# Patient Record
Sex: Female | Born: 1985 | Race: White | Hispanic: No | Marital: Single | Smoking: Current every day smoker
Health system: Southern US, Community
[De-identification: ages and names within clinical notes are randomized; demographics above are authoritative.]

## PROBLEM LIST (undated history)

## (undated) ENCOUNTER — Emergency Department: Payer: Medicaid Other | Source: Home / Self Care

## (undated) DIAGNOSIS — F329 Major depressive disorder, single episode, unspecified: Secondary | ICD-10-CM

## (undated) DIAGNOSIS — F32A Depression, unspecified: Secondary | ICD-10-CM

## (undated) DIAGNOSIS — F419 Anxiety disorder, unspecified: Secondary | ICD-10-CM

## (undated) DIAGNOSIS — T148XXA Other injury of unspecified body region, initial encounter: Secondary | ICD-10-CM

## (undated) DIAGNOSIS — G43909 Migraine, unspecified, not intractable, without status migrainosus: Secondary | ICD-10-CM

## (undated) DIAGNOSIS — F319 Bipolar disorder, unspecified: Secondary | ICD-10-CM

## (undated) HISTORY — DX: Depression, unspecified: F32.A

## (undated) HISTORY — DX: Bipolar disorder, unspecified: F31.9

## (undated) HISTORY — DX: Anxiety disorder, unspecified: F41.9

## (undated) HISTORY — DX: Major depressive disorder, single episode, unspecified: F32.9

## (undated) HISTORY — DX: Other injury of unspecified body region, initial encounter: T14.8XXA

## (undated) HISTORY — DX: Migraine, unspecified, not intractable, without status migrainosus: G43.909

---

## 2005-02-05 ENCOUNTER — Emergency Department: Payer: Self-pay | Admitting: General Practice

## 2009-09-01 ENCOUNTER — Emergency Department: Payer: Self-pay | Admitting: Internal Medicine

## 2009-10-17 ENCOUNTER — Emergency Department: Payer: Self-pay | Admitting: Emergency Medicine

## 2010-01-03 ENCOUNTER — Emergency Department: Payer: Self-pay | Admitting: Emergency Medicine
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
## 2010-01-05 ENCOUNTER — Emergency Department: Payer: Self-pay | Admitting: Internal Medicine

## 2011-02-06 ENCOUNTER — Emergency Department: Payer: Self-pay | Admitting: Emergency Medicine

## 2013-06-10 ENCOUNTER — Emergency Department: Payer: Self-pay | Admitting: Emergency Medicine
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
## 2014-04-03 ENCOUNTER — Inpatient Hospital Stay: Payer: Self-pay | Admitting: Psychiatry

## 2014-04-03 LAB — URINALYSIS, COMPLETE
BILIRUBIN, UR: NEGATIVE
BLOOD: NEGATIVE
Bacteria: NONE SEEN
Glucose,UR: NEGATIVE mg/dL (ref 0–75)
KETONE: NEGATIVE
LEUKOCYTE ESTERASE: NEGATIVE
Nitrite: NEGATIVE
PROTEIN: NEGATIVE
Ph: 5 (ref 4.5–8.0)
Specific Gravity: 1.016 (ref 1.003–1.030)
Squamous Epithelial: 1

## 2014-04-03 LAB — COMPREHENSIVE METABOLIC PANEL
ALBUMIN: 4.1 g/dL (ref 3.4–5.0)
ALT: 41 U/L
AST: 24 U/L (ref 15–37)
Alkaline Phosphatase: 113 U/L
Anion Gap: 8 (ref 7–16)
BUN: 7 mg/dL (ref 7–18)
Bilirubin,Total: 0.3 mg/dL (ref 0.2–1.0)
CO2: 26 mmol/L (ref 21–32)
CREATININE: 0.75 mg/dL (ref 0.60–1.30)
Calcium, Total: 8.5 mg/dL (ref 8.5–10.1)
Chloride: 106 mmol/L (ref 98–107)
EGFR (African American): >60
EGFR (Non-African Amer.): >60
GLUCOSE: 105 mg/dL — AB (ref 65–99)
OSMOLALITY: 278 (ref 275–301)
Potassium: 3.6 mmol/L (ref 3.5–5.1)
SODIUM: 140 mmol/L (ref 136–145)
TOTAL PROTEIN: 7.8 g/dL (ref 6.4–8.2)

## 2014-04-03 LAB — CBC
HCT: 43.4 % (ref 35.0–47.0)
HGB: 14.7 g/dL (ref 12.0–16.0)
MCH: 31.1 pg (ref 26.0–34.0)
MCHC: 33.8 g/dL (ref 32.0–36.0)
MCV: 92 fL (ref 80–100)
Platelet: 211 10*3/uL (ref 150–440)
RBC: 4.72 10*6/uL (ref 3.80–5.20)
RDW: 13.1 % (ref 11.5–14.5)
WBC: 8.4 10*3/uL (ref 3.6–11.0)

## 2014-04-03 LAB — DRUG SCREEN, URINE
Amphetamines, Ur Screen: NEGATIVE (ref ?–1000)
Barbiturates, Ur Screen: POSITIVE (ref ?–200)
Benzodiazepine, Ur Scrn: POSITIVE (ref ?–200)
CANNABINOID 50 NG, UR ~~LOC~~: NEGATIVE (ref ?–50)
Cocaine Metabolite,Ur ~~LOC~~: NEGATIVE (ref ?–300)
MDMA (Ecstasy)Ur Screen: NEGATIVE (ref ?–500)
METHADONE, UR SCREEN: NEGATIVE (ref ?–300)
Opiate, Ur Screen: POSITIVE (ref ?–300)
Phencyclidine (PCP) Ur S: NEGATIVE (ref ?–25)
Tricyclic, Ur Screen: NEGATIVE (ref ?–1000)

## 2014-04-03 LAB — SALICYLATE LEVEL: Salicylates, Serum: 4.4 mg/dL — ABNORMAL HIGH

## 2014-04-03 LAB — ETHANOL

## 2014-04-03 LAB — ACETAMINOPHEN LEVEL: Acetaminophen: <2 ug/mL
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
## 2015-01-22 ENCOUNTER — Encounter: Payer: Self-pay | Admitting: Obstetrics and Gynecology

## 2015-01-22 ENCOUNTER — Ambulatory Visit (INDEPENDENT_AMBULATORY_CARE_PROVIDER_SITE_OTHER): Payer: Self-pay | Admitting: Obstetrics and Gynecology

## 2015-01-22 VITALS — BP 105/70 | HR 89 | Resp 16 | Ht 64.0 in | Wt 157.9 lb

## 2015-01-22 DIAGNOSIS — T8332XA Displacement of intrauterine contraceptive device, initial encounter: Secondary | ICD-10-CM

## 2015-01-22 DIAGNOSIS — T8389XA Other specified complication of genitourinary prosthetic devices, implants and grafts, initial encounter: Secondary | ICD-10-CM

## 2015-01-22 NOTE — Progress Notes (Signed)
GYNECOLOGY PROGRESS NOTE  Subjective:    Patient ID: Leslie Molina, female    DOB: 1985/12/26, 29 y.o.   MRN: [id number]  HPI  Patient is a 29 y.o. P74 female who presents for establishment of care and IUD removal.  IUD was placed ~ 7 years ago (in [place]).  Reports h/o difficulty with insertion.  LMP unknown as patient with h/o sparsely occuring menses with IUD.  Denies alternative method of contraception once IUD removed and will maintain abstinence.     Past Medical History  Diagnosis Date  . Bipolar 1 disorder   . Anxiety   . Depression   . Migraine   . Nerve damage     Past Surgical History  Procedure Laterality Date  . Cesarean section (x 3)     Social History  Substance Use Topics  . Smoking status: Current Every Day Smoker -- 1.00 packs/day    Types: Cigarettes  . Smokeless tobacco: Not on file  . Alcohol Use: No   Outpatient Encounter Prescriptions as of 01/22/2015  Medication Sig Note  . ALPRAZolam (XANAX) 0.5 MG tablet Take 0.5 mg by mouth 3 (three) times daily as needed. 01/22/2015: Received from: External Pharmacy  . ALPRAZolam (XANAX) 1 MG tablet Take 1 mg by mouth 3 (three) times daily. 01/22/2015: Received from: External Pharmacy  . butalbital-acetaminophen-caffeine (FIORICET, ESGIC) 50-325-40 MG per tablet Take 1 tablet by mouth every 4 (four) hours as needed. 01/22/2015: Received from: External Pharmacy  . gabapentin (NEURONTIN) 300 MG capsule Take 300 mg by mouth 3 (three) times daily. 01/22/2015: Received from: External Pharmacy  . hydrOXYzine (VISTARIL) 50 MG capsule TAKE 2 CAPSULES EACH NIGHT AT BEDTIME 01/22/2015: Received from: External Pharmacy  . LATUDA 20 MG TABS Take 1 tablet by mouth daily. 01/22/2015: Received from: External Pharmacy  . QVAR 80 MCG/ACT inhaler INHALE 2 PUFFS EVERY 4 TO 6 HOURS AS NEEDED 01/22/2015: Received from: External Pharmacy  . SEROQUEL XR 150 MG 24 hr tablet Take 150 mg by mouth every evening. 01/22/2015: Received from: External  Pharmacy  . simvastatin (ZOCOR) 40 MG tablet Take 40 mg by mouth daily. 01/22/2015: Received from: External Pharmacy  . SPIRIVA HANDIHALER 18 MCG inhalation capsule INHALE 1 CAPSULE VIA HANDIHALER ONCE DAILY AT THE SAME TIME EVERY DAY 01/22/2015: Received from: External Pharmacy  . SUMAtriptan (IMITREX) 50 MG tablet Take 50 mg by mouth See admin instructions. 01/22/2015: Received from: External Pharmacy  . topiramate (TOPAMAX) 100 MG tablet TAKE 1 TABLET BY MOUTH AT BEDTIME FOR HEADACHE PREVENTION 01/22/2015: Received from: External Pharmacy  . traMADol (ULTRAM) 50 MG tablet Take 50 mg by mouth 2 (two) times daily as needed. 01/22/2015: Received from: External Pharmacy  . traZODone (DESYREL) 150 MG tablet Take 150 mg by mouth at bedtime. 01/22/2015: Received from: External Pharmacy   No facility-administered encounter medications on file as of 01/22/2015.    Allergies  Allergen Reactions  . Ibuprofen Hives    Review of Systems A comprehensive review of systems was negative except for: Integument/breast: positive for pruritus, rash and skin lesion(s)   Objective:   Blood pressure 105/70, pulse 89, resp. rate 16, height 5\' 4"  (1.626 m), weight 157 lb 14.4 oz (71.623 kg). General appearance: alert and no distress  Skin: excoriation - arm(s) bilateral, elbow(s) bilateral, knee(s) bilateral, lower leg(s) bilateral and hyperpigmentation - elbow(s) bilateral, knee(s) bilateral with areas of patchy, scaly skin and lichenification.  Abdomen: soft, non-tender; bowel sounds normal; no masses,  no organomegaly Pelvic: external genitalia normal  and vagina normal without discharge.  Cervix visible, no lesions.  No IUD strings identified.  Uterus normal shape, size, consistency.  Extremities: extremities normal, atraumatic, no cyanosis or edema Neurologic: Grossly normal   Assessment:   IUD threads lost Skin lesions (suspicious for psoriasis)  Plan:   Unable to retrieve IUD threads with Boseman  forceps, endocervical cytobrush, IUD retrieval hook with no success.  Will order pelvic ultrasound to locate IUD with strings.  Depending on location, may be able to remove under ultrasound guidance, otherwise may have to locate under hysteroscopic guidance. RTC in 1-2 weeks for ultrasound and MD visit.  Patient desires annual exam.  Will schedule for later date.     IUD Removal Procedure Note Patient identified, informed consent performed, verbal consent obtained.   Patient was in the dorsal lithotomy position, normal external genitalia was noted.  A speculum was placed in the patient's vagina, normal discharge was noted, no lesions. The cervix was visualized, no lesions, no abnormal discharge.  The strings of the IUD were The strings of the IUD were not visualized, so Boseman forceps were introduced into the endometrial cavity.  IUD strings still not retrieved, so successive attempts with the endocervical cytobrush and the IUD retreival hook were made.  IUD threads still not retrieved.  Procedure was discontinued at this time.  Patient tolerated the procedure without difficulty up to this point.      Rubie Maid, MD [facility]

## 2015-01-23 ENCOUNTER — Other Ambulatory Visit: Payer: Medicaid Other

## 2015-01-28 ENCOUNTER — Encounter: Payer: Self-pay | Admitting: *Deleted

## 2015-02-05 ENCOUNTER — Ambulatory Visit: Payer: Medicaid Other | Admitting: Obstetrics and Gynecology

## 2016-04-14 ENCOUNTER — Ambulatory Visit: Payer: Medicaid Other | Admitting: Obstetrics and Gynecology

## 2019-01-16 ENCOUNTER — Emergency Department
Admission: EM | Admit: 2019-01-16 | Discharge: 2019-01-16 | Disposition: A | Discharge status: Home / Self Care | Payer: Medicaid Other | Attending: Student in an Organized Health Care Education/Training Program | Admitting: Student in an Organized Health Care Education/Training Program

## 2019-01-16 ENCOUNTER — Encounter: Payer: Self-pay | Admitting: Emergency Medicine

## 2019-01-16 DIAGNOSIS — Y929 Unspecified place or not applicable: Secondary | ICD-10-CM | POA: Diagnosis not present

## 2019-01-16 DIAGNOSIS — S41112A Laceration without foreign body of left upper arm, initial encounter: Secondary | ICD-10-CM | POA: Insufficient documentation

## 2019-01-16 DIAGNOSIS — W548XXA Other contact with dog, initial encounter: Secondary | ICD-10-CM | POA: Diagnosis not present

## 2019-01-16 DIAGNOSIS — Y9389 Activity, other specified: Secondary | ICD-10-CM | POA: Insufficient documentation

## 2019-01-16 DIAGNOSIS — Y999 Unspecified external cause status: Secondary | ICD-10-CM | POA: Insufficient documentation

## 2019-01-16 DIAGNOSIS — Z5321 Procedure and treatment not carried out due to patient leaving prior to being seen by health care provider: Secondary | ICD-10-CM | POA: Insufficient documentation

## 2019-01-16 NOTE — ED Triage Notes (Signed)
Pt fell while playing with dogs this evening and a nail lacerated her left upper arm, approx. 3 inch. New bandage applied, no fatty tissue seen on assessment.

## 2019-01-30 ENCOUNTER — Other Ambulatory Visit: Payer: Self-pay

## 2019-01-30 ENCOUNTER — Emergency Department
Admission: EM | Admit: 2019-01-30 | Discharge: 2019-01-30 | Disposition: A | Discharge status: Home / Self Care | Payer: Medicaid Other | Attending: Emergency Medicine | Admitting: Emergency Medicine

## 2019-01-30 ENCOUNTER — Emergency Department: Payer: Medicaid Other

## 2019-01-30 DIAGNOSIS — Z79899 Other long term (current) drug therapy: Secondary | ICD-10-CM | POA: Diagnosis not present

## 2019-01-30 DIAGNOSIS — W208XXA Other cause of strike by thrown, projected or falling object, initial encounter: Secondary | ICD-10-CM | POA: Insufficient documentation

## 2019-01-30 DIAGNOSIS — S6991XA Unspecified injury of right wrist, hand and finger(s), initial encounter: Secondary | ICD-10-CM | POA: Diagnosis present

## 2019-01-30 DIAGNOSIS — Y990 Civilian activity done for income or pay: Secondary | ICD-10-CM | POA: Insufficient documentation

## 2019-01-30 DIAGNOSIS — Y939 Activity, unspecified: Secondary | ICD-10-CM | POA: Diagnosis not present

## 2019-01-30 DIAGNOSIS — S60221A Contusion of right hand, initial encounter: Secondary | ICD-10-CM | POA: Insufficient documentation

## 2019-01-30 DIAGNOSIS — Y929 Unspecified place or not applicable: Secondary | ICD-10-CM | POA: Insufficient documentation

## 2019-01-30 DIAGNOSIS — F17210 Nicotine dependence, cigarettes, uncomplicated: Secondary | ICD-10-CM | POA: Insufficient documentation

## 2019-01-30 IMAGING — DX DG HAND COMPLETE 3+V*R*
3 series · 3 of 3 positions shown · non-contrast
Comparison: [DATE]

CLINICAL DATA: Pain

EXAM:
RIGHT HAND - COMPLETE 3+ VIEW

[hand ap]
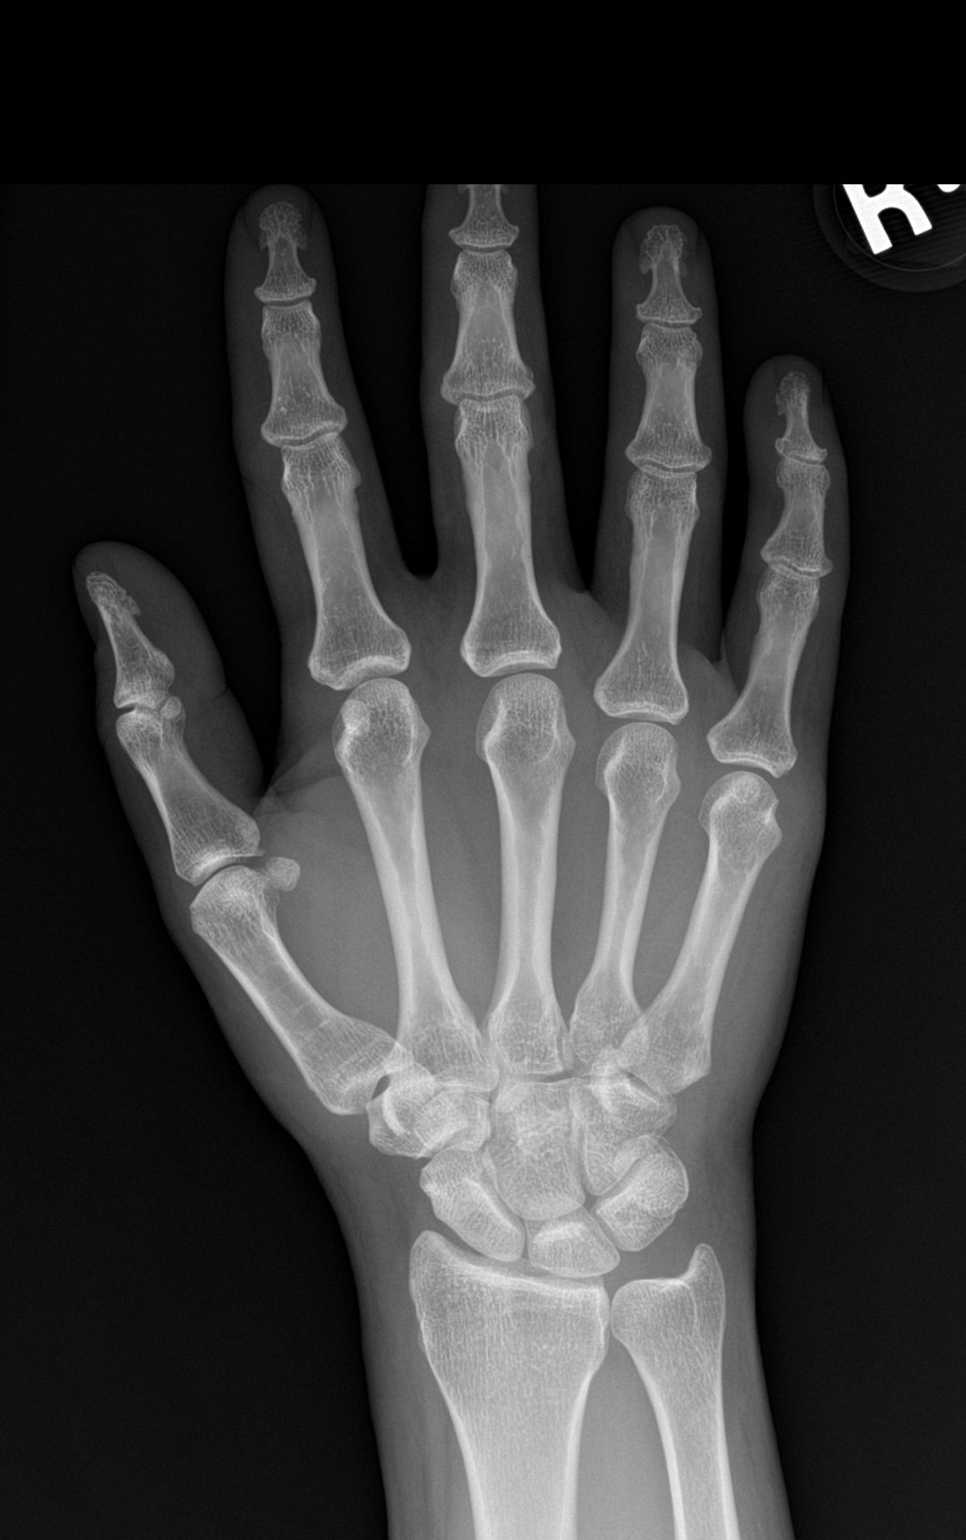

[hand obl]
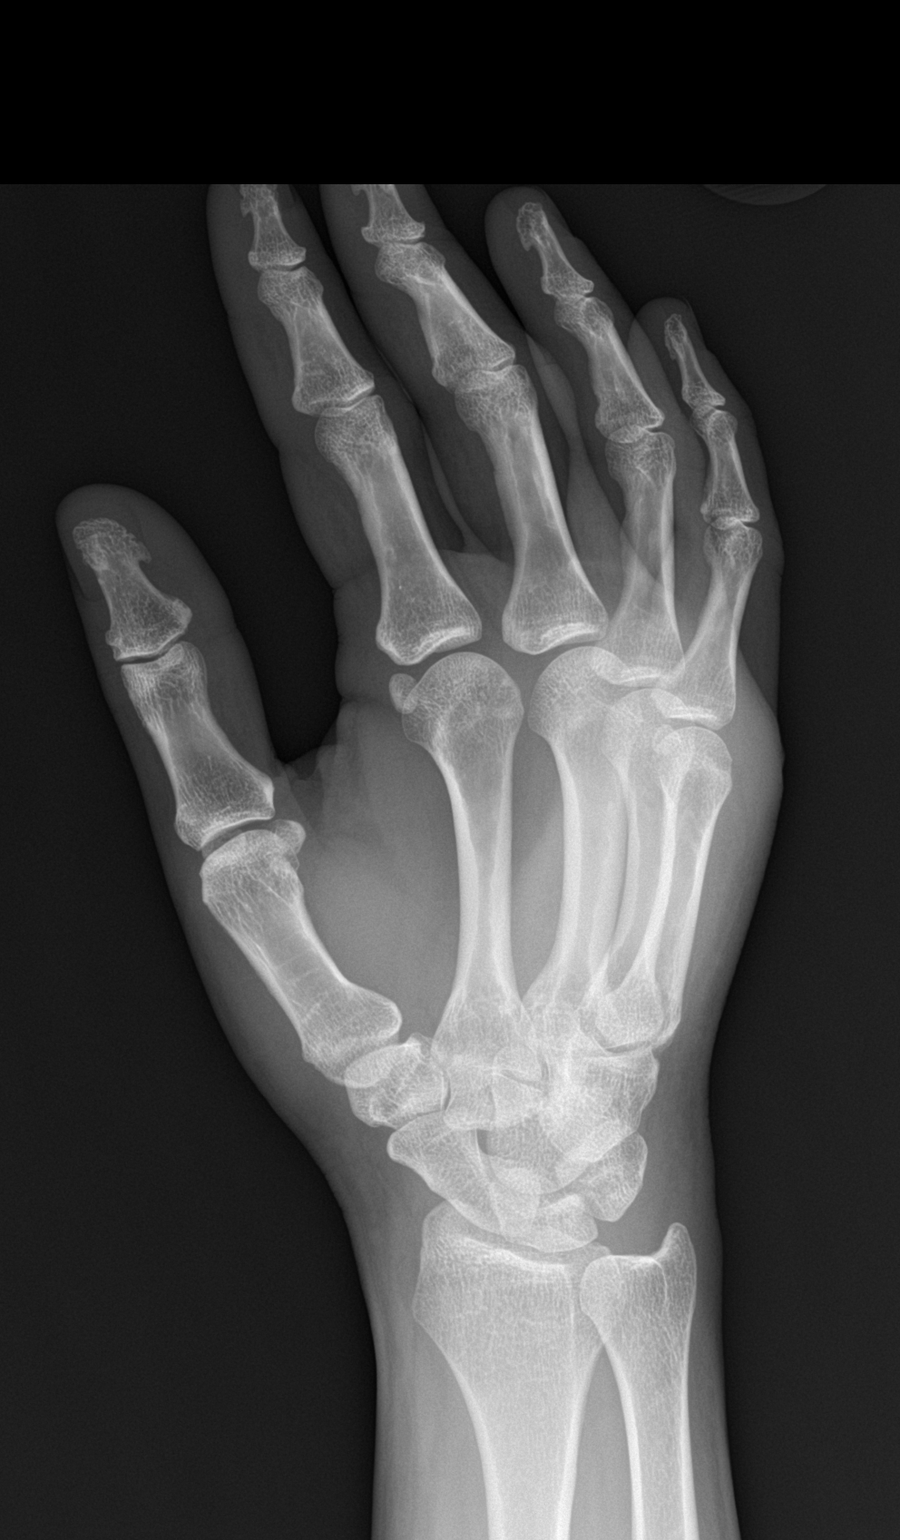

[hand lat]
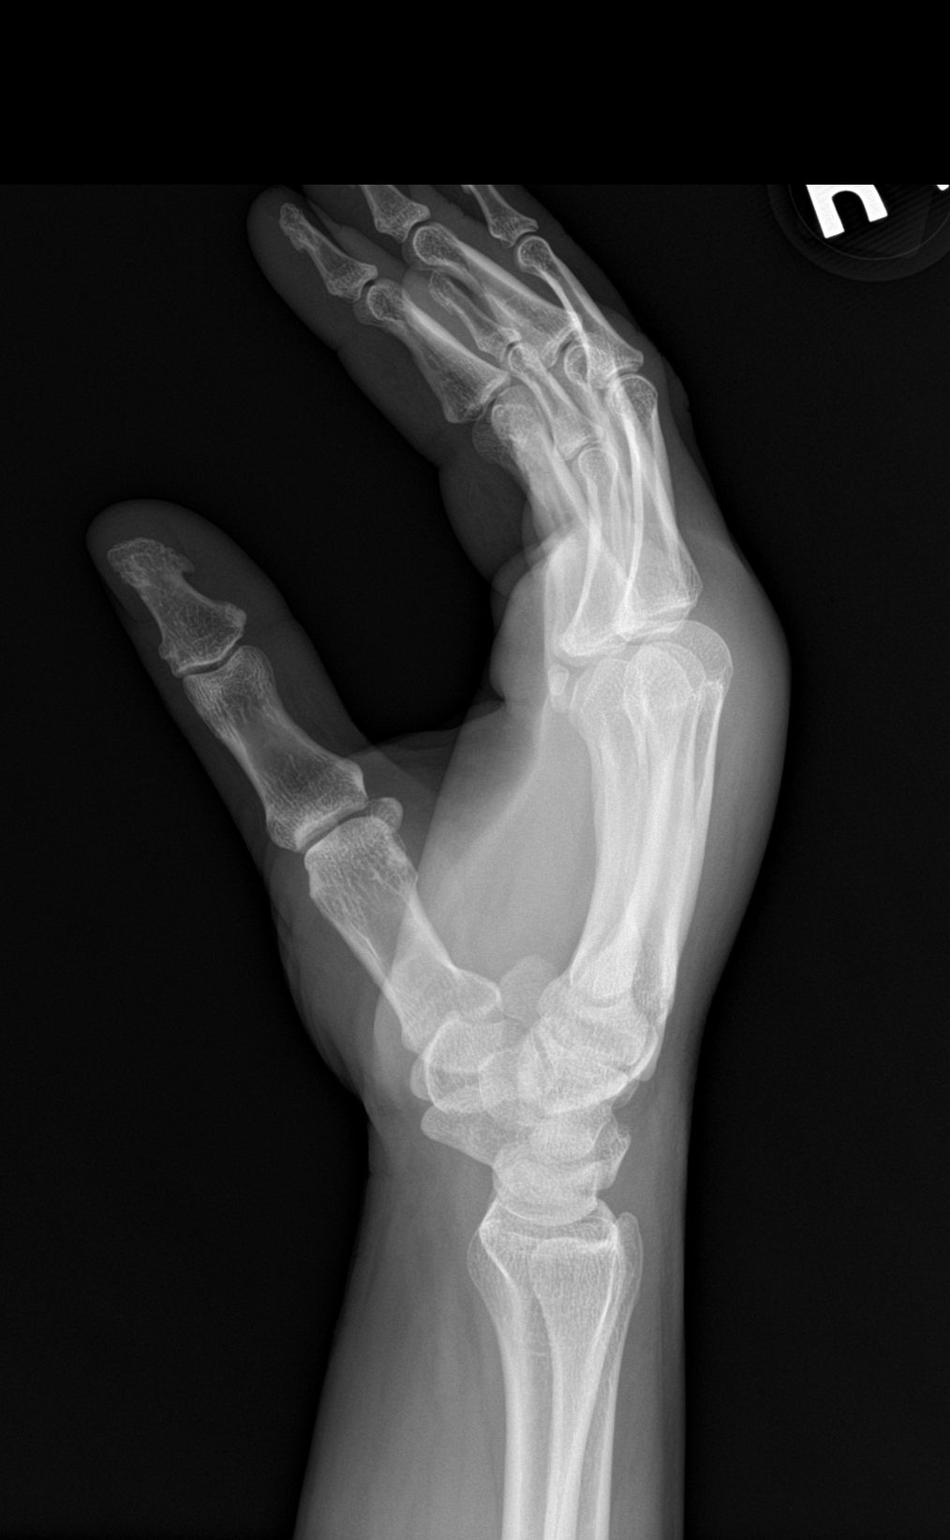

[3 of 3 positions shown; findings below may reference images not displayed]

FINDINGS: There is no acute displaced fracture or dislocation. There is soft
tissue swelling about the dorsal aspect of the hand. There is no
radiopaque foreign body.
IMPRESSION: Soft tissue swelling without evidence for an acute displaced
fracture or dislocation.

## 2019-01-30 MED ORDER — HYDROCODONE-ACETAMINOPHEN 5-325 MG PO TABS
1.0000 | ORAL_TABLET | Freq: Once | ORAL | Status: AC
  Administered 2019-01-30: 1 via ORAL
  Filled 2019-01-30: qty 1

## 2019-01-30 MED ORDER — PREDNISONE 20 MG PO TABS: 20.0000 mg | ORAL_TABLET | Freq: Two times a day (BID) | ORAL | 0 refills | Status: AC

## 2019-01-30 MED ORDER — HYDROCODONE-ACETAMINOPHEN 5-325 MG PO TABS
1.0000 | ORAL_TABLET | Freq: Three times a day (TID) | ORAL | 0 refills | Status: AC | PRN
Start: 2019-01-30 — End: 2019-02-01

## 2019-01-30 NOTE — ED Notes (Signed)
See triage note  Presents with pain to right hand  States she dropped something on hand  No deformity noted

## 2019-01-30 NOTE — Discharge Instructions (Signed)
You have a hand contusion without fracture or dislocation. Wear the ace bandage for protection and comfort. Apply ice to reduce swelling. Take the prescription anti-inflammatory for pain and inflammation.

## 2019-01-30 NOTE — ED Triage Notes (Signed)
Reports that she dropped approx 20 lbs on right hand today. Swelling and pain to right hand present. Pt alert and oriented X4, cooperative, RR even and unlabored, color WNL. Pt in NAD.

## 2019-01-30 NOTE — ED Provider Notes (Signed)
[facility] Emergency Department Provider Note ____________________________________________  Time seen: 1655  I have reviewed the triage vital signs and the nursing notes.  HISTORY  Chief Complaint  Hand Injury  HPI Leslie Molina is a 33 y.o. female presents with self to the ED for evaluation and pain.  She describes dropping a 20-pound box of frozen french fries onto the right hand today.  She continued to work after the injury. She presents with pain and swelling to the hand.  She denies any other injury at this time.   Past Medical History:  Diagnosis Date  . Anxiety   . Bipolar 1 disorder ([place])   . Depression   . Migraine   . Nerve damage     Patient Active Problem List   Diagnosis Date Noted  . IUD migration ([place]) 01/22/2015    Past Surgical History:  Procedure Laterality Date  . CESAREAN SECTION      Prior to Admission medications   Medication Sig Start Date End Date Taking? Authorizing Provider  ALPRAZolam Duanne Moron) 0.5 MG tablet Take 0.5 mg by mouth 3 (three) times daily as needed. 12/26/14   [provider]  ALPRAZolam Duanne Moron) 1 MG tablet Take 1 mg by mouth 3 (three) times daily. 10/24/14   [provider]  butalbital-acetaminophen-caffeine (FIORICET, ESGIC) 50-325-40 MG per tablet Take 1 tablet by mouth every 4 (four) hours as needed. 12/26/14   [provider]  gabapentin (NEURONTIN) 300 MG capsule Take 300 mg by mouth 3 (three) times daily. 01/14/15   [provider]  HYDROcodone-acetaminophen (NORCO) 5-325 MG tablet Take 1 tablet by mouth 3 (three) times daily as needed for up to 2 days. 01/30/19 02/01/19  Alexsis Branscom, Dannielle Karvonen, PA-C  hydrOXYzine (VISTARIL) 50 MG capsule TAKE 2 CAPSULES EACH NIGHT AT BEDTIME 12/08/14   [provider]  LATUDA 20 MG TABS Take 1 tablet by mouth daily. 12/26/14   [provider]  predniSONE (DELTASONE) 20 MG tablet Take 1 tablet (20 mg total) by mouth 2 (two) times  daily with a meal for 5 days. 01/30/19 02/04/19  Everley Evora, Dannielle Karvonen, PA-C  QVAR 80 MCG/ACT inhaler INHALE 2 PUFFS EVERY 4 TO 6 HOURS AS NEEDED 12/08/14   [provider]  SEROQUEL XR 150 MG 24 hr tablet Take 150 mg by mouth every evening. 12/27/14   [provider]  simvastatin (ZOCOR) 40 MG tablet Take 40 mg by mouth daily. 12/08/14   [provider]  SPIRIVA HANDIHALER 18 MCG inhalation capsule INHALE 1 CAPSULE VIA HANDIHALER ONCE DAILY AT THE SAME TIME EVERY DAY 12/08/14   [provider]  SUMAtriptan (IMITREX) 50 MG tablet Take 50 mg by mouth See admin instructions. 11/29/14   [provider]  topiramate (TOPAMAX) 100 MG tablet TAKE 1 TABLET BY MOUTH AT BEDTIME FOR HEADACHE PREVENTION 12/08/14   [provider]  traMADol (ULTRAM) 50 MG tablet Take 50 mg by mouth 2 (two) times daily as needed. 12/26/14   [provider]  traZODone (DESYREL) 150 MG tablet Take 150 mg by mouth at bedtime. 12/27/14   [provider]    Allergies Ibuprofen  Family History  Problem Relation Age of Onset  . Depression Mother   . Bipolar disorder Mother   . Depression Father   . Bipolar disorder Father   . Depression Sister   . Bipolar disorder Sister     Social History Social History   Tobacco Use  . Smoking status: Current  Every Day Smoker    Packs/day: 1.00    Types: Cigarettes  . Smokeless tobacco: Never Used  Substance Use Topics  . Alcohol use: No    Alcohol/week: 0.0 standard drinks  . Drug use: No    Review of Systems  Constitutional: Negative for fever. Cardiovascular: Negative for chest pain. Respiratory: Negative for shortness of breath. Musculoskeletal: Negative for back pain.  Right hand pain as above. Skin: Negative for rash. Neurological: Negative for headaches, focal weakness or numbness. ____________________________________________  PHYSICAL EXAM:  VITAL SIGNS: ED Triage Vitals  Enc Vitals Group     BP  01/30/19 1632 116/78     Pulse Rate 01/30/19 1632 91     Resp --      Temp 01/30/19 1632 98.4 F (36.9 C)     Temp Source 01/30/19 1632 Oral     SpO2 01/30/19 1632 96 %     Weight 01/30/19 1633 167 lb (75.8 kg)     Height 01/30/19 1633 5\' 1"  (1.549 m)     Head Circumference --      Peak Flow --      Pain Score 01/30/19 1637 10     Pain Loc --      Pain Edu? --      Excl. in [place]? --     Constitutional: Alert and oriented. Well appearing and in no distress. Head: Normocephalic and atraumatic. Eyes: Conjunctivae are normal. Normal extraocular movements Cardiovascular: Normal rate, regular rhythm. Normal distal pulses. Respiratory: Normal respiratory effort. No wheezes/rales/rhonchi. Gastrointestinal: Soft and nontender. No distention. Musculoskeletal: Right hand with obvious dorsal soft tissue swelling noted.  No overlying abrasion or laceration is appreciated.  Patient with normal composite fist on exam.  Flexion of the fingers does elicit some pain.  Nontender with normal range of motion in all extremities.  Neurologic:  Normal gross sensation.  Normal intrinsic and opposition testing noted.  Normal speech and language. No gross focal neurologic deficits are appreciated. Skin:  Skin is warm, dry and intact. No rash noted. ____________________________________________   RADIOLOGY  DG Right Hand  negative ____________________________________________  PROCEDURES  Norco 5-325 mg PO Ace bandage Procedures ____________________________________________  INITIAL IMPRESSION / ASSESSMENT AND PLAN / ED COURSE  Patient with ED evaluation of right hand pain after a 20 pound box of frozen [place] fries fell on the hand.  Patient clinical picture is consistent with a hand contusion with soft tissue swelling and probable hematoma noted dorsally.  X-rays are negative for any acute fractures or dislocation.  The right hand is Ace bandage for support and protection.  Patient is discharged with  prescriptions for ibuprofen and #6 hydrocodone.  A work note is provided for 1 day as requested.  Leslie Molina was evaluated in Emergency Department on 01/30/2019 for the symptoms described in the history of present illness. She was evaluated in the context of the global COVID-19 pandemic, which necessitated consideration that the patient might be at risk for infection with the SARS-CoV-2 virus that causes COVID-19. Institutional protocols and algorithms that pertain to the evaluation of patients at risk for COVID-19 are in a state of rapid change based on information released by regulatory bodies including the CDC and federal and state organizations. These policies and algorithms were followed during the patient's care in the ED. ____________________________________________  FINAL CLINICAL IMPRESSION(S) / ED DIAGNOSES  Final diagnoses:  Contusion of right hand, initial encounter      Melvenia Needles, PA-C 01/30/19 1914  Carrie Mew, MD 01/31/19 [phone]

## 2019-07-29 ENCOUNTER — Other Ambulatory Visit: Payer: Self-pay

## 2019-07-29 ENCOUNTER — Emergency Department
Admission: EM | Admit: 2019-07-29 | Discharge: 2019-07-29 | Disposition: A | Discharge status: Home / Self Care | Payer: Medicaid Other | Attending: Emergency Medicine | Admitting: Emergency Medicine

## 2019-07-29 DIAGNOSIS — M5441 Lumbago with sciatica, right side: Secondary | ICD-10-CM | POA: Diagnosis not present

## 2019-07-29 DIAGNOSIS — Z79899 Other long term (current) drug therapy: Secondary | ICD-10-CM | POA: Insufficient documentation

## 2019-07-29 DIAGNOSIS — Z1152 Encounter for screening for COVID-19: Secondary | ICD-10-CM

## 2019-07-29 DIAGNOSIS — G8929 Other chronic pain: Secondary | ICD-10-CM

## 2019-07-29 DIAGNOSIS — F17210 Nicotine dependence, cigarettes, uncomplicated: Secondary | ICD-10-CM | POA: Diagnosis not present

## 2019-07-29 DIAGNOSIS — B349 Viral infection, unspecified: Secondary | ICD-10-CM | POA: Diagnosis not present

## 2019-07-29 DIAGNOSIS — Z20822 Contact with and (suspected) exposure to covid-19: Secondary | ICD-10-CM | POA: Insufficient documentation

## 2019-07-29 DIAGNOSIS — M545 Low back pain: Secondary | ICD-10-CM | POA: Diagnosis present

## 2019-07-29 LAB — URINALYSIS, COMPLETE (UACMP) WITH MICROSCOPIC
Bacteria, UA: NONE SEEN
Bilirubin Urine: NEGATIVE
Glucose, UA: NEGATIVE mg/dL
Hgb urine dipstick: NEGATIVE
Ketones, ur: NEGATIVE mg/dL
Leukocytes,Ua: NEGATIVE
Nitrite: NEGATIVE
Protein, ur: NEGATIVE mg/dL
Specific Gravity, Urine: 1.008 (ref 1.005–1.030)
pH: 6 (ref 5.0–8.0)

## 2019-07-29 LAB — SARS CORONAVIRUS 2 (TAT 6-24 HRS): SARS Coronavirus 2: NEGATIVE

## 2019-07-29 MED ORDER — TRAMADOL HCL 50 MG PO TABS: 50.0000 mg | ORAL_TABLET | Freq: Four times a day (QID) | ORAL | 2 refills | Status: DC | PRN

## 2019-07-29 MED ORDER — ACETAMINOPHEN 500 MG PO TABS
1000.0000 mg | ORAL_TABLET | Freq: Once | ORAL | Status: AC
  Administered 2019-07-29: 10:00:00 1000 mg via ORAL
  Filled 2019-07-29: qty 2

## 2019-07-29 MED ORDER — GABAPENTIN 300 MG PO CAPS: 300.0000 mg | ORAL_CAPSULE | Freq: Three times a day (TID) | ORAL | 1 refills | Status: DC

## 2019-07-29 MED ORDER — OXYCODONE HCL 5 MG PO TABS
5.0000 mg | ORAL_TABLET | Freq: Once | ORAL | Status: AC
  Administered 2019-07-29: 11:00:00 5 mg via ORAL
  Filled 2019-07-29: qty 1

## 2019-07-29 NOTE — Discharge Instructions (Signed)
Follow-up with your primary care or establish a primary care provider as you indicated that you were trying to do.  A list of other clinics was added to your discharge instructions.  A prescription for gabapentin and tramadol was sent to your pharmacy.  Also take Tylenol as needed for fever.  Quarantine until you have heard the results of your Covid testing.  If this test is positive you will need to quarantine additional 10 days.  Increase fluids.

## 2019-07-29 NOTE — ED Triage Notes (Signed)
Pt arrived via EMS from home d/t lower back pain that radiates down her right leg. Pt reports hx of lower back pain but this is a lot different and more painful. Pt is A&Om x4 at this time.

## 2019-07-29 NOTE — ED Provider Notes (Signed)
[facility] Emergency Department Provider Note  ____________________________________________   First MD Initiated Contact with Patient 07/29/19 [phone]     (approximate)  I have reviewed the triage vital signs and the nursing notes.   HISTORY  Chief Complaint Back Pain   HPI Leslie Molina is a 34 y.o. female presents to the ED via EMS with complaint of low back pain without history of injury.  Patient has a history of chronic low back pain.  She states that she is having some radiation into her right lower extremity and that her pain is worse today.  She denies any incontinence of bowel or bladder or saddle anesthesias.  Patient was not aware that she had a fever.  Initially her temperature was 100.9.  She denies any exposure to Covid.  She denies any upper respiratory symptoms, cough, chills, nausea, vomiting or diarrhea.  Patient denies any change in taste or smell.  She does report some urinary frequency.  Currently she rates her pain as a 10/10.     Past Medical History:  Diagnosis Date  . Anxiety   . Bipolar 1 disorder ([place])   . Depression   . Migraine   . Nerve damage     Patient Active Problem List   Diagnosis Date Noted  . IUD migration 01/22/2015    Past Surgical History:  Procedure Laterality Date  . CESAREAN SECTION      Prior to Admission medications   Medication Sig Start Date End Date Taking? Authorizing Provider  ALPRAZolam Duanne Moron) 0.5 MG tablet Take 0.5 mg by mouth 3 (three) times daily as needed. 12/26/14   [provider]  ALPRAZolam Duanne Moron) 1 MG tablet Take 1 mg by mouth 3 (three) times daily. 10/24/14   [provider]  gabapentin (NEURONTIN) 300 MG capsule Take 1 capsule (300 mg total) by mouth 3 (three) times daily. 07/29/19   Johnn Hai, PA-C  hydrOXYzine (VISTARIL) 50 MG capsule TAKE 2 CAPSULES EACH NIGHT AT BEDTIME 12/08/14   [provider]  LATUDA 20 MG TABS Take 1 tablet by mouth daily. 12/26/14    [provider]  QVAR 80 MCG/ACT inhaler INHALE 2 PUFFS EVERY 4 TO 6 HOURS AS NEEDED 12/08/14   [provider]  SEROQUEL XR 150 MG 24 hr tablet Take 150 mg by mouth every evening. 12/27/14   [provider]  simvastatin (ZOCOR) 40 MG tablet Take 40 mg by mouth daily. 12/08/14   [provider]  SPIRIVA HANDIHALER 18 MCG inhalation capsule INHALE 1 CAPSULE VIA HANDIHALER ONCE DAILY AT THE SAME TIME EVERY DAY 12/08/14   [provider]  SUMAtriptan (IMITREX) 50 MG tablet Take 50 mg by mouth See admin instructions. 11/29/14   [provider]  topiramate (TOPAMAX) 100 MG tablet TAKE 1 TABLET BY MOUTH AT BEDTIME FOR HEADACHE PREVENTION 12/08/14   [provider]  traMADol (ULTRAM) 50 MG tablet Take 1 tablet (50 mg total) by mouth every 6 (six) hours as needed for moderate pain. 07/29/19   Johnn Hai, PA-C  traZODone (DESYREL) 150 MG tablet Take 150 mg by mouth at bedtime. 12/27/14   [provider]    Allergies Ibuprofen  Family History  Problem Relation Age of Onset  . Depression Mother   . Bipolar disorder Mother   . Depression Father   . Bipolar disorder Father   . Depression Sister   . Bipolar disorder Sister     Social History Social History  Tobacco Use  . Smoking status: Current Every Day Smoker    Packs/day: 1.00    Types: Cigarettes  . Smokeless tobacco: Never Used  Substance Use Topics  . Alcohol use: No    Alcohol/week: 0.0 standard drinks  . Drug use: No    Review of Systems Constitutional: Unaware of fever/chills Eyes: No visual changes. ENT: No sore throat. Cardiovascular: Denies chest pain. Respiratory: Denies shortness of breath.  Denies cough. Gastrointestinal: No abdominal pain.  No nausea, no vomiting.  No diarrhea.  No constipation. Genitourinary: Positive for urinary frequency. Musculoskeletal: Positive for low back pain with right leg radiculopathy. Skin: Negative for  rash. Neurological: Negative for headaches, focal weakness or numbness. ____________________________________________   PHYSICAL EXAM:  VITAL SIGNS: ED Triage Vitals  Enc Vitals Group     BP 07/29/19 0917 102/68     Pulse Rate 07/29/19 0917 (!) 108     Resp 07/29/19 0917 20     Temp 07/29/19 0917 (!) 100.9 F (38.3 C)     Temp Source 07/29/19 0917 Oral     SpO2 07/29/19 0917 100 %     Weight 07/29/19 0919 145 lb (65.8 kg)     Height 07/29/19 0919 5\' 1"  (1.549 m)     Head Circumference --      Peak Flow --      Pain Score 07/29/19 0919 10     Pain Loc --      Pain Edu? --      Excl. in [place]? --    Constitutional: Alert and oriented. Well appearing and in no acute distress. Eyes: Conjunctivae are normal.  Head: Atraumatic. Neck: No stridor.   Cardiovascular: Normal rate, regular rhythm. Grossly normal heart sounds.  Good peripheral circulation. Respiratory: Normal respiratory effort.  No retractions. Lungs CTAB. Gastrointestinal: Soft and nontender. No distention. No CVA tenderness. Musculoskeletal: There is no point tenderness on palpation of the thoracic or lumbar spine.  There is however moderate tenderness on palpation of the right SI joint area which reproduces her pain.  Patient has guarded movement but is able to stand and ambulate with minimal assistance.  Muscle strength bilaterally. Neurologic:  Normal speech and language. No gross focal neurologic deficits are appreciated.  Skin:  Skin is warm, dry and intact. No rash noted. Psychiatric: Mood and affect are normal. Speech and behavior are normal.  ____________________________________________   LABS (all labs ordered are listed, but only abnormal results are displayed)  Labs Reviewed  URINALYSIS, COMPLETE (UACMP) WITH MICROSCOPIC - Abnormal; Notable for the following components:      Result Value   Color, Urine YELLOW (*)    APPearance HAZY (*)    All other components within normal limits  SARS CORONAVIRUS 2 (TAT  6-24 HRS)    PROCEDURES  Procedure(s) performed (including Critical Care):  Procedures   ____________________________________________   INITIAL IMPRESSION / ASSESSMENT AND PLAN / ED COURSE  As part of my medical decision making, I reviewed the following data within the electronic MEDICAL RECORD NUMBER Notes from prior ED visits and [place] Controlled Substance Database  34 year old female presents to the ED via EMS with reports of low back pain with radiation into her right lower extremity.  Patient denied any incontinence of bowel or bladder and no saddle anesthesias.  Patient has been treated with gabapentin in the past and states that 1 day after finishing her last gabapentin she began having symptoms.  She states that she needs to get a new PCP.  Urinalysis was negative and patient was made aware that this most likely is her chronic back pain.  She does however have a temperature that is unexplained.  A Covid swab was done prior to patient's discharge.  He is aware that she needs to quarantine until she receives the results of this testing quarantine an additional 10 days if this test is positive.  A list of clinics was listed on her discharge papers to help her look for a new PCP.  She is to return to the emergency department if any severe worsening of her symptoms or difficulty breathing.  ____________________________________________   FINAL CLINICAL IMPRESSION(S) / ED DIAGNOSES  Final diagnoses:  Viral illness  Chronic right-sided low back pain with right-sided sciatica  Encounter for screening for COVID-19     ED Discharge Orders         Ordered    gabapentin (NEURONTIN) 300 MG capsule  3 times daily     07/29/19 1056    traMADol (ULTRAM) 50 MG tablet  Every 6 hours PRN     07/29/19 1056           Note:  This document was prepared using Dragon voice recognition software and may include unintentional dictation errors.    Johnn Hai, PA-C 07/29/19 1145    Lavonia Drafts, MD 07/29/19 1346

## 2019-08-02 ENCOUNTER — Emergency Department: Payer: Medicaid Other

## 2019-08-02 ENCOUNTER — Other Ambulatory Visit: Payer: Self-pay

## 2019-08-02 ENCOUNTER — Inpatient Hospital Stay
Admission: EM | Admit: 2019-08-02 | Discharge: 2019-08-03 | DRG: 853 | Disposition: A | Discharge status: Other Acute Inpatient Hospital | Payer: Medicaid Other | Attending: Internal Medicine | Admitting: Internal Medicine

## 2019-08-02 ENCOUNTER — Encounter: Payer: Self-pay | Admitting: Emergency Medicine

## 2019-08-02 DIAGNOSIS — F319 Bipolar disorder, unspecified: Secondary | ICD-10-CM | POA: Diagnosis present

## 2019-08-02 DIAGNOSIS — F17210 Nicotine dependence, cigarettes, uncomplicated: Secondary | ICD-10-CM | POA: Diagnosis present

## 2019-08-02 DIAGNOSIS — Z20822 Contact with and (suspected) exposure to covid-19: Secondary | ICD-10-CM | POA: Diagnosis present

## 2019-08-02 DIAGNOSIS — E871 Hypo-osmolality and hyponatremia: Secondary | ICD-10-CM | POA: Diagnosis present

## 2019-08-02 DIAGNOSIS — Z79899 Other long term (current) drug therapy: Secondary | ICD-10-CM | POA: Diagnosis not present

## 2019-08-02 DIAGNOSIS — E861 Hypovolemia: Secondary | ICD-10-CM | POA: Diagnosis present

## 2019-08-02 DIAGNOSIS — Z818 Family history of other mental and behavioral disorders: Secondary | ICD-10-CM

## 2019-08-02 DIAGNOSIS — F419 Anxiety disorder, unspecified: Secondary | ICD-10-CM | POA: Diagnosis present

## 2019-08-02 DIAGNOSIS — A4102 Sepsis due to Methicillin resistant Staphylococcus aureus: Principal | ICD-10-CM | POA: Diagnosis present

## 2019-08-02 DIAGNOSIS — A483 Toxic shock syndrome: Secondary | ICD-10-CM | POA: Diagnosis present

## 2019-08-02 DIAGNOSIS — A419 Sepsis, unspecified organism: Secondary | ICD-10-CM

## 2019-08-02 DIAGNOSIS — K6812 Psoas muscle abscess: Secondary | ICD-10-CM | POA: Diagnosis present

## 2019-08-02 DIAGNOSIS — M609 Myositis, unspecified: Secondary | ICD-10-CM | POA: Diagnosis present

## 2019-08-02 DIAGNOSIS — Z7951 Long term (current) use of inhaled steroids: Secondary | ICD-10-CM | POA: Diagnosis not present

## 2019-08-02 DIAGNOSIS — M5441 Lumbago with sciatica, right side: Secondary | ICD-10-CM | POA: Diagnosis present

## 2019-08-02 DIAGNOSIS — M461 Sacroiliitis, not elsewhere classified: Secondary | ICD-10-CM | POA: Diagnosis present

## 2019-08-02 DIAGNOSIS — K6819 Other retroperitoneal abscess: Secondary | ICD-10-CM | POA: Diagnosis present

## 2019-08-02 DIAGNOSIS — M4628 Osteomyelitis of vertebra, sacral and sacrococcygeal region: Secondary | ICD-10-CM | POA: Diagnosis not present

## 2019-08-02 DIAGNOSIS — M533 Sacrococcygeal disorders, not elsewhere classified: Secondary | ICD-10-CM

## 2019-08-02 DIAGNOSIS — E876 Hypokalemia: Secondary | ICD-10-CM | POA: Diagnosis present

## 2019-08-02 DIAGNOSIS — Z975 Presence of (intrauterine) contraceptive device: Secondary | ICD-10-CM

## 2019-08-02 DIAGNOSIS — M869 Osteomyelitis, unspecified: Secondary | ICD-10-CM | POA: Diagnosis present

## 2019-08-02 DIAGNOSIS — M6008 Infective myositis, other site: Secondary | ICD-10-CM | POA: Diagnosis present

## 2019-08-02 DIAGNOSIS — M25551 Pain in right hip: Secondary | ICD-10-CM

## 2019-08-02 DIAGNOSIS — Z452 Encounter for adjustment and management of vascular access device: Secondary | ICD-10-CM

## 2019-08-02 LAB — CBC WITH DIFFERENTIAL/PLATELET
Abs Immature Granulocytes: 0.1 10*3/uL — ABNORMAL HIGH (ref 0.00–0.07)
Basophils Absolute: 0.1 10*3/uL (ref 0.0–0.1)
Basophils Relative: 1 %
Eosinophils Absolute: 0 10*3/uL (ref 0.0–0.5)
Eosinophils Relative: 0 %
HCT: 33.7 % — ABNORMAL LOW (ref 36.0–46.0)
Hemoglobin: 11.8 g/dL — ABNORMAL LOW (ref 12.0–15.0)
Immature Granulocytes: 1 %
Lymphocytes Relative: 5 %
Lymphs Abs: 0.7 10*3/uL (ref 0.7–4.0)
MCH: 28.7 pg (ref 26.0–34.0)
MCHC: 35 g/dL (ref 30.0–36.0)
MCV: 82 fL (ref 80.0–100.0)
Monocytes Absolute: 0.8 10*3/uL (ref 0.1–1.0)
Monocytes Relative: 5 %
Neutro Abs: 12.3 10*3/uL — ABNORMAL HIGH (ref 1.7–7.7)
Neutrophils Relative %: 88 %
Platelets: 304 10*3/uL (ref 150–400)
RBC: 4.11 MIL/uL (ref 3.87–5.11)
RDW: 13.6 % (ref 11.5–15.5)
WBC: 14 10*3/uL — ABNORMAL HIGH (ref 4.0–10.5)
nRBC: 0 % (ref 0.0–0.2)

## 2019-08-02 LAB — URINALYSIS, COMPLETE (UACMP) WITH MICROSCOPIC
Bilirubin Urine: NEGATIVE
Glucose, UA: NEGATIVE mg/dL
Hgb urine dipstick: NEGATIVE
Ketones, ur: NEGATIVE mg/dL
Leukocytes,Ua: NEGATIVE
Nitrite: NEGATIVE
Protein, ur: NEGATIVE mg/dL
Specific Gravity, Urine: 1.008 (ref 1.005–1.030)
pH: 6 (ref 5.0–8.0)

## 2019-08-02 LAB — RESPIRATORY PANEL BY RT PCR (FLU A&B, COVID)
Influenza A by PCR: NEGATIVE
Influenza B by PCR: NEGATIVE
SARS Coronavirus 2 by RT PCR: NEGATIVE

## 2019-08-02 LAB — COMPREHENSIVE METABOLIC PANEL
ALT: 24 U/L (ref 0–44)
AST: 30 U/L (ref 15–41)
Albumin: 2.9 g/dL — ABNORMAL LOW (ref 3.5–5.0)
Alkaline Phosphatase: 103 U/L (ref 38–126)
Anion gap: 15 (ref 5–15)
BUN: 11 mg/dL (ref 6–20)
CO2: 24 mmol/L (ref 22–32)
Calcium: 8.4 mg/dL — ABNORMAL LOW (ref 8.9–10.3)
Chloride: 89 mmol/L — ABNORMAL LOW (ref 98–111)
Creatinine, Ser: 0.6 mg/dL (ref 0.44–1.00)
GFR calc Af Amer: >60 mL/min (ref >60)
GFR calc non Af Amer: >60 mL/min (ref >60)
Glucose, Bld: 133 mg/dL — ABNORMAL HIGH (ref 70–99)
Potassium: 3 mmol/L — ABNORMAL LOW (ref 3.5–5.1)
Sodium: 128 mmol/L — ABNORMAL LOW (ref 135–145)
Total Bilirubin: 0.5 mg/dL (ref 0.3–1.2)
Total Protein: 7.2 g/dL (ref 6.5–8.1)

## 2019-08-02 LAB — URINE DRUG SCREEN, QUALITATIVE (ARMC ONLY)
Amphetamines, Ur Screen: NOT DETECTED
Barbiturates, Ur Screen: NOT DETECTED
Benzodiazepine, Ur Scrn: NOT DETECTED
Cannabinoid 50 Ng, Ur ~~LOC~~: NOT DETECTED
Cocaine Metabolite,Ur ~~LOC~~: NOT DETECTED
MDMA (Ecstasy)Ur Screen: NOT DETECTED
Methadone Scn, Ur: NOT DETECTED
Opiate, Ur Screen: NOT DETECTED
Phencyclidine (PCP) Ur S: NOT DETECTED
Tricyclic, Ur Screen: POSITIVE — AB

## 2019-08-02 LAB — GLUCOSE, CAPILLARY: Glucose-Capillary: 127 mg/dL — ABNORMAL HIGH (ref 70–99)

## 2019-08-02 LAB — CK: Total CK: 410 U/L — ABNORMAL HIGH (ref 38–234)

## 2019-08-02 LAB — C-REACTIVE PROTEIN: CRP: 39 mg/dL — ABNORMAL HIGH (ref <1.0)

## 2019-08-02 LAB — POCT PREGNANCY, URINE: Preg Test, Ur: NEGATIVE

## 2019-08-02 LAB — LACTIC ACID, PLASMA: Lactic Acid, Venous: 1.4 mmol/L (ref 0.5–1.9)

## 2019-08-02 LAB — MRSA PCR SCREENING: MRSA by PCR: POSITIVE — AB

## 2019-08-02 LAB — SEDIMENTATION RATE: Sed Rate: 101 mm/hr — ABNORMAL HIGH (ref 0–20)

## 2019-08-02 IMAGING — CT CT ANGIO CHEST
2 of 7 series · 15 of 46 positions shown · IV contrast (APPLIED)
Comparison: None.
COMPARISON: None.

Addendum:
CLINICAL DATA: Shortness of breath, severe back pain radiating down
the right leg

EXAM:
CT ANGIOGRAPHY CHEST
CT ABDOMEN AND PELVIS WITH CONTRAST
TECHNIQUE: Multidetector CT imaging of the chest was performed using the
standard protocol during bolus administration of intravenous
contrast. Multiplanar CT image reconstructions and MIPs were
obtained to evaluate the vascular anatomy. Multidetector CT imaging
of the abdomen and pelvis was performed using the standard protocol
during bolus administration of intravenous contrast.
CONTRAST:  75mL OMNIPAQUE IOHEXOL 350 MG/ML SOLN

[Series 3: thins · axial · 0.69mm/px · z∈[-217,+62]mm · 12 of 313 slices shown]
[im 17/313  lung]
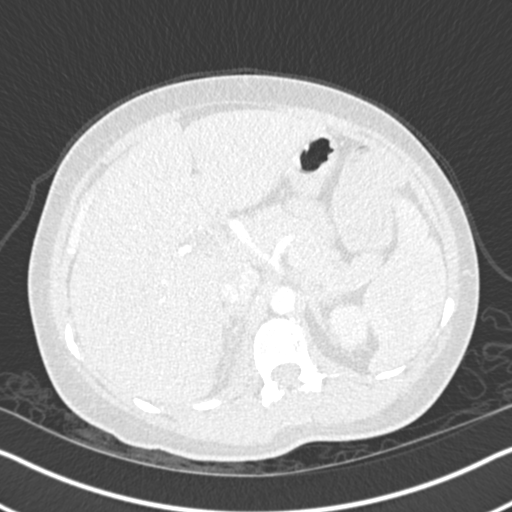
[im 50/313  soft-tissue]
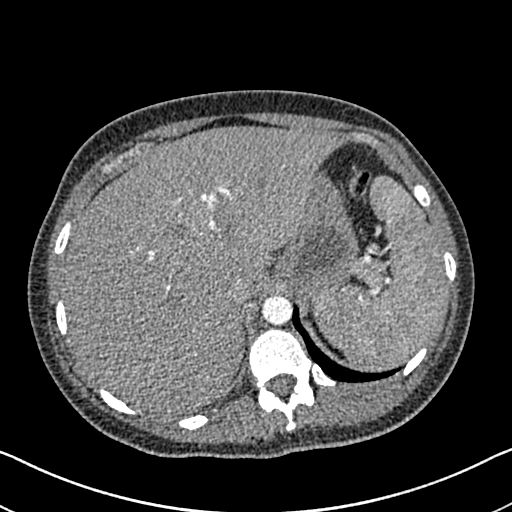
[im 66/313  lung]
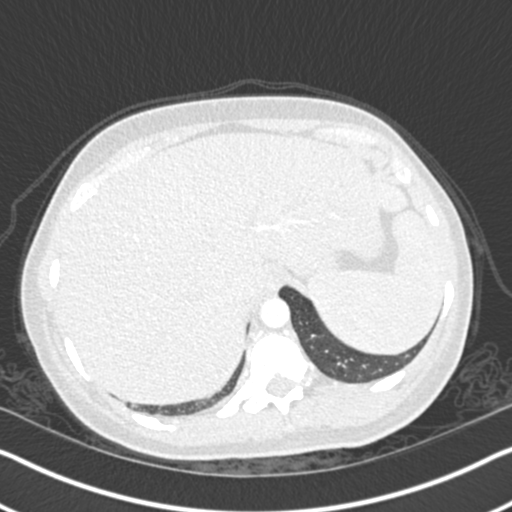
[im 99/313  soft-tissue]
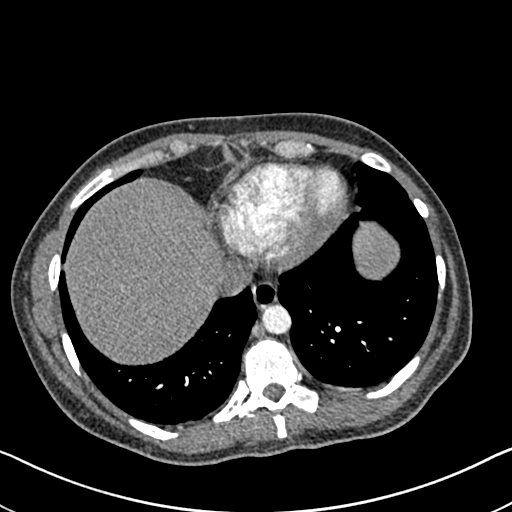
[im 115/313  lung]
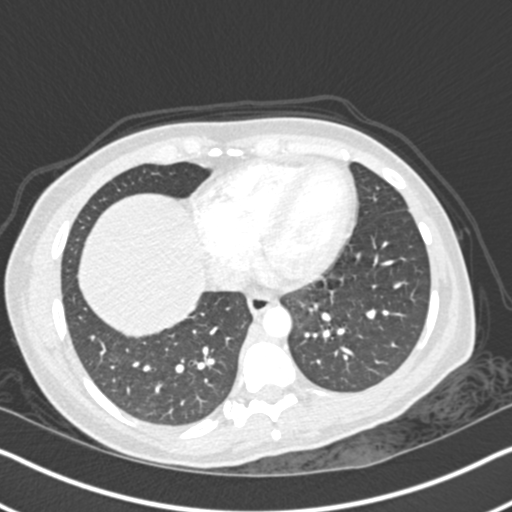
[im 148/313  soft-tissue]
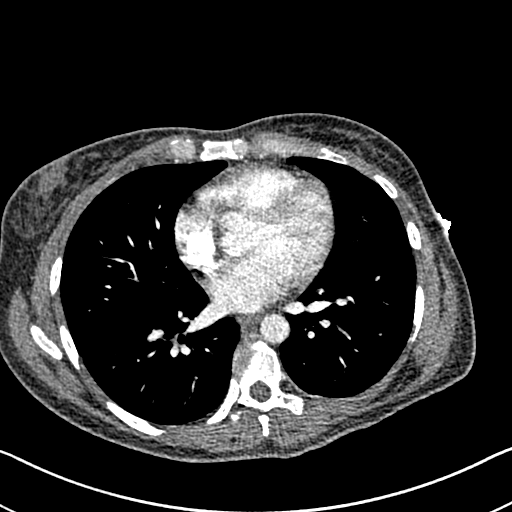
[im 165/313  lung]
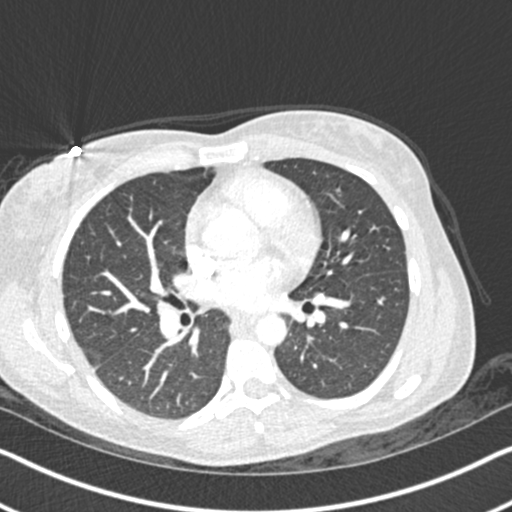
[im 198/313  soft-tissue]
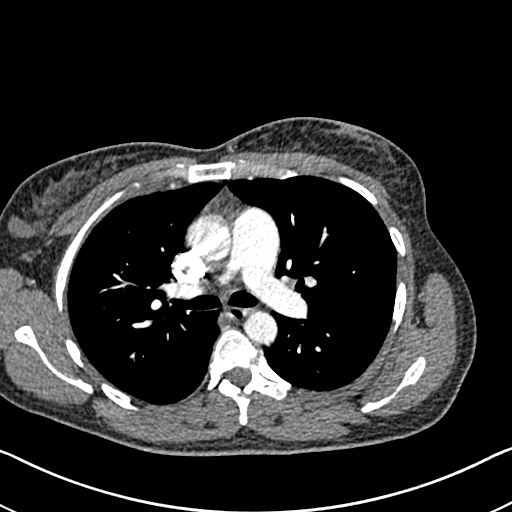
[im 214/313  lung]
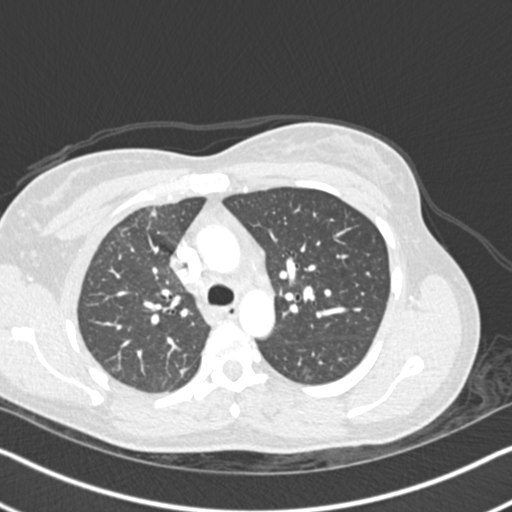
[im 247/313  soft-tissue]
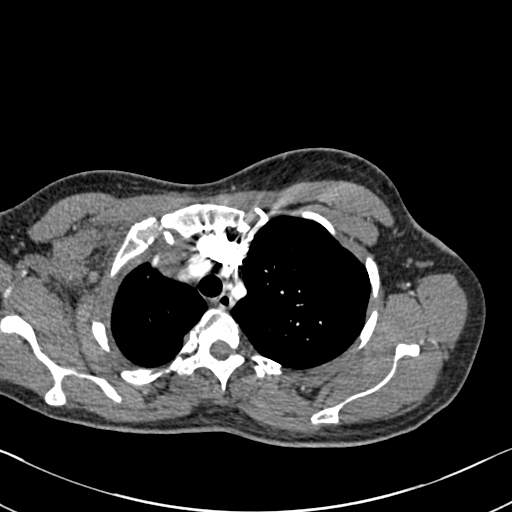
[im 263/313  lung]
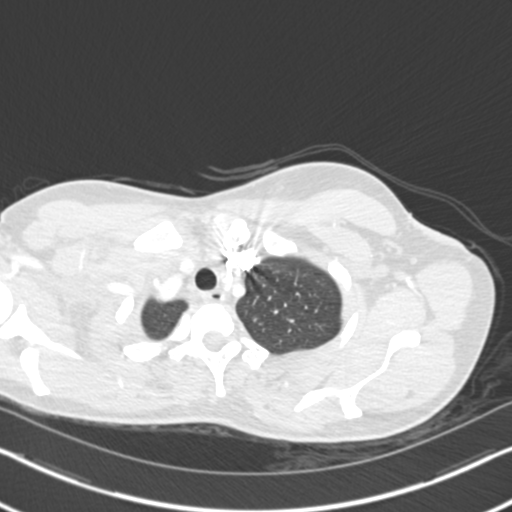
[im 296/313  soft-tissue]
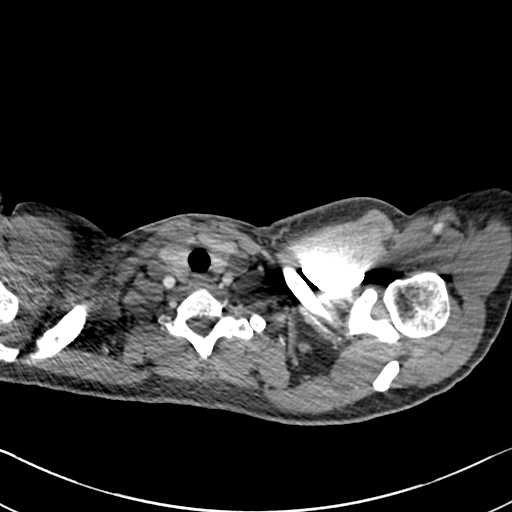

[Series 5: coronal mpr · coronal · 0.63mm/px · 3 of 132 slices shown]
[im 33/132  soft-tissue]
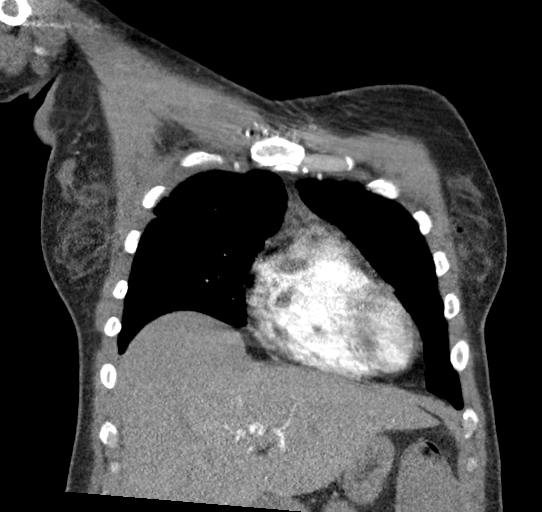
[im 66/132  soft-tissue]
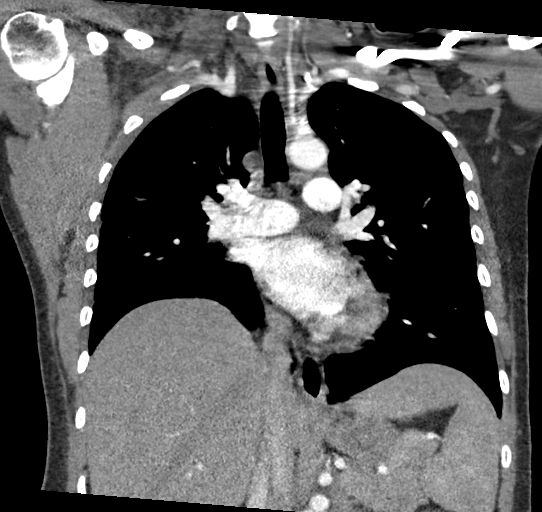
[im 99/132  soft-tissue]
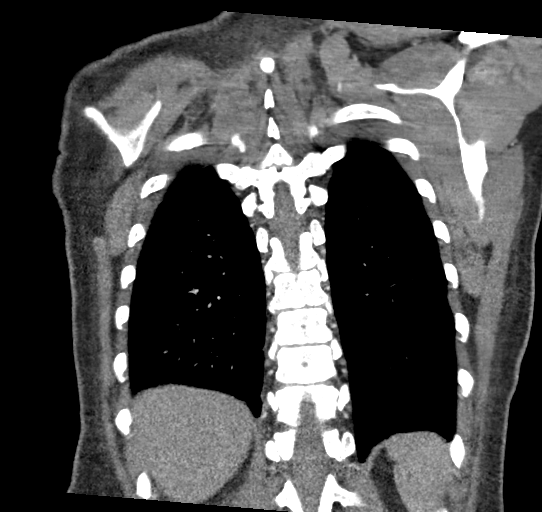

[15 of 46 positions shown; findings below may reference images not displayed]

FINDINGS: CTA CHEST FINDINGS

Cardiovascular: Satisfactory opacification of the pulmonary arteries
to the segmental level. No evidence of pulmonary embolism. Normal
heart size. No pericardial effusion.

Mediastinum/Nodes: No enlarged mediastinal, hilar, or axillary lymph
nodes. Thyroid gland, trachea, and esophagus demonstrate no
significant findings.

Lungs/Pleura: Lungs are clear. No pleural effusion or pneumothorax.

Musculoskeletal: No chest wall abnormality. No acute or significant
osseous findings.

Review of the MIP images confirms the above findings.

CT ABDOMEN and PELVIS FINDINGS

Hepatobiliary: No focal liver abnormality is seen. No gallstones,
gallbladder wall thickening, or biliary dilatation.

Pancreas: Unremarkable. No pancreatic ductal dilatation or
surrounding inflammatory changes.

Spleen: Normal in size without focal abnormality.

Adrenals/Urinary Tract: Adrenal glands are unremarkable. Kidneys are
normal, without renal calculi, focal lesion, or hydronephrosis.
Bladder is unremarkable.

Stomach/Bowel: Stomach is within normal limits. Appendix appears
normal. No evidence of bowel wall thickening, distention, or
inflammatory changes.

Vascular/Lymphatic: No significant vascular findings are present. No
enlarged abdominal or pelvic lymph nodes.

Reproductive: No adnexal mass. T-type IUD within the uterus. A tip
of 'T' has perforated through the uterine wall and projects into the
peroneal cavity.

Other: No abdominal wall hernia or abnormality. No abdominopelvic
ascites.

Musculoskeletal: No acute osseous abnormality. No aggressive osseous
lesion. Vertebral body heights are maintained and are in normal
anatomic alignment. No bone destruction or periosteal reaction. Mild
osteoarthritis of bilateral SI joints. Heterogeneous enhancement and
severe expansion of the right psoas and iliacus muscles with mild
adjacent soft tissue edema.

Review of the MIP images confirms the above findings.
IMPRESSION: 1. Heterogeneous enhancement and severe expansion of the right psoas
and iliacus muscles with mild adjacent soft tissue edema. Overall
appearance is concerning for infectious myositis versus less likely
muscle strain and intramuscular hemorrhage. Subtle small
hypodensities within the iliopsoas muscle measuring up to 13 mm
concerning for an intramuscular abscess. The lumbar spine
demonstrates no focal abnormality to suggest discitis or
osteomyelitis, but MRI of the lumbar spine without and with
intravenous contrast is recommended for increased sensitivity if
there is concern for discitis/osteomyelitis.
2. T-type IUD within the uterus. A tip of 'T' has perforated through
the uterine wall and projects into the peroneal cavity.
3. No acute cardiopulmonary disease.

ADDENDUM:
Not mentioned above:

Thickening of the intercostal muscle between the right anterior
first and second ribs with adjacent pleural reaction and soft tissue
edema in the adjacent subcutaneous fat likely reflecting an
inflammatory process given the patient's abnormalities along the
right iliopsoas muscles and right sacroiliitis. No drainable fluid
collection.

These findings were discussed with Dr. DELOUG.

*** End of Addendum ***
FINDINGS: CTA CHEST FINDINGS

Cardiovascular: Satisfactory opacification of the pulmonary arteries
to the segmental level. No evidence of pulmonary embolism. Normal
heart size. No pericardial effusion.

Mediastinum/Nodes: No enlarged mediastinal, hilar, or axillary lymph
nodes. Thyroid gland, trachea, and esophagus demonstrate no
significant findings.

Lungs/Pleura: Lungs are clear. No pleural effusion or pneumothorax.

Musculoskeletal: No chest wall abnormality. No acute or significant
osseous findings.

Review of the MIP images confirms the above findings.

CT ABDOMEN and PELVIS FINDINGS

Hepatobiliary: No focal liver abnormality is seen. No gallstones,
gallbladder wall thickening, or biliary dilatation.

Pancreas: Unremarkable. No pancreatic ductal dilatation or
surrounding inflammatory changes.

Spleen: Normal in size without focal abnormality.

Adrenals/Urinary Tract: Adrenal glands are unremarkable. Kidneys are
normal, without renal calculi, focal lesion, or hydronephrosis.
Bladder is unremarkable.

Stomach/Bowel: Stomach is within normal limits. Appendix appears
normal. No evidence of bowel wall thickening, distention, or
inflammatory changes.

Vascular/Lymphatic: No significant vascular findings are present. No
enlarged abdominal or pelvic lymph nodes.

Reproductive: No adnexal mass. T-type IUD within the uterus. A tip
of 'T' has perforated through the uterine wall and projects into the
peroneal cavity.

Other: No abdominal wall hernia or abnormality. No abdominopelvic
ascites.

Musculoskeletal: No acute osseous abnormality. No aggressive osseous
lesion. Vertebral body heights are maintained and are in normal
anatomic alignment. No bone destruction or periosteal reaction. Mild
osteoarthritis of bilateral SI joints. Heterogeneous enhancement and
severe expansion of the right psoas and iliacus muscles with mild
adjacent soft tissue edema.

Review of the MIP images confirms the above findings.
IMPRESSION: 1. Heterogeneous enhancement and severe expansion of the right psoas
and iliacus muscles with mild adjacent soft tissue edema. Overall
appearance is concerning for infectious myositis versus less likely
muscle strain and intramuscular hemorrhage. Subtle small
hypodensities within the iliopsoas muscle measuring up to 13 mm
concerning for an intramuscular abscess. The lumbar spine
demonstrates no focal abnormality to suggest discitis or
osteomyelitis, but MRI of the lumbar spine without and with
intravenous contrast is recommended for increased sensitivity if
there is concern for discitis/osteomyelitis.
2. T-type IUD within the uterus. A tip of 'T' has perforated through
the uterine wall and projects into the peroneal cavity.
3. No acute cardiopulmonary disease.

## 2019-08-02 IMAGING — MR MR LUMBAR SPINE W/O CM
5 series · 31 of 48 positions shown · non-contrast
Comparison: CT [DATE]

CLINICAL DATA: Low back pain with radiculopathy. No recent injury

EXAM:
MRI LUMBAR SPINE WITHOUT CONTRAST
TECHNIQUE: Multiplanar, multisequence MR imaging of the lumbar spine was
performed. No intravenous contrast was administered.

[Series 5: T2 · sagittal · 4.0mm · 0.81mm/px · 7 of 17 slices shown (1 of 2)]
[im 1/17]
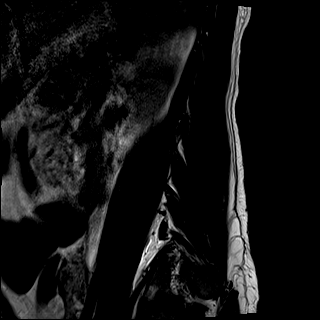
[im 3/17]
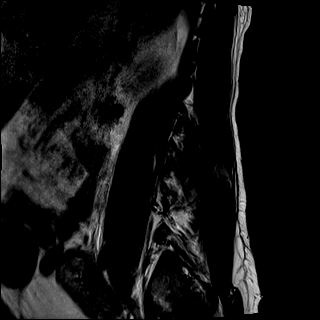
[im 6/17]
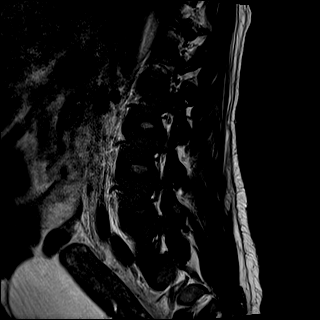
[im 9/17]
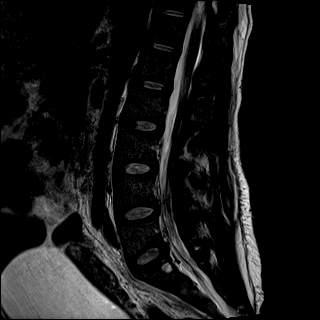
[im 11/17]
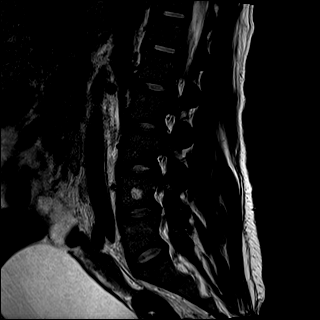
[im 14/17]
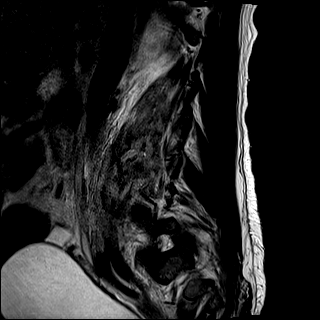
[im 17/17]
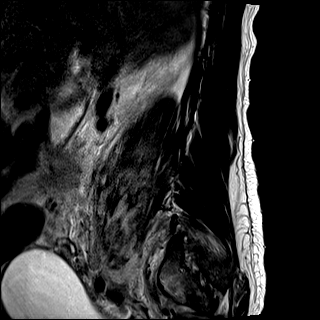

[Series 6: T1 · sagittal · 4.0mm · 0.81mm/px · 7 of 17 slices shown (1 of 2)]
[im 1/17]
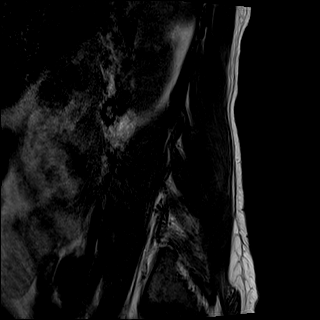
[im 3/17]
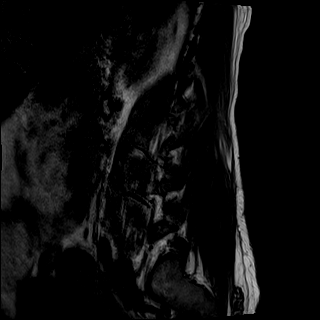
[im 6/17]
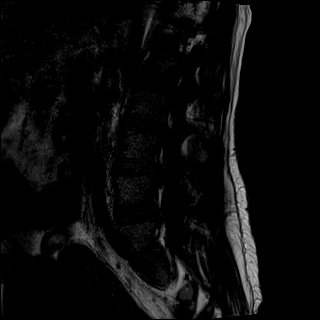
[im 9/17]
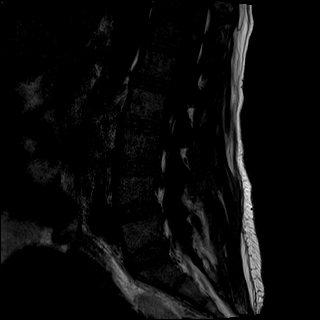
[im 11/17]
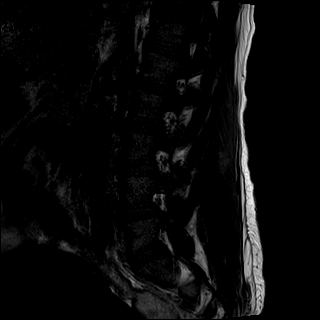
[im 14/17]
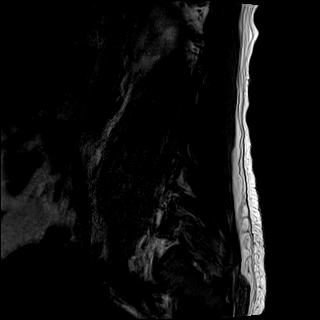
[im 17/17]
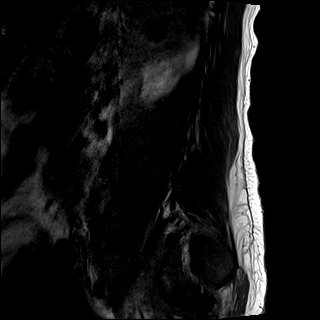

[Series 7: T2 · axial · 4.0mm · 0.78mm/px · z∈[-72,+131]mm · 8 of 36 slices shown (2 of 2)]
[im 1/36]
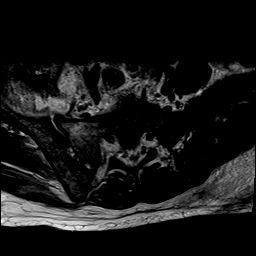
[im 6/36]
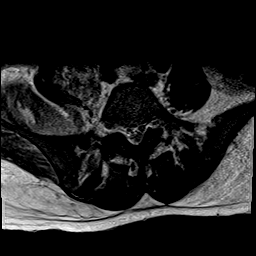
[im 11/36]
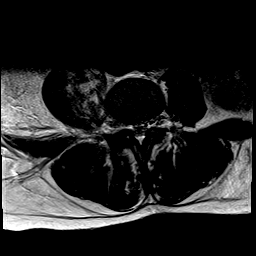
[im 17/36]
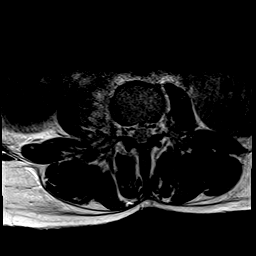
[im 19/36]
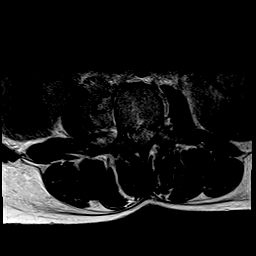
[im 25/36]
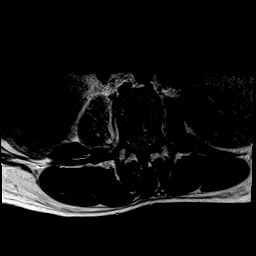
[im 30/36]
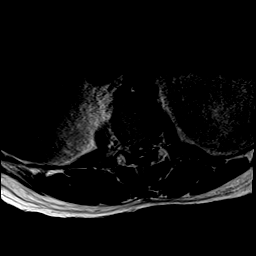
[im 36/36]
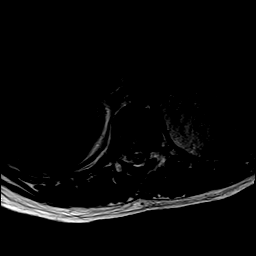

[Series 8: STIR · sagittal · 4.0mm · 0.51mm/px · 1 of 15 slices shown]
[im 1/15]
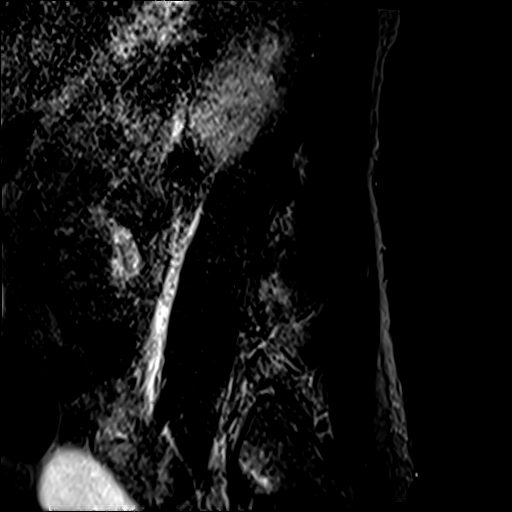

[Series 9: T1 · axial · 4.0mm · 0.39mm/px · z∈[-72,+131]mm · 8 of 36 slices shown (2 of 2)]
[im 1/36]
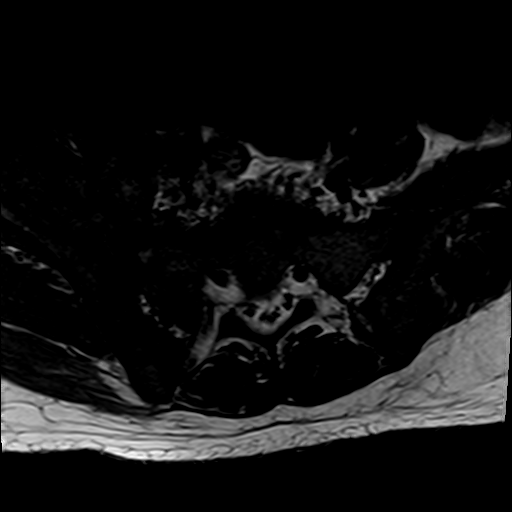
[im 6/36]
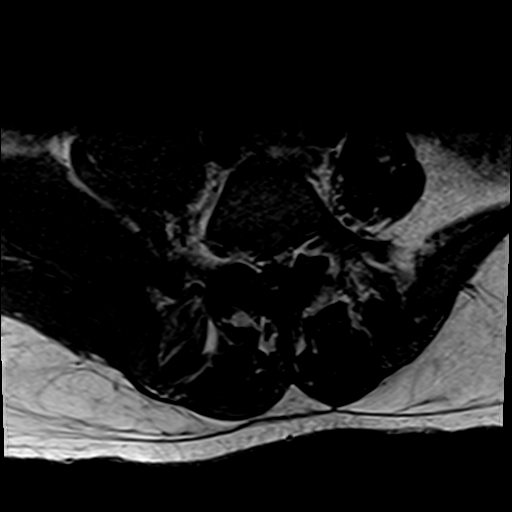
[im 11/36]
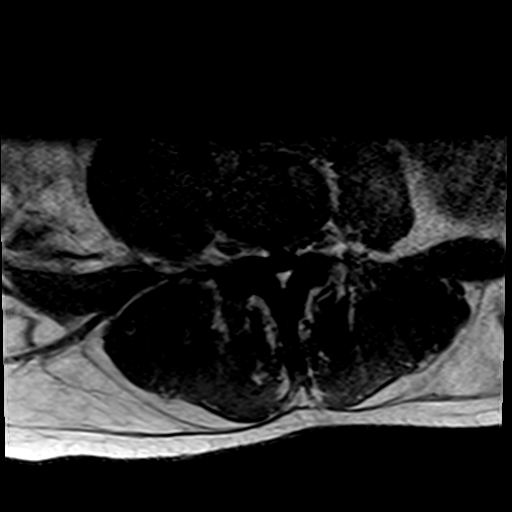
[im 17/36]
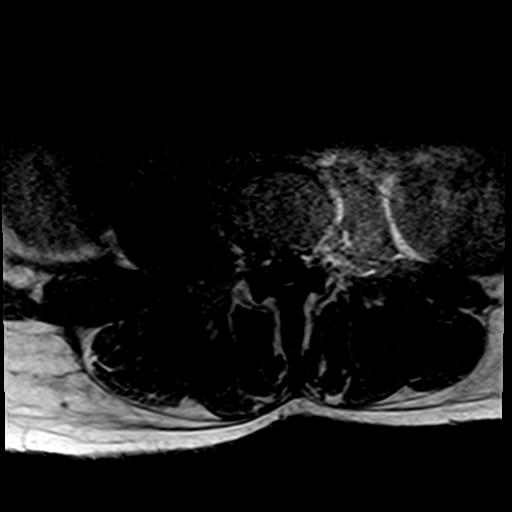
[im 19/36]
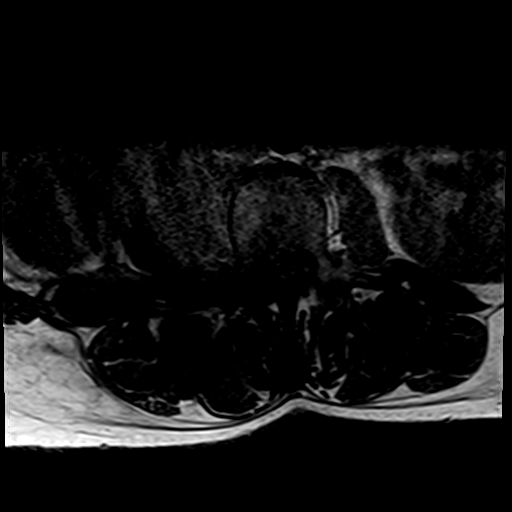
[im 25/36]
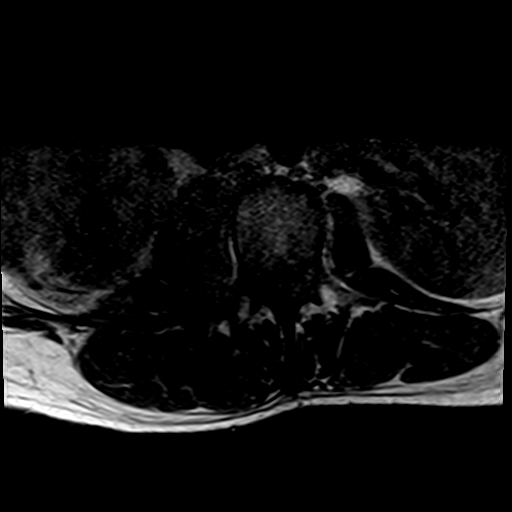
[im 30/36]
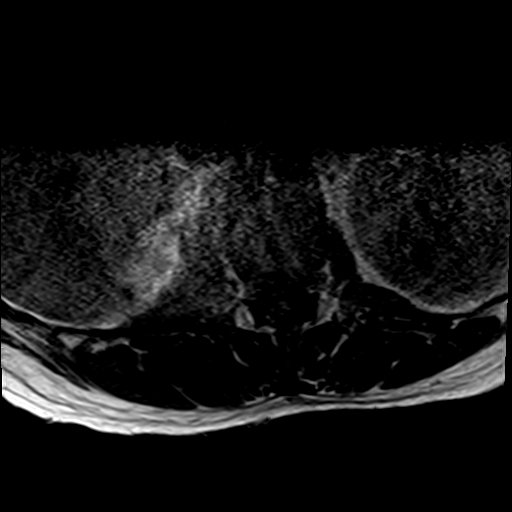
[im 36/36]
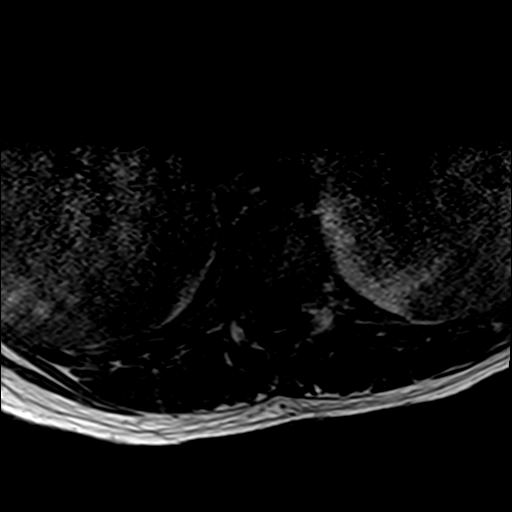

[31 of 48 positions shown; findings below may reference images not displayed]

FINDINGS: Technical note: Examination is slightly degraded by patient motion
artifact. Patient was unable to tolerate any further imaging and no
postcontrast sequences were able to be performed.

Segmentation:  Standard.

Alignment:  Physiologic.

Vertebrae:  No fracture, evidence of discitis, or bone lesion.

Conus medullaris and cauda equina: Conus extends to the L2 level.
Conus and cauda equina appear normal.

Paraspinal and other soft tissues: Expansion and extensive fluid and
edema signal within the right psoas muscle as well as the visualized
portion of the right iliacus muscle. Fluid collection within the
medial aspect of the right iliacus muscle measures up to 2.2 x
cm in transaxial dimension. The craniocaudal extent of the
collection is not entirely included within the field of view. Fluid
signal is seen within both the medial iliacus and or distal psoas
muscle at the edge of the field of view which appears to extend from
the right sacroiliac joint (series 7, image 36). There is patchy
bone marrow edema within the right sacrum adjacent to the right SI
joint. Visualized portion of the left psoas muscle is normal. The
posterior paraspinal musculature is normal.

The canal from the L4-5 level and caudally is slightly
heterogeneous, which is felt to most likely be secondary to motion
artifact and prominence of the epidural fat. No definite epidural
fluid collection.

Disc levels:

T12-L1: No significant disc protrusion, foraminal stenosis, or canal
stenosis.

L1-L2: No significant disc protrusion, foraminal stenosis, or canal
stenosis.

L2-L3: No significant disc protrusion, foraminal stenosis, or canal
stenosis.

L3-L4: No significant disc protrusion, foraminal stenosis, or canal
stenosis.

L4-L5: No significant disc protrusion, foraminal stenosis, or canal
stenosis.

L5-S1: No significant disc protrusion, foraminal stenosis, or canal
stenosis.
IMPRESSION: 1. Limited study due to patient motion artifact. Patient was unable
to tolerate any postcontrast imaging.
2. Findings suggestive of infectious right-sided sacroiliitis with
associated myositis and intramuscular abscesses involving the right
psoas and iliacus musculature. Please see dedicated MRI of the
pelvis and right hip which more fully images the musculature for
further detail.
3. Negative for osteomyelitis-discitis.
4. Slightly heterogeneous appearance of the distal lumbar canal
below the L4-5 level on STIR sequences, which is favored to be
secondary to a combination of prominent epidural fat and motion
artifact. Postcontrast imaging would be helpful to exclude the
presence of epidural extension of infection.
5. No significant disc disease. No evidence of nerve impingement.

## 2019-08-02 IMAGING — CT CT GUIDANCE NEEDLE PLACEMENT
1 of 3 series · 14 of 32 positions shown, 18 images · non-contrast
Comparison: CT abdomen pelvis-earlier same day;

INDICATION: History of intravenous drug abuse, now with right
pelvic/retroperitoneal abscess. Please perform CT-guided aspiration
and/or drainage catheter placement.

EXAM:
CT-GUIDED RIGHT ILIACUS MUSCULAR ABSCESS ASPIRATION

[Series 2: i-spiral 5.0 b30f · axial · 0.78mm/px · z∈[-409,-90]mm · 14 of 105 slices shown, 18 images]
[im 9/105  soft-tissue]
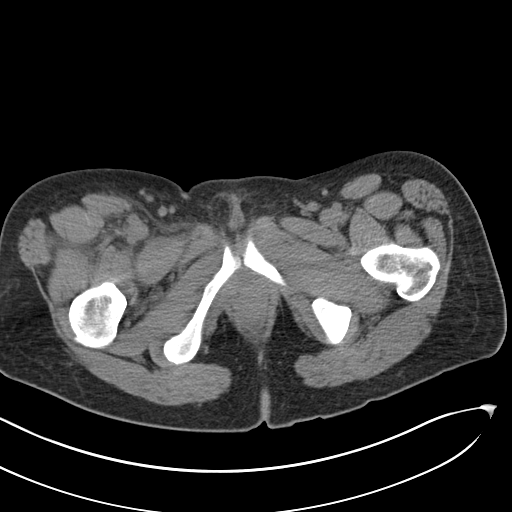
[im 9/105  bone]
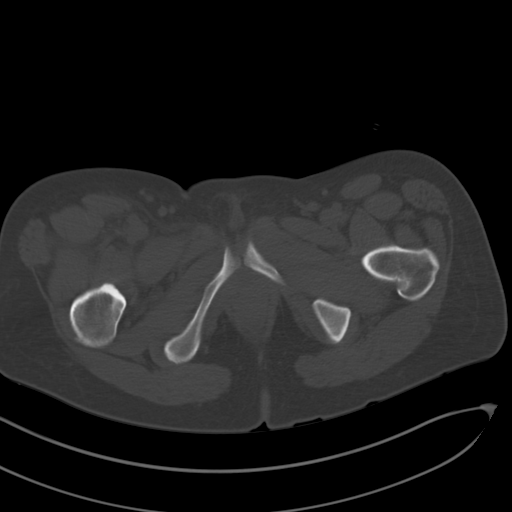
[im 17/105  soft-tissue]
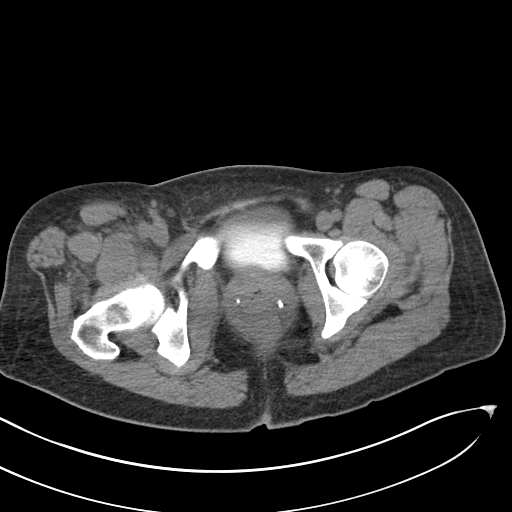
[im 25/105  soft-tissue]
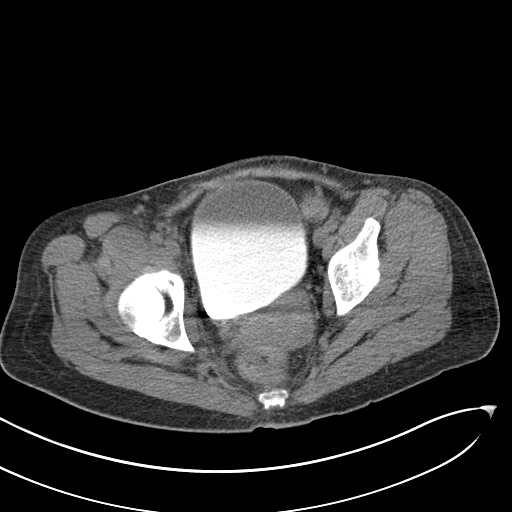
[im 34/105  soft-tissue]
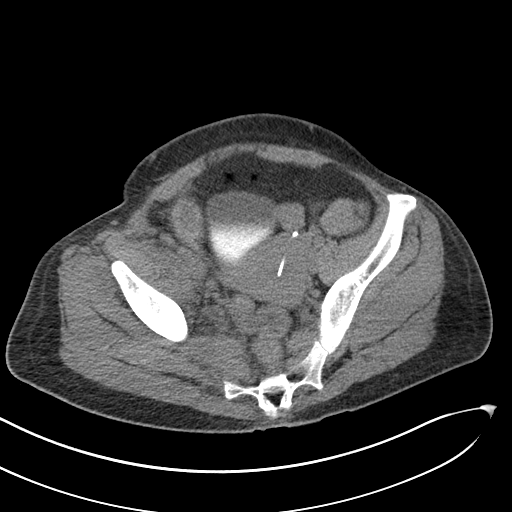
[im 42/105  soft-tissue]
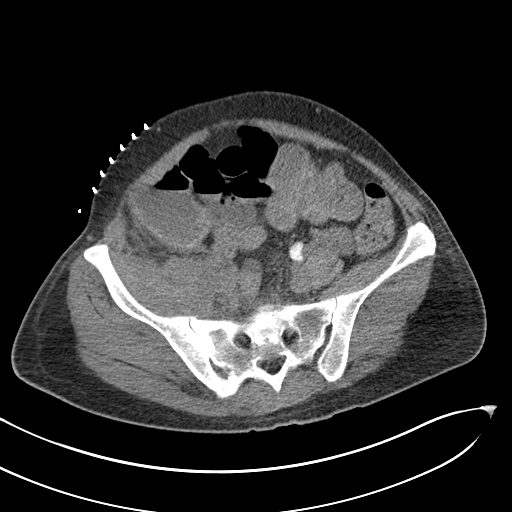
[im 50/105  soft-tissue]
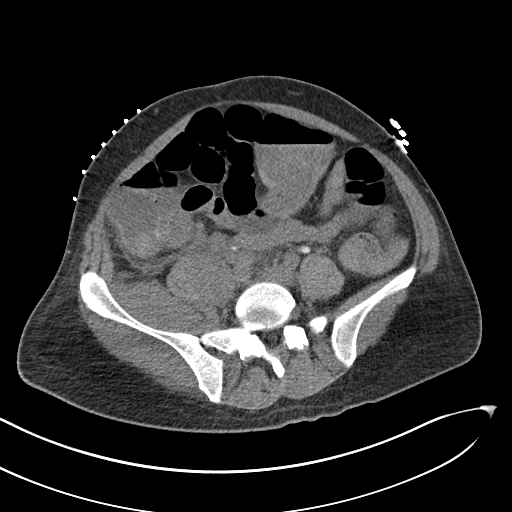
[im 59/105  soft-tissue]
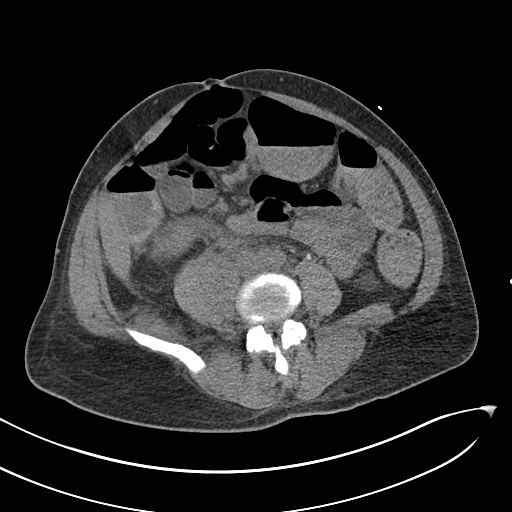
[im 67/105  soft-tissue]
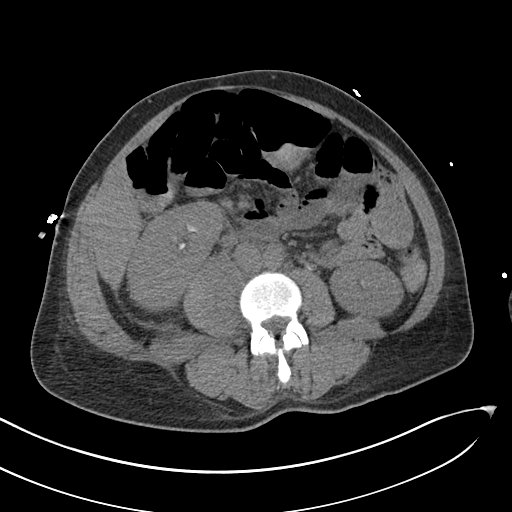
[im 75/105  soft-tissue]
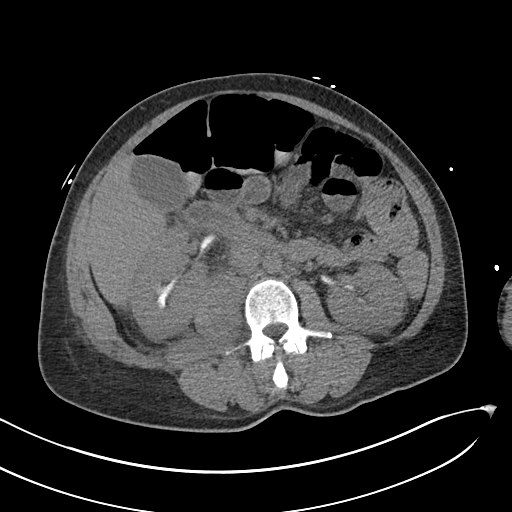
[im 75/105  bone]
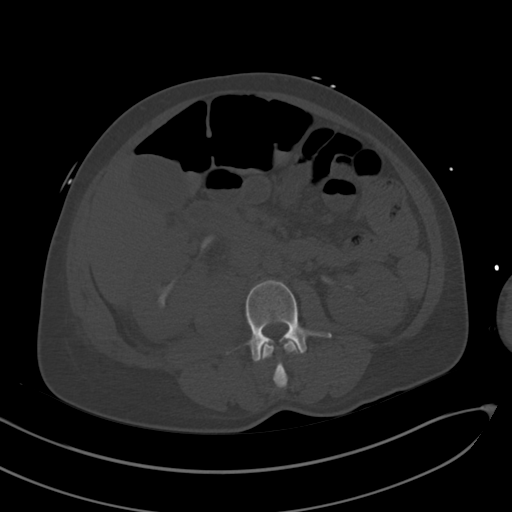
[im 84/105  soft-tissue]
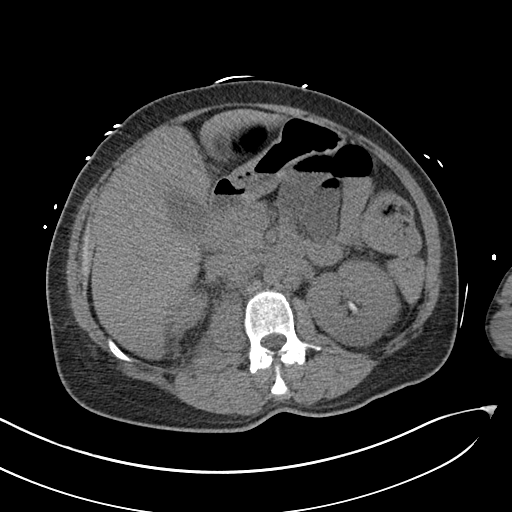
[im 88/105  lung]
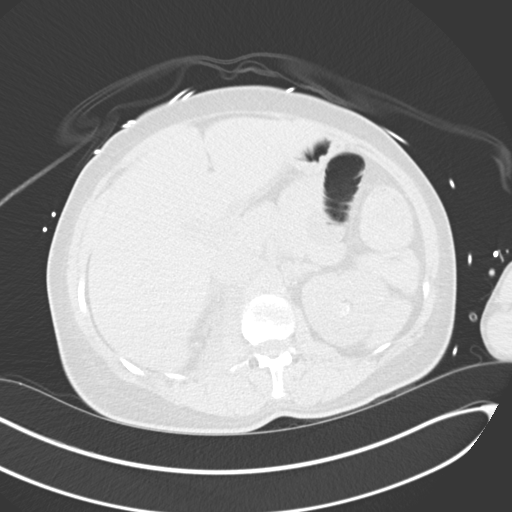
[im 92/105  soft-tissue]
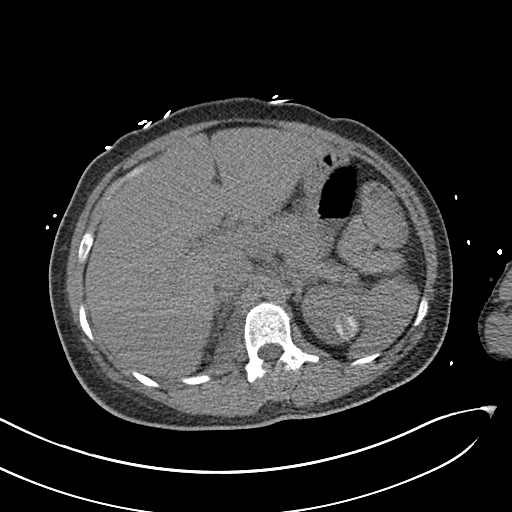
[im 92/105  lung]
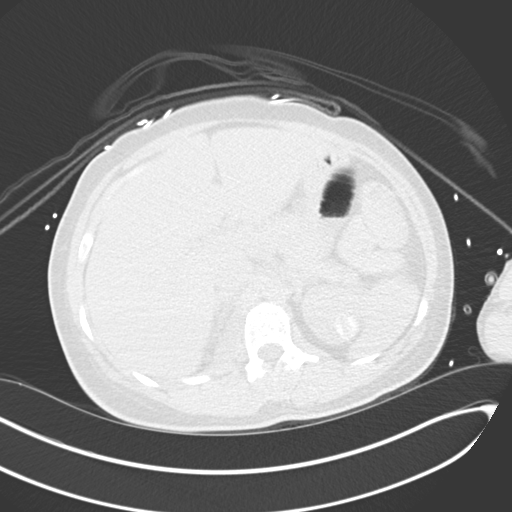
[im 96/105  lung]
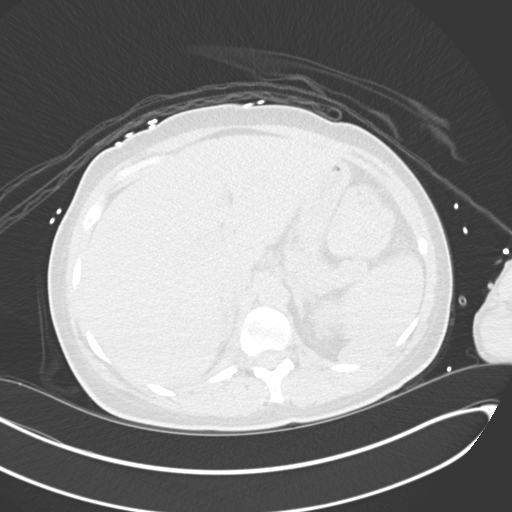
[im 100/105  soft-tissue]
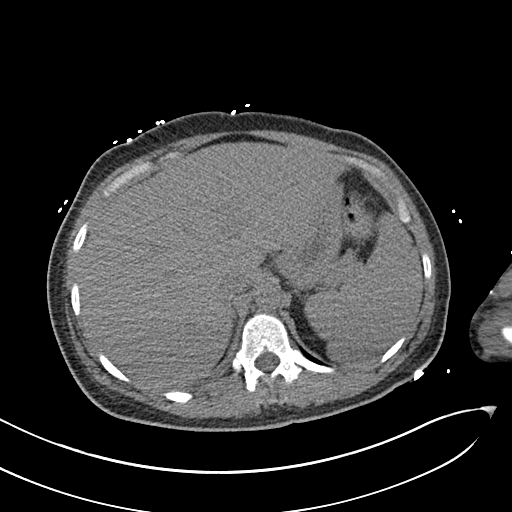
[im 100/105  lung]
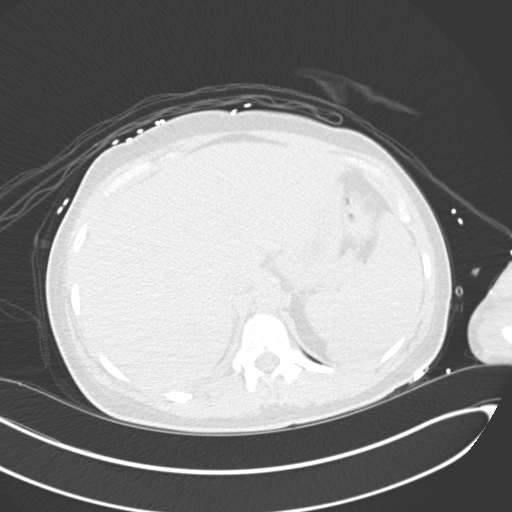

[14 of 32 positions shown; findings below may reference images not displayed]

lumbar spine and
right hip MRI-earlier same

MEDICATIONS:
None

ANESTHESIA/SEDATION:
Moderate (conscious) sedation was employed during this procedure. A
total of Versed 3 mg and Fentanyl 150 mcg was administered
intravenously.

Moderate Sedation Time: 34 minutes. The patient's level of
consciousness and vital signs were monitored continuously by
radiology nursing throughout the procedure under my direct
supervision.

CONTRAST:  None

COMPLICATIONS:
None immediate.

PROCEDURE:
Informed written consent was obtained from the patient after a
discussion of the risks, benefits and alternatives to treatment. The
patient was placed supine on the CT gantry and a pre procedural CT
was performed re-demonstrating the known abscess/fluid collection
within the right iliacus musculature with dominant ill-defined
component measuring approximately 2.4 x 2.0 cm (image 60, series 2).
The procedure was planned. A timeout was performed prior to the
initiation of the procedure.

The skin overlying the inferior anterior aspect of the right lower
abdomen/pelvis was prepped and draped in the usual sterile fashion.
The overlying soft tissues were anesthetized with 1% lidocaine with
epinephrine. Appropriate trajectory was planned with the use of a 22
gauge spinal needle. An 18 gauge trocar needle was advanced into the
abscess/fluid collection. Appropriate positioning was confirmed with
a limited CT scan.

Given the small size of the collection as well as lack of peripheral
wall enhancement on preceding contrast-enhanced abdominal CT, the
decision was made to pursue aspiration at this time.

Next, approximately 3 cc of purulent appearing fluid was aspirated
as the trocar needle was slowly retracted. All aspirated fluid was
capped and sent to the laboratory for analysis.

A dressing was applied. The patient tolerated the procedure well
without immediate post procedural complication.
IMPRESSION: Successful CT guided aspiration of approximately 3 cc of purulent
appearing fluid from right iliacus intramuscular abscess, currently
not amenable to image guided drainage catheter placement. All
aspirated fluid was capped and sent to the laboratory as requested
by the ordering clinical team.

## 2019-08-02 IMAGING — MR MR HIP*R* W/O CM
3 series · 40 of 40 positions shown · non-contrast
Comparison: CT scan of the abdomen and pelvis dated [DATE]

CLINICAL DATA: Severe back pain radiating down the right hip and
leg. Fever.

EXAM:
MR OF THE RIGHT HIP WITHOUT CONTRAST
TECHNIQUE: Multiplanar, multisequence MR imaging was performed. No intravenous
contrast was administered.

[Series 21: PD fat-sat · coronal · 4.0mm · 0.70mm/px · 13 of 29 slices shown (1 of 2)]
[im 1/29]
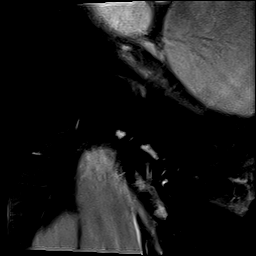
[im 3/29]
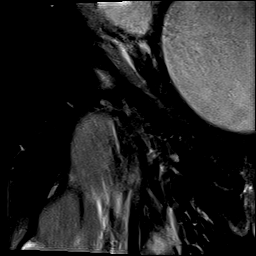
[im 5/29]
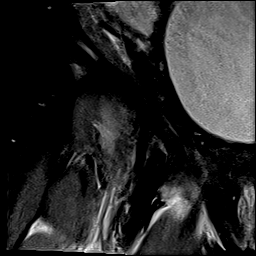
[im 8/29]
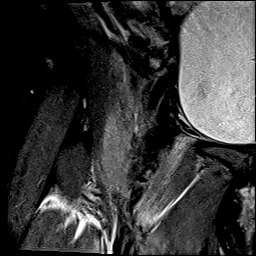
[im 10/29]
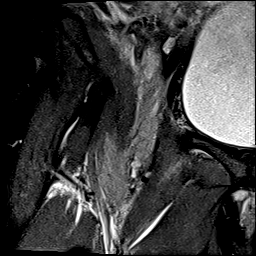
[im 12/29]
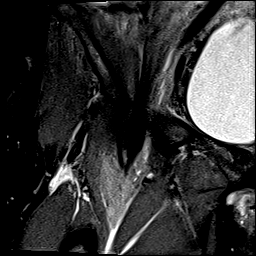
[im 15/29]
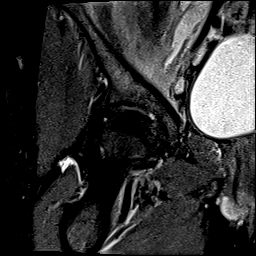
[im 17/29]
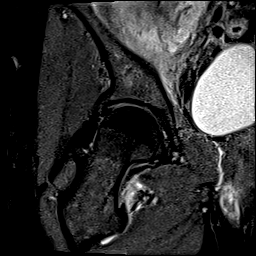
[im 19/29]
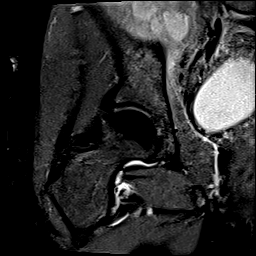
[im 22/29]
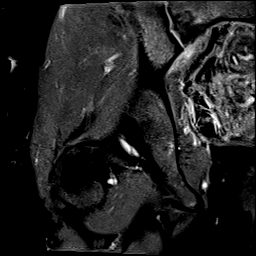
[im 24/29]
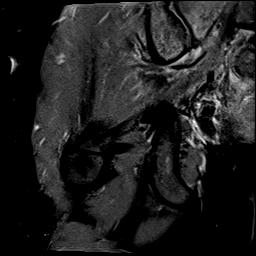
[im 26/29]
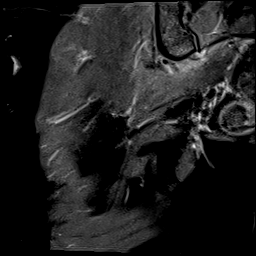
[im 29/29]
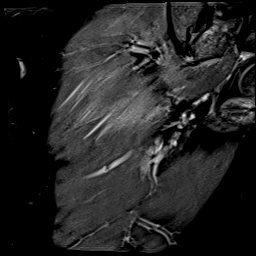

[Series 22: PD fat-sat · axial · 4.0mm · 0.62mm/px · z∈[-212,-57]mm · 14 of 32 slices shown (2 of 2)]
[im 1/32]
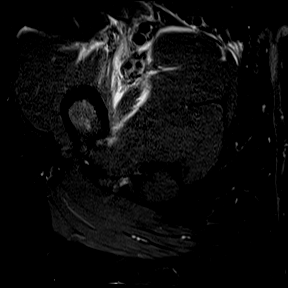
[im 3/32]
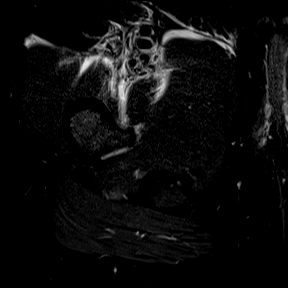
[im 5/32]
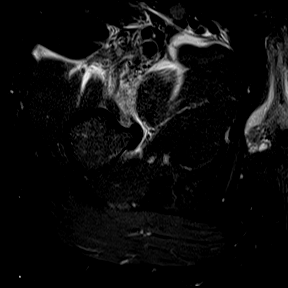
[im 8/32]
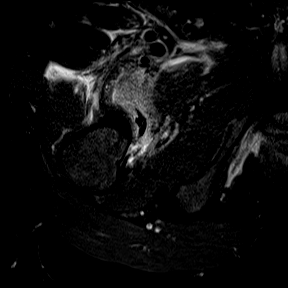
[im 10/32]
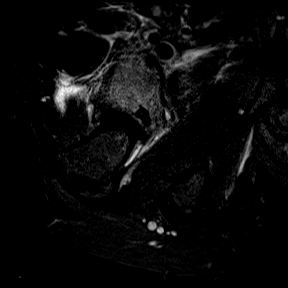
[im 12/32]
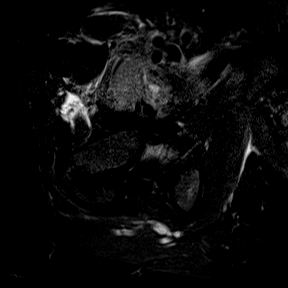
[im 15/32]
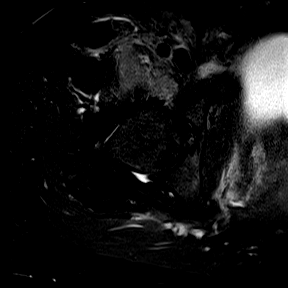
[im 17/32]
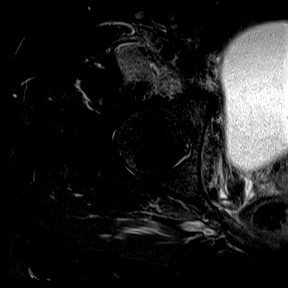
[im 20/32]
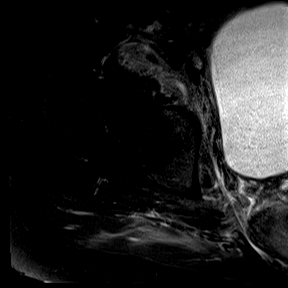
[im 22/32]
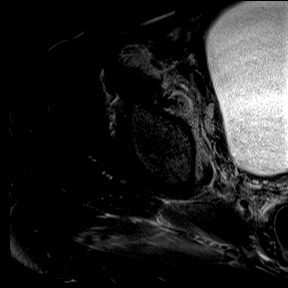
[im 24/32]
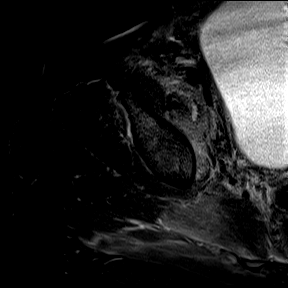
[im 27/32]
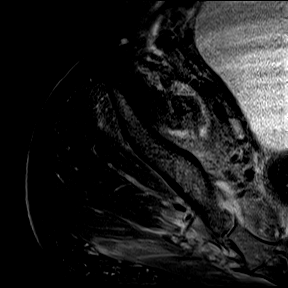
[im 29/32]
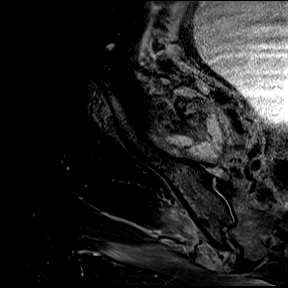
[im 32/32]
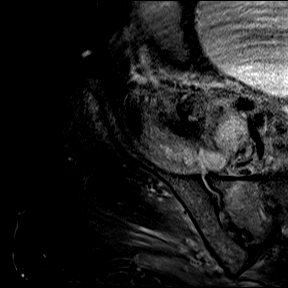

[Series 23: T1 fat-sat · axial · non-contrast · 4.0mm · 0.87mm/px · z∈[-207,-62]mm · 13 of 30 slices shown]
[im 1/30]
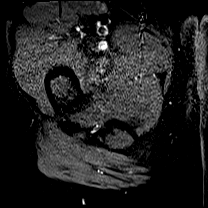
[im 3/30]
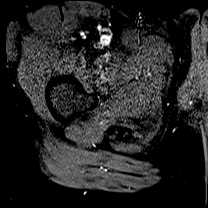
[im 5/30]
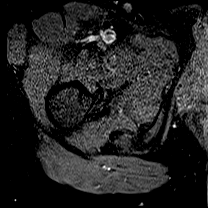
[im 8/30]
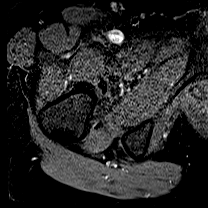
[im 10/30]
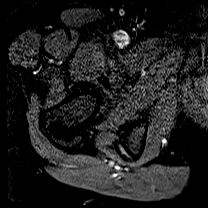
[im 13/30]
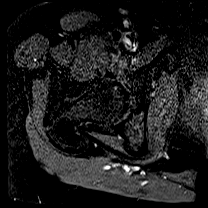
[im 15/30]
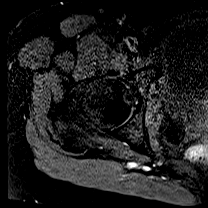
[im 17/30]
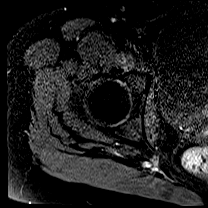
[im 20/30]
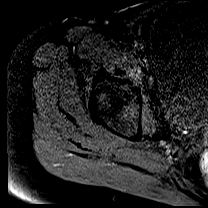
[im 22/30]
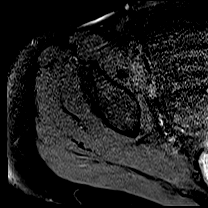
[im 25/30]
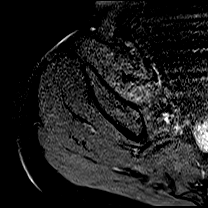
[im 27/30]
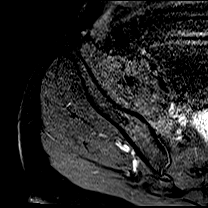
[im 30/30]
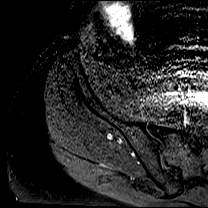

[40 of 40 positions shown; findings below may reference images not displayed]

FINDINGS: Bones: There is abnormal edema in the right side of the sacrum and
in the right iliac bone with fluid in the right SI joint, likely
representing osteomyelitis and sepsis of the right sacroiliac joint.

Articular cartilage and labrum

Articular cartilage:  Normal.

Labrum:  Normal.

Joint or bursal effusion

Joint effusion:  Trace joint effusion.

Bursae: No bursitis.

Muscles and tendons

Muscles and tendons: Extensive right iliopsoas abscess extending
from at least the L3 level to the insertion of the iliopsoas tendon
on the lesser trochanter. Is extensive edema in the iliopsoas muscle
consistent with myositis. There is also edema in 1 of the right hip
abductor muscles with fluid along the fascia of the muscles in the
anterior aspect of the proximal right thigh likely representing
infectious fasciitis.

There is also extension of abscess into the right gluteal muscles
through the a posterior aspect of the right sacroiliac joint seen on
images 1 and 2 of series 19. There is edema in the gluteus minimus,
medius, and maximus muscles consistent with myositis. There is edema
in the right piriformis muscle consistent with myositis seen on
image 8 of series 19.

Other findings

Miscellaneous:   Distended urinary bladder.
IMPRESSION: 1. Extensive right iliopsoas abscess extending from at least the
L3-4 level to the insertion of the iliopsoas tendon on the lesser
trochanter.
2. Extensive myositis of the right iliopsoas muscle.
3. Myositis of the right gluteus minimus, medius, and maximus and
right piriformis muscles.
4. Osteomyelitis of the right side of the sacrum and iliac bone.
5. Abscess extends through the posterior aspect of the right SI
joint into the gluteal musculature.
6. Fluid along the fascial planes in the anterior aspect of the
proximal right thigh likely representing infectious fasciitis.
7. MRI with contrast may better define the extent of the infection
and provide a baseline for a serial evaluation.

## 2019-08-02 IMAGING — CT CT ABD-PELV W/ CM
2 of 4 series · 12 of 46 positions shown, 14 images · IV contrast (APPLIED)
Comparison: None.
COMPARISON: None.

Addendum:
CLINICAL DATA: Shortness of breath, severe back pain radiating down
the right leg

EXAM:
CT ANGIOGRAPHY CHEST
CT ABDOMEN AND PELVIS WITH CONTRAST
TECHNIQUE: Multidetector CT imaging of the chest was performed using the
standard protocol during bolus administration of intravenous
contrast. Multiplanar CT image reconstructions and MIPs were
obtained to evaluate the vascular anatomy. Multidetector CT imaging
of the abdomen and pelvis was performed using the standard protocol
during bolus administration of intravenous contrast.
CONTRAST:  75mL OMNIPAQUE IOHEXOL 350 MG/ML SOLN

[Series 4: axial st · axial · 0.66mm/px · z∈[-515,-125]mm · 9 of 96 slices shown, 11 images]
[im 9/96  soft-tissue]
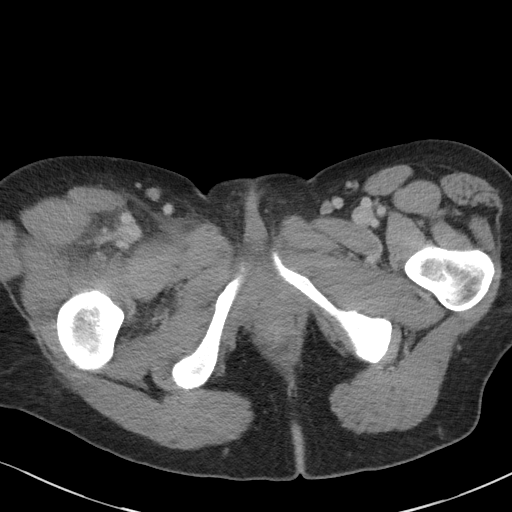
[im 9/96  bone]
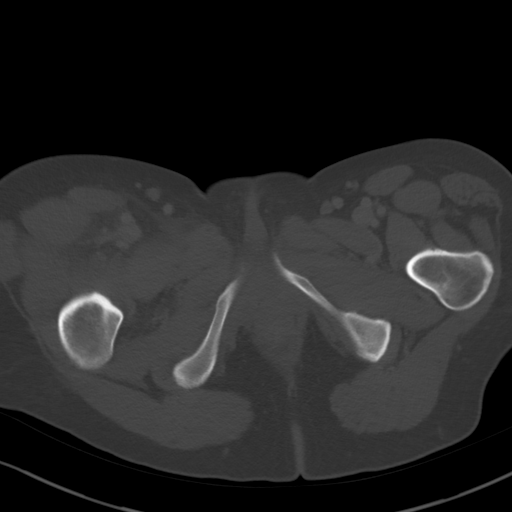
[im 17/96  soft-tissue]
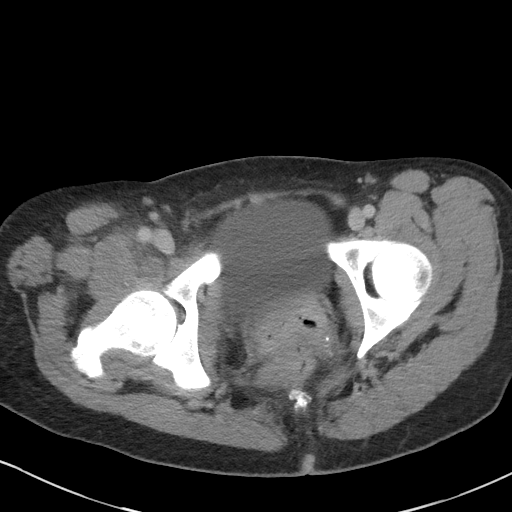
[im 29/96  soft-tissue]
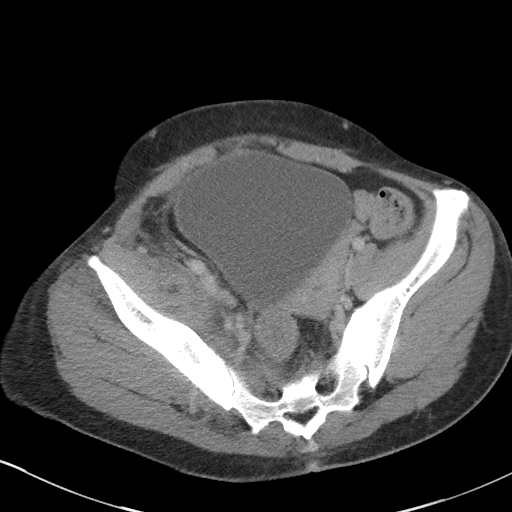
[im 38/96  soft-tissue]
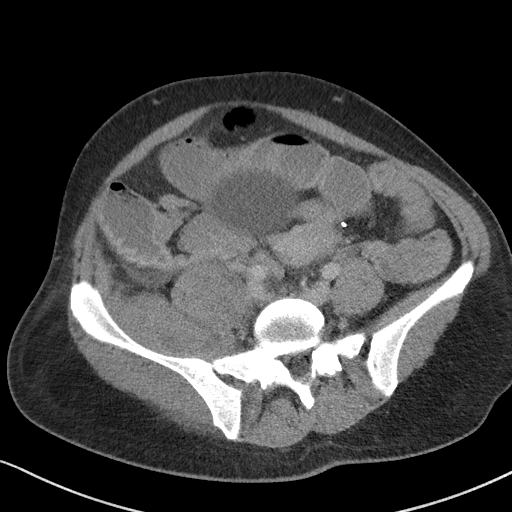
[im 50/96  soft-tissue]
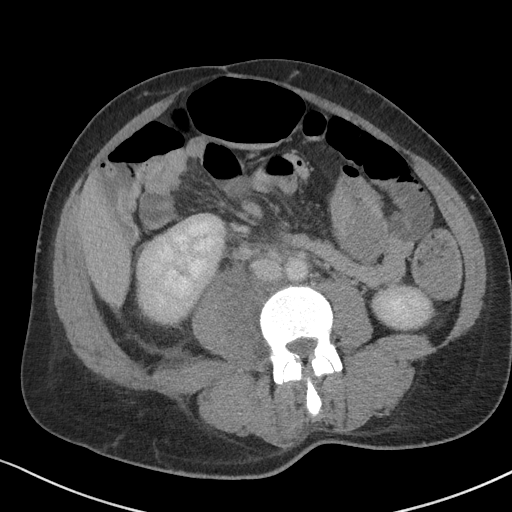
[im 58/96  soft-tissue]
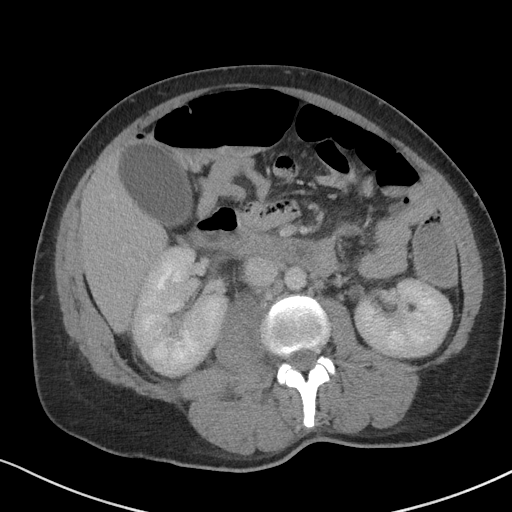
[im 67/96  soft-tissue]
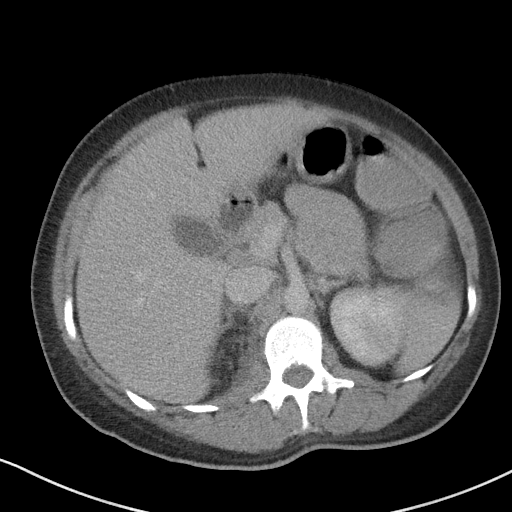
[im 79/96  soft-tissue]
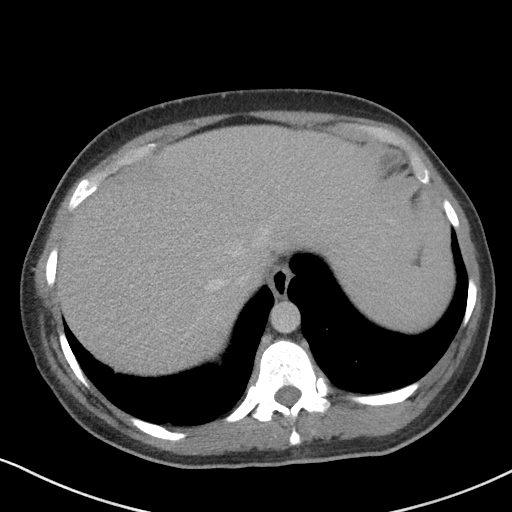
[im 87/96  soft-tissue]
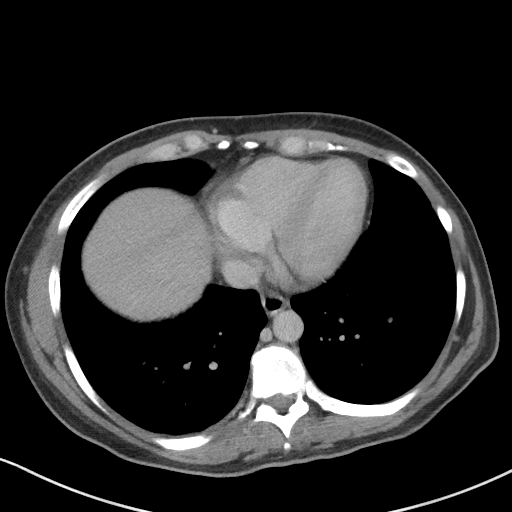
[im 87/96  bone]
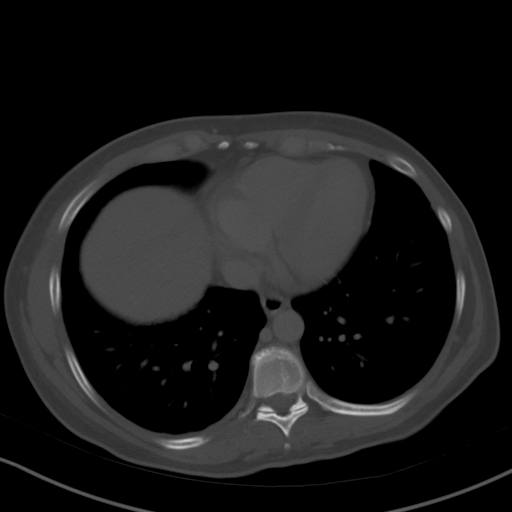

[Series 7: coronal st · coronal · 0.71mm/px · 3 of 99 slices shown]
[im 33/99  soft-tissue]
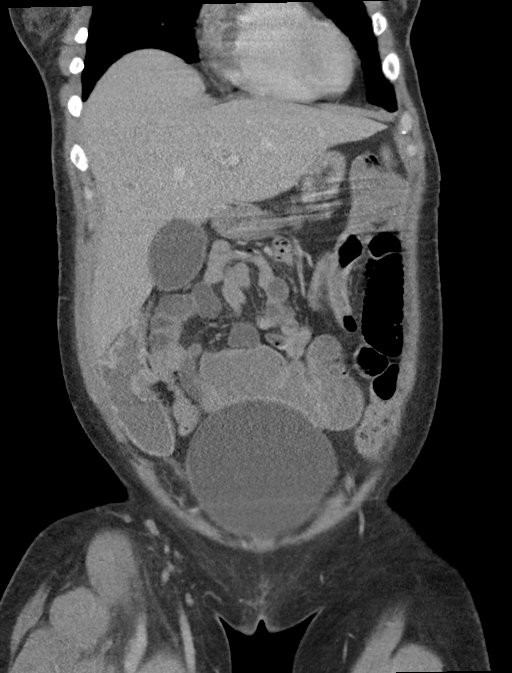
[im 44/99  soft-tissue]
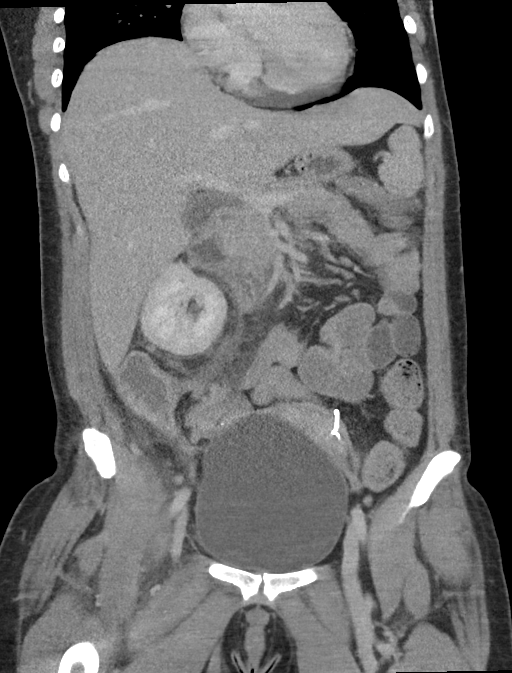
[im 55/99  soft-tissue]
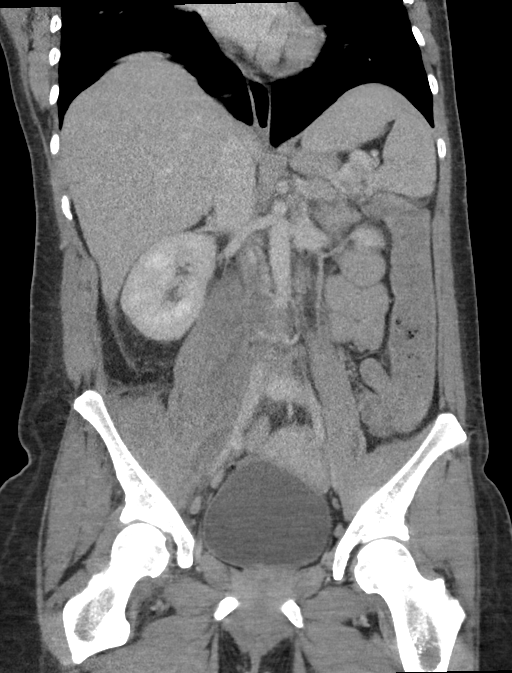

[12 of 46 positions shown; findings below may reference images not displayed]

FINDINGS: CTA CHEST FINDINGS

Cardiovascular: Satisfactory opacification of the pulmonary arteries
to the segmental level. No evidence of pulmonary embolism. Normal
heart size. No pericardial effusion.

Mediastinum/Nodes: No enlarged mediastinal, hilar, or axillary lymph
nodes. Thyroid gland, trachea, and esophagus demonstrate no
significant findings.

Lungs/Pleura: Lungs are clear. No pleural effusion or pneumothorax.

Musculoskeletal: No chest wall abnormality. No acute or significant
osseous findings.

Review of the MIP images confirms the above findings.

CT ABDOMEN and PELVIS FINDINGS

Hepatobiliary: No focal liver abnormality is seen. No gallstones,
gallbladder wall thickening, or biliary dilatation.

Pancreas: Unremarkable. No pancreatic ductal dilatation or
surrounding inflammatory changes.

Spleen: Normal in size without focal abnormality.

Adrenals/Urinary Tract: Adrenal glands are unremarkable. Kidneys are
normal, without renal calculi, focal lesion, or hydronephrosis.
Bladder is unremarkable.

Stomach/Bowel: Stomach is within normal limits. Appendix appears
normal. No evidence of bowel wall thickening, distention, or
inflammatory changes.

Vascular/Lymphatic: No significant vascular findings are present. No
enlarged abdominal or pelvic lymph nodes.

Reproductive: No adnexal mass. T-type IUD within the uterus. A tip
of 'T' has perforated through the uterine wall and projects into the
peroneal cavity.

Other: No abdominal wall hernia or abnormality. No abdominopelvic
ascites.

Musculoskeletal: No acute osseous abnormality. No aggressive osseous
lesion. Vertebral body heights are maintained and are in normal
anatomic alignment. No bone destruction or periosteal reaction. Mild
osteoarthritis of bilateral SI joints. Heterogeneous enhancement and
severe expansion of the right psoas and iliacus muscles with mild
adjacent soft tissue edema.

Review of the MIP images confirms the above findings.
IMPRESSION: 1. Heterogeneous enhancement and severe expansion of the right psoas
and iliacus muscles with mild adjacent soft tissue edema. Overall
appearance is concerning for infectious myositis versus less likely
muscle strain and intramuscular hemorrhage. Subtle small
hypodensities within the iliopsoas muscle measuring up to 13 mm
concerning for an intramuscular abscess. The lumbar spine
demonstrates no focal abnormality to suggest discitis or
osteomyelitis, but MRI of the lumbar spine without and with
intravenous contrast is recommended for increased sensitivity if
there is concern for discitis/osteomyelitis.
2. T-type IUD within the uterus. A tip of 'T' has perforated through
the uterine wall and projects into the peroneal cavity.
3. No acute cardiopulmonary disease.

ADDENDUM:
Not mentioned above:

Thickening of the intercostal muscle between the right anterior
first and second ribs with adjacent pleural reaction and soft tissue
edema in the adjacent subcutaneous fat likely reflecting an
inflammatory process given the patient's abnormalities along the
right iliopsoas muscles and right sacroiliitis. No drainable fluid
collection.

These findings were discussed with Dr. DELOUG.

*** End of Addendum ***
FINDINGS: CTA CHEST FINDINGS

Cardiovascular: Satisfactory opacification of the pulmonary arteries
to the segmental level. No evidence of pulmonary embolism. Normal
heart size. No pericardial effusion.

Mediastinum/Nodes: No enlarged mediastinal, hilar, or axillary lymph
nodes. Thyroid gland, trachea, and esophagus demonstrate no
significant findings.

Lungs/Pleura: Lungs are clear. No pleural effusion or pneumothorax.

Musculoskeletal: No chest wall abnormality. No acute or significant
osseous findings.

Review of the MIP images confirms the above findings.

CT ABDOMEN and PELVIS FINDINGS

Hepatobiliary: No focal liver abnormality is seen. No gallstones,
gallbladder wall thickening, or biliary dilatation.

Pancreas: Unremarkable. No pancreatic ductal dilatation or
surrounding inflammatory changes.

Spleen: Normal in size without focal abnormality.

Adrenals/Urinary Tract: Adrenal glands are unremarkable. Kidneys are
normal, without renal calculi, focal lesion, or hydronephrosis.
Bladder is unremarkable.

Stomach/Bowel: Stomach is within normal limits. Appendix appears
normal. No evidence of bowel wall thickening, distention, or
inflammatory changes.

Vascular/Lymphatic: No significant vascular findings are present. No
enlarged abdominal or pelvic lymph nodes.

Reproductive: No adnexal mass. T-type IUD within the uterus. A tip
of 'T' has perforated through the uterine wall and projects into the
peroneal cavity.

Other: No abdominal wall hernia or abnormality. No abdominopelvic
ascites.

Musculoskeletal: No acute osseous abnormality. No aggressive osseous
lesion. Vertebral body heights are maintained and are in normal
anatomic alignment. No bone destruction or periosteal reaction. Mild
osteoarthritis of bilateral SI joints. Heterogeneous enhancement and
severe expansion of the right psoas and iliacus muscles with mild
adjacent soft tissue edema.

Review of the MIP images confirms the above findings.
IMPRESSION: 1. Heterogeneous enhancement and severe expansion of the right psoas
and iliacus muscles with mild adjacent soft tissue edema. Overall
appearance is concerning for infectious myositis versus less likely
muscle strain and intramuscular hemorrhage. Subtle small
hypodensities within the iliopsoas muscle measuring up to 13 mm
concerning for an intramuscular abscess. The lumbar spine
demonstrates no focal abnormality to suggest discitis or
osteomyelitis, but MRI of the lumbar spine without and with
intravenous contrast is recommended for increased sensitivity if
there is concern for discitis/osteomyelitis.
2. T-type IUD within the uterus. A tip of 'T' has perforated through
the uterine wall and projects into the peroneal cavity.
3. No acute cardiopulmonary disease.

## 2019-08-02 IMAGING — CR DG LUMBAR SPINE 2-3V
1 series · 3 of 3 positions shown · non-contrast
Comparison: [DATE].

CLINICAL DATA: Radicular back pain to the right lower extremity, no
injury.

EXAM:
LUMBAR SPINE - 2-3 VIEW

[Series 1: dg lumbar spine 2-3 views · 0.14mm/px · 3 of 3 slices shown]
[im 1/3]
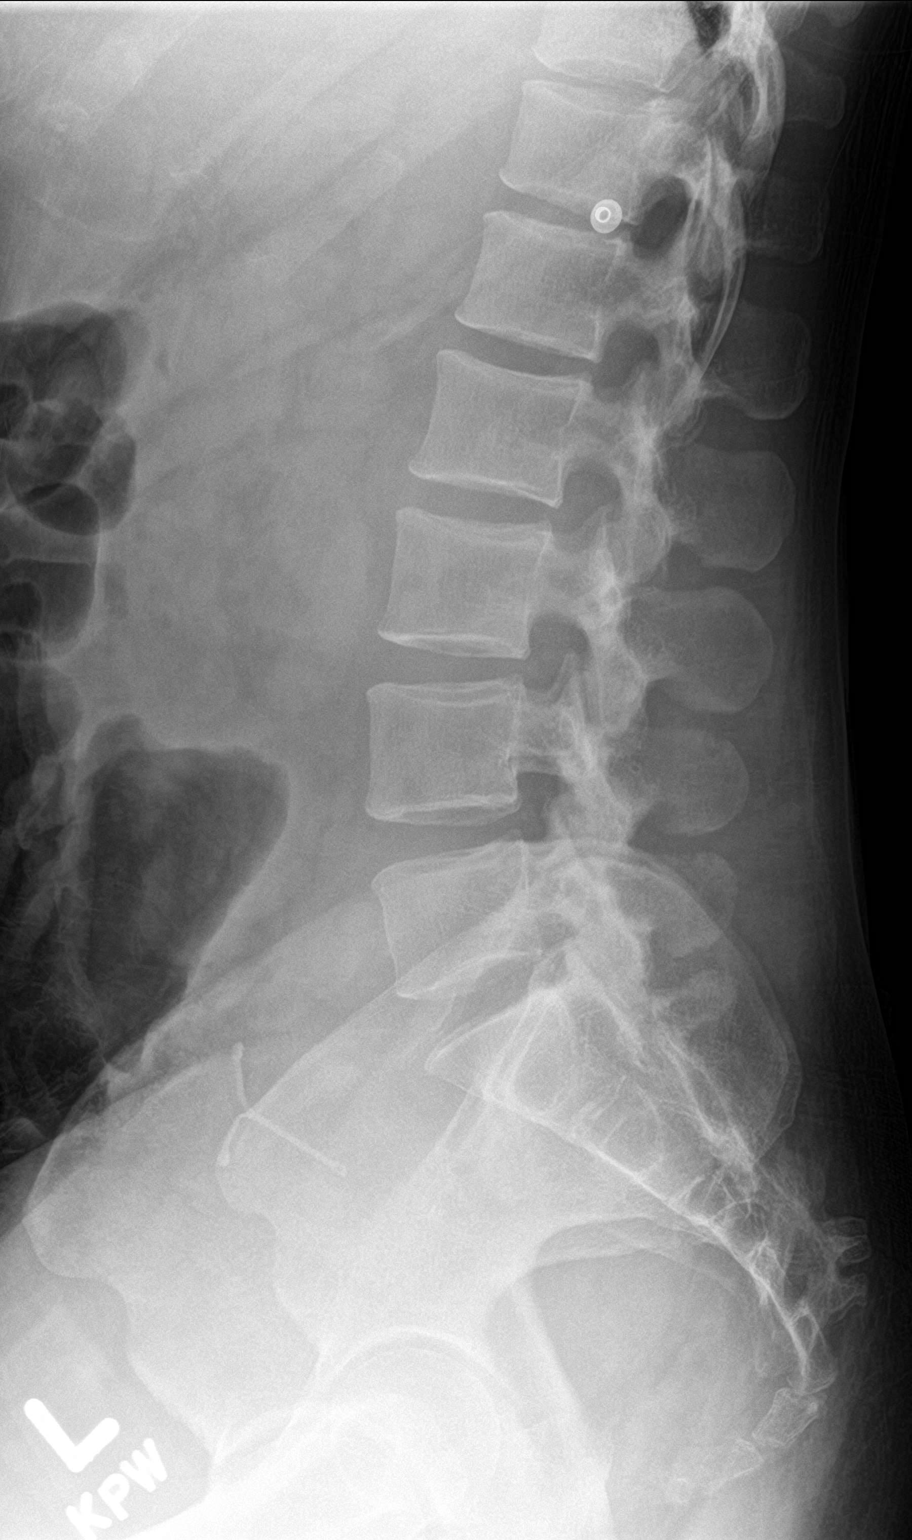
[im 2/3]
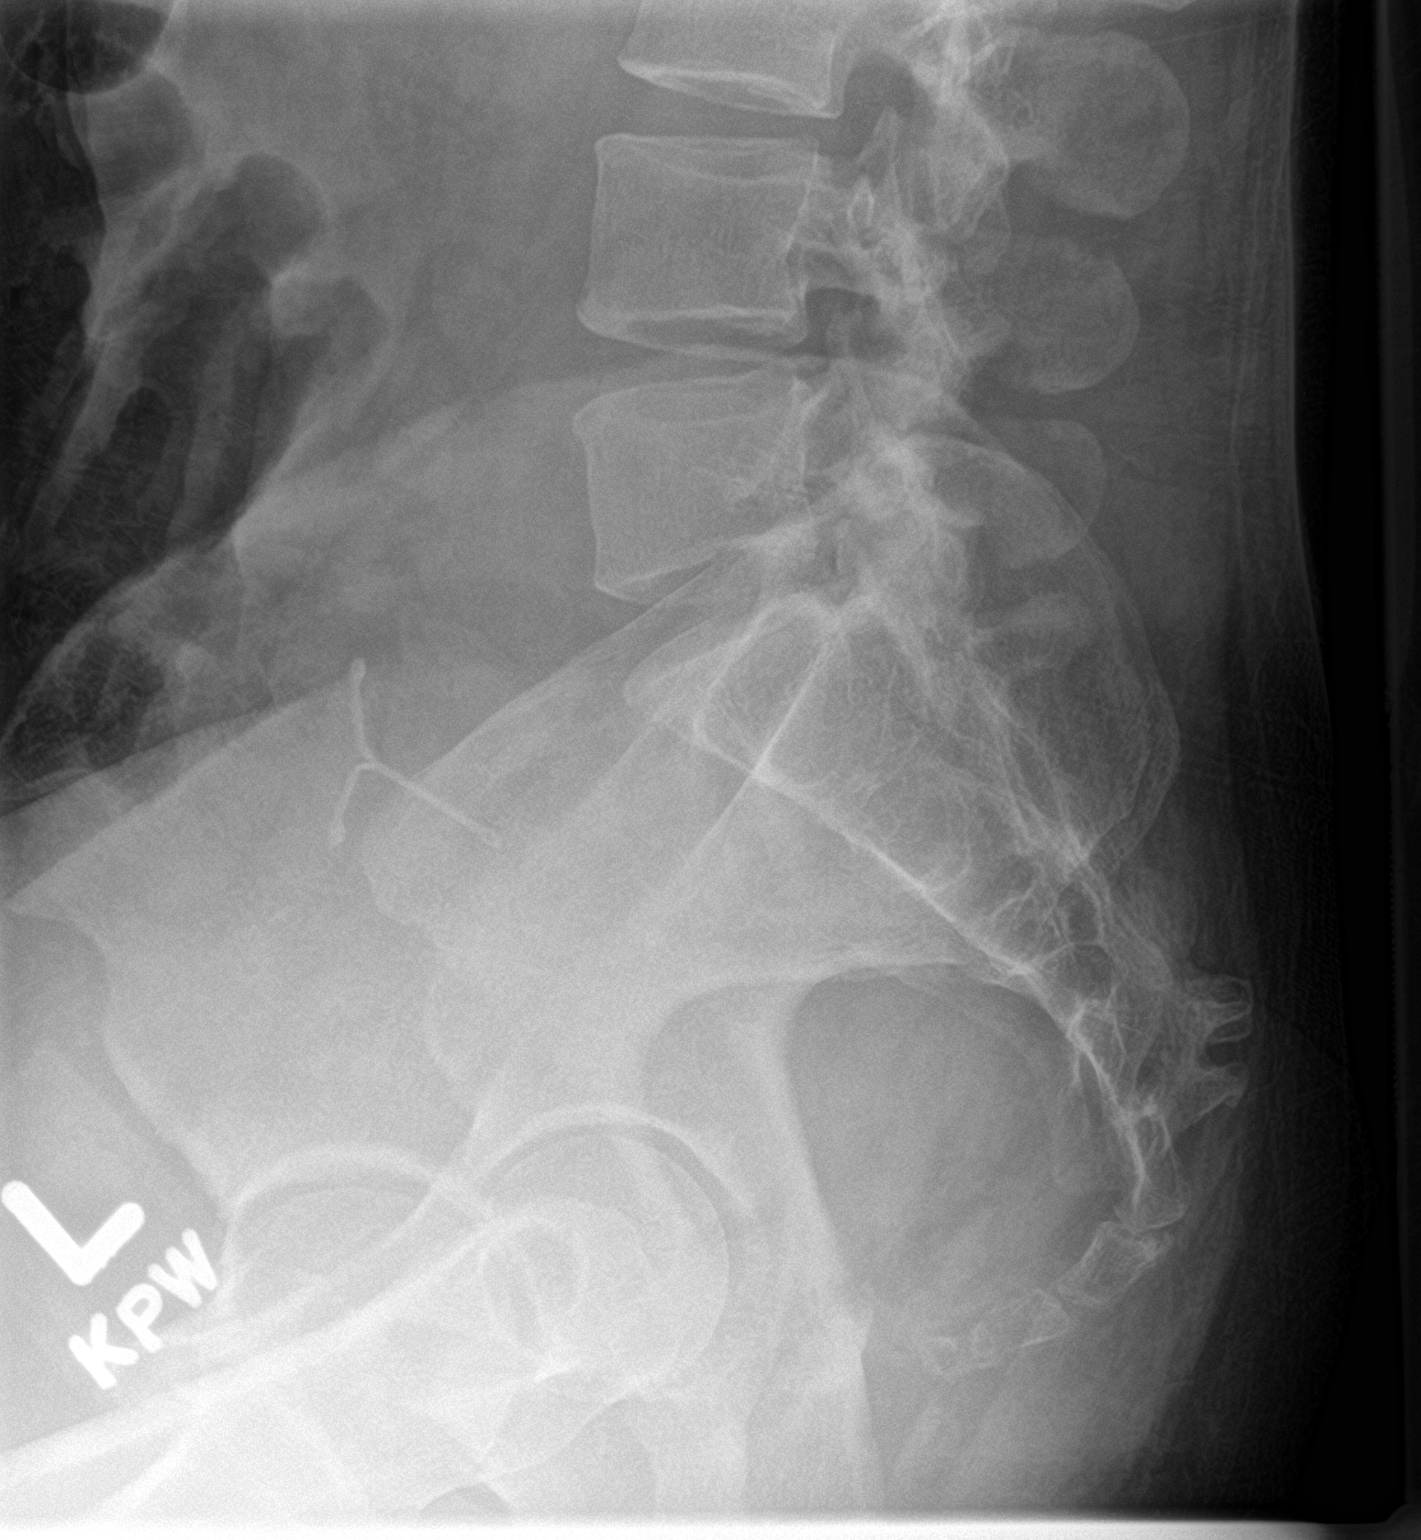
[im 3/3]
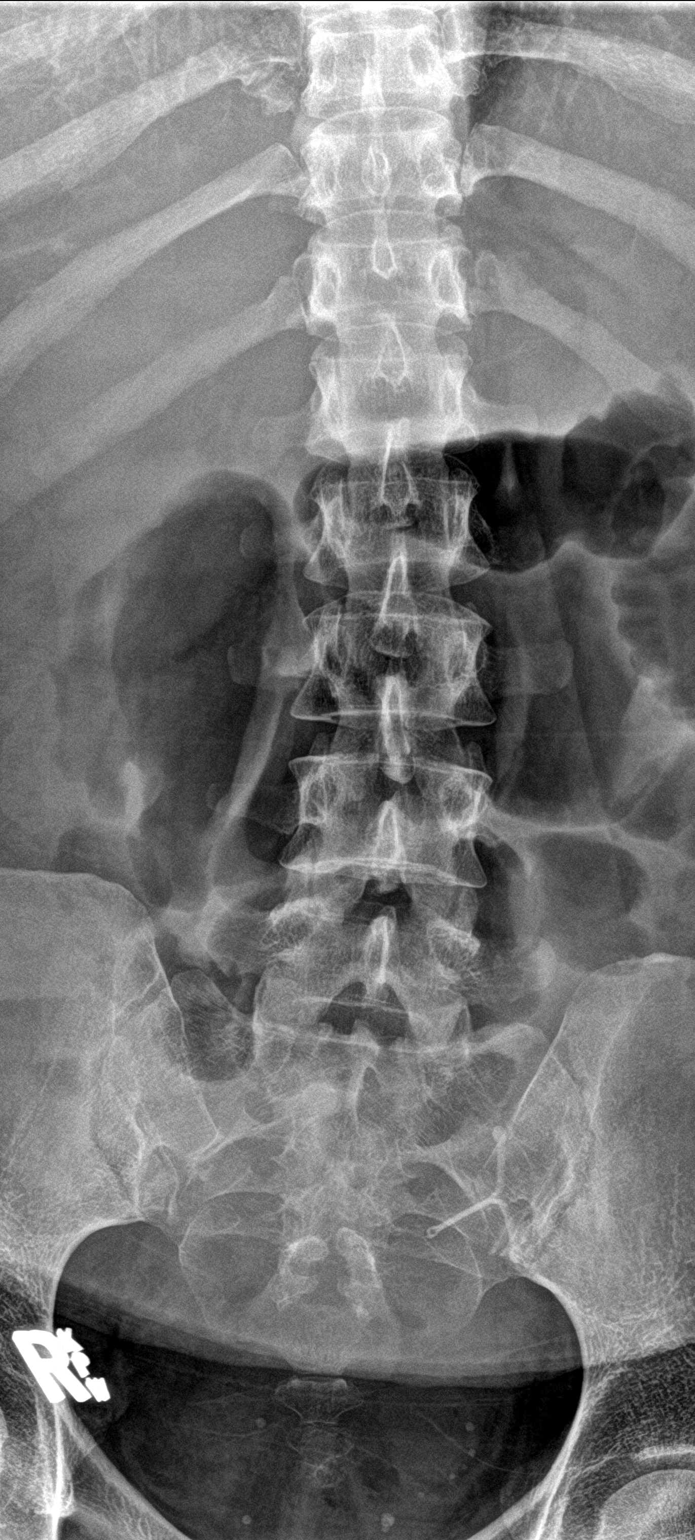

[3 of 3 positions shown; findings below may reference images not displayed]

FINDINGS: Very mild levoconvex curvature of the lumbar spine may be
positional. Alignment is otherwise anatomic. Vertebral body and disc
space height are normal. Mild facet sclerosis in the lower lumbar
spine. Intrauterine contraceptive device is incidentally noted.
IMPRESSION: Mild facet sclerosis at L5-S1.

## 2019-08-02 MED ORDER — VANCOMYCIN HCL 500 MG/100ML IV SOLN
500.0000 mg | Freq: Once | INTRAVENOUS | Status: DC
  Filled 2019-08-02: qty 100

## 2019-08-02 MED ORDER — MIDAZOLAM HCL 2 MG/2ML IJ SOLN
INTRAMUSCULAR | Status: AC | PRN
  Administered 2019-08-02 (×2): 1 mg via INTRAVENOUS

## 2019-08-02 MED ORDER — SODIUM CHLORIDE 0.9 % IV SOLN
2.0000 g | Freq: Once | INTRAVENOUS | Status: AC
  Administered 2019-08-02: 14:00:00 2 g via INTRAVENOUS
  Filled 2019-08-02: qty 2

## 2019-08-02 MED ORDER — FENTANYL CITRATE (PF) 100 MCG/2ML IJ SOLN
INTRAMUSCULAR | Status: AC
  Filled 2019-08-02: qty 2

## 2019-08-02 MED ORDER — MIDAZOLAM HCL 2 MG/2ML IJ SOLN
INTRAMUSCULAR | Status: AC
  Filled 2019-08-02: qty 4

## 2019-08-02 MED ORDER — METRONIDAZOLE IN NACL 5-0.79 MG/ML-% IV SOLN
500.0000 mg | Freq: Once | INTRAVENOUS | Status: AC
  Administered 2019-08-02: 500 mg via INTRAVENOUS
  Filled 2019-08-02: qty 100

## 2019-08-02 MED ORDER — HYDROMORPHONE HCL 1 MG/ML IJ SOLN
1.0000 mg | INTRAMUSCULAR | Status: AC | PRN
  Administered 2019-08-02 – 2019-08-03 (×3): 1 mg via INTRAVENOUS
  Filled 2019-08-02 (×3): qty 1

## 2019-08-02 MED ORDER — IBUPROFEN 800 MG PO TABS
800.0000 mg | ORAL_TABLET | Freq: Once | ORAL | Status: AC
  Administered 2019-08-02: 800 mg via ORAL
  Filled 2019-08-02: qty 1

## 2019-08-02 MED ORDER — LACTATED RINGERS IV BOLUS
1000.0000 mL | Freq: Once | INTRAVENOUS | Status: AC
  Administered 2019-08-02: 1000 mL via INTRAVENOUS

## 2019-08-02 MED ORDER — CHLORHEXIDINE GLUCONATE CLOTH 2 % EX PADS
6.0000 | MEDICATED_PAD | Freq: Every day | CUTANEOUS | Status: DC
  Administered 2019-08-02 – 2019-08-03 (×2): 6 via TOPICAL

## 2019-08-02 MED ORDER — SODIUM CHLORIDE 0.9 % IV SOLN
2.0000 g | Freq: Three times a day (TID) | INTRAVENOUS | Status: DC
  Administered 2019-08-02 – 2019-08-03 (×3): 2 g via INTRAVENOUS
  Filled 2019-08-02 (×6): qty 2

## 2019-08-02 MED ORDER — SODIUM CHLORIDE 0.9 % IV BOLUS
1000.0000 mL | Freq: Once | INTRAVENOUS | Status: AC
  Administered 2019-08-02: 1000 mL via INTRAVENOUS

## 2019-08-02 MED ORDER — ACETAMINOPHEN 500 MG PO TABS
1000.0000 mg | ORAL_TABLET | Freq: Once | ORAL | Status: AC
  Administered 2019-08-02: 18:00:00 1000 mg via ORAL
  Filled 2019-08-02: qty 2

## 2019-08-02 MED ORDER — HYDROMORPHONE HCL 1 MG/ML IJ SOLN
1.0000 mg | Freq: Once | INTRAMUSCULAR | Status: AC
  Administered 2019-08-02: 1 mg via INTRAVENOUS
  Filled 2019-08-02: qty 1

## 2019-08-02 MED ORDER — ENOXAPARIN SODIUM 40 MG/0.4ML ~~LOC~~ SOLN
40.0000 mg | SUBCUTANEOUS | Status: DC
  Administered 2019-08-02: 40 mg via SUBCUTANEOUS
  Filled 2019-08-02: qty 0.4

## 2019-08-02 MED ORDER — DEXTROSE-NACL 5-0.45 % IV SOLN: INTRAVENOUS | Status: DC

## 2019-08-02 MED ORDER — FENTANYL CITRATE (PF) 100 MCG/2ML IJ SOLN
INTRAMUSCULAR | Status: AC
  Administered 2019-08-03: 50 ug via INTRAVENOUS
  Filled 2019-08-02: qty 2

## 2019-08-02 MED ORDER — SODIUM CHLORIDE 0.9 % IV SOLN
INTRAVENOUS | Status: AC | PRN
  Administered 2019-08-02: 20 mL/h via INTRAVENOUS

## 2019-08-02 MED ORDER — SODIUM CHLORIDE 0.9 % IV SOLN: 2.0000 g | Freq: Once | INTRAVENOUS | Status: DC

## 2019-08-02 MED ORDER — METRONIDAZOLE IN NACL 5-0.79 MG/ML-% IV SOLN: 500.0000 mg | Freq: Once | INTRAVENOUS | Status: DC

## 2019-08-02 MED ORDER — VANCOMYCIN HCL IN DEXTROSE 1-5 GM/200ML-% IV SOLN
1000.0000 mg | Freq: Once | INTRAVENOUS | Status: AC
  Administered 2019-08-02: 1000 mg via INTRAVENOUS
  Filled 2019-08-02: qty 200

## 2019-08-02 MED ORDER — VANCOMYCIN HCL 750 MG/150ML IV SOLN
750.0000 mg | Freq: Two times a day (BID) | INTRAVENOUS | Status: DC
  Administered 2019-08-03: 750 mg via INTRAVENOUS
  Filled 2019-08-02 (×2): qty 150

## 2019-08-02 MED ORDER — SODIUM CHLORIDE 0.9 % IV SOLN: INTRAVENOUS | Status: DC

## 2019-08-02 MED ORDER — IOHEXOL 350 MG/ML SOLN
75.0000 mL | Freq: Once | INTRAVENOUS | Status: AC | PRN
  Administered 2019-08-02: 75 mL via INTRAVENOUS
  Filled 2019-08-02: qty 75

## 2019-08-02 MED ORDER — POTASSIUM CHLORIDE CRYS ER 20 MEQ PO TBCR
40.0000 meq | EXTENDED_RELEASE_TABLET | Freq: Once | ORAL | Status: AC
  Administered 2019-08-02: 22:00:00 40 meq via ORAL
  Filled 2019-08-02: qty 2

## 2019-08-02 MED ORDER — ACETAMINOPHEN 325 MG PO TABS
650.0000 mg | ORAL_TABLET | Freq: Once | ORAL | Status: AC
  Administered 2019-08-02: 650 mg via ORAL
  Filled 2019-08-02: qty 2

## 2019-08-02 MED ORDER — MORPHINE SULFATE (PF) 2 MG/ML IV SOLN: 2.0000 mg | INTRAVENOUS | Status: DC | PRN

## 2019-08-02 MED ORDER — FENTANYL CITRATE (PF) 100 MCG/2ML IJ SOLN
INTRAMUSCULAR | Status: AC | PRN
  Administered 2019-08-02 (×2): 50 ug via INTRAVENOUS

## 2019-08-02 NOTE — H&P (Signed)
History and Physical    Leslie Molina [id number] DOB: 04/18/1986 DOA: 08/02/2019  PCP: [facility] [place]   Patient coming from: Home  I have personally briefly reviewed patient's old medical records in [facility]  Chief Complaint: Back pain  HPI: Leslie Molina is a 34 y.o. female with medical history significant for bipolar disorder, anxiety, history of IV drug use presents to the emergency room twice within 1 week and lower back pain as well as worsening pain in her right lower extremity.  Was seen in the emergency room about a week ago and was discharged on gabapentin.  She was febrile during that visit had a Covid test done and was asked to quarantine until the results became available.  She returns to the emergency room today febrile with a T-max of 102F, she is also tachycardic and acutely ill-appearing.  She complains of worsening pain in her right extremity which she rated 10 out of 10 in intensity at its worse.  She denied having any trauma.  Denied having any sick contacts.  She denied having any cough, shortness of breath, nausea, vomiting, diaphoresis or palpitations.  She denied having any abdominal pain, no frequency of urination, dysuria or nocturia. She has not been able to bear weight on her right leg. She had an MRI of the right hip which showed extensive right iliopsoas abscess.  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  Myositis of the gluteus medius, maximus and minimus.  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  The abscess extends through the posterior aspect of the proximal right thigh  ED Course: The emergency room she was noted to be tachycardic and febrile with a T-max of 102 F.  Blood pressure was stable.  She received fluids per sepsis protocol Emergency room provider discussed the case with general surgery and orthopedics, and they both feel the patient will need a higher level of care.  She consulted interventional  radiology who was able to aspirate some purulent material and send to the lab  Review of Systems: As per HPI otherwise 10 point review of systems negative.    Past Medical History:  Diagnosis Date  . Anxiety   . Bipolar 1 disorder ([place])   . Depression   . Migraine   . Nerve damage     Past Surgical History:  Procedure Laterality Date  . CESAREAN SECTION       reports that she has been smoking cigarettes. She has been smoking about 1.00 pack per day. She has never used smokeless tobacco. She reports that she does not drink alcohol or use drugs.  No Known Allergies  Family History  Problem Relation Age of Onset  . Depression Mother   . Bipolar disorder Mother   . Depression Father   . Bipolar disorder Father   . Depression Sister   . Bipolar disorder Sister      Prior to Admission medications   Medication Sig Start Date End Date Taking? Authorizing Provider  ALPRAZolam Duanne Moron) 0.5 MG tablet Take 0.5 mg by mouth 3 (three) times daily as needed. 12/26/14   [provider]  ALPRAZolam Duanne Moron) 1 MG tablet Take 1 mg by mouth 3 (three) times daily. 10/24/14   [provider]  gabapentin (NEURONTIN) 300 MG capsule Take 1 capsule (300 mg total) by mouth 3 (three) times daily. 07/29/19   Johnn Hai, PA-C  hydrOXYzine (VISTARIL) 50 MG capsule Take 100 mg by mouth at bedtime  as needed for anxiety (sleep).  12/08/14   [provider]  LATUDA 20 MG TABS Take 20 mg by mouth daily.  12/26/14   [provider]  QVAR 80 MCG/ACT inhaler INHALE 2 PUFFS EVERY 4 TO 6 HOURS AS NEEDED 12/08/14   [provider]  SEROQUEL XR 150 MG 24 hr tablet Take 150 mg by mouth every evening. 12/27/14   [provider]  simvastatin (ZOCOR) 40 MG tablet Take 40 mg by mouth daily. 12/08/14   [provider]  SPIRIVA HANDIHALER 18 MCG inhalation capsule INHALE 1 CAPSULE VIA HANDIHALER ONCE DAILY AT THE SAME TIME EVERY DAY 12/08/14   [provider]    SUMAtriptan (IMITREX) 50 MG tablet Take 50 mg by mouth See admin instructions. 11/29/14   [provider]  topiramate (TOPAMAX) 100 MG tablet Take 100 mg by mouth at bedtime.  12/08/14   [provider]  traMADol (ULTRAM) 50 MG tablet Take 1 tablet (50 mg total) by mouth every 6 (six) hours as needed for moderate pain. 07/29/19   Johnn Hai, PA-C  traZODone (DESYREL) 150 MG tablet Take 150 mg by mouth at bedtime. 12/27/14   [provider]    Physical Exam: Vitals:   08/02/19 1715 08/02/19 1735 08/02/19 1749 08/02/19 1800  BP: (!) 125/94 114/66  101/73  Pulse: (!) 134 (!) 141  (!) 143  Resp: (!) 49 (!) 28  (!) 28  Temp:   (!) 103.1 F (39.5 C)   TempSrc:   Oral   SpO2: 97% 93%  100%  Weight:      Height:         Vitals:   08/02/19 1715 08/02/19 1735 08/02/19 1749 08/02/19 1800  BP: (!) 125/94 114/66  101/73  Pulse: (!) 134 (!) 141  (!) 143  Resp: (!) 49 (!) 28  (!) 28  Temp:   (!) 103.1 F (39.5 C)   TempSrc:   Oral   SpO2: 97% 93%  100%  Weight:      Height:        Constitutional: NAD, alert and oriented to person, place and time Eyes: PERRL, lids and conjunctivae normal ENMT: Mucous membranes are moist.  Neck: normal, supple, no masses, no thyromegaly Respiratory: clear to auscultation bilaterally, no wheezing, no crackles. Tachypnic, No accessory muscle use. Cardiovascular: Tachycardic, no murmurs / rubs / gallops. No extremity edema. 2+ pedal pulses. No carotid bruits.  Abdomen: Distended, no tenderness, no masses palpated. No hepatosplenomegaly. Bowel sounds positive.  Musculoskeletal: no clubbing / cyanosis. Decrease range of motion involving the rt hip, increased differential warmth involving the right hip and buttock Skin: Erythematous plaques over lower extremities, lesions, ulcers.  Neurologic: No gross focal neurologic deficit Psychiatric: Normal mood and affect.   Labs on Admission: I have personally reviewed following labs and  imaging studies  CBC: Recent Labs  Lab 08/02/19 0843  WBC 14.0*  NEUTROABS 12.3*  HGB 11.8*  HCT 33.7*  MCV 82.0  PLT 123456   Basic Metabolic Panel: Recent Labs  Lab 08/02/19 0843  NA 128*  K 3.0*  CL 89*  CO2 24  GLUCOSE 133*  BUN 11  CREATININE 0.60  CALCIUM 8.4*   GFR: Estimated Creatinine Clearance: 86 mL/min (by C-G formula based on SCr of 0.6 mg/dL). Liver Function Tests: Recent Labs  Lab 08/02/19 0843  AST 30  ALT 24  ALKPHOS 103  BILITOT 0.5  PROT 7.2  ALBUMIN 2.9*   No results for  input(s): LIPASE, AMYLASE in the last 168 hours. No results for input(s): AMMONIA in the last 168 hours. Coagulation Profile: No results for input(s): INR, PROTIME in the last 168 hours. Cardiac Enzymes: Recent Labs  Lab 08/02/19 0843  CKTOTAL 410*   BNP (last 3 results) No results for input(s): PROBNP in the last 8760 hours. HbA1C: No results for input(s): HGBA1C in the last 72 hours. CBG: No results for input(s): GLUCAP in the last 168 hours. Lipid Profile: No results for input(s): CHOL, HDL, LDLCALC, TRIG, CHOLHDL, LDLDIRECT in the last 72 hours. Thyroid Function Tests: No results for input(s): TSH, T4TOTAL, FREET4, T3FREE, THYROIDAB in the last 72 hours. Anemia Panel: No results for input(s): VITAMINB12, FOLATE, FERRITIN, TIBC, IRON, RETICCTPCT in the last 72 hours. Urine analysis:    Component Value Date/Time   COLORURINE YELLOW (A) 08/02/2019 0917   APPEARANCEUR CLEAR (A) 08/02/2019 0917   APPEARANCEUR Clear 04/03/2014 0059   LABSPEC 1.008 08/02/2019 0917   LABSPEC 1.016 04/03/2014 0059   PHURINE 6.0 08/02/2019 0917   GLUCOSEU NEGATIVE 08/02/2019 0917   GLUCOSEU Negative 04/03/2014 0059   HGBUR NEGATIVE 08/02/2019 0917   BILIRUBINUR NEGATIVE 08/02/2019 0917   BILIRUBINUR Negative 04/03/2014 0059   KETONESUR NEGATIVE 08/02/2019 0917   PROTEINUR NEGATIVE 08/02/2019 0917   NITRITE NEGATIVE 08/02/2019 0917   LEUKOCYTESUR NEGATIVE 08/02/2019 0917    LEUKOCYTESUR Negative 04/03/2014 0059    Radiological Exams on Admission: DG Lumbar Spine 2-3 Views  Result Date: 08/02/2019 CLINICAL DATA:  Radicular back pain to the right lower extremity, no injury. EXAM: LUMBAR SPINE - 2-3 VIEW COMPARISON:  06/10/2013. FINDINGS: Very mild levoconvex curvature of the lumbar spine may be positional. Alignment is otherwise anatomic. Vertebral body and disc space height are normal. Mild facet sclerosis in the lower lumbar spine. Intrauterine contraceptive device is incidentally noted. IMPRESSION: Mild facet sclerosis at L5-S1. Electronically Signed   By: Lorin Picket M.D.   On: 08/02/2019 09:59   CT Angio Chest PE W and/or Wo Contrast  Result Date: 08/02/2019 CLINICAL DATA:  Shortness of breath, severe back pain radiating down the right leg EXAM: CT ANGIOGRAPHY CHEST CT ABDOMEN AND PELVIS WITH CONTRAST TECHNIQUE: Multidetector CT imaging of the chest was performed using the standard protocol during bolus administration of intravenous contrast. Multiplanar CT image reconstructions and MIPs were obtained to evaluate the vascular anatomy. Multidetector CT imaging of the abdomen and pelvis was performed using the standard protocol during bolus administration of intravenous contrast. CONTRAST:  40mL OMNIPAQUE IOHEXOL 350 MG/ML SOLN COMPARISON:  None. FINDINGS: CTA CHEST FINDINGS Cardiovascular: Satisfactory opacification of the pulmonary arteries to the segmental level. No evidence of pulmonary embolism. Normal heart size. No pericardial effusion. Mediastinum/Nodes: No enlarged mediastinal, hilar, or axillary lymph nodes. Thyroid gland, trachea, and esophagus demonstrate no significant findings. Lungs/Pleura: Lungs are clear. No pleural effusion or pneumothorax. Musculoskeletal: No chest wall abnormality. No acute or significant osseous findings. Review of the MIP images confirms the above findings. CT ABDOMEN and PELVIS FINDINGS Hepatobiliary: No focal liver abnormality is  seen. No gallstones, gallbladder wall thickening, or biliary dilatation. Pancreas: Unremarkable. No pancreatic ductal dilatation or surrounding inflammatory changes. Spleen: Normal in size without focal abnormality. Adrenals/Urinary Tract: Adrenal glands are unremarkable. Kidneys are normal, without renal calculi, focal lesion, or hydronephrosis. Bladder is unremarkable. Stomach/Bowel: Stomach is within normal limits. Appendix appears normal. No evidence of bowel wall thickening, distention, or inflammatory changes. Vascular/Lymphatic: No significant vascular findings are present. No enlarged abdominal or pelvic lymph nodes. Reproductive: No  adnexal mass. T-type IUD within the uterus. A tip of 'T' has perforated through the uterine wall and projects into the peroneal cavity. Other: No abdominal wall hernia or abnormality. No abdominopelvic ascites. Musculoskeletal: No acute osseous abnormality. No aggressive osseous lesion. Vertebral body heights are maintained and are in normal anatomic alignment. No bone destruction or periosteal reaction. Mild osteoarthritis of bilateral SI joints. Heterogeneous enhancement and severe expansion of the right psoas and iliacus muscles with mild adjacent soft tissue edema. Review of the MIP images confirms the above findings. IMPRESSION: 1. Heterogeneous enhancement and severe expansion of the right psoas and iliacus muscles with mild adjacent soft tissue edema. Overall appearance is concerning for infectious myositis versus less likely muscle strain and intramuscular hemorrhage. Subtle small hypodensities within the iliopsoas muscle measuring up to 13 mm concerning for an intramuscular abscess. The lumbar spine demonstrates no focal abnormality to suggest discitis or osteomyelitis, but MRI of the lumbar spine without and with intravenous contrast is recommended for increased sensitivity if there is concern for discitis/osteomyelitis. 2. T-type IUD within the uterus. A tip of 'T'  has perforated through the uterine wall and projects into the peroneal cavity. 3. No acute cardiopulmonary disease. Electronically Signed   By: Kathreen Devoid   On: 08/02/2019 12:09   CT GUIDED NEEDLE PLACEMENT  Result Date: 08/02/2019 INDICATION: History of intravenous drug abuse, now with right pelvic/retroperitoneal abscess. Please perform CT-guided aspiration and/or drainage catheter placement. EXAM: CT-GUIDED RIGHT ILIACUS MUSCULAR ABSCESS ASPIRATION COMPARISON:  CT abdomen pelvis-earlier same day; lumbar spine and right hip MRI-earlier same MEDICATIONS: None ANESTHESIA/SEDATION: Moderate (conscious) sedation was employed during this procedure. A total of Versed 3 mg and Fentanyl 150 mcg was administered intravenously. Moderate Sedation Time: 34 minutes. The patient's level of consciousness and vital signs were monitored continuously by radiology nursing throughout the procedure under my direct supervision. CONTRAST:  None COMPLICATIONS: None immediate. PROCEDURE: Informed written consent was obtained from the patient after a discussion of the risks, benefits and alternatives to treatment. The patient was placed supine on the CT gantry and a pre procedural CT was performed re-demonstrating the known abscess/fluid collection within the right iliacus musculature with dominant ill-defined component measuring approximately 2.4 x 2.0 cm (image 60, series 2). The procedure was planned. A timeout was performed prior to the initiation of the procedure. The skin overlying the inferior anterior aspect of the right lower abdomen/pelvis was prepped and draped in the usual sterile fashion. The overlying soft tissues were anesthetized with 1% lidocaine with epinephrine. Appropriate trajectory was planned with the use of a 22 gauge spinal needle. An 18 gauge trocar needle was advanced into the abscess/fluid collection. Appropriate positioning was confirmed with a limited CT scan. Given the small size of the collection as  well as lack of peripheral wall enhancement on preceding contrast-enhanced abdominal CT, the decision was made to pursue aspiration at this time. Next, approximately 3 cc of purulent appearing fluid was aspirated as the trocar needle was slowly retracted. All aspirated fluid was capped and sent to the laboratory for analysis. A dressing was applied. The patient tolerated the procedure well without immediate post procedural complication. IMPRESSION: Successful CT guided aspiration of approximately 3 cc of purulent appearing fluid from right iliacus intramuscular abscess, currently not amenable to image guided drainage catheter placement. All aspirated fluid was capped and sent to the laboratory as requested by the ordering clinical team. Electronically Signed   By: Sandi Mariscal M.D.   On: 08/02/2019 17:53  MR LUMBAR SPINE WO CONTRAST  Result Date: 08/02/2019 CLINICAL DATA:  Low back pain with radiculopathy. No recent injury EXAM: MRI LUMBAR SPINE WITHOUT CONTRAST TECHNIQUE: Multiplanar, multisequence MR imaging of the lumbar spine was performed. No intravenous contrast was administered. COMPARISON:  CT 08/02/2019 FINDINGS: Technical note: Examination is slightly degraded by patient motion artifact. Patient was unable to tolerate any further imaging and no postcontrast sequences were able to be performed. Segmentation:  Standard. Alignment:  Physiologic. Vertebrae:  No fracture, evidence of discitis, or bone lesion. Conus medullaris and cauda equina: Conus extends to the L2 level. Conus and cauda equina appear normal. Paraspinal and other soft tissues: Expansion and extensive fluid and edema signal within the right psoas muscle as well as the visualized portion of the right iliacus muscle. Fluid collection within the medial aspect of the right iliacus muscle measures up to 2.2 x 1.3 cm in transaxial dimension. The craniocaudal extent of the collection is not entirely included within the field of view. Fluid signal  is seen within both the medial iliacus and or distal psoas muscle at the edge of the field of view which appears to extend from the right sacroiliac joint (series 7, image 36). There is patchy bone marrow edema within the right sacrum adjacent to the right SI joint. Visualized portion of the left psoas muscle is normal. The posterior paraspinal musculature is normal. The canal from the L4-5 level and caudally is slightly heterogeneous, which is felt to most likely be secondary to motion artifact and prominence of the epidural fat. No definite epidural fluid collection. Disc levels: T12-L1: No significant disc protrusion, foraminal stenosis, or canal stenosis. L1-L2: No significant disc protrusion, foraminal stenosis, or canal stenosis. L2-L3: No significant disc protrusion, foraminal stenosis, or canal stenosis. L3-L4: No significant disc protrusion, foraminal stenosis, or canal stenosis. L4-L5: No significant disc protrusion, foraminal stenosis, or canal stenosis. L5-S1: No significant disc protrusion, foraminal stenosis, or canal stenosis. IMPRESSION: 1. Limited study due to patient motion artifact. Patient was unable to tolerate any postcontrast imaging. 2. 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 Please see dedicated MRI of the pelvis and right hip which more fully images the musculature for further detail. 3. Negative for osteomyelitis-discitis. 4. Slightly heterogeneous appearance of the distal lumbar canal below the L4-5 level on STIR sequences, which is favored to be secondary to a combination of prominent epidural fat and motion artifact. Postcontrast imaging would be helpful to exclude the presence of epidural extension of infection. 5. No significant disc disease. No evidence of nerve impingement. Electronically Signed   By: Davina Poke D.O.   On: 08/02/2019 13:40   CT ABDOMEN  PELVIS W CONTRAST  Result Date: 08/02/2019 CLINICAL DATA:  Shortness of breath, severe back pain radiating down the right leg EXAM: CT ANGIOGRAPHY CHEST CT ABDOMEN AND PELVIS WITH CONTRAST TECHNIQUE: Multidetector CT imaging of the chest was performed using the standard protocol during bolus administration of intravenous contrast. Multiplanar CT image reconstructions and MIPs were obtained to evaluate the vascular anatomy. Multidetector CT imaging of the abdomen and pelvis was performed using the standard protocol during bolus administration of intravenous contrast. CONTRAST:  22mL OMNIPAQUE IOHEXOL 350 MG/ML SOLN COMPARISON:  None. FINDINGS: CTA CHEST FINDINGS Cardiovascular: Satisfactory opacification of the pulmonary arteries to the segmental level. No evidence of pulmonary embolism. Normal heart size. No pericardial effusion. Mediastinum/Nodes: No enlarged mediastinal, hilar, or axillary lymph nodes. Thyroid gland, trachea,  and esophagus demonstrate no significant findings. Lungs/Pleura: Lungs are clear. No pleural effusion or pneumothorax. Musculoskeletal: No chest wall abnormality. No acute or significant osseous findings. Review of the MIP images confirms the above findings. CT ABDOMEN and PELVIS FINDINGS Hepatobiliary: No focal liver abnormality is seen. No gallstones, gallbladder wall thickening, or biliary dilatation. Pancreas: Unremarkable. No pancreatic ductal dilatation or surrounding inflammatory changes. Spleen: Normal in size without focal abnormality. Adrenals/Urinary Tract: Adrenal glands are unremarkable. Kidneys are normal, without renal calculi, focal lesion, or hydronephrosis. Bladder is unremarkable. Stomach/Bowel: Stomach is within normal limits. Appendix appears normal. No evidence of bowel wall thickening, distention, or inflammatory changes. Vascular/Lymphatic: No significant vascular findings are present. No enlarged abdominal or pelvic lymph nodes. Reproductive: No adnexal mass. T-type  IUD within the uterus. A tip of 'T' has perforated through the uterine wall and projects into the peroneal cavity. Other: No abdominal wall hernia or abnormality. No abdominopelvic ascites. Musculoskeletal: No acute osseous abnormality. No aggressive osseous lesion. Vertebral body heights are maintained and are in normal anatomic alignment. No bone destruction or periosteal reaction. Mild osteoarthritis of bilateral SI joints. Heterogeneous enhancement and severe expansion of the right psoas and iliacus muscles with mild adjacent soft tissue edema. Review of the MIP images confirms the above findings. IMPRESSION: 1. Heterogeneous enhancement and severe expansion of the right psoas and iliacus muscles with mild adjacent soft tissue edema. Overall appearance is concerning for infectious myositis versus less likely muscle strain and intramuscular hemorrhage. Subtle small hypodensities within the iliopsoas muscle measuring up to 13 mm concerning for an intramuscular abscess. The lumbar spine demonstrates no focal abnormality to suggest discitis or osteomyelitis, but MRI of the lumbar spine without and with intravenous contrast is recommended for increased sensitivity if there is concern for discitis/osteomyelitis. 2. T-type IUD within the uterus. A tip of 'T' has perforated through the uterine wall and projects into the peroneal cavity. 3. No acute cardiopulmonary disease. Electronically Signed   By: Kathreen Devoid   On: 08/02/2019 12:09   MR HIP RIGHT WO CONTRAST  Result Date: 08/02/2019 CLINICAL DATA:  Severe back pain radiating down the right hip and leg. Fever. EXAM: MR OF THE RIGHT HIP WITHOUT CONTRAST TECHNIQUE: Multiplanar, multisequence MR imaging was performed. No intravenous contrast was administered. COMPARISON:  CT scan of the abdomen and pelvis dated 08/02/2019 FINDINGS: Bones: There is abnormal edema in the right side of the sacrum and in the right iliac bone with fluid in the right SI joint, likely  representing osteomyelitis and sepsis of the right sacroiliac joint. Articular cartilage and labrum Articular cartilage:  Normal. Labrum:  Normal. Joint or bursal effusion Joint effusion:  Trace joint effusion. Bursae: No bursitis. Muscles and tendons Muscles and tendons: Extensive right iliopsoas abscess extending from at least the L3 level to the insertion of the iliopsoas tendon on the lesser trochanter. Is extensive edema in the iliopsoas muscle consistent with myositis. There is also edema in 1 of the right hip abductor muscles with fluid along the fascia of the muscles in the anterior aspect of the proximal right thigh likely representing infectious fasciitis. There is also extension of abscess into the right gluteal muscles through the a posterior aspect of the right sacroiliac joint seen on images 1 and 2 of series 19. There is edema in the gluteus minimus, medius, and maximus muscles consistent with myositis. There is edema in the right piriformis muscle consistent with myositis seen on image 8 of series 19. Other findings Miscellaneous:  Distended urinary bladder. IMPRESSION: 1. Extensive right iliopsoas abscess extending from at least the L3-4 level to the insertion of the iliopsoas tendon on the lesser trochanter. 2. 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 3. Myositis of the right gluteus minimus, medius, and maximus and right piriformis muscles. 4. 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 5. Abscess extends through the posterior aspect of the right SI joint into the gluteal musculature. 6. Fluid along the fascial planes in the anterior aspect of the proximal right thigh likely representing infectious fasciitis. 7. MRI with contrast may better define the extent of the infection and provide a baseline for a serial evaluation. Electronically Signed   By: Lorriane Shire M.D.   On: 08/02/2019 13:45    EKG: Independently reviewed.  Sinus  tachycardia  Assessment/Plan Principal Problem:   Sepsis ([place]) Active Problems:   Iliopsoas abscess on right ([facility])   Myositis   Hypokalemia    Sepsis Present on admission Patient was tachycardic, tachypneic with marked leukocytosis.  Blood pressure was stable and lactic acid within normal limits Source of sepsis appears to be multiple sepsis involving the iliopsoas muscle and the right gluteus muscle.  Patient has a history of IV drug abuse but denies any recent use. She received a dose of cefepime, vancomycin and Flagyl in the emergency room as well as sepsis fluid bolus Will place patient empirically on vancomycin for MRSA coverage and cefepime for gram-negative coverage Will request ID consult   Hyponatremia Most likely secondary to hypovolemia and poor oral intake Place patient on IV fluid hydration with normal saline Repeat sodium levels in a.m.   Hypokalemia Supplement potassium    Infectious myositis Continue IV antibiotic therapy Follow-up culture results   Will consult GYN to evaluate patient for perforation of the uterine wall from her IUD  DVT prophylaxis: Lovenox Code Status: Full code Family Communication: Plan of care discussed with patient.  Verbalizes understanding and agrees with the plan of care Disposition Plan: Back to previous home environment Consults called: ID, Surgery, OB/GYN    Collier Bullock MD [facility]     08/02/2019, 6:27 PM

## 2019-08-02 NOTE — Progress Notes (Signed)
PHARMACY -  BRIEF ANTIBIOTIC NOTE   Pharmacy has received consult(s) for Cefepime and Vancomycin from an ED provider.  The patient's profile has been reviewed for ht/wt/allergies/indication/available labs.    One time order(s) placed by MD for Vancomycin and Cefepime  Further antibiotics/pharmacy consults should be ordered by admitting physician if indicated.                       Thank you, Jon Lall A 08/02/2019  11:59 AM

## 2019-08-02 NOTE — Progress Notes (Signed)
PHARMACY - PHYSICIAN COMMUNICATION CRITICAL VALUE ALERT - BLOOD CULTURE IDENTIFICATION (BCID)  Leslie Molina is an 34 y.o. female who presented to [facility] on 08/02/2019 with a chief complaint of fever  Assessment:  Lab reports 1 of 4 bottles w/ GPC  Name of physician (or Provider) Contacted: Jonny Ruiz, NP  Current antibiotics: Vancomycin and Cefepime  Changes to prescribed antibiotics recommended:  Patient is on recommended antibiotics - No changes needed  No results found for this or any previous visit.  Hart Robinsons A 08/02/2019  11:18 PM

## 2019-08-02 NOTE — ED Provider Notes (Signed)
[facility] Emergency Department Provider Note   ____________________________________________   First MD Initiated Contact with Patient 08/02/19 0815     (approximate)  I have reviewed the triage vital signs and the nursing notes.   HISTORY  Chief Complaint Back Pain    HPI Leslie Molina is a 34 y.o. female patient presents radicular low back pain today right lower extremity.  Patient was seen last week for the same complaint with no objective findings except for fever.  Patient continues to have fever.  Patient state onset of pain after she ran out of gabapentin 2 weeks ago.  Patient has restarted gabapentin from her last visit but is noticed no improvement.  Patient denies urinary frequency, urgency, or dysuria.  Patient denies URI signs and symptoms.  Patient tested negative for COVID-19 on last visit.  Patient rates her pain as a 10/10.  Patient described pain as "achy".  History of IV drug abuse.      Past Medical History:  Diagnosis Date  . Anxiety   . Bipolar 1 disorder ([place])   . Depression   . Migraine   . Nerve damage     Patient Active Problem List   Diagnosis Date Noted  . IUD migration 01/22/2015    Past Surgical History:  Procedure Laterality Date  . CESAREAN SECTION      Prior to Admission medications   Medication Sig Start Date End Date Taking? Authorizing Provider  ALPRAZolam Duanne Moron) 0.5 MG tablet Take 0.5 mg by mouth 3 (three) times daily as needed. 12/26/14   [provider]  ALPRAZolam Duanne Moron) 1 MG tablet Take 1 mg by mouth 3 (three) times daily. 10/24/14   [provider]  gabapentin (NEURONTIN) 300 MG capsule Take 1 capsule (300 mg total) by mouth 3 (three) times daily. 07/29/19   Johnn Hai, PA-C  hydrOXYzine (VISTARIL) 50 MG capsule TAKE 2 CAPSULES EACH NIGHT AT BEDTIME 12/08/14   [provider]  LATUDA 20 MG TABS Take 1 tablet by mouth daily. 12/26/14   [provider]  QVAR 80  MCG/ACT inhaler INHALE 2 PUFFS EVERY 4 TO 6 HOURS AS NEEDED 12/08/14   [provider]  SEROQUEL XR 150 MG 24 hr tablet Take 150 mg by mouth every evening. 12/27/14   [provider]  simvastatin (ZOCOR) 40 MG tablet Take 40 mg by mouth daily. 12/08/14   [provider]  SPIRIVA HANDIHALER 18 MCG inhalation capsule INHALE 1 CAPSULE VIA HANDIHALER ONCE DAILY AT THE SAME TIME EVERY DAY 12/08/14   [provider]  SUMAtriptan (IMITREX) 50 MG tablet Take 50 mg by mouth See admin instructions. 11/29/14   [provider]  topiramate (TOPAMAX) 100 MG tablet TAKE 1 TABLET BY MOUTH AT BEDTIME FOR HEADACHE PREVENTION 12/08/14   [provider]  traMADol (ULTRAM) 50 MG tablet Take 1 tablet (50 mg total) by mouth every 6 (six) hours as needed for moderate pain. 07/29/19   Johnn Hai, PA-C  traZODone (DESYREL) 150 MG tablet Take 150 mg by mouth at bedtime. 12/27/14   [provider]    Allergies Ibuprofen  Family History  Problem Relation Age of Onset  . Depression Mother   . Bipolar disorder Mother   . Depression Father   . Bipolar disorder Father   . Depression Sister   . Bipolar disorder Sister     Social History Social History   Tobacco Use  . Smoking status: Current Every Day Smoker  Packs/day: 1.00    Types: Cigarettes  . Smokeless tobacco: Never Used  Substance Use Topics  . Alcohol use: No    Alcohol/week: 0.0 standard drinks  . Drug use: No    Review of Systems  Constitutional: No fever/chills Eyes: No visual changes. ENT: No sore throat. Cardiovascular: Denies chest pain. Respiratory: Denies shortness of breath. Gastrointestinal: No abdominal pain.  No nausea, no vomiting.  No diarrhea.  No constipation. Genitourinary: Negative for dysuria. Musculoskeletal: Negative for back pain. Skin: Negative for rash. Neurological: Negative for headaches, focal weakness or numbness. Psychiatric: Anxiety, bipolar, and  depression.  Allergic/Immunilogical: Ibuprofen  ____________________________________________   PHYSICAL EXAM:  VITAL SIGNS: ED Triage Vitals  Enc Vitals Group     BP 08/02/19 0813 (!) 101/57     Pulse Rate 08/02/19 0813 (!) 122     Resp 08/02/19 0813 18     Temp 08/02/19 0813 (!) 101.1 F (38.4 C)     Temp src --      SpO2 08/02/19 0813 100 %     Weight 08/02/19 0812 145 lb (65.8 kg)     Height 08/02/19 0812 5\' 1"  (1.549 m)     Head Circumference --      Peak Flow --      Pain Score 08/02/19 0812 10     Pain Loc --      Pain Edu? --      Excl. in [place]? --    Constitutional: Alert and oriented. Well appearing and in no acute distress.  Febrile Neck: No cervical spine tenderness to palpation. Hematological/Lymphatic/Immunilogical: No cervical lymphadenopathy. Cardiovascular: Tachycardic, regular rhythm. Grossly normal heart sounds.  Good peripheral circulation. Respiratory: Normal respiratory effort.  No retractions. Lungs CTAB. Gastrointestinal: Soft and nontender. No distention. No abdominal bruits. No CVA tenderness. Genitourinary: Deferred Musculoskeletal: No obvious deformity of the cervical, thoracic, or lumbar spine.  Patient has moderate guarding of the right SI joint.  No lower extremity tenderness nor edema.  No joint effusions. Neurologic:  Normal speech and language. No gross focal neurologic deficits are appreciated.  Right gait instability.  Negative straight leg test. Skin:  Skin is warm, dry and intact. No rash noted. Psychiatric: Mood and affect are normal. Speech and behavior are normal.  ____________________________________________   LABS (all labs ordered are listed, but only abnormal results are displayed)  Labs Reviewed  COMPREHENSIVE METABOLIC PANEL - Abnormal; Notable for the following components:      Result Value   Sodium 128 (*)    Potassium 3.0 (*)    Chloride 89 (*)    Glucose, Bld 133 (*)    Calcium 8.4 (*)    Albumin 2.9 (*)    All other  components within normal limits  CBC WITH DIFFERENTIAL/PLATELET - Abnormal; Notable for the following components:   WBC 14.0 (*)    Hemoglobin 11.8 (*)    HCT 33.7 (*)    Neutro Abs 12.3 (*)    Abs Immature Granulocytes 0.10 (*)    All other components within normal limits  URINALYSIS, COMPLETE (UACMP) WITH MICROSCOPIC - Abnormal; Notable for the following components:   Color, Urine YELLOW (*)    APPearance CLEAR (*)    Bacteria, UA RARE (*)    All other components within normal limits  CK - Abnormal; Notable for the following components:   Total CK 410 (*)    All other components within normal limits  RESPIRATORY PANEL BY RT PCR (FLU A&B, COVID)  CULTURE, BLOOD (ROUTINE  X 2)  CULTURE, BLOOD (ROUTINE X 2)  LACTIC ACID, PLASMA  C-REACTIVE PROTEIN  SEDIMENTATION RATE  POC URINE PREG, ED  POCT PREGNANCY, URINE   ____________________________________________  EKG   ____________________________________________  RADIOLOGY  ED MD interpretation:    Official radiology report(s): DG Lumbar Spine 2-3 Views  Result Date: 08/02/2019 CLINICAL DATA:  Radicular back pain to the right lower extremity, no injury. EXAM: LUMBAR SPINE - 2-3 VIEW COMPARISON:  06/10/2013. FINDINGS: Very mild levoconvex curvature of the lumbar spine may be positional. Alignment is otherwise anatomic. Vertebral body and disc space height are normal. Mild facet sclerosis in the lower lumbar spine. Intrauterine contraceptive device is incidentally noted. IMPRESSION: Mild facet sclerosis at L5-S1. Electronically Signed   By: Lorin Picket M.D.   On: 08/02/2019 09:59   CT Angio Chest PE W and/or Wo Contrast  Result Date: 08/02/2019 CLINICAL DATA:  Shortness of breath, severe back pain radiating down the right leg EXAM: CT ANGIOGRAPHY CHEST CT ABDOMEN AND PELVIS WITH CONTRAST TECHNIQUE: Multidetector CT imaging of the chest was performed using the standard protocol during bolus administration of intravenous contrast.  Multiplanar CT image reconstructions and MIPs were obtained to evaluate the vascular anatomy. Multidetector CT imaging of the abdomen and pelvis was performed using the standard protocol during bolus administration of intravenous contrast. CONTRAST:  57mL OMNIPAQUE IOHEXOL 350 MG/ML SOLN COMPARISON:  None. FINDINGS: CTA CHEST FINDINGS Cardiovascular: Satisfactory opacification of the pulmonary arteries to the segmental level. No evidence of pulmonary embolism. Normal heart size. No pericardial effusion. Mediastinum/Nodes: No enlarged mediastinal, hilar, or axillary lymph nodes. Thyroid gland, trachea, and esophagus demonstrate no significant findings. Lungs/Pleura: Lungs are clear. No pleural effusion or pneumothorax. Musculoskeletal: No chest wall abnormality. No acute or significant osseous findings. Review of the MIP images confirms the above findings. CT ABDOMEN and PELVIS FINDINGS Hepatobiliary: No focal liver abnormality is seen. No gallstones, gallbladder wall thickening, or biliary dilatation. Pancreas: Unremarkable. No pancreatic ductal dilatation or surrounding inflammatory changes. Spleen: Normal in size without focal abnormality. Adrenals/Urinary Tract: Adrenal glands are unremarkable. Kidneys are normal, without renal calculi, focal lesion, or hydronephrosis. Bladder is unremarkable. Stomach/Bowel: Stomach is within normal limits. Appendix appears normal. No evidence of bowel wall thickening, distention, or inflammatory changes. Vascular/Lymphatic: No significant vascular findings are present. No enlarged abdominal or pelvic lymph nodes. Reproductive: No adnexal mass. T-type IUD within the uterus. A tip of 'T' has perforated through the uterine wall and projects into the peroneal cavity. Other: No abdominal wall hernia or abnormality. No abdominopelvic ascites. Musculoskeletal: No acute osseous abnormality. No aggressive osseous lesion. Vertebral body heights are maintained and are in normal anatomic  alignment. No bone destruction or periosteal reaction. Mild osteoarthritis of bilateral SI joints. Heterogeneous enhancement and severe expansion of the right psoas and iliacus muscles with mild adjacent soft tissue edema. Review of the MIP images confirms the above findings. IMPRESSION: 1. Heterogeneous enhancement and severe expansion of the right psoas and iliacus muscles with mild adjacent soft tissue edema. Overall appearance is concerning for infectious myositis versus less likely muscle strain and intramuscular hemorrhage. Subtle small hypodensities within the iliopsoas muscle measuring up to 13 mm concerning for an intramuscular abscess. The lumbar spine demonstrates no focal abnormality to suggest discitis or osteomyelitis, but MRI of the lumbar spine without and with intravenous contrast is recommended for increased sensitivity if there is concern for discitis/osteomyelitis. 2. T-type IUD within the uterus. A tip of 'T' has perforated through the uterine wall and projects into  the peroneal cavity. 3. No acute cardiopulmonary disease. Electronically Signed   By: Kathreen Devoid   On: 08/02/2019 12:09   MR LUMBAR SPINE WO CONTRAST  Result Date: 08/02/2019 CLINICAL DATA:  Low back pain with radiculopathy. No recent injury EXAM: MRI LUMBAR SPINE WITHOUT CONTRAST TECHNIQUE: Multiplanar, multisequence MR imaging of the lumbar spine was performed. No intravenous contrast was administered. COMPARISON:  CT 08/02/2019 FINDINGS: Technical note: Examination is slightly degraded by patient motion artifact. Patient was unable to tolerate any further imaging and no postcontrast sequences were able to be performed. Segmentation:  Standard. Alignment:  Physiologic. Vertebrae:  No fracture, evidence of discitis, or bone lesion. Conus medullaris and cauda equina: Conus extends to the L2 level. Conus and cauda equina appear normal. Paraspinal and other soft tissues: Expansion and extensive fluid and edema signal within the  right psoas muscle as well as the visualized portion of the right iliacus muscle. Fluid collection within the medial aspect of the right iliacus muscle measures up to 2.2 x 1.3 cm in transaxial dimension. The craniocaudal extent of the collection is not entirely included within the field of view. Fluid signal is seen within both the medial iliacus and or distal psoas muscle at the edge of the field of view which appears to extend from the right sacroiliac joint (series 7, image 36). There is patchy bone marrow edema within the right sacrum adjacent to the right SI joint. Visualized portion of the left psoas muscle is normal. The posterior paraspinal musculature is normal. The canal from the L4-5 level and caudally is slightly heterogeneous, which is felt to most likely be secondary to motion artifact and prominence of the epidural fat. No definite epidural fluid collection. Disc levels: T12-L1: No significant disc protrusion, foraminal stenosis, or canal stenosis. L1-L2: No significant disc protrusion, foraminal stenosis, or canal stenosis. L2-L3: No significant disc protrusion, foraminal stenosis, or canal stenosis. L3-L4: No significant disc protrusion, foraminal stenosis, or canal stenosis. L4-L5: No significant disc protrusion, foraminal stenosis, or canal stenosis. L5-S1: No significant disc protrusion, foraminal stenosis, or canal stenosis. IMPRESSION: 1. Limited study due to patient motion artifact. Patient was unable to tolerate any postcontrast imaging. 2. 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 Please see dedicated MRI of the pelvis and right hip which more fully images the musculature for further detail. 3. Negative for osteomyelitis-discitis. 4. Slightly heterogeneous appearance of the distal lumbar canal below the L4-5 level on STIR sequences, which is favored to be secondary to a  combination of prominent epidural fat and motion artifact. Postcontrast imaging would be helpful to exclude the presence of epidural extension of infection. 5. No significant disc disease. No evidence of nerve impingement. Electronically Signed   By: Davina Poke D.O.   On: 08/02/2019 13:40   CT ABDOMEN PELVIS W CONTRAST  Result Date: 08/02/2019 CLINICAL DATA:  Shortness of breath, severe back pain radiating down the right leg EXAM: CT ANGIOGRAPHY CHEST CT ABDOMEN AND PELVIS WITH CONTRAST TECHNIQUE: Multidetector CT imaging of the chest was performed using the standard protocol during bolus administration of intravenous contrast. Multiplanar CT image reconstructions and MIPs were obtained to evaluate the vascular anatomy. Multidetector CT imaging of the abdomen and pelvis was performed using the standard protocol during bolus administration of intravenous contrast. CONTRAST:  61mL OMNIPAQUE IOHEXOL 350 MG/ML SOLN COMPARISON:  None. FINDINGS: CTA CHEST FINDINGS Cardiovascular: Satisfactory opacification of the pulmonary arteries to the segmental level.  No evidence of pulmonary embolism. Normal heart size. No pericardial effusion. Mediastinum/Nodes: No enlarged mediastinal, hilar, or axillary lymph nodes. Thyroid gland, trachea, and esophagus demonstrate no significant findings. Lungs/Pleura: Lungs are clear. No pleural effusion or pneumothorax. Musculoskeletal: No chest wall abnormality. No acute or significant osseous findings. Review of the MIP images confirms the above findings. CT ABDOMEN and PELVIS FINDINGS Hepatobiliary: No focal liver abnormality is seen. No gallstones, gallbladder wall thickening, or biliary dilatation. Pancreas: Unremarkable. No pancreatic ductal dilatation or surrounding inflammatory changes. Spleen: Normal in size without focal abnormality. Adrenals/Urinary Tract: Adrenal glands are unremarkable. Kidneys are normal, without renal calculi, focal lesion, or hydronephrosis. Bladder is  unremarkable. Stomach/Bowel: Stomach is within normal limits. Appendix appears normal. No evidence of bowel wall thickening, distention, or inflammatory changes. Vascular/Lymphatic: No significant vascular findings are present. No enlarged abdominal or pelvic lymph nodes. Reproductive: No adnexal mass. T-type IUD within the uterus. A tip of 'T' has perforated through the uterine wall and projects into the peroneal cavity. Other: No abdominal wall hernia or abnormality. No abdominopelvic ascites. Musculoskeletal: No acute osseous abnormality. No aggressive osseous lesion. Vertebral body heights are maintained and are in normal anatomic alignment. No bone destruction or periosteal reaction. Mild osteoarthritis of bilateral SI joints. Heterogeneous enhancement and severe expansion of the right psoas and iliacus muscles with mild adjacent soft tissue edema. Review of the MIP images confirms the above findings. IMPRESSION: 1. Heterogeneous enhancement and severe expansion of the right psoas and iliacus muscles with mild adjacent soft tissue edema. Overall appearance is concerning for infectious myositis versus less likely muscle strain and intramuscular hemorrhage. Subtle small hypodensities within the iliopsoas muscle measuring up to 13 mm concerning for an intramuscular abscess. The lumbar spine demonstrates no focal abnormality to suggest discitis or osteomyelitis, but MRI of the lumbar spine without and with intravenous contrast is recommended for increased sensitivity if there is concern for discitis/osteomyelitis. 2. T-type IUD within the uterus. A tip of 'T' has perforated through the uterine wall and projects into the peroneal cavity. 3. No acute cardiopulmonary disease. Electronically Signed   By: Kathreen Devoid   On: 08/02/2019 12:09   MR HIP RIGHT WO CONTRAST  Result Date: 08/02/2019 CLINICAL DATA:  Severe back pain radiating down the right hip and leg. Fever. EXAM: MR OF THE RIGHT HIP WITHOUT CONTRAST  TECHNIQUE: Multiplanar, multisequence MR imaging was performed. No intravenous contrast was administered. COMPARISON:  CT scan of the abdomen and pelvis dated 08/02/2019 FINDINGS: Bones: There is abnormal edema in the right side of the sacrum and in the right iliac bone with fluid in the right SI joint, likely representing osteomyelitis and sepsis of the right sacroiliac joint. Articular cartilage and labrum Articular cartilage:  Normal. Labrum:  Normal. Joint or bursal effusion Joint effusion:  Trace joint effusion. Bursae: No bursitis. Muscles and tendons Muscles and tendons: Extensive right iliopsoas abscess extending from at least the L3 level to the insertion of the iliopsoas tendon on the lesser trochanter. Is extensive edema in the iliopsoas muscle consistent with myositis. There is also edema in 1 of the right hip abductor muscles with fluid along the fascia of the muscles in the anterior aspect of the proximal right thigh likely representing infectious fasciitis. There is also extension of abscess into the right gluteal muscles through the a posterior aspect of the right sacroiliac joint seen on images 1 and 2 of series 19. There is edema in the gluteus minimus, medius, and maximus muscles consistent with myositis.  There is edema in the right piriformis muscle consistent with myositis seen on image 8 of series 19. Other findings Miscellaneous:   Distended urinary bladder. IMPRESSION: 1. Extensive right iliopsoas abscess extending from at least the L3-4 level to the insertion of the iliopsoas tendon on the lesser trochanter. 2. 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 3. Myositis of the right gluteus minimus, medius, and maximus and right piriformis muscles. 4. 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 5. Abscess extends through the posterior aspect of the right SI joint into the gluteal musculature. 6. Fluid along the fascial planes in the anterior aspect of the proximal right  thigh likely representing infectious fasciitis. 7. MRI with contrast may better define the extent of the infection and provide a baseline for a serial evaluation. Electronically Signed   By: Lorriane Shire M.D.   On: 08/02/2019 13:45    ____________________________________________   PROCEDURES  Procedure(s) performed (including Critical Care):  Procedures   ____________________________________________   INITIAL IMPRESSION / ASSESSMENT AND PLAN / ED COURSE  As part of my medical decision making, I reviewed the following data within the [facility]     Patient second visit for low back pain and fever.  Patient unable to weight-bear.  Differential consist of spinal abscess, acute back pain,or muscle skeletal pain.  Discussed lab results and lumbar x-ray with patient.  Discussed patient with attending Dr. Jari Pigg.  Further evaluation with CT scan/MRI I is warranted.          ____________________________________________   FINAL CLINICAL IMPRESSION(S) / ED DIAGNOSES  Final diagnoses:  Acute bilateral low back pain with right-sided sciatica  SI (sacroiliac) pain     ED Discharge Orders    None       Note:  This document was prepared using Dragon voice recognition software and may include unintentional dictation errors.    Sable Feil, PA-C 08/02/19 1506    Vanessa Ardoch, MD 08/03/19 [phone]

## 2019-08-02 NOTE — Procedures (Signed)
Pre procedural Dx: Pelvic/RP abscess Post procedural Dx: Same  Technically successful CT guided aspiration of approximately 3 cc of purulent fluid from ill defined abscess within the right iliacus musculature.  All aspirated fluid was capped and sent to the laboratory for analysis.    EBL: Trace Complications: None immediate  Ronny Bacon, MD Pager #: [phone]

## 2019-08-02 NOTE — ED Triage Notes (Signed)
PT to ER via EMS from home with c/o low back pain radiating into legs.  PT was seen last week for same with no improvement.  Pt denies injury at this time.

## 2019-08-02 NOTE — Consult Note (Signed)
Chief Complaint: Retroperitoneal/pelvic abscess  Referring Physician(s): Jari Pigg (ED)  Patient Status: [facility] - ED  History of Present Illness: Leslie Molina is a 34 y.o. female with past medical history significant for bipolar 1 disorder, previous intravenous drug abuse and anxiety who presented to the [facility] emergency department with significant right hip pain as well as fever and tachycardia with subsequent cross-sectional imaging demonstrating development of a very ill-defined retroperitoneal abscess involving the right iliopsoas and iliac [place] musculature.  As such, request made for image guided aspiration and/or drainage catheter placement for diagnostic purposes to ensure initiation of appropriate antibiotic.  Patient continues to complain of back and hip pain as well as fevers and chills.  She currently denies chest pain or shortness of breath.  Past Medical History:  Diagnosis Date  . Anxiety   . Bipolar 1 disorder ([place])   . Depression   . Migraine   . Nerve damage     Past Surgical History:  Procedure Laterality Date  . CESAREAN SECTION      Allergies: Ibuprofen  Medications: Prior to Admission medications   Medication Sig Start Date End Date Taking? Authorizing Provider  ALPRAZolam Duanne Moron) 0.5 MG tablet Take 0.5 mg by mouth 3 (three) times daily as needed. 12/26/14   [provider]  ALPRAZolam Duanne Moron) 1 MG tablet Take 1 mg by mouth 3 (three) times daily. 10/24/14   [provider]  gabapentin (NEURONTIN) 300 MG capsule Take 1 capsule (300 mg total) by mouth 3 (three) times daily. 07/29/19   Johnn Hai, PA-C  hydrOXYzine (VISTARIL) 50 MG capsule Take 100 mg by mouth at bedtime as needed for anxiety (sleep).  12/08/14   [provider]  LATUDA 20 MG TABS Take 20 mg by mouth daily.  12/26/14   [provider]  QVAR 80 MCG/ACT inhaler INHALE 2 PUFFS EVERY 4 TO 6 HOURS AS NEEDED 12/08/14   [provider]  SEROQUEL XR 150 MG 24  hr tablet Take 150 mg by mouth every evening. 12/27/14   [provider]  simvastatin (ZOCOR) 40 MG tablet Take 40 mg by mouth daily. 12/08/14   [provider]  SPIRIVA HANDIHALER 18 MCG inhalation capsule INHALE 1 CAPSULE VIA HANDIHALER ONCE DAILY AT THE SAME TIME EVERY DAY 12/08/14   [provider]  SUMAtriptan (IMITREX) 50 MG tablet Take 50 mg by mouth See admin instructions. 11/29/14   [provider]  topiramate (TOPAMAX) 100 MG tablet Take 100 mg by mouth at bedtime.  12/08/14   [provider]  traMADol (ULTRAM) 50 MG tablet Take 1 tablet (50 mg total) by mouth every 6 (six) hours as needed for moderate pain. 07/29/19   Johnn Hai, PA-C  traZODone (DESYREL) 150 MG tablet Take 150 mg by mouth at bedtime. 12/27/14   [provider]     Family History  Problem Relation Age of Onset  . Depression Mother   . Bipolar disorder Mother   . Depression Father   . Bipolar disorder Father   . Depression Sister   . Bipolar disorder Sister     Social History   Socioeconomic History  . Marital status: Single    Spouse name: Not on file  . Number of children: Not on file  . Years of education: Not on file  . Highest education level: Not on file  Occupational History  . Not on file  Tobacco Use  . Smoking status: Current Every Day Smoker    Packs/day:  1.00    Types: Cigarettes  . Smokeless tobacco: Never Used  Substance and Sexual Activity  . Alcohol use: No    Alcohol/week: 0.0 standard drinks  . Drug use: No  . Sexual activity: Not on file  Other Topics Concern  . Not on file  Social History Narrative  . Not on file   Social Determinants of Health   Financial Resource Strain:   . Difficulty of Paying Living Expenses: Not on file  Food Insecurity:   . Worried About Charity fundraiser in the Last Year: Not on file  . Ran Out of Food in the Last Year: Not on file  Transportation Needs:   . Lack of Transportation (Medical):  Not on file  . Lack of Transportation (Non-Medical): Not on file  Physical Activity:   . Days of Exercise per Week: Not on file  . Minutes of Exercise per Session: Not on file  Stress:   . Feeling of Stress : Not on file  Social Connections:   . Frequency of Communication with Friends and Family: Not on file  . Frequency of Social Gatherings with Friends and Family: Not on file  . Attends Religious Services: Not on file  . Active Member of Clubs or Organizations: Not on file  . Attends Archivist Meetings: Not on file  . Marital Status: Not on file    ECOG Status: 2 - Symptomatic, <50% confined to bed  Review of Systems: A 12 point ROS discussed and pertinent positives are indicated in the HPI above.  All other systems are negative.  Review of Systems  Vital Signs: BP (!) 125/94   Pulse (!) 134   Temp (!) 103.3 F (39.6 C) (Oral)   Resp (!) 49   Ht 5\' 1"  (1.549 m)   Wt 65.8 kg   SpO2 97%   BMI 27.40 kg/m   Physical Exam  Imaging: DG Lumbar Spine 2-3 Views  Result Date: 08/02/2019 CLINICAL DATA:  Radicular back pain to the right lower extremity, no injury. EXAM: LUMBAR SPINE - 2-3 VIEW COMPARISON:  06/10/2013. FINDINGS: Very mild levoconvex curvature of the lumbar spine may be positional. Alignment is otherwise anatomic. Vertebral body and disc space height are normal. Mild facet sclerosis in the lower lumbar spine. Intrauterine contraceptive device is incidentally noted. IMPRESSION: Mild facet sclerosis at L5-S1. Electronically Signed   By: Lorin Picket M.D.   On: 08/02/2019 09:59   CT Angio Chest PE W and/or Wo Contrast  Result Date: 08/02/2019 CLINICAL DATA:  Shortness of breath, severe back pain radiating down the right leg EXAM: CT ANGIOGRAPHY CHEST CT ABDOMEN AND PELVIS WITH CONTRAST TECHNIQUE: Multidetector CT imaging of the chest was performed using the standard protocol during bolus administration of intravenous contrast. Multiplanar CT image  reconstructions and MIPs were obtained to evaluate the vascular anatomy. Multidetector CT imaging of the abdomen and pelvis was performed using the standard protocol during bolus administration of intravenous contrast. CONTRAST:  14mL OMNIPAQUE IOHEXOL 350 MG/ML SOLN COMPARISON:  None. FINDINGS: CTA CHEST FINDINGS Cardiovascular: Satisfactory opacification of the pulmonary arteries to the segmental level. No evidence of pulmonary embolism. Normal heart size. No pericardial effusion. Mediastinum/Nodes: No enlarged mediastinal, hilar, or axillary lymph nodes. Thyroid gland, trachea, and esophagus demonstrate no significant findings. Lungs/Pleura: Lungs are clear. No pleural effusion or pneumothorax. Musculoskeletal: No chest wall abnormality. No acute or significant osseous findings. Review of the MIP images confirms the above findings. CT ABDOMEN and PELVIS FINDINGS Hepatobiliary: No  focal liver abnormality is seen. No gallstones, gallbladder wall thickening, or biliary dilatation. Pancreas: Unremarkable. No pancreatic ductal dilatation or surrounding inflammatory changes. Spleen: Normal in size without focal abnormality. Adrenals/Urinary Tract: Adrenal glands are unremarkable. Kidneys are normal, without renal calculi, focal lesion, or hydronephrosis. Bladder is unremarkable. Stomach/Bowel: Stomach is within normal limits. Appendix appears normal. No evidence of bowel wall thickening, distention, or inflammatory changes. Vascular/Lymphatic: No significant vascular findings are present. No enlarged abdominal or pelvic lymph nodes. Reproductive: No adnexal mass. T-type IUD within the uterus. A tip of 'T' has perforated through the uterine wall and projects into the peroneal cavity. Other: No abdominal wall hernia or abnormality. No abdominopelvic ascites. Musculoskeletal: No acute osseous abnormality. No aggressive osseous lesion. Vertebral body heights are maintained and are in normal anatomic alignment. No bone  destruction or periosteal reaction. Mild osteoarthritis of bilateral SI joints. Heterogeneous enhancement and severe expansion of the right psoas and iliacus muscles with mild adjacent soft tissue edema. Review of the MIP images confirms the above findings. IMPRESSION: 1. Heterogeneous enhancement and severe expansion of the right psoas and iliacus muscles with mild adjacent soft tissue edema. Overall appearance is concerning for infectious myositis versus less likely muscle strain and intramuscular hemorrhage. Subtle small hypodensities within the iliopsoas muscle measuring up to 13 mm concerning for an intramuscular abscess. The lumbar spine demonstrates no focal abnormality to suggest discitis or osteomyelitis, but MRI of the lumbar spine without and with intravenous contrast is recommended for increased sensitivity if there is concern for discitis/osteomyelitis. 2. T-type IUD within the uterus. A tip of 'T' has perforated through the uterine wall and projects into the peroneal cavity. 3. No acute cardiopulmonary disease. Electronically Signed   By: Kathreen Devoid   On: 08/02/2019 12:09   MR LUMBAR SPINE WO CONTRAST  Result Date: 08/02/2019 CLINICAL DATA:  Low back pain with radiculopathy. No recent injury EXAM: MRI LUMBAR SPINE WITHOUT CONTRAST TECHNIQUE: Multiplanar, multisequence MR imaging of the lumbar spine was performed. No intravenous contrast was administered. COMPARISON:  CT 08/02/2019 FINDINGS: Technical note: Examination is slightly degraded by patient motion artifact. Patient was unable to tolerate any further imaging and no postcontrast sequences were able to be performed. Segmentation:  Standard. Alignment:  Physiologic. Vertebrae:  No fracture, evidence of discitis, or bone lesion. Conus medullaris and cauda equina: Conus extends to the L2 level. Conus and cauda equina appear normal. Paraspinal and other soft tissues: Expansion and extensive fluid and edema signal within the right psoas muscle as  well as the visualized portion of the right iliacus muscle. Fluid collection within the medial aspect of the right iliacus muscle measures up to 2.2 x 1.3 cm in transaxial dimension. The craniocaudal extent of the collection is not entirely included within the field of view. Fluid signal is seen within both the medial iliacus and or distal psoas muscle at the edge of the field of view which appears to extend from the right sacroiliac joint (series 7, image 36). There is patchy bone marrow edema within the right sacrum adjacent to the right SI joint. Visualized portion of the left psoas muscle is normal. The posterior paraspinal musculature is normal. The canal from the L4-5 level and caudally is slightly heterogeneous, which is felt to most likely be secondary to motion artifact and prominence of the epidural fat. No definite epidural fluid collection. Disc levels: T12-L1: No significant disc protrusion, foraminal stenosis, or canal stenosis. L1-L2: No significant disc protrusion, foraminal stenosis, or canal stenosis. L2-L3: No  significant disc protrusion, foraminal stenosis, or canal stenosis. L3-L4: No significant disc protrusion, foraminal stenosis, or canal stenosis. L4-L5: No significant disc protrusion, foraminal stenosis, or canal stenosis. L5-S1: No significant disc protrusion, foraminal stenosis, or canal stenosis. IMPRESSION: 1. Limited study due to patient motion artifact. Patient was unable to tolerate any postcontrast imaging. 2. 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 Please see dedicated MRI of the pelvis and right hip which more fully images the musculature for further detail. 3. Negative for osteomyelitis-discitis. 4. Slightly heterogeneous appearance of the distal lumbar canal below the L4-5 level on STIR sequences, which is favored to be secondary to a combination of  prominent epidural fat and motion artifact. Postcontrast imaging would be helpful to exclude the presence of epidural extension of infection. 5. No significant disc disease. No evidence of nerve impingement. Electronically Signed   By: Davina Poke D.O.   On: 08/02/2019 13:40   CT ABDOMEN PELVIS W CONTRAST  Result Date: 08/02/2019 CLINICAL DATA:  Shortness of breath, severe back pain radiating down the right leg EXAM: CT ANGIOGRAPHY CHEST CT ABDOMEN AND PELVIS WITH CONTRAST TECHNIQUE: Multidetector CT imaging of the chest was performed using the standard protocol during bolus administration of intravenous contrast. Multiplanar CT image reconstructions and MIPs were obtained to evaluate the vascular anatomy. Multidetector CT imaging of the abdomen and pelvis was performed using the standard protocol during bolus administration of intravenous contrast. CONTRAST:  51mL OMNIPAQUE IOHEXOL 350 MG/ML SOLN COMPARISON:  None. FINDINGS: CTA CHEST FINDINGS Cardiovascular: Satisfactory opacification of the pulmonary arteries to the segmental level. No evidence of pulmonary embolism. Normal heart size. No pericardial effusion. Mediastinum/Nodes: No enlarged mediastinal, hilar, or axillary lymph nodes. Thyroid gland, trachea, and esophagus demonstrate no significant findings. Lungs/Pleura: Lungs are clear. No pleural effusion or pneumothorax. Musculoskeletal: No chest wall abnormality. No acute or significant osseous findings. Review of the MIP images confirms the above findings. CT ABDOMEN and PELVIS FINDINGS Hepatobiliary: No focal liver abnormality is seen. No gallstones, gallbladder wall thickening, or biliary dilatation. Pancreas: Unremarkable. No pancreatic ductal dilatation or surrounding inflammatory changes. Spleen: Normal in size without focal abnormality. Adrenals/Urinary Tract: Adrenal glands are unremarkable. Kidneys are normal, without renal calculi, focal lesion, or hydronephrosis. Bladder is unremarkable.  Stomach/Bowel: Stomach is within normal limits. Appendix appears normal. No evidence of bowel wall thickening, distention, or inflammatory changes. Vascular/Lymphatic: No significant vascular findings are present. No enlarged abdominal or pelvic lymph nodes. Reproductive: No adnexal mass. T-type IUD within the uterus. A tip of 'T' has perforated through the uterine wall and projects into the peroneal cavity. Other: No abdominal wall hernia or abnormality. No abdominopelvic ascites. Musculoskeletal: No acute osseous abnormality. No aggressive osseous lesion. Vertebral body heights are maintained and are in normal anatomic alignment. No bone destruction or periosteal reaction. Mild osteoarthritis of bilateral SI joints. Heterogeneous enhancement and severe expansion of the right psoas and iliacus muscles with mild adjacent soft tissue edema. Review of the MIP images confirms the above findings. IMPRESSION: 1. Heterogeneous enhancement and severe expansion of the right psoas and iliacus muscles with mild adjacent soft tissue edema. Overall appearance is concerning for infectious myositis versus less likely muscle strain and intramuscular hemorrhage. Subtle small hypodensities within the iliopsoas muscle measuring up to 13 mm concerning for an intramuscular abscess. The lumbar spine demonstrates no focal abnormality to suggest discitis or osteomyelitis, but MRI of the lumbar spine without and with intravenous contrast is  recommended for increased sensitivity if there is concern for discitis/osteomyelitis. 2. T-type IUD within the uterus. A tip of 'T' has perforated through the uterine wall and projects into the peroneal cavity. 3. No acute cardiopulmonary disease. Electronically Signed   By: Kathreen Devoid   On: 08/02/2019 12:09   MR HIP RIGHT WO CONTRAST  Result Date: 08/02/2019 CLINICAL DATA:  Severe back pain radiating down the right hip and leg. Fever. EXAM: MR OF THE RIGHT HIP WITHOUT CONTRAST TECHNIQUE:  Multiplanar, multisequence MR imaging was performed. No intravenous contrast was administered. COMPARISON:  CT scan of the abdomen and pelvis dated 08/02/2019 FINDINGS: Bones: There is abnormal edema in the right side of the sacrum and in the right iliac bone with fluid in the right SI joint, likely representing osteomyelitis and sepsis of the right sacroiliac joint. Articular cartilage and labrum Articular cartilage:  Normal. Labrum:  Normal. Joint or bursal effusion Joint effusion:  Trace joint effusion. Bursae: No bursitis. Muscles and tendons Muscles and tendons: Extensive right iliopsoas abscess extending from at least the L3 level to the insertion of the iliopsoas tendon on the lesser trochanter. Is extensive edema in the iliopsoas muscle consistent with myositis. There is also edema in 1 of the right hip abductor muscles with fluid along the fascia of the muscles in the anterior aspect of the proximal right thigh likely representing infectious fasciitis. There is also extension of abscess into the right gluteal muscles through the a posterior aspect of the right sacroiliac joint seen on images 1 and 2 of series 19. There is edema in the gluteus minimus, medius, and maximus muscles consistent with myositis. There is edema in the right piriformis muscle consistent with myositis seen on image 8 of series 19. Other findings Miscellaneous:   Distended urinary bladder. IMPRESSION: 1. Extensive right iliopsoas abscess extending from at least the L3-4 level to the insertion of the iliopsoas tendon on the lesser trochanter. 2. 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 3. Myositis of the right gluteus minimus, medius, and maximus and right piriformis muscles. 4. 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 5. Abscess extends through the posterior aspect of the right SI joint into the gluteal musculature. 6. Fluid along the fascial planes in the anterior aspect of the proximal right thigh likely  representing infectious fasciitis. 7. MRI with contrast may better define the extent of the infection and provide a baseline for a serial evaluation. Electronically Signed   By: Lorriane Shire M.D.   On: 08/02/2019 13:45    Labs:  CBC: Recent Labs    08/02/19 0843  WBC 14.0*  HGB 11.8*  HCT 33.7*  PLT 304    COAGS: No results for input(s): INR, APTT in the last 8760 hours.  BMP: Recent Labs    08/02/19 0843  NA 128*  K 3.0*  CL 89*  CO2 24  GLUCOSE 133*  BUN 11  CALCIUM 8.4*  CREATININE 0.60  GFRNONAA >60  GFRAA >60    LIVER FUNCTION TESTS: Recent Labs    08/02/19 0843  BILITOT 0.5  AST 30  ALT 24  ALKPHOS 103  PROT 7.2  ALBUMIN 2.9*    TUMOR MARKERS: No results for input(s): AFPTM, CEA, CA199, CHROMGRNA in the last 8760 hours.  Assessment and Plan:  Brighid Vondracek is a 34 y.o. female with past medical history significant for bipolar 1 disorder, previous intravenous drug abuse and anxiety who presented to the [facility] emergency department with significant  right hip pain as well as fever and tachycardia with subsequent cross-sectional imaging demonstrating development of a very ill-defined retroperitoneal abscess involving the right iliopsoas and iliac [place] musculature.  As such, request made for image guided aspiration and/or drainage catheter placement for diagnostic purposes to ensure initiation of appropriate antibiotic.  Risks and benefits CT-guided pelvic/retroperitoneal aspiration intravenous cath placement was discussed with the patient including bleeding, infection, damage to adjacent structures, bowel perforation/fistula connection, and sepsis.  All of the patient's questions were answered, patient is agreeable to proceed. Consent signed and in chart.    Thank you for this interesting consult.  I greatly enjoyed meeting Leslie Molina and look forward to participating in their care.  A copy of this report was sent to the requesting provider on this  date.  Electronically Signed: Sandi Mariscal, MD 08/02/2019, 5:22 PM   I spent a total of 20 Minutes in face to face in clinical consultation, greater than 50% of which was counseling/coordinating care for CT-guided aspiration and/or drainage catheter placed

## 2019-08-02 NOTE — ED Notes (Signed)
This RN to pt room to answer call bell. Pt c/o SHOB that started while getting MRI a few minutes ago, states she is claustrophobic.  Pt SpO2 95% on RA. Breathing non labored, pt appears anxious, states hx of anxiety. Will make EDP aware.

## 2019-08-02 NOTE — Sedation Documentation (Signed)
Post abscess drain, versed 3 mg iv and fentanyl 150 mcg. Sample sent to lab. reprot called to [facility] ED RN. Plan for SPR nurse to recover in ED 30 minutes before transfer care back to ED staff since no admission orders yet.

## 2019-08-02 NOTE — Progress Notes (Signed)
Pharmacy Antibiotic Note  Leslie Molina is a 34 y.o. female admitted on 08/02/2019 with sepsis.  Pharmacy has been consulted for Vancomycin ,Cefepime  dosing.  Plan: Cefepime 2 gm IV X 1 given in ED on 3/3 @ 1400. Cefepime 2 gm IV Q8H ordered to continue on 3/3 @ 2200.   Vancomycin 1 gm IV X 1 given in ED on 3/3 @ 1600. Vancomycin 500 mg IV X 1 ordered to make total loading dose of 1500 mg.  Vancomycin 750 mg IV Q12H ordered to continue on 3/4 @ 0400.  No peak or trough currently ordered.   Vd = 47.4 L  Ke = 0.066 hr-1 T1/2 = 10.4 hrs AUC = 476.4  Vanc trough = 13.9     Height: 5\' 1"  (154.9 cm) Weight: 145 lb (65.8 kg) IBW/kg (Calculated) : 47.8  Temp (24hrs), Avg:102.1 F (38.9 C), Min:100.2 F (37.9 C), Max:103.3 F (39.6 C)  Recent Labs  Lab 08/02/19 0843 08/02/19 1130  WBC 14.0*  --   CREATININE 0.60  --   LATICACIDVEN  --  1.4    Estimated Creatinine Clearance: 86 mL/min (by C-G formula based on SCr of 0.6 mg/dL).    No Known Allergies  Antimicrobials this admission:   >>    >>   Dose adjustments this admission:   Microbiology results:  BCx:   UCx:    Sputum:    MRSA PCR:   Thank you for allowing pharmacy to be a part of this patient's care.  Leslie Molina D 08/02/2019 8:54 PM

## 2019-08-02 NOTE — Progress Notes (Signed)
ID brief note  Reviewed imaging and labs as well as history.  Rec cont vanco, cefepime and flagyl. Cx are pending.  Will need echo Will see tomorrow.

## 2019-08-02 NOTE — ED Notes (Signed)
Pt oral temp 103. Dr. Kerman Passey made aware. 800mg  ibuprofen verbal order

## 2019-08-02 NOTE — ED Notes (Addendum)
This RN heard someone calling for help, went to pt's room where pt states "can I just leave? I havent had anything to drink in over 4 hours and my mouth is so dry, if I cant get something to drink I am leaving". Dr. Jari Pigg informed and states it is ok for pt to have ice chips, ice chips given to pt

## 2019-08-02 NOTE — ED Provider Notes (Signed)
1:09 PM Assumed care for off going team.   Blood pressure (!) 114/55, pulse (!) 128, temperature (!) 102.6 F (39.2 C), resp. rate 18, height 5\' 1"  (1.549 m), weight 65.8 kg, SpO2 100 %.  Patient came in with significant right hip pain.  Patient does have a history of IV drug use.  Patient does have a little bit of tenderness up in her chest as well.  We will proceed with CT chest abdomen pelvis to look for possible source for her significant fever and tachycardia.  CT scans as below  IMPRESSION: 1. Heterogeneous enhancement and severe expansion of the right psoas and iliacus muscles with mild adjacent soft tissue edema. Overall appearance is concerning for infectious myositis versus less likely muscle strain and intramuscular hemorrhage. Subtle small hypodensities within the iliopsoas muscle measuring up to 13 mm concerning for an intramuscular abscess. The lumbar spine demonstrates no focal abnormality to suggest discitis or osteomyelitis, but MRI of the lumbar spine without and with intravenous contrast is recommended for increased sensitivity if there is concern for discitis/osteomyelitis. 2. T-type IUD within the uterus. A tip of 'T' has perforated through the uterine wall and projects into the peroneal cavity. 3. No acute cardiopulmonary disease.  For the perforated IUD I do not think this is the cause of her fevers and she has no abdominal tenderness or signs of peritonitis.  I did discuss with Dr. Amalia Hailey who stated that they were aware and would like Dr. Marcelline Mates her primary OB/GYN know but agree that it is unlikely the cause of her symptoms today.  There was a chance that they could just remove the IUD if her strings were still visible.  Patient was already ordered the MRI.  It was ordered with contrast but it sounds like patient was brought back without the contrast being done due to unable to tolerate.  For some reason the images have not crossed over and I cannot see the read.   We called MRI no read was read to me.  MRI was concerning for right iliopsoas abscess with myositis and concern for sacroiliitis.  They recommended a MRI with contrast to rule out any epidural extension of this.  Unfortunately will have to find out if patient was already given contrast or if we can do another contrast MRI.  Will consult Ortho.  Dr. Harlow Mares recommends talking with the surgical team.  They do not feel comfortable to let him know  D/w IR watts -Areas are ill defined in nature.  They are going to try and get aspiration.   At this time I do not think repeat MRI will be beneficial given she is still in significant pain and with her fevers and shaking she may need to some time before she can go back for the MRI with contrast of her lumbar spine to make sure no epidural abscess Discussed with MRI they will try to get her later.  Discussed with Dr. Francine Graven I also messaged Dr. Dahlia Byes surgery team so they can message each other.  Dr. Dahlia Byes did not think this was his area of expertise and ask we engage ortho..  I  message Dr. Harlow Mares to let them be aware.  On reevaluation of patient she is looking more comfortable with some pain medication fluids and antibiotics.  Patient will require admission for sepsis and further work-up of these abscesses.  Labs have been reviewed and she is got no lactate although does have significant elevated CRP.   PROCEDURES  Procedure(s) performed (  including Critical Care):  .Critical Care Performed by: Vanessa Towns, MD Authorized by: Vanessa Pomona, MD   Critical care provider statement:    Critical care time (minutes):  45   Critical care was necessary to treat or prevent imminent or life-threatening deterioration of the following conditions:  Sepsis   Critical care was time spent personally by me on the following activities:  Discussions with consultants, evaluation of patient's response to treatment, examination of patient, ordering and performing  treatments and interventions, ordering and review of laboratory studies, ordering and review of radiographic studies, pulse oximetry, re-evaluation of patient's condition, obtaining history from patient or surrogate and review of old charts          Vanessa [place], MD 08/02/19 1600

## 2019-08-02 NOTE — ED Notes (Addendum)
2nd set of cultures taken from R upper arm. Pt continues to shake and c/o back/leg pain. Provider Tamala Julian notified verbally. No new orders. Pt leaving for imaging.

## 2019-08-02 NOTE — ED Notes (Signed)
Dr. Kerman Passey notified of temp. No new orders at the moment

## 2019-08-02 NOTE — ED Notes (Signed)
Verbal order for 1g tylenol for fever Dr. Kerman Passey

## 2019-08-02 NOTE — ED Notes (Signed)
Pt to specials at this time to have abscess drained

## 2019-08-02 NOTE — ED Notes (Signed)
Pt arrived back to treatment room from specials

## 2019-08-02 NOTE — ED Notes (Signed)
Pt c/o 10/10 back pain, prn pain medication given as requested

## 2019-08-03 ENCOUNTER — Inpatient Hospital Stay
Admit: 2019-08-03 | Discharge: 2019-08-03 | Disposition: A | Discharge status: Home / Self Care | Payer: Medicaid Other | Attending: Nurse Practitioner | Admitting: Nurse Practitioner

## 2019-08-03 ENCOUNTER — Encounter: Payer: Self-pay | Admitting: Obstetrics & Gynecology

## 2019-08-03 ENCOUNTER — Inpatient Hospital Stay: Payer: Medicaid Other

## 2019-08-03 ENCOUNTER — Inpatient Hospital Stay (HOSPITAL_COMMUNITY)
Admission: AD | Admit: 2019-08-03 | Discharge: 2019-09-03 | DRG: 871 | Discharge status: Against Medical Advice | Payer: Medicaid Other | Source: Other Acute Inpatient Hospital | Attending: Student | Admitting: Student

## 2019-08-03 ENCOUNTER — Inpatient Hospital Stay (HOSPITAL_COMMUNITY): Payer: Medicaid Other

## 2019-08-03 DIAGNOSIS — J189 Pneumonia, unspecified organism: Secondary | ICD-10-CM | POA: Diagnosis present

## 2019-08-03 DIAGNOSIS — Z20822 Contact with and (suspected) exposure to covid-19: Secondary | ICD-10-CM | POA: Diagnosis not present

## 2019-08-03 DIAGNOSIS — K6812 Psoas muscle abscess: Secondary | ICD-10-CM | POA: Diagnosis present

## 2019-08-03 DIAGNOSIS — R6521 Severe sepsis with septic shock: Secondary | ICD-10-CM | POA: Diagnosis present

## 2019-08-03 DIAGNOSIS — M4628 Osteomyelitis of vertebra, sacral and sacrococcygeal region: Secondary | ICD-10-CM | POA: Diagnosis present

## 2019-08-03 DIAGNOSIS — E876 Hypokalemia: Secondary | ICD-10-CM | POA: Diagnosis present

## 2019-08-03 DIAGNOSIS — T8339XA Other mechanical complication of intrauterine contraceptive device, initial encounter: Secondary | ICD-10-CM | POA: Diagnosis present

## 2019-08-03 DIAGNOSIS — M0008 Staphylococcal arthritis, vertebrae: Secondary | ICD-10-CM | POA: Diagnosis not present

## 2019-08-03 DIAGNOSIS — E440 Moderate protein-calorie malnutrition: Secondary | ICD-10-CM | POA: Diagnosis not present

## 2019-08-03 DIAGNOSIS — K429 Umbilical hernia without obstruction or gangrene: Secondary | ICD-10-CM | POA: Diagnosis present

## 2019-08-03 DIAGNOSIS — R7881 Bacteremia: Secondary | ICD-10-CM | POA: Diagnosis present

## 2019-08-03 DIAGNOSIS — L0291 Cutaneous abscess, unspecified: Secondary | ICD-10-CM | POA: Diagnosis not present

## 2019-08-03 DIAGNOSIS — A4101 Sepsis due to Methicillin susceptible Staphylococcus aureus: Secondary | ICD-10-CM | POA: Diagnosis not present

## 2019-08-03 DIAGNOSIS — B9562 Methicillin resistant Staphylococcus aureus infection as the cause of diseases classified elsewhere: Secondary | ICD-10-CM

## 2019-08-03 DIAGNOSIS — J15212 Pneumonia due to Methicillin resistant Staphylococcus aureus: Secondary | ICD-10-CM | POA: Diagnosis not present

## 2019-08-03 DIAGNOSIS — M7989 Other specified soft tissue disorders: Secondary | ICD-10-CM | POA: Diagnosis present

## 2019-08-03 DIAGNOSIS — I2690 Septic pulmonary embolism without acute cor pulmonale: Secondary | ICD-10-CM | POA: Diagnosis not present

## 2019-08-03 DIAGNOSIS — M609 Myositis, unspecified: Secondary | ICD-10-CM

## 2019-08-03 DIAGNOSIS — Y762 Prosthetic and other implants, materials and accessory obstetric and gynecological devices associated with adverse incidents: Secondary | ICD-10-CM | POA: Diagnosis not present

## 2019-08-03 DIAGNOSIS — F419 Anxiety disorder, unspecified: Secondary | ICD-10-CM | POA: Diagnosis not present

## 2019-08-03 DIAGNOSIS — L02511 Cutaneous abscess of right hand: Secondary | ICD-10-CM | POA: Diagnosis not present

## 2019-08-03 DIAGNOSIS — G4700 Insomnia, unspecified: Secondary | ICD-10-CM | POA: Diagnosis present

## 2019-08-03 DIAGNOSIS — M898X1 Other specified disorders of bone, shoulder: Secondary | ICD-10-CM | POA: Diagnosis not present

## 2019-08-03 DIAGNOSIS — F1110 Opioid abuse, uncomplicated: Secondary | ICD-10-CM | POA: Diagnosis present

## 2019-08-03 DIAGNOSIS — R300 Dysuria: Secondary | ICD-10-CM | POA: Diagnosis not present

## 2019-08-03 DIAGNOSIS — Z9889 Other specified postprocedural states: Secondary | ICD-10-CM

## 2019-08-03 DIAGNOSIS — D509 Iron deficiency anemia, unspecified: Secondary | ICD-10-CM | POA: Diagnosis not present

## 2019-08-03 DIAGNOSIS — L409 Psoriasis, unspecified: Secondary | ICD-10-CM | POA: Diagnosis present

## 2019-08-03 DIAGNOSIS — R079 Chest pain, unspecified: Secondary | ICD-10-CM | POA: Diagnosis not present

## 2019-08-03 DIAGNOSIS — A4102 Sepsis due to Methicillin resistant Staphylococcus aureus: Principal | ICD-10-CM | POA: Diagnosis present

## 2019-08-03 DIAGNOSIS — M79641 Pain in right hand: Secondary | ICD-10-CM | POA: Diagnosis not present

## 2019-08-03 DIAGNOSIS — F17210 Nicotine dependence, cigarettes, uncomplicated: Secondary | ICD-10-CM | POA: Diagnosis present

## 2019-08-03 DIAGNOSIS — R509 Fever, unspecified: Secondary | ICD-10-CM | POA: Diagnosis not present

## 2019-08-03 DIAGNOSIS — Z978 Presence of other specified devices: Secondary | ICD-10-CM | POA: Diagnosis not present

## 2019-08-03 DIAGNOSIS — M6008 Infective myositis, other site: Secondary | ICD-10-CM | POA: Diagnosis not present

## 2019-08-03 DIAGNOSIS — F1190 Opioid use, unspecified, uncomplicated: Secondary | ICD-10-CM | POA: Diagnosis not present

## 2019-08-03 DIAGNOSIS — M6009 Infective myositis, multiple sites: Secondary | ICD-10-CM | POA: Diagnosis present

## 2019-08-03 DIAGNOSIS — A419 Sepsis, unspecified organism: Secondary | ICD-10-CM | POA: Diagnosis not present

## 2019-08-03 DIAGNOSIS — K5900 Constipation, unspecified: Secondary | ICD-10-CM | POA: Diagnosis not present

## 2019-08-03 DIAGNOSIS — Z95828 Presence of other vascular implants and grafts: Secondary | ICD-10-CM | POA: Diagnosis not present

## 2019-08-03 DIAGNOSIS — K659 Peritonitis, unspecified: Secondary | ICD-10-CM | POA: Diagnosis not present

## 2019-08-03 DIAGNOSIS — J869 Pyothorax without fistula: Secondary | ICD-10-CM | POA: Diagnosis not present

## 2019-08-03 DIAGNOSIS — M25451 Effusion, right hip: Secondary | ICD-10-CM | POA: Diagnosis not present

## 2019-08-03 DIAGNOSIS — M0009 Staphylococcal polyarthritis: Secondary | ICD-10-CM | POA: Diagnosis not present

## 2019-08-03 DIAGNOSIS — D649 Anemia, unspecified: Secondary | ICD-10-CM | POA: Diagnosis not present

## 2019-08-03 DIAGNOSIS — Z5329 Procedure and treatment not carried out because of patient's decision for other reasons: Secondary | ICD-10-CM | POA: Diagnosis not present

## 2019-08-03 DIAGNOSIS — L0231 Cutaneous abscess of buttock: Secondary | ICD-10-CM | POA: Diagnosis not present

## 2019-08-03 DIAGNOSIS — T8332XA Displacement of intrauterine contraceptive device, initial encounter: Secondary | ICD-10-CM | POA: Insufficient documentation

## 2019-08-03 DIAGNOSIS — D473 Essential (hemorrhagic) thrombocythemia: Secondary | ICD-10-CM | POA: Diagnosis not present

## 2019-08-03 DIAGNOSIS — F1990 Other psychoactive substance use, unspecified, uncomplicated: Secondary | ICD-10-CM | POA: Diagnosis not present

## 2019-08-03 DIAGNOSIS — Z765 Malingerer [conscious simulation]: Secondary | ICD-10-CM

## 2019-08-03 DIAGNOSIS — Z98891 History of uterine scar from previous surgery: Secondary | ICD-10-CM

## 2019-08-03 DIAGNOSIS — F319 Bipolar disorder, unspecified: Secondary | ICD-10-CM | POA: Diagnosis not present

## 2019-08-03 DIAGNOSIS — F1910 Other psychoactive substance abuse, uncomplicated: Secondary | ICD-10-CM | POA: Diagnosis present

## 2019-08-03 DIAGNOSIS — Z818 Family history of other mental and behavioral disorders: Secondary | ICD-10-CM

## 2019-08-03 DIAGNOSIS — L02413 Cutaneous abscess of right upper limb: Secondary | ICD-10-CM | POA: Diagnosis present

## 2019-08-03 DIAGNOSIS — M25551 Pain in right hip: Secondary | ICD-10-CM | POA: Diagnosis not present

## 2019-08-03 LAB — BLOOD CULTURE ID PANEL (REFLEXED)

## 2019-08-03 LAB — CBC WITH DIFFERENTIAL/PLATELET
Abs Immature Granulocytes: 0 10*3/uL (ref 0.00–0.07)
Basophils Absolute: 0 10*3/uL (ref 0.0–0.1)
Basophils Relative: 0 %
Eosinophils Absolute: 0 10*3/uL (ref 0.0–0.5)
Eosinophils Relative: 0 %
HCT: 28.3 % — ABNORMAL LOW (ref 36.0–46.0)
Hemoglobin: 9.7 g/dL — ABNORMAL LOW (ref 12.0–15.0)
Lymphocytes Relative: 9 %
Lymphs Abs: 1.1 10*3/uL (ref 0.7–4.0)
MCH: 28.7 pg (ref 26.0–34.0)
MCHC: 34.3 g/dL (ref 30.0–36.0)
MCV: 83.7 fL (ref 80.0–100.0)
Monocytes Absolute: 0.5 10*3/uL (ref 0.1–1.0)
Monocytes Relative: 4 %
Neutro Abs: 10.5 10*3/uL — ABNORMAL HIGH (ref 1.7–7.7)
Neutrophils Relative %: 87 %
Platelets: 280 10*3/uL (ref 150–400)
RBC: 3.38 MIL/uL — ABNORMAL LOW (ref 3.87–5.11)
RDW: 14.4 % (ref 11.5–15.5)
WBC: 12.1 10*3/uL — ABNORMAL HIGH (ref 4.0–10.5)
nRBC: 0 % (ref 0.0–0.2)
nRBC: 0 /100 WBC

## 2019-08-03 LAB — ECHOCARDIOGRAM LIMITED
Height: 61 in
Weight: 2320 oz

## 2019-08-03 LAB — COMPREHENSIVE METABOLIC PANEL
ALT: 18 U/L (ref 0–44)
AST: 24 U/L (ref 15–41)
Albumin: 2.3 g/dL — ABNORMAL LOW (ref 3.5–5.0)
Alkaline Phosphatase: 83 U/L (ref 38–126)
Anion gap: 5 (ref 5–15)
BUN: 15 mg/dL (ref 6–20)
CO2: 25 mmol/L (ref 22–32)
Calcium: 7.6 mg/dL — ABNORMAL LOW (ref 8.9–10.3)
Chloride: 104 mmol/L (ref 98–111)
Creatinine, Ser: 0.5 mg/dL (ref 0.44–1.00)
GFR calc Af Amer: >60 mL/min (ref >60)
GFR calc non Af Amer: >60 mL/min (ref >60)
Glucose, Bld: 130 mg/dL — ABNORMAL HIGH (ref 70–99)
Potassium: 2.9 mmol/L — ABNORMAL LOW (ref 3.5–5.1)
Sodium: 134 mmol/L — ABNORMAL LOW (ref 135–145)
Total Bilirubin: 0.5 mg/dL (ref 0.3–1.2)
Total Protein: 6.3 g/dL — ABNORMAL LOW (ref 6.5–8.1)

## 2019-08-03 LAB — BASIC METABOLIC PANEL
Anion gap: 10 (ref 5–15)
BUN: 6 mg/dL (ref 6–20)
CO2: 22 mmol/L (ref 22–32)
Calcium: 7.6 mg/dL — ABNORMAL LOW (ref 8.9–10.3)
Chloride: 102 mmol/L (ref 98–111)
Creatinine, Ser: 0.55 mg/dL (ref 0.44–1.00)
GFR calc Af Amer: >60 mL/min (ref >60)
GFR calc non Af Amer: >60 mL/min (ref >60)
Glucose, Bld: 121 mg/dL — ABNORMAL HIGH (ref 70–99)
Potassium: 2.9 mmol/L — ABNORMAL LOW (ref 3.5–5.1)
Sodium: 134 mmol/L — ABNORMAL LOW (ref 135–145)

## 2019-08-03 LAB — MAGNESIUM
Magnesium: 2.3 mg/dL (ref 1.7–2.4)
Magnesium: 1.9 mg/dL (ref 1.7–2.4)

## 2019-08-03 LAB — PROTIME-INR
INR: 1.3 — ABNORMAL HIGH (ref 0.8–1.2)
Prothrombin Time: 16.2 seconds — ABNORMAL HIGH (ref 11.4–15.2)

## 2019-08-03 LAB — PHOSPHORUS: Phosphorus: 2.2 mg/dL — ABNORMAL LOW (ref 2.5–4.6)

## 2019-08-03 LAB — LACTIC ACID, PLASMA: Lactic Acid, Venous: 0.8 mmol/L (ref 0.5–1.9)

## 2019-08-03 LAB — PROCALCITONIN: Procalcitonin: 5.99 ng/mL

## 2019-08-03 LAB — HIV ANTIBODY (ROUTINE TESTING W REFLEX): HIV Screen 4th Generation wRfx: NONREACTIVE

## 2019-08-03 IMAGING — DX DG ABDOMEN 1V
1 series · 1 of 1 positions shown · non-contrast
Comparison: Abdominal CT yesterday.

CLINICAL DATA: Peritonitis.

EXAM:
ABDOMEN - 1 VIEW

[abdomen]
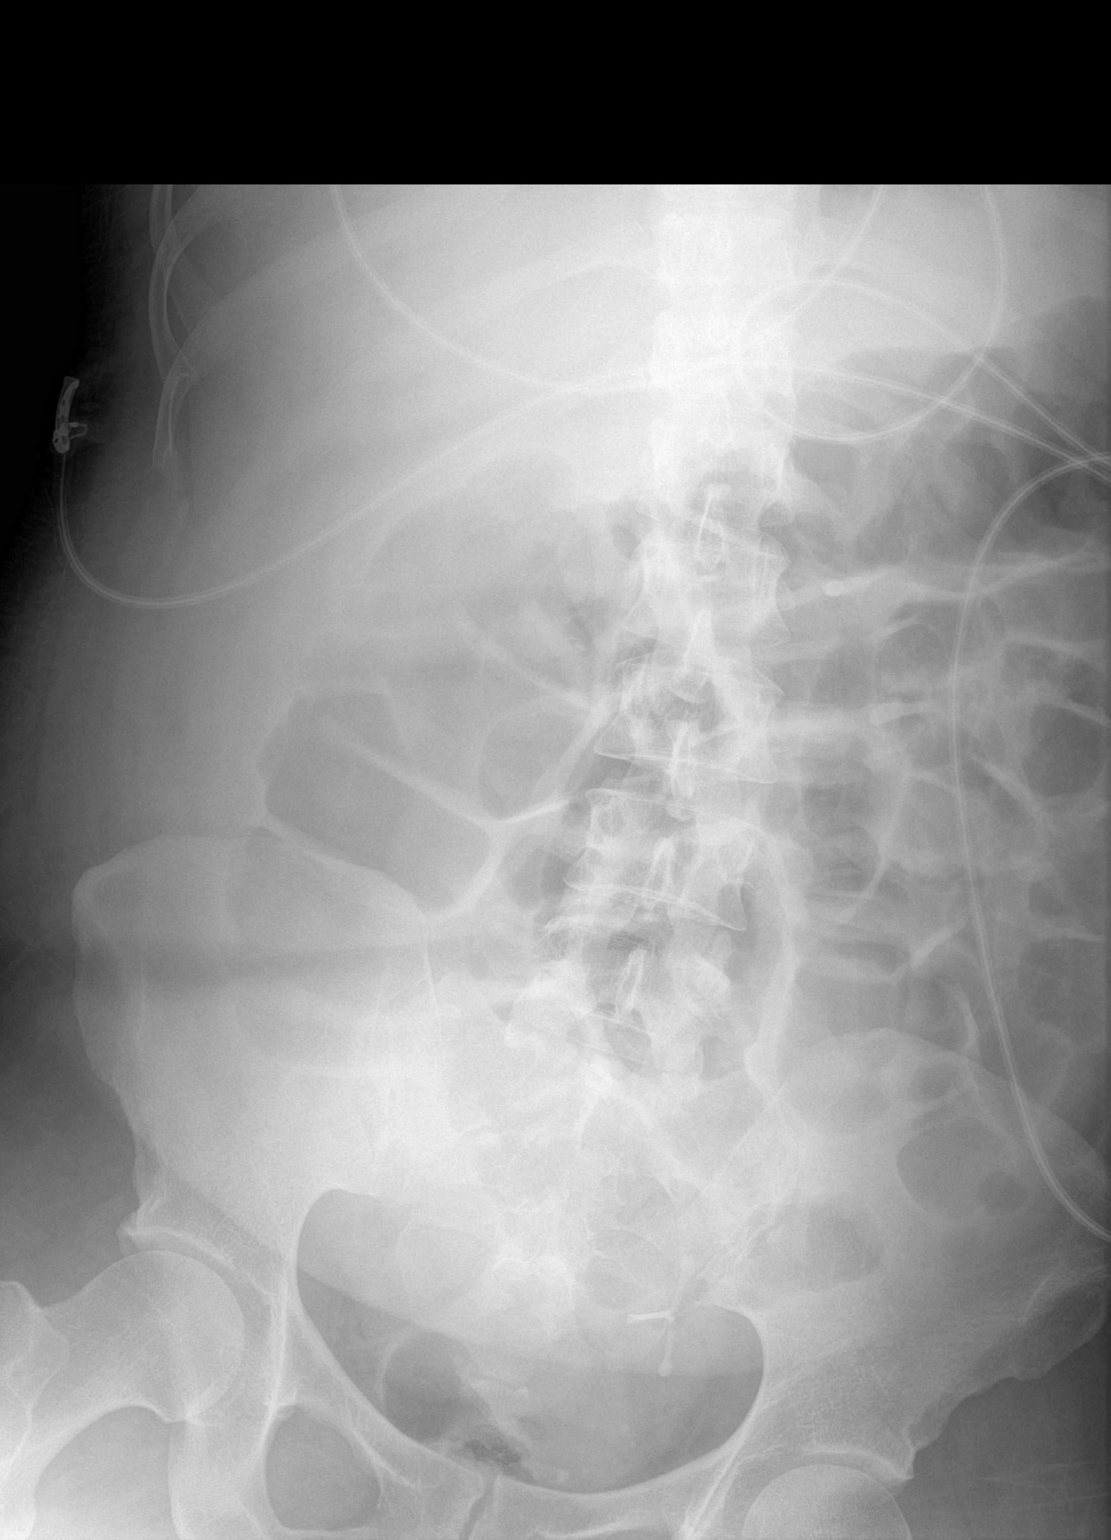

[1 of 1 positions shown; findings below may reference images not displayed]

FINDINGS: Left aspect of the abdomen not included in the field of view.
Air-filled small and large bowel in a nonobstructive pattern. No
evidence of free air. Intrauterine device in the left pelvis.
IMPRESSION: Nonobstructive bowel gas pattern, no evidence of free air. Left
aspect of the abdomen not included in the field of view.

## 2019-08-03 IMAGING — MR MR LUMBAR SPINE W/ CM
2 series · 28 of 48 positions shown · IV contrast (6ml Gadavist)
Comparison: MRI dated [DATE]

CLINICAL DATA: Extensive right iliopsoas abscess.

EXAM:
MRI LUMBAR SPINE WITH CONTRAST
TECHNIQUE: Postcontrast sagittal and axial images were obtained and compared
with the prior MRI of [DATE].
CONTRAST:  6mL GADAVIST GADOBUTROL 1 MMOL/ML IV SOLN

[Series 9: T1 fat-sat post-contrast · sagittal · 4.0mm · 0.81mm/px · 15 of 17 slices shown]
[im 1/17]
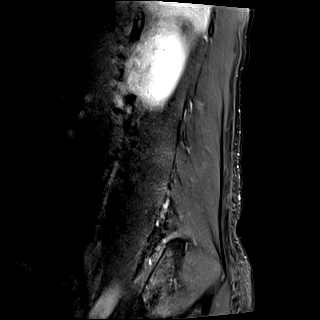
[im 2/17]
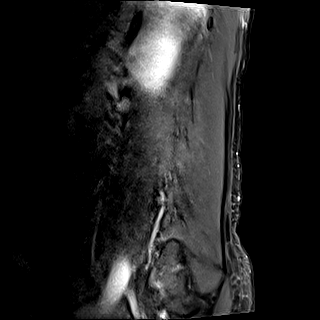
[im 3/17]
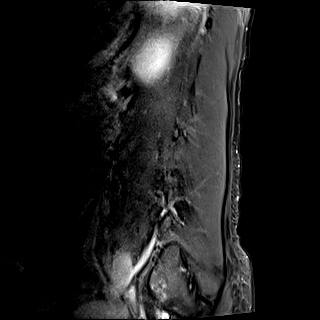
[im 4/17]
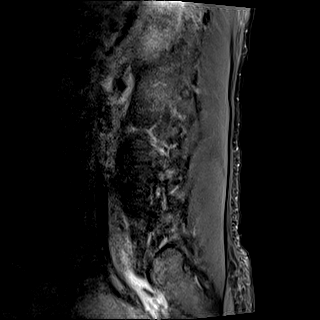
[im 5/17]
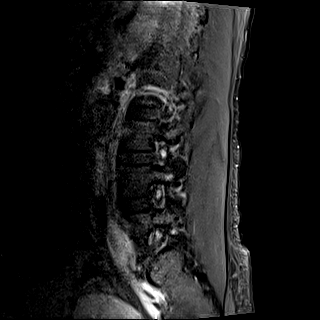
[im 6/17]
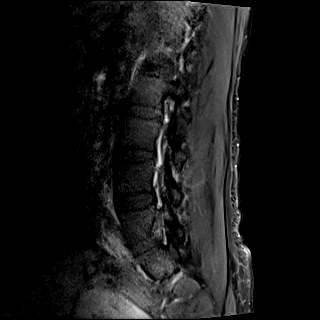
[im 7/17]
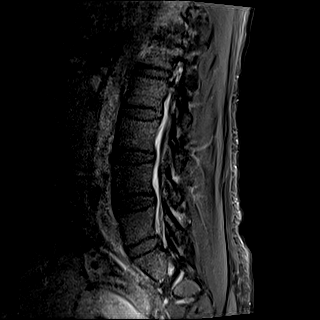
[im 9/17]
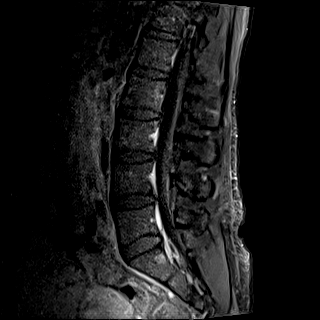
[im 10/17]
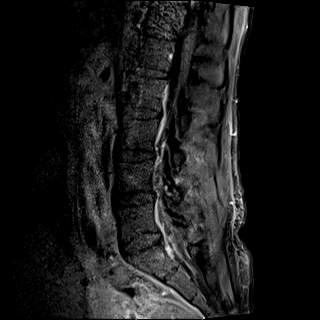
[im 11/17]
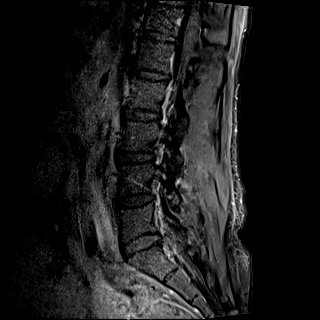
[im 12/17]
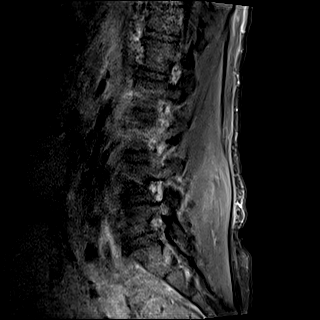
[im 13/17]
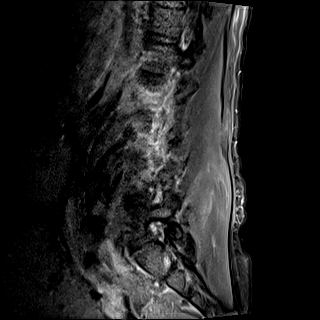
[im 14/17]
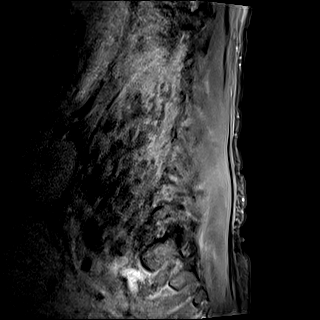
[im 15/17]
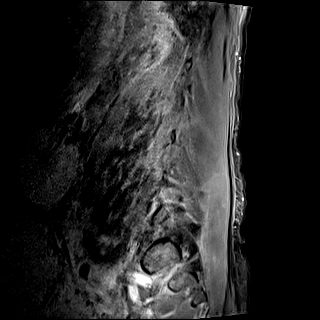
[im 17/17]
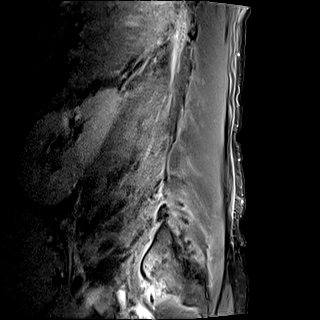

[Series 10: T1 post-contrast · axial · 4.0mm · 0.39mm/px · z∈[-101,+97]mm · 13 of 37 slices shown]
[im 1/37]
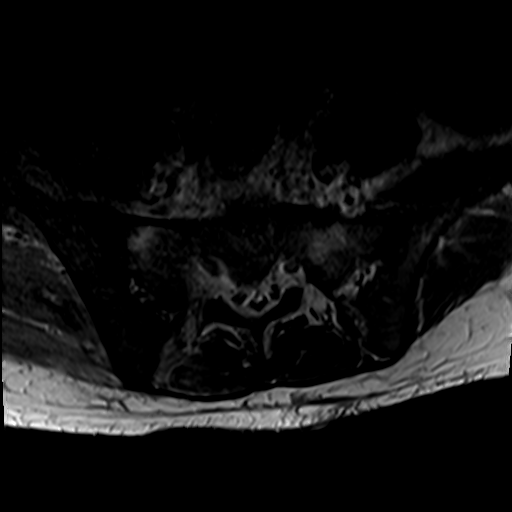
[im 2/37]
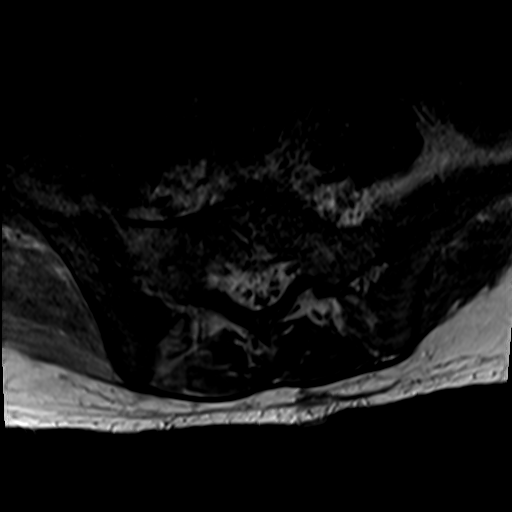
[im 3/37]
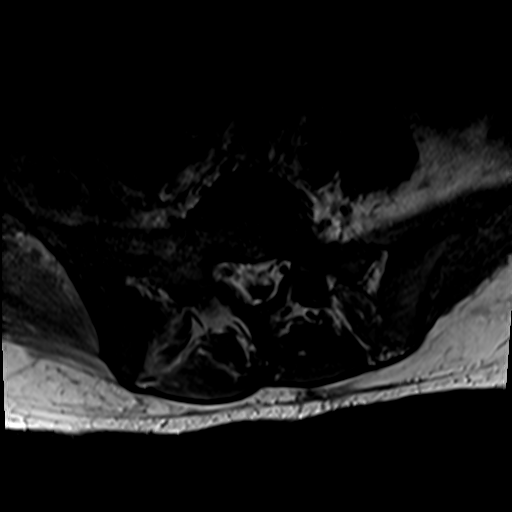
[im 6/37]
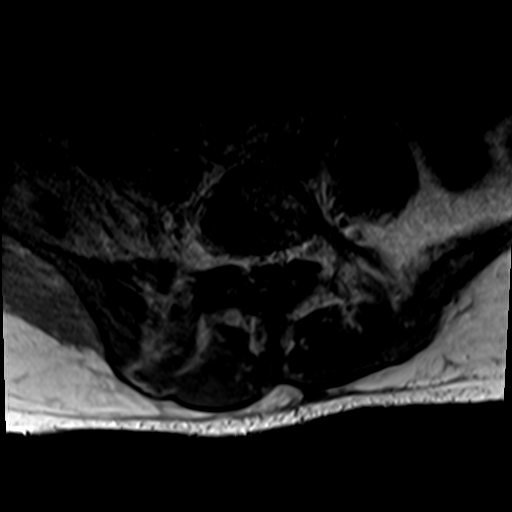
[im 7/37]
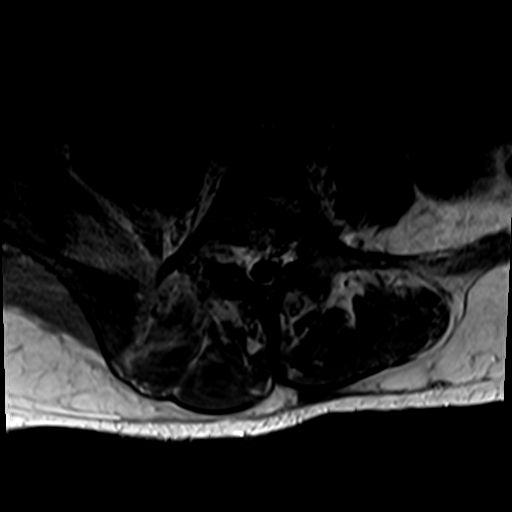
[im 12/37]
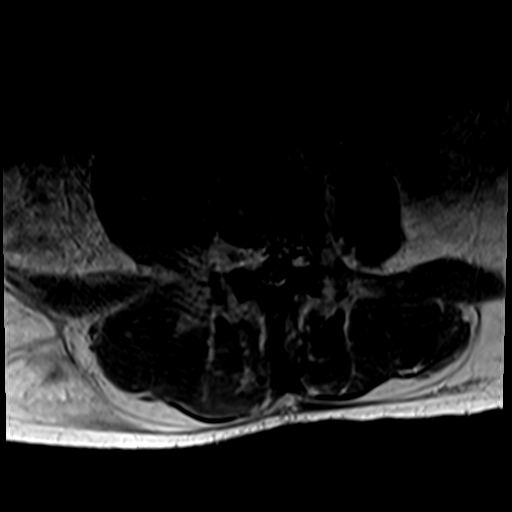
[im 16/37]
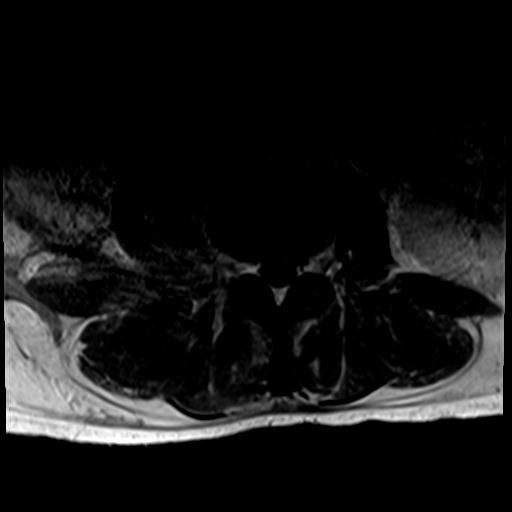
[im 19/37]
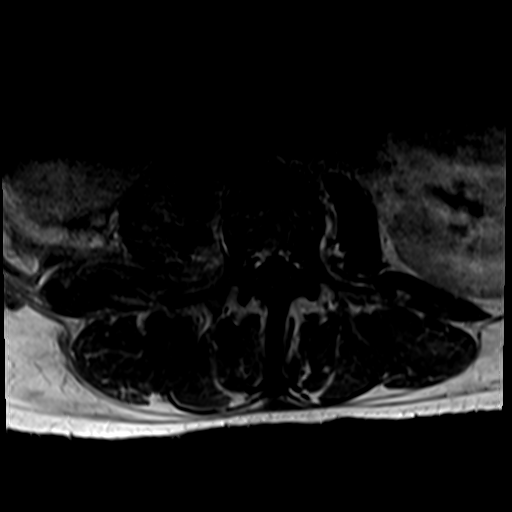
[im 21/37]
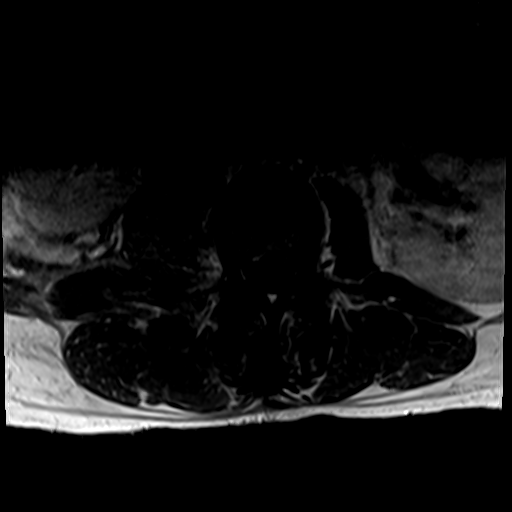
[im 25/37]
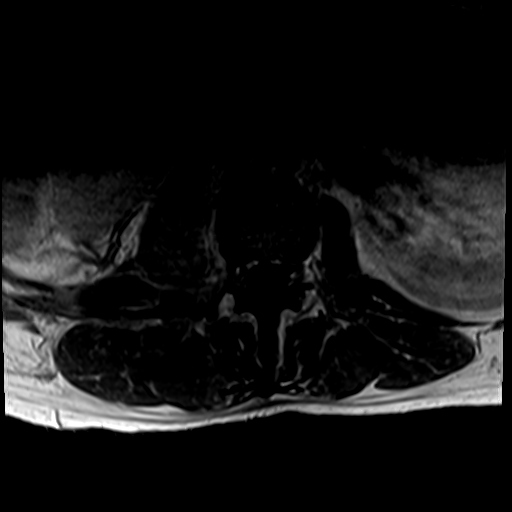
[im 30/37]
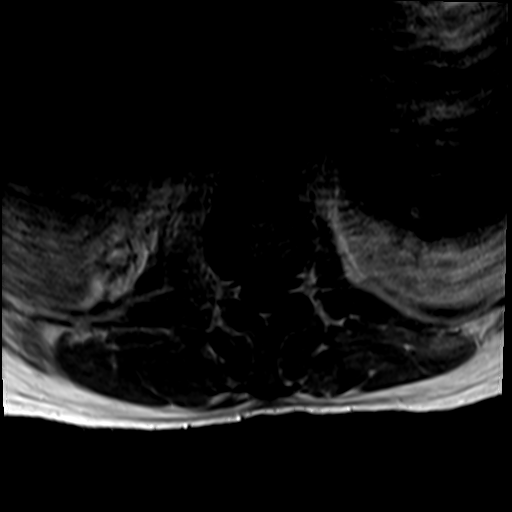
[im 31/37]
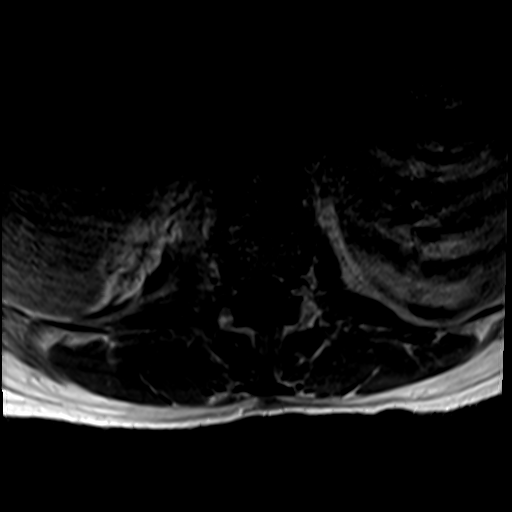
[im 34/37]
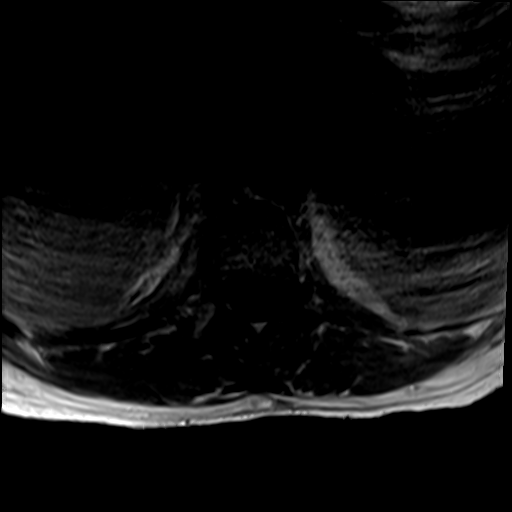

[28 of 48 positions shown; findings below may reference images not displayed]

FINDINGS: Segmentation:  Standard.

Alignment:  Physiologic.

Vertebrae:  No pathologic enhancement after contrast administration.

Paraspinal and other soft tissues: Extensive right iliopsoas abscess
is again noted. Septic right sacroiliac joint is noted with edema
and enhancement in the right gluteal muscles and in the right
multifidus muscle erector spinae muscle at the L5-S1 level.
Enhancement of the right sacral ala consistent with osteomyelitis.

Disc levels:

There is no pathologic enhancement within the spinal canal. No
evidence of epidural extension of the abscess at this time. No
pathologic epidural enhancement.
IMPRESSION: 1. Septic right sacroiliac joint with associated osteomyelitis of
the right sacral ala.
2. Extensive right iliopsoas abscess.
3. Edema and enhancement of the right gluteal muscles and right
multifidus muscle at the L5-S1 level consistent with myositis.
4. No evidence of epidural extension of abscess at this time.

## 2019-08-03 IMAGING — DX DG HAND 2V*R*
1 series · 2 of 2 positions shown · non-contrast
Comparison: Right hand radiograph [DATE]

CLINICAL DATA: Abscess. Peritonitis.

EXAM:
RIGHT HAND - 2 VIEW

[Series 1: hand · 0.14mm/px · 2 of 2 slices shown]
[im 1/2]
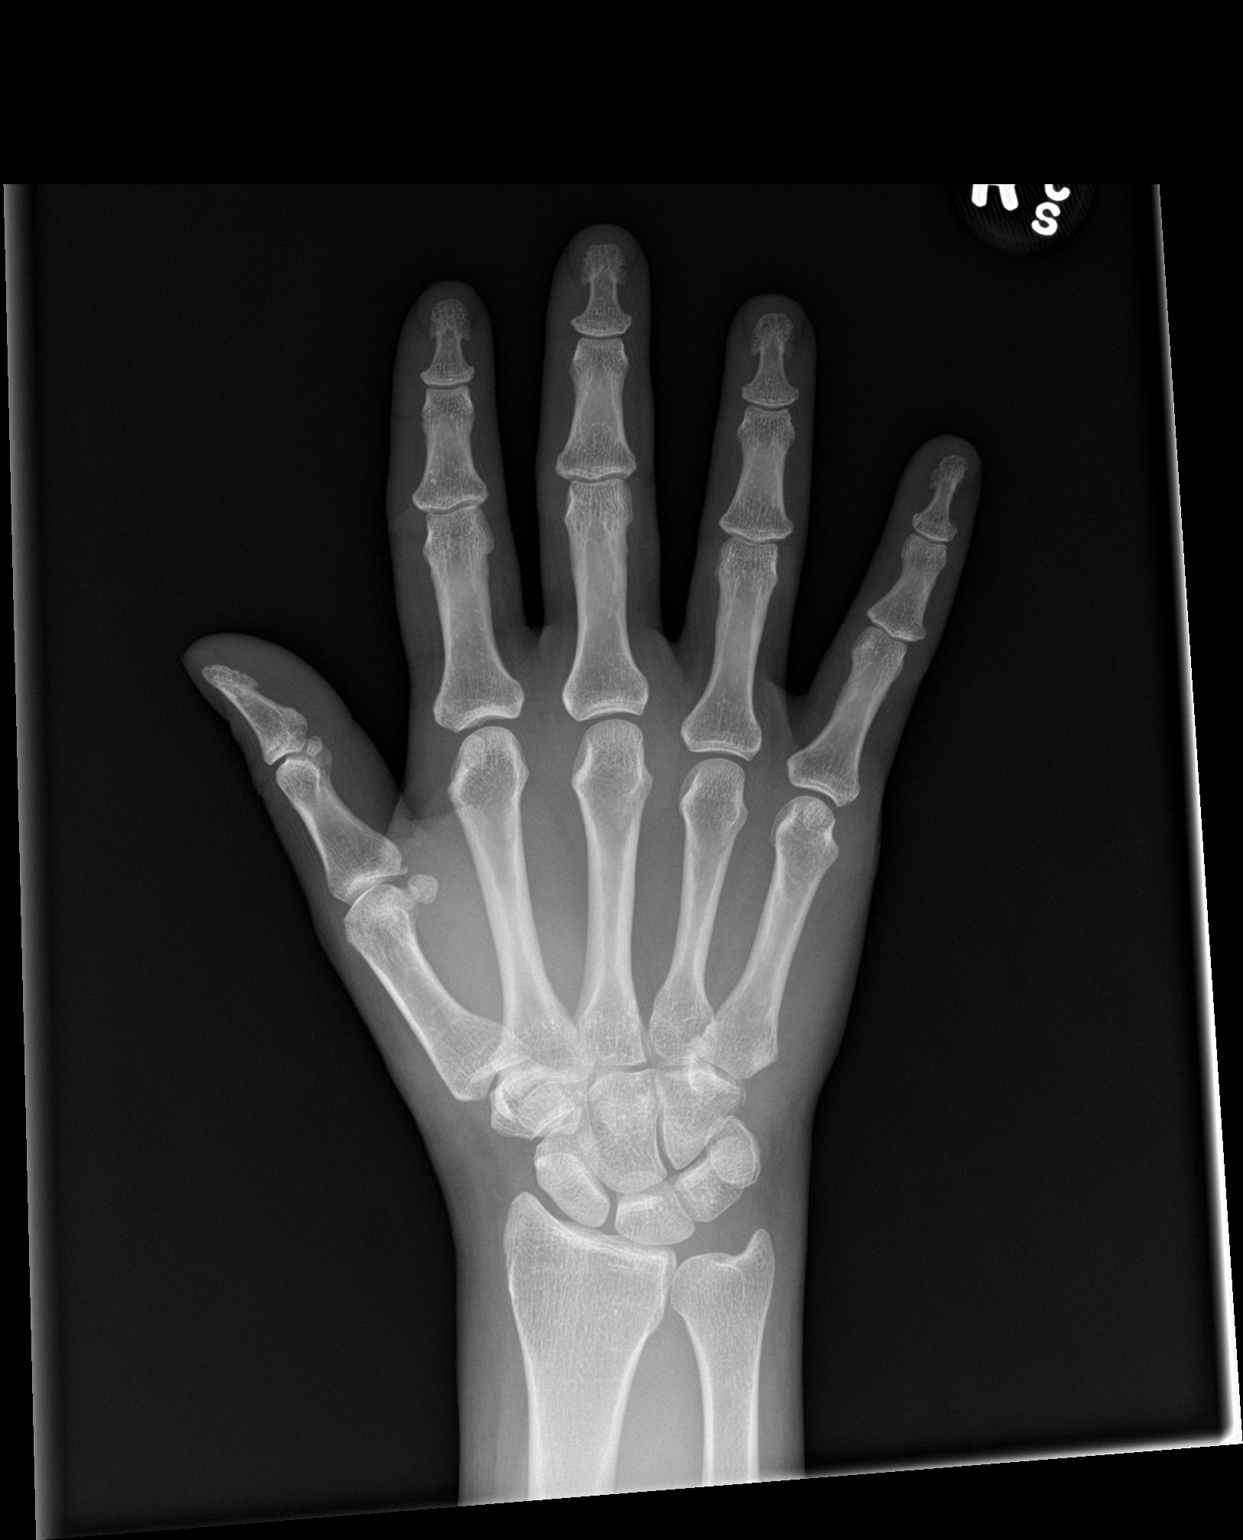
[im 2/2]
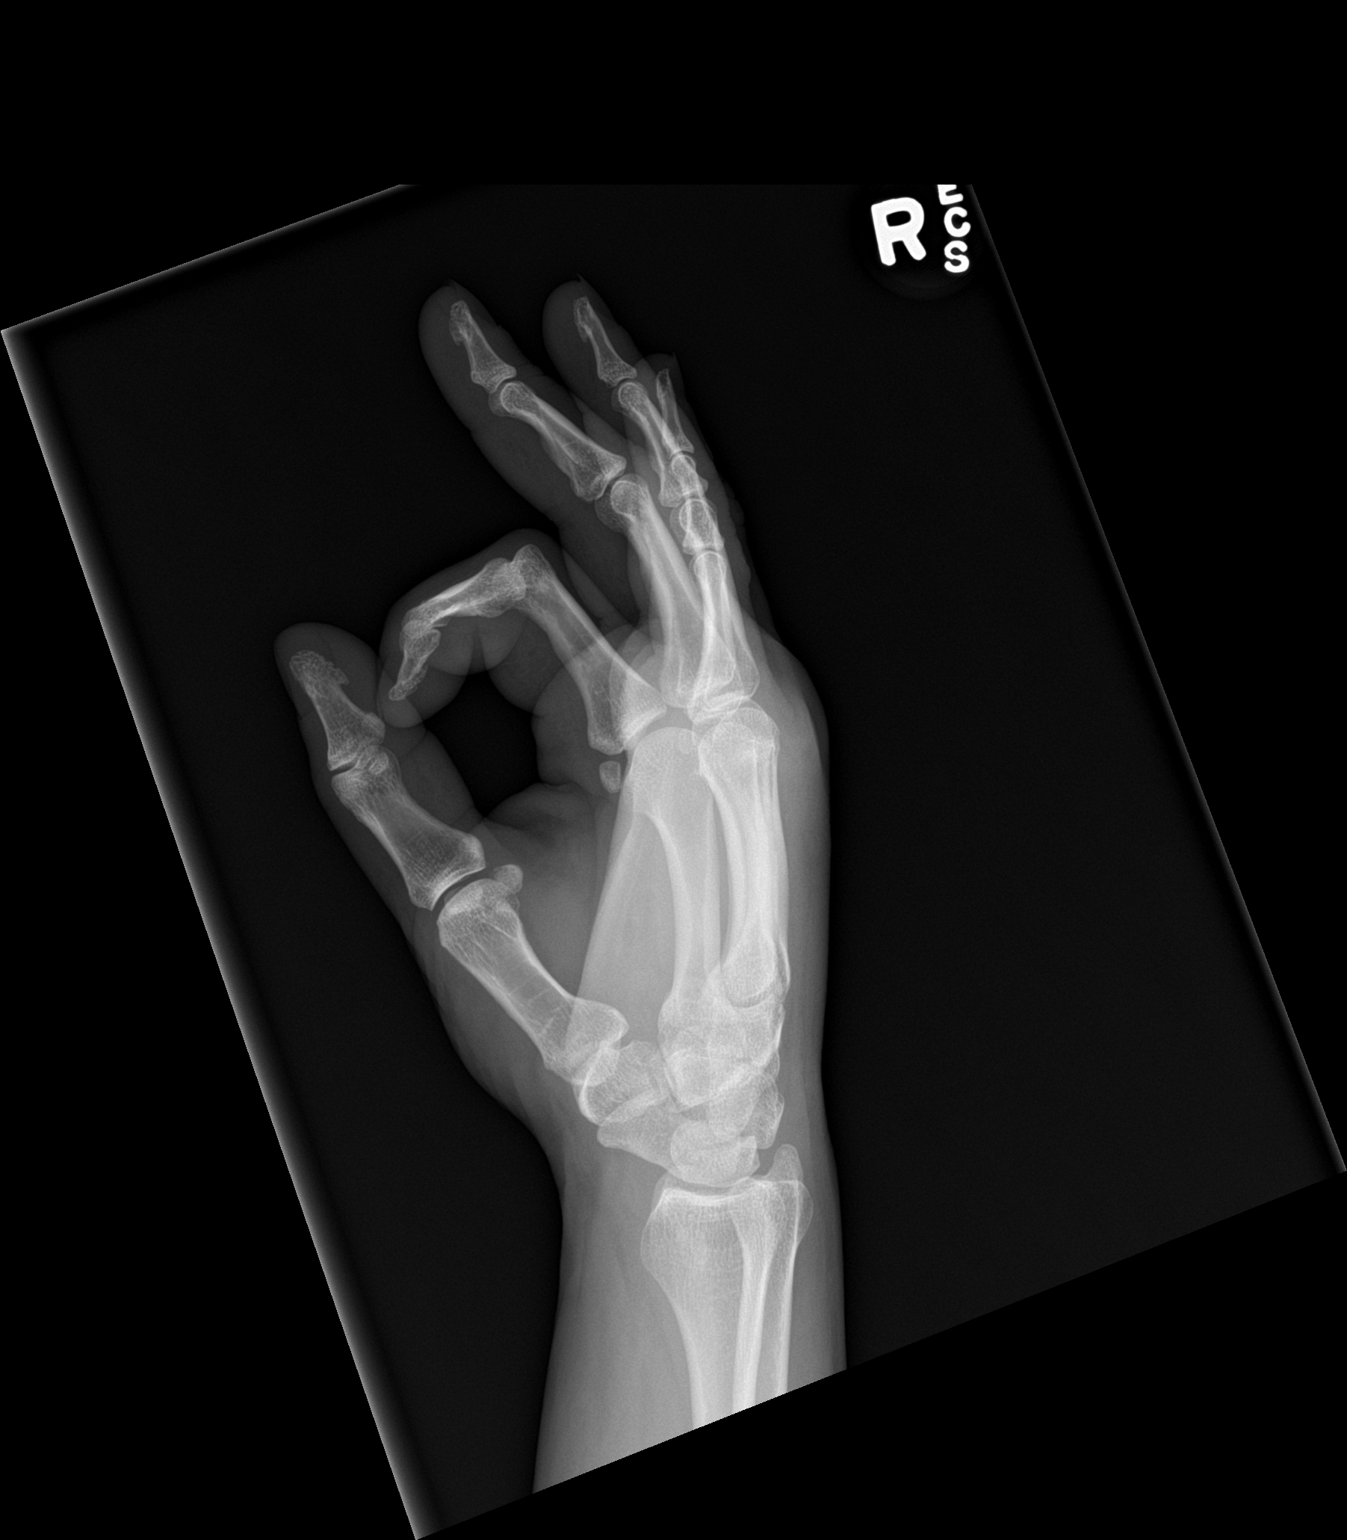

[2 of 2 positions shown; findings below may reference images not displayed]

FINDINGS: There is no evidence of fracture or dislocation. There is no
evidence of arthropathy or other focal bone abnormality. Generalized
soft tissue edema. No soft tissue air or radiopaque foreign body.
IMPRESSION: Generalized soft tissue edema. No soft tissue air or radiopaque
foreign body. No osseous abnormality.

## 2019-08-03 IMAGING — DX DG SHOULDER 1V*R*
1 series · 1 of 1 positions shown · non-contrast
Comparison: Chest CT yesterday.

CLINICAL DATA: Abscess.

EXAM:
RIGHT SHOULDER - 1 VIEW

[shoulder]
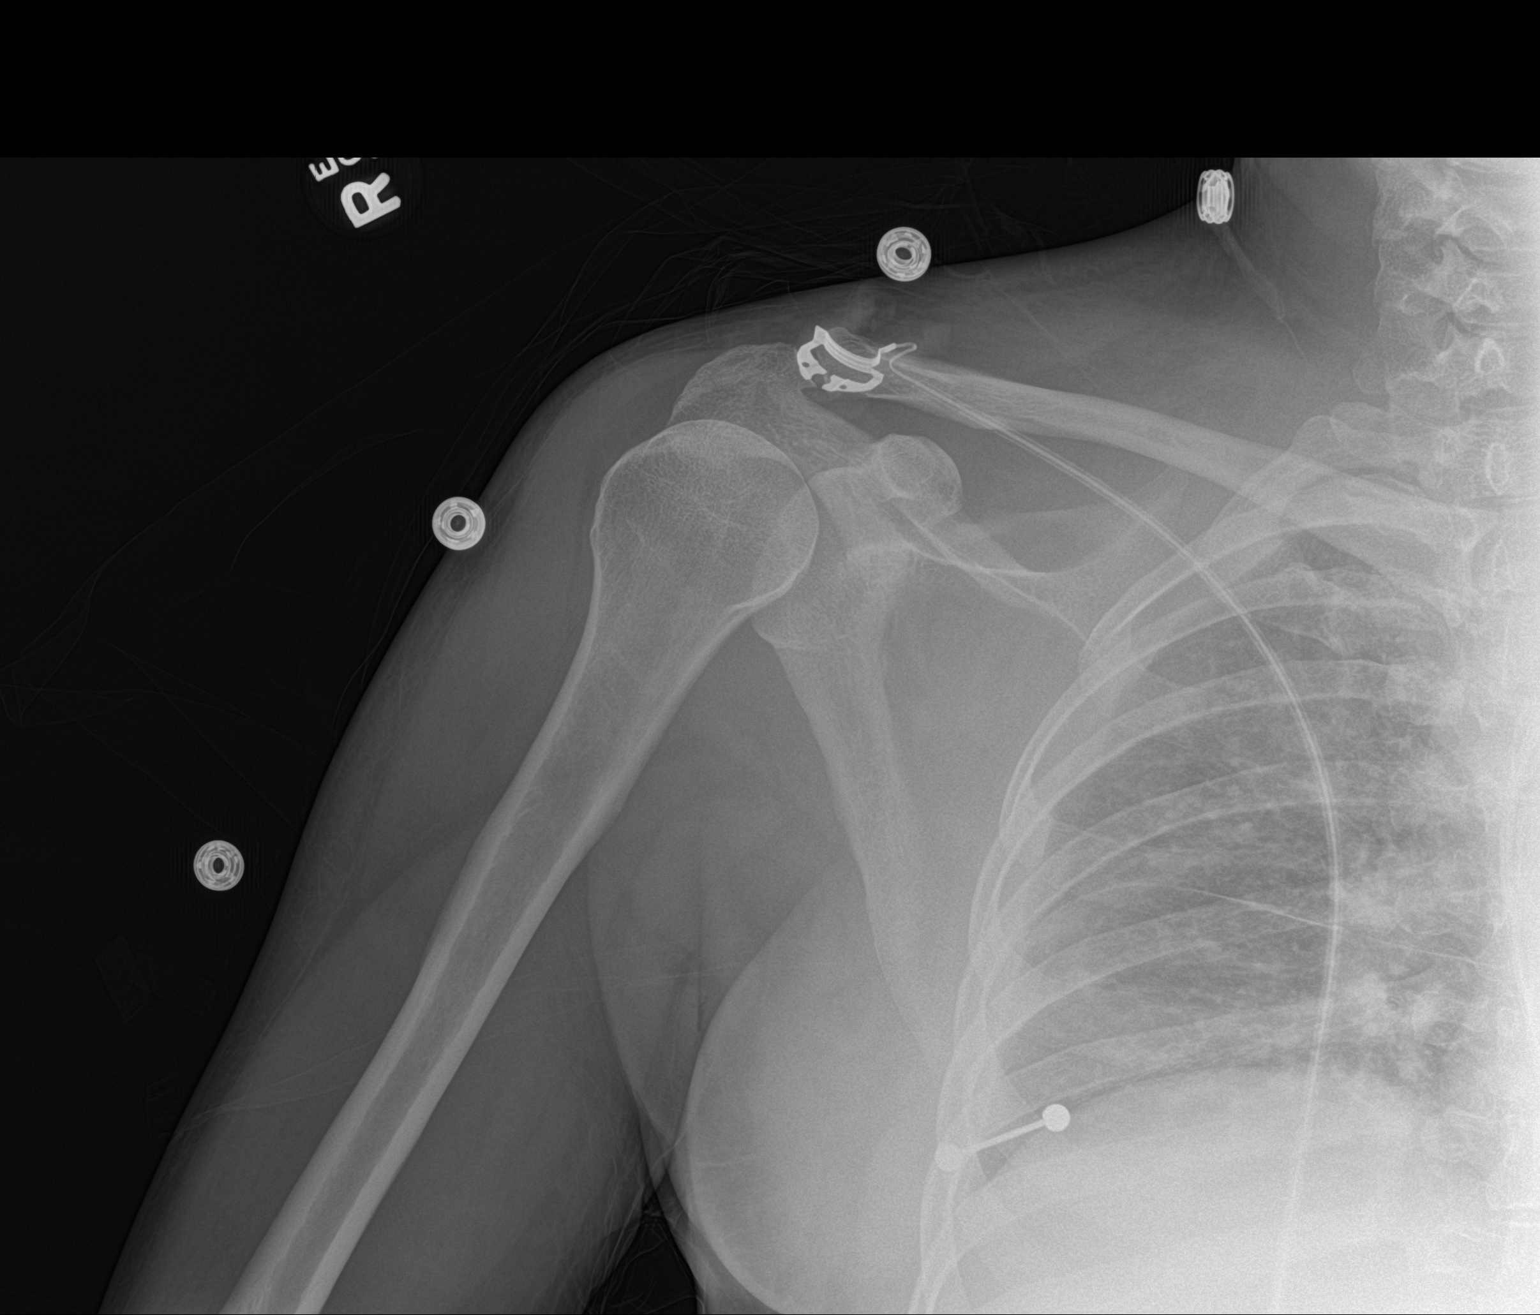

[1 of 1 positions shown; findings below may reference images not displayed]

FINDINGS: There is no evidence of fracture or dislocation on single view.
Overlying monitoring device projects over the acromioclavicular
joint limiting assessment. No bony destruction or periosteal
reaction. Mild soft tissue edema. No soft tissue air or radiopaque
foreign body.
IMPRESSION: Mild soft tissue edema. No acute osseous abnormality.

## 2019-08-03 IMAGING — DX DG CHEST 1V PORT
1 series · 1 of 1 positions shown · non-contrast
Comparison: Single-view of the chest [DATE].

CLINICAL DATA: Status post central line placement.

EXAM:
PORTABLE CHEST 1 VIEW

[chest ap]
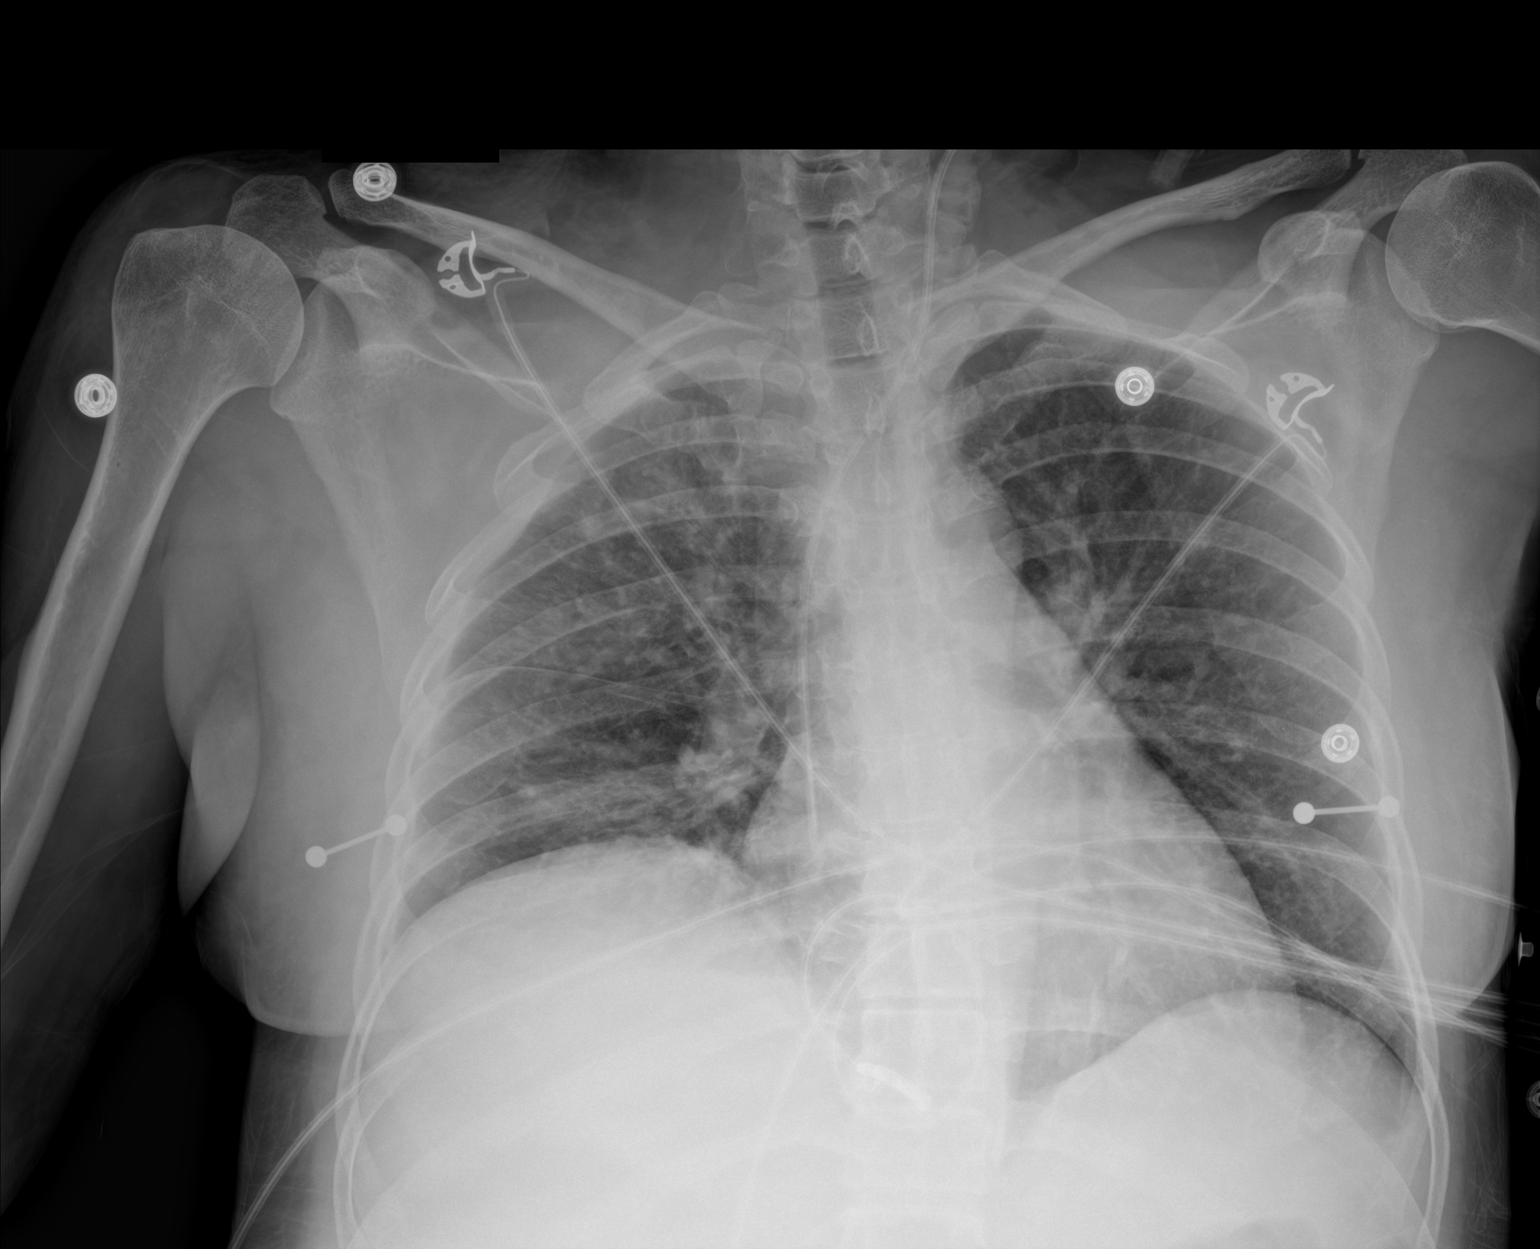

[1 of 1 positions shown; findings below may reference images not displayed]

FINDINGS: Left IJ approach central venous catheter is in place with the tip
projecting in the right atrium. Recommend 3 cm withdrawal. Elevation
of the right hemidiaphragm is noted. There is mild basilar
atelectasis. No pneumothorax or pleural effusion. Heart size is
normal.
IMPRESSION: Left IJ catheter tip projects in the right atrium. Recommend
withdrawal of 3 cm. Negative for pneumothorax. No acute disease.

## 2019-08-03 MED ORDER — POTASSIUM CHLORIDE 10 MEQ/100ML IV SOLN: 10.0000 meq | INTRAVENOUS | Status: DC

## 2019-08-03 MED ORDER — CLINDAMYCIN PHOSPHATE 900 MG/50ML IV SOLN
900.0000 mg | Freq: Three times a day (TID) | INTRAVENOUS | Status: DC
  Administered 2019-08-03 – 2019-08-04 (×2): 900 mg via INTRAVENOUS
  Filled 2019-08-03 (×3): qty 50

## 2019-08-03 MED ORDER — LACTATED RINGERS IV BOLUS
1000.0000 mL | Freq: Once | INTRAVENOUS | Status: AC
  Administered 2019-08-03: 1000 mL via INTRAVENOUS

## 2019-08-03 MED ORDER — ENOXAPARIN SODIUM 40 MG/0.4ML ~~LOC~~ SOLN: 40.0000 mg | SUBCUTANEOUS | Status: DC

## 2019-08-03 MED ORDER — NOREPINEPHRINE 4 MG/250ML-% IV SOLN: 0.0000 ug/min | INTRAVENOUS | Status: DC

## 2019-08-03 MED ORDER — DEXTROSE IN LACTATED RINGERS 5 % IV SOLN: INTRAVENOUS | Status: DC

## 2019-08-03 MED ORDER — OXYCODONE HCL 5 MG PO TABS
10.0000 mg | ORAL_TABLET | ORAL | Status: DC | PRN
  Administered 2019-08-03: 10 mg via ORAL
  Filled 2019-08-03: qty 2

## 2019-08-03 MED ORDER — VANCOMYCIN HCL 750 MG/150ML IV SOLN
750.0000 mg | Freq: Three times a day (TID) | INTRAVENOUS | Status: DC
  Administered 2019-08-03: 750 mg via INTRAVENOUS
  Filled 2019-08-03 (×4): qty 150

## 2019-08-03 MED ORDER — ACETAMINOPHEN 325 MG PO TABS
650.0000 mg | ORAL_TABLET | Freq: Four times a day (QID) | ORAL | Status: DC | PRN
  Administered 2019-08-03 (×2): 650 mg via ORAL
  Filled 2019-08-03 (×2): qty 2

## 2019-08-03 MED ORDER — POTASSIUM CHLORIDE 10 MEQ/100ML IV SOLN
10.0000 meq | INTRAVENOUS | Status: DC
  Administered 2019-08-03 (×2): 10 meq via INTRAVENOUS
  Filled 2019-08-03 (×6): qty 100

## 2019-08-03 MED ORDER — FENTANYL CITRATE (PF) 100 MCG/2ML IJ SOLN
INTRAMUSCULAR | Status: AC
  Administered 2019-08-03: 50 ug
  Filled 2019-08-03: qty 2

## 2019-08-03 MED ORDER — TRAZODONE HCL 50 MG PO TABS
150.0000 mg | ORAL_TABLET | Freq: Every day | ORAL | Status: DC
  Administered 2019-08-03 – 2019-09-02 (×31): 150 mg via ORAL
  Filled 2019-08-03 (×14): qty 1
  Filled 2019-08-03: qty 3
  Filled 2019-08-03 (×16): qty 1

## 2019-08-03 MED ORDER — DEXTROSE-NACL 5-0.45 % IV SOLN: 150.0000 mL/h | INTRAVENOUS | Status: DC

## 2019-08-03 MED ORDER — POTASSIUM CHLORIDE 10 MEQ/50ML IV SOLN
10.0000 meq | INTRAVENOUS | Status: DC
  Administered 2019-08-03 (×2): 10 meq via INTRAVENOUS
  Filled 2019-08-03 (×3): qty 50

## 2019-08-03 MED ORDER — SODIUM CHLORIDE 0.9 % IV SOLN
2.0000 g | Freq: Three times a day (TID) | INTRAVENOUS | Status: DC
  Administered 2019-08-03 – 2019-08-04 (×2): 2 g via INTRAVENOUS
  Filled 2019-08-03 (×4): qty 2

## 2019-08-03 MED ORDER — DEXTROSE IN LACTATED RINGERS 5 % IV SOLN
INTRAVENOUS | Status: DC
  Filled 2019-08-03 (×2): qty 1000

## 2019-08-03 MED ORDER — VANCOMYCIN HCL 500 MG/100ML IV SOLN: 500.0000 mg | Freq: Once | INTRAVENOUS | 0 refills | Status: DC

## 2019-08-03 MED ORDER — ACETAMINOPHEN 10 MG/ML IV SOLN: 1000.0000 mg | Freq: Once | INTRAVENOUS | 0 refills | Status: DC

## 2019-08-03 MED ORDER — FENTANYL CITRATE (PF) 100 MCG/2ML IJ SOLN
50.0000 ug | Freq: Once | INTRAMUSCULAR | Status: AC
  Administered 2019-08-03: 50 ug via INTRAVENOUS
  Filled 2019-08-03: qty 2

## 2019-08-03 MED ORDER — MIDAZOLAM HCL 2 MG/2ML IJ SOLN: 2.0000 mg | Freq: Once | INTRAMUSCULAR | Status: AC

## 2019-08-03 MED ORDER — SODIUM CHLORIDE 0.9 % IV SOLN: 2.0000 g | Freq: Three times a day (TID) | INTRAVENOUS | Status: DC

## 2019-08-03 MED ORDER — FENTANYL CITRATE (PF) 100 MCG/2ML IJ SOLN
INTRAMUSCULAR | Status: AC
  Administered 2019-08-03: 50 ug via INTRAVENOUS
  Filled 2019-08-03: qty 2

## 2019-08-03 MED ORDER — ACETAMINOPHEN 325 MG PO TABS: 650.0000 mg | ORAL_TABLET | Freq: Four times a day (QID) | ORAL | Status: DC | PRN

## 2019-08-03 MED ORDER — MORPHINE SULFATE (PF) 2 MG/ML IV SOLN
2.0000 mg | INTRAVENOUS | Status: DC | PRN
  Administered 2019-08-03: 4 mg via INTRAVENOUS
  Administered 2019-08-03 – 2019-08-04 (×2): 2 mg via INTRAVENOUS
  Administered 2019-08-04: 04:00:00 4 mg via INTRAVENOUS
  Filled 2019-08-03 (×2): qty 1
  Filled 2019-08-03: qty 2
  Filled 2019-08-03: qty 1
  Filled 2019-08-03: qty 2

## 2019-08-03 MED ORDER — ACETAMINOPHEN 10 MG/ML IV SOLN
1000.0000 mg | Freq: Once | INTRAVENOUS | Status: AC
  Administered 2019-08-03: 1000 mg via INTRAVENOUS
  Filled 2019-08-03: qty 100

## 2019-08-03 MED ORDER — MUPIROCIN 2 % EX OINT
1.0000 "application " | TOPICAL_OINTMENT | Freq: Two times a day (BID) | CUTANEOUS | Status: DC
  Administered 2019-08-03: 1 via NASAL
  Filled 2019-08-03: qty 22

## 2019-08-03 MED ORDER — GADOBUTROL 1 MMOL/ML IV SOLN
6.0000 mL | Freq: Once | INTRAVENOUS | Status: AC | PRN
  Administered 2019-08-03: 6 mL via INTRAVENOUS

## 2019-08-03 MED ORDER — FENTANYL CITRATE (PF) 100 MCG/2ML IJ SOLN: 50.0000 ug | INTRAMUSCULAR | Status: DC | PRN

## 2019-08-03 MED ORDER — FENTANYL CITRATE (PF) 100 MCG/2ML IJ SOLN
25.0000 ug | INTRAMUSCULAR | Status: DC | PRN
  Administered 2019-08-03 (×3): 25 ug via INTRAVENOUS
  Filled 2019-08-03: qty 2

## 2019-08-03 MED ORDER — MIDAZOLAM HCL 2 MG/2ML IJ SOLN
INTRAMUSCULAR | Status: AC
  Administered 2019-08-03: 2 mg via INTRAVENOUS
  Filled 2019-08-03: qty 2

## 2019-08-03 MED ORDER — ACETAMINOPHEN 10 MG/ML IV SOLN
1000.0000 mg | Freq: Four times a day (QID) | INTRAVENOUS | Status: AC
  Administered 2019-08-03: 1000 mg via INTRAVENOUS
  Filled 2019-08-03 (×2): qty 100

## 2019-08-03 MED ORDER — CHLORHEXIDINE GLUCONATE CLOTH 2 % EX PADS: 6.0000 | MEDICATED_PAD | Freq: Every day | CUTANEOUS | Status: DC

## 2019-08-03 MED ORDER — SODIUM CHLORIDE 0.9 % IV SOLN
500.0000 mg | INTRAVENOUS | Status: AC
  Administered 2019-08-03: 500 mg via INTRAVENOUS
  Filled 2019-08-03: qty 10

## 2019-08-03 MED ORDER — LIDOCAINE VISCOUS HCL 2 % MT SOLN
15.0000 mL | OROMUCOSAL | Status: DC | PRN
  Administered 2019-08-03: 14:00:00 15 mL via OROMUCOSAL
  Filled 2019-08-03: qty 15

## 2019-08-03 MED ORDER — CLINDAMYCIN PHOSPHATE 900 MG/50ML IV SOLN: 900.0000 mg | Freq: Three times a day (TID) | INTRAVENOUS | Status: DC

## 2019-08-03 MED ORDER — OXYCODONE HCL 5 MG PO TABS
5.0000 mg | ORAL_TABLET | Freq: Four times a day (QID) | ORAL | Status: DC | PRN
  Administered 2019-08-03 (×2): 5 mg via ORAL
  Filled 2019-08-03 (×2): qty 1

## 2019-08-03 MED ORDER — ENOXAPARIN SODIUM 40 MG/0.4ML ~~LOC~~ SOLN
40.0000 mg | SUBCUTANEOUS | Status: DC
  Administered 2019-08-03 – 2019-08-09 (×7): 40 mg via SUBCUTANEOUS
  Filled 2019-08-03 (×7): qty 0.4

## 2019-08-03 MED ORDER — ALBUMIN HUMAN 25 % IV SOLN: 25.0000 g | Freq: Once | INTRAVENOUS | Status: DC

## 2019-08-03 MED ORDER — VANCOMYCIN HCL 750 MG/150ML IV SOLN: 750.0000 mg | Freq: Three times a day (TID) | INTRAVENOUS | Status: DC

## 2019-08-03 MED ORDER — CLINDAMYCIN PHOSPHATE 900 MG/50ML IV SOLN
900.0000 mg | Freq: Three times a day (TID) | INTRAVENOUS | Status: DC
  Administered 2019-08-03: 900 mg via INTRAVENOUS
  Filled 2019-08-03 (×4): qty 50

## 2019-08-03 MED ORDER — OXYCODONE HCL 5 MG PO TABS: 5.0000 mg | ORAL_TABLET | Freq: Four times a day (QID) | ORAL | 0 refills | Status: DC | PRN

## 2019-08-03 MED ORDER — ALBUMIN HUMAN 25 % IV SOLN
25.0000 g | Freq: Once | INTRAVENOUS | Status: AC
  Administered 2019-08-03: 25 g via INTRAVENOUS
  Filled 2019-08-03: qty 50

## 2019-08-03 MED ORDER — VANCOMYCIN HCL 750 MG/150ML IV SOLN
750.0000 mg | Freq: Three times a day (TID) | INTRAVENOUS | Status: DC
  Administered 2019-08-03 – 2019-08-05 (×5): 750 mg via INTRAVENOUS
  Filled 2019-08-03 (×8): qty 150

## 2019-08-03 MED ORDER — ONDANSETRON HCL 4 MG/2ML IJ SOLN: 4.0000 mg | Freq: Four times a day (QID) | INTRAMUSCULAR | Status: DC | PRN

## 2019-08-03 MED ORDER — SODIUM CHLORIDE 0.9 % IV SOLN: 500.0000 mg | INTRAVENOUS | Status: DC

## 2019-08-03 NOTE — Procedures (Signed)
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  Consent:emergent, patient consented.  Hand washing performed prior to starting the procedure.   Procedure:   An active timeout was performed and correct patient, name, & ID confirmed.   Patient was positioned correctly for central venous access.  Patient was prepped using strict sterile technique including chlorohexadine preps, sterile drape, sterile gown and sterile gloves.    The area was prepped, draped and anesthetized in the usual sterile manner. Patient comfort was obtained with IV Versed and fentanyl.    A triple lumen catheter was placed in LEFT internal Jugular Vein.  There was good blood return, catheter caps were placed on lumens, catheter flushed easily, the line was secured and a sterile dressing and BIO-PATCH applied.   Ultrasound was used to visualize vasculature and guidance of needle.   Patient appears to be significantly volume depleted by collapsible nature of the internal jugular vein.  We will continue with volume resuscitation as well.   Number of Attempts: 1 Complications:none Estimated Blood Loss: Less than 5 mL Chest Radiograph no pneumothorax, tip overlying the cavoatrial junction.  Operator: Windy Canny. Derrill Kay, MD [facility]  *This note was dictated using voice recognition software/Dragon.  Despite best efforts to proofread, errors can occur which can change the meaning.  Any change was purely unintentional.

## 2019-08-03 NOTE — Progress Notes (Signed)
Per MD increase fluids to 150 ml/hr and will order bolus. No additional orders at this time.

## 2019-08-03 NOTE — Progress Notes (Signed)
Pt with a hx of IVDU who was tx from [facility] for MRSA bacteremia and psoas abscess. She was seen by ID there by Dr Ola Spurr. She got one dose of daptomycin there. Plan to cont vanc/cefepime/clinda per Dr. Ola Spurr.  Onnie Boer, PharmD, BCIDP, AAHIVP, CPP Infectious Disease Pharmacist 08/03/2019 8:29 PM

## 2019-08-03 NOTE — Progress Notes (Signed)
*  PRELIMINARY RESULTS* Echocardiogram 2D Echocardiogram has been performed.  Sherrie Sport 08/03/2019, 9:32 AM

## 2019-08-03 NOTE — Progress Notes (Addendum)
Pharmacy Antibiotic Note  Leslie Molina is a 34 y.o. female admitted on 08/02/2019 with sepsis.  Pharmacy has been consulted for Vancomycin ,Cefepime  Dosing. Patient with iliopsoas abscess, osteomyelitis of iliac bone and sacrum.  Aspiration of abscess has GPC pairs in clusters on gram stain.  Blood cultures GPC clusters and BCID = MRSA.      Plan: - Change vancomycin to 750mg  IV q8h based on age and renal function.  Using actual SCr (0.5 mg/dl) anticipate AUC 458.    - Check SCr in am and plan for peak and trough 3/5 - Per ID give daptomycin 500mg  IV x 1 (~7.5 mg/kg).  CK is elevated but likely from infection.  Will repeat CK in am in case plans to continue beyond one-time dose today.   - Cefepime 2 gm IV Q8H - ID to determine need to continue based on MRSA findings in cultures   Vd = 47.4 L  Ke = 0.104 hr-1 T1/2 = 7 hrs AUC = 458 Vanc trough = 13.6    Height: 5\' 1"  (154.9 cm) Weight: 145 lb (65.8 kg) IBW/kg (Calculated) : 47.8  Temp (24hrs), Avg:101.6 F (38.7 C), Min:98.3 F (36.8 C), Max:103.3 F (39.6 C)  Recent Labs  Lab 08/02/19 0843 08/02/19 1130 08/03/19 0250  WBC 14.0*  --   --   CREATININE 0.60  --  0.50  LATICACIDVEN  --  1.4  --     Estimated Creatinine Clearance: 86 mL/min (by C-G formula based on SCr of 0.5 mg/dL).    No Known Allergies  Antimicrobials this admission: 3/3 metronidazole x 1 3/3 vancomycin >>  3/3 cefepime >> 3/4 daptomycin 500mg  x 1   Dose adjustments this admission:  Microbiology results: 3/3 BCx: GPC clusters 4/4 bottles  (BCID = MRSA) 3/3 iliopsoas abscess: GPC pairs in clusters  MRSA PCR: positive  Thank you for allowing pharmacy to be a part of this patient's care.  Doreene Eland, PharmD, BCPS.   Work Cell: [phone] 08/03/2019 9:25 AM

## 2019-08-03 NOTE — Progress Notes (Addendum)
eLink Physician-Brief Progress Note Patient Name: Magdiel Choma DOB: [birth year] MRN: [id number]   Date of Service  08/03/2019  HPI/Events of Note  Insomnia. Also inadequate pain control.  eICU Interventions  Trazodone 150mg  QHS PRN insomnia ordered. Will add one time dose of IV tylenol for pain since this reportedly worked well earlier per [employer].      Intervention Category Minor Interventions: Routine modifications to care plan (e.g. PRN medications for pain, fever)  Marily Lente Eran Windish 08/03/2019, 11:33 PM

## 2019-08-03 NOTE — Consult Note (Signed)
Leslie Molina is a 34 yo single P3 transferred from [place] today with the diagnosis of a large psoas abscesses, Right sacroiliatis, bacteremia with MRSA. A CT was done yesterday morning that showed that her 35 year old IUD has the tips of the "T" through the uterus. It appears that a gynecologist about 7 years ago attempted to remove it but was unable to do so. Leslie Molina does not use any other form of contraception.   She reports that her last normal BM was about 3 days ago. She has been NPO for the last 2 days. She passed gas today and, in fact, "had a little accident".  She did not have abdominal pain or any distension when she first arrived at [facility] but has developed both since this morning.   I reveiwed the CT with a radiologist here at [facility].  On exam she has moderate tympany of her abdomen, and tenderness to deep palpation.  Her Abdominal X ray shows a non-obstructive bowel gas pattern, no evidence of free air.  With regard to her malpositioned IUD, it is extremely unlikely that this is a source of any of her current problems although I doubt that it is very effective at contraception. The CT yesterday did not show any stranding around the protruding (small) bit of the tips or any other findings that would suggest that the IUD is an issue.  However, should general surgery end of taking her to the OR, the gynecologist on call for our service would most likely be able to attend the surgery. The IUD will most likely not be able to be removed via the laparoscope but may be able to be removed with a hysteroscopy.

## 2019-08-03 NOTE — Progress Notes (Signed)
Messaged Dr. Nolberto Hanlon regarding patients temp of 103, heart rate in the 130's and soft blood pressure. MD has ordered tylenol. Continue to monitor,

## 2019-08-03 NOTE — Consult Note (Signed)
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 Seen in ED on 2/27 and was febrile but dced off meds.  Now admit with sepsis, findings of large psoas abscesses, R sacroiliatis, bacteremia with MRSA. Also incidentally noted to have extension of IUD through the uterus.  She has psoriasis which is untreated and reports had an area of swelling on back of L leg 1-2 weeks ago.   Past Medical History:  Diagnosis Date  . Anxiety   . Bipolar 1 disorder ([place])   . Depression   . Migraine   . Nerve damage    Past Surgical History:  Procedure Laterality Date  . CESAREAN SECTION     Social History   Tobacco Use  . Smoking status: Current Every Day Smoker    Packs/day: 1.00    Types: Cigarettes  . Smokeless tobacco: Never Used  Substance Use Topics  . Alcohol use: No    Alcohol/week: 0.0 standard drinks  . Drug use: No   Family History  Problem Relation Age of Onset  . Depression Mother   . Bipolar disorder Mother   . Depression Father   . Bipolar disorder Father   . Depression Sister   . Bipolar disorder Sister     Allergies: No Known Allergies  Current antibiotics: Antibiotics Given (last 72 hours)    Date/Time Action Medication Dose Rate   08/02/19 1358 New Bag/Given   ceFEPIme (MAXIPIME) 2 g in sodium chloride 0.9 % 100 mL IVPB 2 g 200 mL/hr   08/02/19 1501 New Bag/Given   metroNIDAZOLE (FLAGYL) IVPB 500 mg 500 mg 100 mL/hr   08/02/19 1554 New Bag/Given   vancomycin (VANCOCIN) IVPB 1000 mg/200 mL premix 1,000 mg 200 mL/hr   08/02/19 2123 New Bag/Given   ceFEPIme (MAXIPIME) 2 g in sodium chloride 0.9 % 100  mL IVPB 2 g 200 mL/hr   08/03/19 0420 New Bag/Given   vancomycin (VANCOREADY) IVPB 750 mg/150 mL 750 mg 150 mL/hr   08/03/19 6294 New Bag/Given   ceFEPIme (MAXIPIME) 2 g in sodium chloride 0.9 % 100 mL IVPB 2 g 200 mL/hr   08/03/19 1211 New Bag/Given   DAPTOmycin (CUBICIN) 500 mg in sodium chloride 0.9 % IVPB 500 mg 220 mL/hr   08/03/19 1239 New Bag/Given   vancomycin (VANCOREADY) IVPB 750 mg/150 mL 750 mg 150 mL/hr      MEDICATIONS: . Chlorhexidine Gluconate Cloth  6 each Topical Daily  . enoxaparin (LOVENOX) injection  40 mg Subcutaneous Q24H    Review of Systems - 11 systems reviewed and negative per HPI   OBJECTIVE: Temp:  [98.3 F (36.8 C)-103.3 F (39.6 C)] 99.2 F (37.3 C) (03/04 1100) Pulse Rate:  [89-143] 118 (03/04 1100) Resp:  [18-49] 32 (03/04 1100) BP: (81-132)/(42-97) 81/57 (03/04 1100) SpO2:  [93 %-100 %] 96 % (03/04 1100) Physical Exam  Constitutional:  oriented to person, place, and time. Ill appearing, in pain  HENT: [place]/AT, PERRLA, no scleral icterus Mouth/Throat: Oropharynx is clear and moist. No oropharyngeal exudate.  Cardiovascular: Normal rate, regular rhythm and normal heart sounds.  2/ 6 sm  Pulmonary/Chest: Effort normal and breath sounds normal. No respiratory distress.  has no wheezes.  Neck = supple, no nuchal rigidity Abdominal: Soft. Bowel sounds are normal.  Mild distention, mild ttp Lymphadenopathy: no cervical adenopathy. No axillary adenopathy Neurological: alert and oriented to person, place, and time. MXK - R shoulder swelling and pain at sternoclavicular region. R hand swelling over dorsum with redness and pain with flex and extensiton  Skin: findings of psoriasis R hand dorsum swelling, redness Psychiatric: a normal mood and affect.  behavior is normal.    LABS: Results for orders placed or performed during the hospital encounter of 08/02/19 (from the past 48 hour(s))  Comprehensive metabolic panel     Status: Abnormal   Collection  Time: 08/02/19  8:43 AM  Result Value Ref Range   Sodium 128 (L) 135 - 145 mmol/L   Potassium 3.0 (L) 3.5 - 5.1 mmol/L   Chloride 89 (L) 98 - 111 mmol/L   CO2 24 22 - 32 mmol/L   Glucose, Bld 133 (H) 70 - 99 mg/dL    Comment: Glucose reference range applies only to samples taken after fasting for at least 8 hours.   BUN 11 6 - 20 mg/dL   Creatinine, Ser 0.60 0.44 - 1.00 mg/dL   Calcium 8.4 (L) 8.9 - 10.3 mg/dL   Total Protein 7.2 6.5 - 8.1 g/dL   Albumin 2.9 (L) 3.5 - 5.0 g/dL   AST 30 15 - 41 U/L   ALT 24 0 - 44 U/L   Alkaline Phosphatase 103 38 - 126 U/L   Total Bilirubin 0.5 0.3 - 1.2 mg/dL   GFR calc non Af Amer >60 >60 mL/min   GFR calc Af Amer >60 >60 mL/min   Anion gap 15 5 - 15    Comment: Performed at [facility], [address] [place], [place] [address]  CBC with Differential     Status: Abnormal   Collection Time: 08/02/19  8:43 AM  Result Value Ref Range   WBC 14.0 (H) 4.0 - 10.5 K/uL   RBC 4.11 3.87 - 5.11 MIL/uL   Hemoglobin 11.8 (L) 12.0 - 15.0 g/dL   HCT 33.7 (L) 36.0 - 46.0 %   MCV 82.0 80.0 - 100.0 fL   MCH 28.7 26.0 - 34.0 pg   MCHC 35.0 30.0 - 36.0 g/dL   RDW 13.6 11.5 - 15.5 %   Platelets 304 150 - 400 K/uL   nRBC 0.0 0.0 - 0.2 %   Neutrophils Relative % 88 %   Neutro Abs 12.3 (H) 1.7 - 7.7 K/uL   Lymphocytes Relative 5 %   Lymphs Abs 0.7 0.7 - 4.0 K/uL   Monocytes Relative 5 %   Monocytes Absolute 0.8 0.1 - 1.0 K/uL   Eosinophils Relative 0 %   Eosinophils Absolute 0.0 0.0 - 0.5 K/uL   Basophils Relative 1 %   Basophils Absolute 0.1 0.0 - 0.1 K/uL   WBC Morphology MORPHOLOGY UNREMARKABLE    RBC Morphology MORPHOLOGY UNREMARKABLE    Smear Review MORPHOLOGY UNREMARKABLE    Immature Granulocytes 1 %   Abs Immature Granulocytes 0.10 (H) 0.00 - 0.07 K/uL    Comment: Performed at [facility], [address] [place], [place] [address]  CK     Status: Abnormal   Collection Time: 08/02/19  8:43 AM  Result Value Ref Range    Total CK 410 (H) 38 - 234 U/L    Comment: Performed at  [facility], [address] [place], [place] [address]  Urinalysis, Complete w Microscopic     Status: Abnormal   Collection Time: 08/02/19  9:17 AM  Result Value Ref Range   Color, Urine YELLOW (A) YELLOW   APPearance CLEAR (A) CLEAR   Specific Gravity, Urine 1.008 1.005 - 1.030   pH 6.0 5.0 - 8.0   Glucose, UA NEGATIVE NEGATIVE mg/dL   Hgb urine dipstick NEGATIVE NEGATIVE   Bilirubin Urine NEGATIVE NEGATIVE   Ketones, ur NEGATIVE NEGATIVE mg/dL   Protein, ur NEGATIVE NEGATIVE mg/dL   Nitrite NEGATIVE NEGATIVE   Leukocytes,Ua NEGATIVE NEGATIVE   RBC / HPF 0-5 0 - 5 RBC/hpf   WBC, UA 0-5 0 - 5 WBC/hpf   Bacteria, UA RARE (A) NONE SEEN   Squamous Epithelial / LPF 0-5 0 - 5    Comment: Performed at [facility], [address], [place], [place] [address]  Urine Drug Screen, Qualitative ([facility] only)     Status: Abnormal   Collection Time: 08/02/19  9:17 AM  Result Value Ref Range   Tricyclic, Ur Screen POSITIVE (A) NONE DETECTED   Amphetamines, Ur Screen NONE DETECTED NONE DETECTED   MDMA (Ecstasy)Ur Screen NONE DETECTED NONE DETECTED   Cocaine Metabolite,Ur [place] NONE DETECTED NONE DETECTED   Opiate, Ur Screen NONE DETECTED NONE DETECTED   Phencyclidine (PCP) Ur S NONE DETECTED NONE DETECTED   Cannabinoid 50 Ng, Ur [place] NONE DETECTED NONE DETECTED   Barbiturates, Ur Screen NONE DETECTED NONE DETECTED   Benzodiazepine, Ur Scrn NONE DETECTED NONE DETECTED   Methadone Scn, Ur NONE DETECTED NONE DETECTED    Comment: (NOTE) Tricyclics + metabolites, urine    Cutoff 1000 ng/mL Amphetamines + metabolites, urine  Cutoff 1000 ng/mL MDMA (Ecstasy), urine              Cutoff 500 ng/mL Cocaine Metabolite, urine          Cutoff 300 ng/mL Opiate + metabolites, urine        Cutoff 300 ng/mL Phencyclidine (PCP), urine         Cutoff 25 ng/mL Cannabinoid, urine                 Cutoff 50 ng/mL Barbiturates + metabolites,  urine  Cutoff 200 ng/mL Benzodiazepine, urine              Cutoff 200 ng/mL Methadone, urine                   Cutoff 300 ng/mL The urine drug screen provides only a preliminary, unconfirmed analytical test result and should not be used for non-medical purposes. Clinical consideration and professional judgment should be applied to any positive drug screen result due to possible interfering substances. A more specific alternate chemical method must be used in order to obtain a confirmed analytical result. Gas chromatography / mass spectrometry (GC/MS) is the preferred confirmat ory method. Performed at [facility], [address] [place], [place] [address]   Pregnancy, urine POC     Status: None   Collection Time: 08/02/19  9:20 AM  Result Value Ref Range   Preg Test, Ur NEGATIVE NEGATIVE    Comment:        THE SENSITIVITY OF THIS METHODOLOGY IS >24 mIU/mL   Respiratory Panel by RT PCR (Flu A&B, Covid) - Nasopharyngeal Swab     Status: None   Collection Time: 08/02/19 11:20 AM   Specimen: Nasopharyngeal Swab  Result Value Ref  Range   SARS Coronavirus 2 by RT PCR NEGATIVE NEGATIVE    Comment: (NOTE) SARS-CoV-2 target nucleic acids are NOT DETECTED. The SARS-CoV-2 RNA is generally detectable in upper respiratoy specimens during the acute phase of infection. The lowest concentration of SARS-CoV-2 viral copies this assay can detect is 131 copies/mL. A negative result does not preclude SARS-Cov-2 infection and should not be used as the sole basis for treatment or other patient management decisions. A negative result may occur with  improper specimen collection/handling, submission of specimen other than nasopharyngeal swab, presence of viral mutation(s) within the areas targeted by this assay, and inadequate number of viral copies (<131 copies/mL). A negative result must be combined with clinical observations, patient history, and epidemiological information.  The expected result is Negative. Fact Sheet for Patients:  PinkCheek.be Fact Sheet for Healthcare Providers:  GravelBags.it This test is not yet ap proved or cleared by the [place] FDA and  has been authorized for detection and/or diagnosis of SARS-CoV-2 by FDA under an Emergency Use Authorization (EUA). This EUA will remain  in effect (meaning this test can be used) for the duration of the COVID-19 declaration under Section 564(b)(1) of the Act, 21 U.S.C. section 360bbb-3(b)(1), unless the authorization is terminated or revoked sooner.    Influenza A by PCR NEGATIVE NEGATIVE   Influenza B by PCR NEGATIVE NEGATIVE    Comment: (NOTE) The Xpert Xpress SARS-CoV-2/FLU/RSV assay is intended as an aid in  the diagnosis of influenza from Nasopharyngeal swab specimens and  should not be used as a sole basis for treatment. Nasal washings and  aspirates are unacceptable for Xpert Xpress SARS-CoV-2/FLU/RSV  testing. Fact Sheet for Patients: PinkCheek.be Fact Sheet for Healthcare Providers: GravelBags.it This test is not yet approved or cleared by the [place] FDA and  has been authorized for detection and/or diagnosis of SARS-CoV-2 by  FDA under an Emergency Use Authorization (EUA). This EUA will remain  in effect (meaning this test can be used) for the duration of the  Covid-19 declaration under Section 564(b)(1) of the Act, 21  U.S.C. section 360bbb-3(b)(1), unless the authorization is  terminated or revoked. Performed at [facility], [address] [place], [place] [address]   C-reactive protein     Status: Abnormal   Collection Time: 08/02/19 11:20 AM  Result Value Ref Range   CRP 39.0 (H) <1.0 mg/dL    Comment: Performed at [facility] [address] [place], [place] [address]  Blood culture (routine x 2)     Status: None (Preliminary  result)   Collection Time: 08/02/19 11:20 AM   Specimen: BLOOD  Result Value Ref Range   Specimen Description BLOOD RIGHT ANTECUBITAL    Special Requests      BOTTLES DRAWN AEROBIC AND ANAEROBIC Blood Culture adequate volume   Culture  Setup Time      IN BOTH AEROBIC AND ANAEROBIC BOTTLES GRAM POSITIVE COCCI CRITICAL RESULT CALLED TO, READ BACK BY AND VERIFIED WITH: SCOTT HALL @2022  08/02/19 [place] Performed at [facility], [address] [place], [place] [address]    Culture GRAM POSITIVE COCCI    Report Status PENDING   Blood culture (routine x 2)     Status: None (Preliminary result)   Collection Time: 08/02/19 11:20 AM   Specimen: BLOOD  Result Value Ref Range   Specimen Description      BLOOD BLOOD RIGHT ARM Performed at [facility], [address], [place], [place] [address]  Special Requests      BOTTLES DRAWN AEROBIC AND ANAEROBIC Blood Culture adequate volume Performed at [facility] [place], [place] [address]    Culture  Setup Time      IN BOTH AEROBIC AND ANAEROBIC BOTTLES GRAM POSITIVE COCCI Organism ID to follow CRITICAL RESULT CALLED TO, READ BACK BY AND VERIFIED WITH: SCOTT HALL @0019  08/03/19 [place] Performed at [facility], [place] [address], [place], [place] [address]    Culture GRAM POSITIVE COCCI    Report Status PENDING   Blood Culture ID Panel (Reflexed)     Status: Abnormal   Collection Time: 08/02/19 11:20 AM  Result Value Ref Range   Enterococcus species NOT DETECTED NOT DETECTED   Listeria monocytogenes NOT DETECTED NOT DETECTED   Staphylococcus species DETECTED (A) NOT DETECTED    Comment: CRITICAL RESULT CALLED TO, READ BACK BY AND VERIFIED WITH: SCOTT HALL @0019  08/03/19 [place]    Staphylococcus aureus (BCID) DETECTED (A) NOT DETECTED    Comment: Methicillin (oxacillin)-resistant Staphylococcus aureus (MRSA). MRSA is predictably resistant to beta-lactam antibiotics (except ceftaroline). Preferred  therapy is vancomycin unless clinically contraindicated. Patient requires contact precautions if  hospitalized. CRITICAL RESULT CALLED TO, READ BACK BY AND VERIFIED WITH: SCOTT HALL @0019  08/03/19 [place]    Methicillin resistance DETECTED (A) NOT DETECTED    Comment: CRITICAL RESULT CALLED TO, READ BACK BY AND VERIFIED WITH: SCOTT HALL @0019  08/03/19 [place]    Streptococcus species NOT DETECTED NOT DETECTED   Streptococcus agalactiae NOT DETECTED NOT DETECTED   Streptococcus pneumoniae NOT DETECTED NOT DETECTED   Streptococcus pyogenes NOT DETECTED NOT DETECTED   Acinetobacter baumannii NOT DETECTED NOT DETECTED   Enterobacteriaceae species NOT DETECTED NOT DETECTED   Enterobacter cloacae complex NOT DETECTED NOT DETECTED   Escherichia coli NOT DETECTED NOT DETECTED   Klebsiella oxytoca NOT DETECTED NOT DETECTED   Klebsiella pneumoniae NOT DETECTED NOT DETECTED   Proteus species NOT DETECTED NOT DETECTED   Serratia marcescens NOT DETECTED NOT DETECTED   Haemophilus influenzae NOT DETECTED NOT DETECTED   Neisseria meningitidis NOT DETECTED NOT DETECTED   Pseudomonas aeruginosa NOT DETECTED NOT DETECTED   Candida albicans NOT DETECTED NOT DETECTED   Candida glabrata NOT DETECTED NOT DETECTED   Candida krusei NOT DETECTED NOT DETECTED   Candida parapsilosis NOT DETECTED NOT DETECTED   Candida tropicalis NOT DETECTED NOT DETECTED    Comment: Performed at [facility], [address] [place], [place] [address]  Lactic acid, plasma     Status: None   Collection Time: 08/02/19 11:30 AM  Result Value Ref Range   Lactic Acid, Venous 1.4 0.5 - 1.9 mmol/L    Comment: Performed at [facility], [address] [place], [place] [address]  Sedimentation rate     Status: Abnormal   Collection Time: 08/02/19  3:50 PM  Result Value Ref Range   Sed Rate 101 (H) 0 - 20 mm/hr    Comment: Performed at [facility], [address] [place], [place] [address]  Aerobic/Anaerobic  Culture (surgical/deep wound)     Status: None (Preliminary result)   Collection Time: 08/02/19  5:29 PM   Specimen: Abscess  Result Value Ref Range   Specimen Description      ABSCESS Performed at [facility], [address] [place],  [address]    Special Requests ILIACUS MUSCLE    Gram Stain      MODERATE WBC PRESENT,BOTH PMN AND MONONUCLEAR ABUNDANT GRAM  POSITIVE COCCI IN PAIRS IN CLUSTERS Performed at [facility], [place] [address] [place], [place] [address]    Culture ABUNDANT STAPHYLOCOCCUS AUREUS    Report Status PENDING   MRSA PCR Screening     Status: Abnormal   Collection Time: 08/02/19  8:24 PM   Specimen: Nasal Mucosa; Nasopharyngeal  Result Value Ref Range   MRSA by PCR POSITIVE (A) NEGATIVE    Comment:        The GeneXpert MRSA Assay (FDA approved for NASAL specimens only), is one component of a comprehensive MRSA colonization surveillance program. It is not intended to diagnose MRSA infection nor to guide or monitor treatment for MRSA infections. RESULT CALLED TO, READ BACK BY AND VERIFIED WITH: Gypsy Lore 08/02/19 @ 2143  [place] Performed at [facility], [address] [place], [place] [address]   Glucose, capillary     Status: Abnormal   Collection Time: 08/02/19  9:13 PM  Result Value Ref Range   Glucose-Capillary 127 (H) 70 - 99 mg/dL    Comment: Glucose reference range applies only to samples taken after fasting for at least 8 hours.  HIV Antibody (routine testing w rflx)     Status: None   Collection Time: 08/03/19  2:50 AM  Result Value Ref Range   HIV Screen 4th Generation wRfx NON REACTIVE NON REACTIVE    Comment: Performed at [facility], [address], [place], [place] [address]  Protime-INR     Status: Abnormal   Collection Time: 08/03/19  2:50 AM  Result Value Ref Range   Prothrombin Time 16.2 (H) 11.4 - 15.2 seconds   INR 1.3 (H) 0.8 - 1.2    Comment: (NOTE) INR goal varies based on device and  disease states. Performed at [facility], [address] [place], [place] [address]   Procalcitonin     Status: None   Collection Time: 08/03/19  2:50 AM  Result Value Ref Range   Procalcitonin 5.99 ng/mL    Comment:        Interpretation: PCT > 2 ng/mL: Systemic infection (sepsis) is likely, unless other causes are known. (NOTE)       Sepsis PCT Algorithm           Lower Respiratory Tract                                      Infection PCT Algorithm    ----------------------------     ----------------------------         PCT < 0.25 ng/mL                PCT < 0.10 ng/mL         Strongly encourage             Strongly discourage   discontinuation of antibiotics    initiation of antibiotics    ----------------------------     -----------------------------       PCT 0.25 - 0.50 ng/mL            PCT 0.10 - 0.25 ng/mL               OR       >80% decrease in PCT            Discourage initiation of  antibiotics      Encourage discontinuation           of antibiotics    ----------------------------     -----------------------------         PCT >= 0.50 ng/mL              PCT 0.26 - 0.50 ng/mL               AND       <80% decrease in PCT              Encourage initiation of                                             antibiotics       Encourage continuation           of antibiotics    ----------------------------     -----------------------------        PCT >= 0.50 ng/mL                  PCT > 0.50 ng/mL               AND         increase in PCT                  Strongly encourage                                      initiation of antibiotics    Strongly encourage escalation           of antibiotics                                     -----------------------------                                           PCT <= 0.25 ng/mL                                                 OR                                        > 80% decrease in  PCT                                     Discontinue / Do not initiate                                             antibiotics Performed at [facility], [address], [place], [place] [address]   Comprehensive metabolic panel     Status: Abnormal   Collection Time: 08/03/19  2:50 AM  Result Value Ref Range   Sodium 134 (L) 135 - 145 mmol/L   Potassium 2.9 (L) 3.5 - 5.1 mmol/L   Chloride 104 98 - 111 mmol/L   CO2 25 22 - 32 mmol/L   Glucose, Bld 130 (H) 70 - 99 mg/dL    Comment: Glucose reference range applies only to samples taken after fasting for at least 8 hours.   BUN 15 6 - 20 mg/dL   Creatinine, Ser 0.50 0.44 - 1.00 mg/dL   Calcium 7.6 (L) 8.9 - 10.3 mg/dL   Total Protein 6.3 (L) 6.5 - 8.1 g/dL   Albumin 2.3 (L) 3.5 - 5.0 g/dL   AST 24 15 - 41 U/L   ALT 18 0 - 44 U/L   Alkaline Phosphatase 83 38 - 126 U/L   Total Bilirubin 0.5 0.3 - 1.2 mg/dL   GFR calc non Af Amer >60 >60 mL/min   GFR calc Af Amer >60 >60 mL/min   Anion gap 5 5 - 15    Comment: Performed at [facility], [address] [place], [place] [address]  Magnesium     Status: None   Collection Time: 08/03/19  2:50 AM  Result Value Ref Range   Magnesium 2.3 1.7 - 2.4 mg/dL    Comment: Performed at [facility], [address] [place], [place] [address]   No components found for: ESR, C REACTIVE PROTEIN MICRO: Recent Results (from the past 720 hour(s))  SARS CORONAVIRUS 2 (TAT 6-24 HRS) Nasopharyngeal Nasopharyngeal Swab     Status: None   Collection Time: 07/29/19 10:44 AM   Specimen: Nasopharyngeal Swab  Result Value Ref Range Status   SARS Coronavirus 2 NEGATIVE NEGATIVE Final    Comment: (NOTE) SARS-CoV-2 target nucleic acids are NOT DETECTED. The SARS-CoV-2 RNA is generally detectable in upper and lower respiratory specimens during the acute phase of infection. Negative results do not preclude SARS-CoV-2 infection, do not rule out co-infections with other pathogens,  and should not be used as the sole basis for treatment or other patient management decisions. Negative results must be combined with clinical observations, patient history, and epidemiological information. The expected result is Negative. Fact Sheet for Patients: SugarRoll.be Fact Sheet for Healthcare Providers: https://www.Perz-mathews.com/ This test is not yet approved or cleared by the [place] FDA and  has been authorized for detection and/or diagnosis of SARS-CoV-2 by FDA under an Emergency Use Authorization (EUA). This EUA will remain  in effect (meaning this test can be used) for the duration of the COVID-19 declaration under Section 56 4(b)(1) of the Act, 21 U.S.C. section 360bbb-3(b)(1), unless the authorization is terminated or revoked sooner. Performed at [facility], [place] [address] [place], [place] [address]   Respiratory Panel by RT PCR (Flu A&B, Covid) - Nasopharyngeal Swab     Status: None   Collection Time: 08/02/19 11:20 AM   Specimen: Nasopharyngeal Swab  Result Value Ref Range Status   SARS Coronavirus 2 by RT PCR NEGATIVE NEGATIVE Final    Comment: (NOTE) SARS-CoV-2 target nucleic acids are NOT DETECTED. The SARS-CoV-2 RNA is generally detectable in upper respiratoy specimens during the acute phase of infection. The lowest concentration of SARS-CoV-2 viral copies this assay can detect is 131 copies/mL. A negative result does not preclude SARS-Cov-2 infection and should not be used as the sole basis for treatment or other patient management decisions. A negative result may occur with  improper specimen collection/handling, submission of specimen other  than nasopharyngeal swab, presence of viral mutation(s) within the areas targeted by this assay, and inadequate number of viral copies (<131 copies/mL). A negative result must be combined with clinical observations, patient history, and epidemiological  information. The expected result is Negative. Fact Sheet for Patients:  PinkCheek.be Fact Sheet for Healthcare Providers:  GravelBags.it This test is not yet ap proved or cleared by the [place] FDA and  has been authorized for detection and/or diagnosis of SARS-CoV-2 by FDA under an Emergency Use Authorization (EUA). This EUA will remain  in effect (meaning this test can be used) for the duration of the COVID-19 declaration under Section 564(b)(1) of the Act, 21 U.S.C. section 360bbb-3(b)(1), unless the authorization is terminated or revoked sooner.    Influenza A by PCR NEGATIVE NEGATIVE Final   Influenza B by PCR NEGATIVE NEGATIVE Final    Comment: (NOTE) The Xpert Xpress SARS-CoV-2/FLU/RSV assay is intended as an aid in  the diagnosis of influenza from Nasopharyngeal swab specimens and  should not be used as a sole basis for treatment. Nasal washings and  aspirates are unacceptable for Xpert Xpress SARS-CoV-2/FLU/RSV  testing. Fact Sheet for Patients: PinkCheek.be Fact Sheet for Healthcare Providers: GravelBags.it This test is not yet approved or cleared by the [place] FDA and  has been authorized for detection and/or diagnosis of SARS-CoV-2 by  FDA under an Emergency Use Authorization (EUA). This EUA will remain  in effect (meaning this test can be used) for the duration of the  Covid-19 declaration under Section 564(b)(1) of the Act, 21  U.S.C. section 360bbb-3(b)(1), unless the authorization is  terminated or revoked. Performed at [facility], [address] [place], [place] [address]   Blood culture (routine x 2)     Status: None (Preliminary result)   Collection Time: 08/02/19 11:20 AM   Specimen: BLOOD  Result Value Ref Range Status   Specimen Description BLOOD RIGHT ANTECUBITAL  Final   Special Requests   Final    BOTTLES DRAWN  AEROBIC AND ANAEROBIC Blood Culture adequate volume   Culture  Setup Time   Final    IN BOTH AEROBIC AND ANAEROBIC BOTTLES GRAM POSITIVE COCCI CRITICAL RESULT CALLED TO, READ BACK BY AND VERIFIED WITH: Winston @2022  08/02/19 [place] Performed at [facility], [address], [place], [place] [address]    Culture GRAM POSITIVE COCCI  Final   Report Status PENDING  Incomplete  Blood culture (routine x 2)     Status: None (Preliminary result)   Collection Time: 08/02/19 11:20 AM   Specimen: BLOOD  Result Value Ref Range Status   Specimen Description   Final    BLOOD BLOOD RIGHT ARM Performed at [facility], [address], [place], [place] [address]    Special Requests   Final    BOTTLES DRAWN AEROBIC AND ANAEROBIC Blood Culture adequate volume Performed at [facility], [address] [place], [place] [address]    Culture  Setup Time   Final    IN BOTH AEROBIC AND ANAEROBIC BOTTLES GRAM POSITIVE COCCI Organism ID to follow CRITICAL RESULT CALLED TO, READ BACK BY AND VERIFIED WITH: SCOTT HALL @0019  08/03/19 [place] Performed at [facility], [address], [place], [place] [address]    Culture [facility] POSITIVE COCCI  Final   Report Status PENDING  Incomplete  Blood Culture ID Panel (Reflexed)     Status: Abnormal   Collection Time: 08/02/19 11:20 AM  Result Value Ref Range Status  Enterococcus species NOT DETECTED NOT DETECTED Final   Listeria monocytogenes NOT DETECTED NOT DETECTED Final   Staphylococcus species DETECTED (A) NOT DETECTED Final    Comment: CRITICAL RESULT CALLED TO, READ BACK BY AND VERIFIED WITH: SCOTT HALL @0019  08/03/19 [place]    Staphylococcus aureus (BCID) DETECTED (A) NOT DETECTED Final    Comment: Methicillin (oxacillin)-resistant Staphylococcus aureus (MRSA). MRSA is predictably resistant to beta-lactam antibiotics (except ceftaroline). Preferred therapy is vancomycin unless clinically contraindicated. Patient requires contact  precautions if  hospitalized. CRITICAL RESULT CALLED TO, READ BACK BY AND VERIFIED WITH: SCOTT HALL @0019  08/03/19 [place]    Methicillin resistance DETECTED (A) NOT DETECTED Final    Comment: CRITICAL RESULT CALLED TO, READ BACK BY AND VERIFIED WITH: SCOTT HALL @0019  08/03/19 [place]    Streptococcus species NOT DETECTED NOT DETECTED Final   Streptococcus agalactiae NOT DETECTED NOT DETECTED Final   Streptococcus pneumoniae NOT DETECTED NOT DETECTED Final   Streptococcus pyogenes NOT DETECTED NOT DETECTED Final   Acinetobacter baumannii NOT DETECTED NOT DETECTED Final   Enterobacteriaceae species NOT DETECTED NOT DETECTED Final   Enterobacter cloacae complex NOT DETECTED NOT DETECTED Final   Escherichia coli NOT DETECTED NOT DETECTED Final   Klebsiella oxytoca NOT DETECTED NOT DETECTED Final   Klebsiella pneumoniae NOT DETECTED NOT DETECTED Final   Proteus species NOT DETECTED NOT DETECTED Final   Serratia marcescens NOT DETECTED NOT DETECTED Final   Haemophilus influenzae NOT DETECTED NOT DETECTED Final   Neisseria meningitidis NOT DETECTED NOT DETECTED Final   Pseudomonas aeruginosa NOT DETECTED NOT DETECTED Final   Candida albicans NOT DETECTED NOT DETECTED Final   Candida glabrata NOT DETECTED NOT DETECTED Final   Candida krusei NOT DETECTED NOT DETECTED Final   Candida parapsilosis NOT DETECTED NOT DETECTED Final   Candida tropicalis NOT DETECTED NOT DETECTED Final    Comment: Performed at [facility], [address] [place], [place] [address]  Aerobic/Anaerobic Culture (surgical/deep wound)     Status: None (Preliminary result)   Collection Time: 08/02/19  5:29 PM   Specimen: Abscess  Result Value Ref Range Status   Specimen Description   Final    ABSCESS Performed at [facility], [address] [place], [place] [address]    Special Requests ILIACUS MUSCLE  Final   Gram Stain   Final    MODERATE WBC PRESENT,BOTH PMN AND MONONUCLEAR ABUNDANT GRAM POSITIVE  COCCI IN PAIRS IN CLUSTERS Performed at [facility], [address], [place], [place] [address]    Culture ABUNDANT STAPHYLOCOCCUS AUREUS  Final   Report Status PENDING  Incomplete  MRSA PCR Screening     Status: Abnormal   Collection Time: 08/02/19  8:24 PM   Specimen: Nasal Mucosa; Nasopharyngeal  Result Value Ref Range Status   MRSA by PCR POSITIVE (A) NEGATIVE Final    Comment:        The GeneXpert MRSA Assay (FDA approved for NASAL specimens only), is one component of a comprehensive MRSA colonization surveillance program. It is not intended to diagnose MRSA infection nor to guide or monitor treatment for MRSA infections. RESULT CALLED TO, READ BACK BY AND VERIFIED WITH: Gypsy Lore 08/02/19 @ 2143  [facility] Performed at [facility], [address] [place],  [address]     IMAGING: [place] Lumbar Spine 2-3 Views  Result Date: 08/02/2019 CLINICAL DATA:  Radicular back pain to the right lower extremity, no injury. EXAM: LUMBAR SPINE - 2-3 VIEW COMPARISON:  06/10/2013. FINDINGS: Very  mild levoconvex curvature of the lumbar spine may be positional. Alignment is otherwise anatomic. Vertebral body and disc space height are normal. Mild facet sclerosis in the lower lumbar spine. Intrauterine contraceptive device is incidentally noted. IMPRESSION: Mild facet sclerosis at L5-S1. Electronically Signed   By: Lorin Picket M.D.   On: 08/02/2019 09:59   CT Angio Chest PE W and/or Wo Contrast  Addendum Date: 08/03/2019   ADDENDUM REPORT: 08/03/2019 12:49 ADDENDUM: Not mentioned above: Thickening of the intercostal muscle between the right anterior first and second ribs with adjacent pleural reaction and soft tissue edema in the adjacent subcutaneous fat likely reflecting an inflammatory process given the patient's abnormalities along the right iliopsoas muscles and right sacroiliitis. No drainable fluid collection. These findings were discussed with Dr. Patsey Berthold. Electronically  Signed   By: Kathreen Devoid   On: 08/03/2019 12:49   Result Date: 08/03/2019 CLINICAL DATA:  Shortness of breath, severe back pain radiating down the right leg EXAM: CT ANGIOGRAPHY CHEST CT ABDOMEN AND PELVIS WITH CONTRAST TECHNIQUE: Multidetector CT imaging of the chest was performed using the standard protocol during bolus administration of intravenous contrast. Multiplanar CT image reconstructions and MIPs were obtained to evaluate the vascular anatomy. Multidetector CT imaging of the abdomen and pelvis was performed using the standard protocol during bolus administration of intravenous contrast. CONTRAST:  58m OMNIPAQUE IOHEXOL 350 MG/ML SOLN COMPARISON:  None. FINDINGS: CTA CHEST FINDINGS Cardiovascular: Satisfactory opacification of the pulmonary arteries to the segmental level. No evidence of pulmonary embolism. Normal heart size. No pericardial effusion. Mediastinum/Nodes: No enlarged mediastinal, hilar, or axillary lymph nodes. Thyroid gland, trachea, and esophagus demonstrate no significant findings. Lungs/Pleura: Lungs are clear. No pleural effusion or pneumothorax. Musculoskeletal: No chest wall abnormality. No acute or significant osseous findings. Review of the MIP images confirms the above findings. CT ABDOMEN and PELVIS FINDINGS Hepatobiliary: No focal liver abnormality is seen. No gallstones, gallbladder wall thickening, or biliary dilatation. Pancreas: Unremarkable. No pancreatic ductal dilatation or surrounding inflammatory changes. Spleen: Normal in size without focal abnormality. Adrenals/Urinary Tract: Adrenal glands are unremarkable. Kidneys are normal, without renal calculi, focal lesion, or hydronephrosis. Bladder is unremarkable. Stomach/Bowel: Stomach is within normal limits. Appendix appears normal. No evidence of bowel wall thickening, distention, or inflammatory changes. Vascular/Lymphatic: No significant vascular findings are present. No enlarged abdominal or pelvic lymph nodes.  Reproductive: No adnexal mass. T-type IUD within the uterus. A tip of 'T' has perforated through the uterine wall and projects into the peroneal cavity. Other: No abdominal wall hernia or abnormality. No abdominopelvic ascites. Musculoskeletal: No acute osseous abnormality. No aggressive osseous lesion. Vertebral body heights are maintained and are in normal anatomic alignment. No bone destruction or periosteal reaction. Mild osteoarthritis of bilateral SI joints. Heterogeneous enhancement and severe expansion of the right psoas and iliacus muscles with mild adjacent soft tissue edema. Review of the MIP images confirms the above findings. IMPRESSION: 1. Heterogeneous enhancement and severe expansion of the right psoas and iliacus muscles with mild adjacent soft tissue edema. Overall appearance is concerning for infectious myositis versus less likely muscle strain and intramuscular hemorrhage. Subtle small hypodensities within the iliopsoas muscle measuring up to 13 mm concerning for an intramuscular abscess. The lumbar spine demonstrates no focal abnormality to suggest discitis or osteomyelitis, but MRI of the lumbar spine without and with intravenous contrast is recommended for increased sensitivity if there is concern for discitis/osteomyelitis. 2. T-type IUD within the uterus. A tip of 'T' has perforated through the uterine wall and  projects into the peroneal cavity. 3. No acute cardiopulmonary disease. Electronically Signed: By: Kathreen Devoid On: 08/02/2019 12:09   CT GUIDED NEEDLE PLACEMENT  Result Date: 08/02/2019 INDICATION: History of intravenous drug abuse, now with right pelvic/retroperitoneal abscess. Please perform CT-guided aspiration and/or drainage catheter placement. EXAM: CT-GUIDED RIGHT ILIACUS MUSCULAR ABSCESS ASPIRATION COMPARISON:  CT abdomen pelvis-earlier same day; lumbar spine and right hip MRI-earlier same MEDICATIONS: None ANESTHESIA/SEDATION: Moderate (conscious) sedation was employed  during this procedure. A total of Versed 3 mg and Fentanyl 150 mcg was administered intravenously. Moderate Sedation Time: 34 minutes. The patient's level of consciousness and vital signs were monitored continuously by radiology nursing throughout the procedure under my direct supervision. CONTRAST:  None COMPLICATIONS: None immediate. PROCEDURE: Informed written consent was obtained from the patient after a discussion of the risks, benefits and alternatives to treatment. The patient was placed supine on the CT gantry and a pre procedural CT was performed re-demonstrating the known abscess/fluid collection within the right iliacus musculature with dominant ill-defined component measuring approximately 2.4 x 2.0 cm (image 60, series 2). The procedure was planned. A timeout was performed prior to the initiation of the procedure. The skin overlying the inferior anterior aspect of the right lower abdomen/pelvis was prepped and draped in the usual sterile fashion. The overlying soft tissues were anesthetized with 1% lidocaine with epinephrine. Appropriate trajectory was planned with the use of a 22 gauge spinal needle. An 18 gauge trocar needle was advanced into the abscess/fluid collection. Appropriate positioning was confirmed with a limited CT scan. Given the small size of the collection as well as lack of peripheral wall enhancement on preceding contrast-enhanced abdominal CT, the decision was made to pursue aspiration at this time. Next, approximately 3 cc of purulent appearing fluid was aspirated as the trocar needle was slowly retracted. All aspirated fluid was capped and sent to the laboratory for analysis. A dressing was applied. The patient tolerated the procedure well without immediate post procedural complication. IMPRESSION: Successful CT guided aspiration of approximately 3 cc of purulent appearing fluid from right iliacus intramuscular abscess, currently not amenable to image guided drainage catheter  placement. All aspirated fluid was capped and sent to the laboratory as requested by the ordering clinical team. Electronically Signed   By: Sandi Mariscal M.D.   On: 08/02/2019 17:53   MR LUMBAR SPINE WO CONTRAST  Result Date: 08/02/2019 CLINICAL DATA:  Low back pain with radiculopathy. No recent injury EXAM: MRI LUMBAR SPINE WITHOUT CONTRAST TECHNIQUE: Multiplanar, multisequence MR imaging of the lumbar spine was performed. No intravenous contrast was administered. COMPARISON:  CT 08/02/2019 FINDINGS: Technical note: Examination is slightly degraded by patient motion artifact. Patient was unable to tolerate any further imaging and no postcontrast sequences were able to be performed. Segmentation:  Standard. Alignment:  Physiologic. Vertebrae:  No fracture, evidence of discitis, or bone lesion. Conus medullaris and cauda equina: Conus extends to the L2 level. Conus and cauda equina appear normal. Paraspinal and other soft tissues: Expansion and extensive fluid and edema signal within the right psoas muscle as well as the visualized portion of the right iliacus muscle. Fluid collection within the medial aspect of the right iliacus muscle measures up to 2.2 x 1.3 cm in transaxial dimension. The craniocaudal extent of the collection is not entirely included within the field of view. Fluid signal is seen within both the medial iliacus and or distal psoas muscle at the edge of the field of view which appears to extend from the  right sacroiliac joint (series 7, image 36). There is patchy bone marrow edema within the right sacrum adjacent to the right SI joint. Visualized portion of the left psoas muscle is normal. The posterior paraspinal musculature is normal. The canal from the L4-5 level and caudally is slightly heterogeneous, which is felt to most likely be secondary to motion artifact and prominence of the epidural fat. No definite epidural fluid collection. Disc levels: T12-L1: No significant disc protrusion,  foraminal stenosis, or canal stenosis. L1-L2: No significant disc protrusion, foraminal stenosis, or canal stenosis. L2-L3: No significant disc protrusion, foraminal stenosis, or canal stenosis. L3-L4: No significant disc protrusion, foraminal stenosis, or canal stenosis. L4-L5: No significant disc protrusion, foraminal stenosis, or canal stenosis. L5-S1: No significant disc protrusion, foraminal stenosis, or canal stenosis. IMPRESSION: 1. Limited study due to patient motion artifact. Patient was unable to tolerate any postcontrast imaging. 2. 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 Please see dedicated MRI of the pelvis and right hip which more fully images the musculature for further detail. 3. Negative for osteomyelitis-discitis. 4. Slightly heterogeneous appearance of the distal lumbar canal below the L4-5 level on STIR sequences, which is favored to be secondary to a combination of prominent epidural fat and motion artifact. Postcontrast imaging would be helpful to exclude the presence of epidural extension of infection. 5. No significant disc disease. No evidence of nerve impingement. Electronically Signed   By: Davina Poke D.O.   On: 08/02/2019 13:40   CT ABDOMEN PELVIS W CONTRAST  Addendum Date: 08/03/2019   ADDENDUM REPORT: 08/03/2019 12:49 ADDENDUM: Not mentioned above: Thickening of the intercostal muscle between the right anterior first and second ribs with adjacent pleural reaction and soft tissue edema in the adjacent subcutaneous fat likely reflecting an inflammatory process given the patient's abnormalities along the right iliopsoas muscles and right sacroiliitis. No drainable fluid collection. These findings were discussed with Dr. Patsey Berthold. Electronically Signed   By: Kathreen Devoid   On: 08/03/2019 12:49   Result Date: 08/03/2019 CLINICAL DATA:  Shortness of breath,  severe back pain radiating down the right leg EXAM: CT ANGIOGRAPHY CHEST CT ABDOMEN AND PELVIS WITH CONTRAST TECHNIQUE: Multidetector CT imaging of the chest was performed using the standard protocol during bolus administration of intravenous contrast. Multiplanar CT image reconstructions and MIPs were obtained to evaluate the vascular anatomy. Multidetector CT imaging of the abdomen and pelvis was performed using the standard protocol during bolus administration of intravenous contrast. CONTRAST:  41m OMNIPAQUE IOHEXOL 350 MG/ML SOLN COMPARISON:  None. FINDINGS: CTA CHEST FINDINGS Cardiovascular: Satisfactory opacification of the pulmonary arteries to the segmental level. No evidence of pulmonary embolism. Normal heart size. No pericardial effusion. Mediastinum/Nodes: No enlarged mediastinal, hilar, or axillary lymph nodes. Thyroid gland, trachea, and esophagus demonstrate no significant findings. Lungs/Pleura: Lungs are clear. No pleural effusion or pneumothorax. Musculoskeletal: No chest wall abnormality. No acute or significant osseous findings. Review of the MIP images confirms the above findings. CT ABDOMEN and PELVIS FINDINGS Hepatobiliary: No focal liver abnormality is seen. No gallstones, gallbladder wall thickening, or biliary dilatation. Pancreas: Unremarkable. No pancreatic ductal dilatation or surrounding inflammatory changes. Spleen: Normal in size without focal abnormality. Adrenals/Urinary Tract: Adrenal glands are unremarkable. Kidneys are normal, without renal calculi, focal lesion, or hydronephrosis. Bladder is unremarkable. Stomach/Bowel: Stomach is within normal limits. Appendix appears normal. No evidence of bowel wall thickening, distention, or inflammatory changes. Vascular/Lymphatic: No significant vascular findings are present.  No enlarged abdominal or pelvic lymph nodes. Reproductive: No adnexal mass. T-type IUD within the uterus. A tip of 'T' has perforated through the uterine wall and  projects into the peroneal cavity. Other: No abdominal wall hernia or abnormality. No abdominopelvic ascites. Musculoskeletal: No acute osseous abnormality. No aggressive osseous lesion. Vertebral body heights are maintained and are in normal anatomic alignment. No bone destruction or periosteal reaction. Mild osteoarthritis of bilateral SI joints. Heterogeneous enhancement and severe expansion of the right psoas and iliacus muscles with mild adjacent soft tissue edema. Review of the MIP images confirms the above findings. IMPRESSION: 1. Heterogeneous enhancement and severe expansion of the right psoas and iliacus muscles with mild adjacent soft tissue edema. Overall appearance is concerning for infectious myositis versus less likely muscle strain and intramuscular hemorrhage. Subtle small hypodensities within the iliopsoas muscle measuring up to 13 mm concerning for an intramuscular abscess. The lumbar spine demonstrates no focal abnormality to suggest discitis or osteomyelitis, but MRI of the lumbar spine without and with intravenous contrast is recommended for increased sensitivity if there is concern for discitis/osteomyelitis. 2. T-type IUD within the uterus. A tip of 'T' has perforated through the uterine wall and projects into the peroneal cavity. 3. No acute cardiopulmonary disease. Electronically Signed: By: Kathreen Devoid On: 08/02/2019 12:09   MR HIP RIGHT WO CONTRAST  Result Date: 08/02/2019 CLINICAL DATA:  Severe back pain radiating down the right hip and leg. Fever. EXAM: MR OF THE RIGHT HIP WITHOUT CONTRAST TECHNIQUE: Multiplanar, multisequence MR imaging was performed. No intravenous contrast was administered. COMPARISON:  CT scan of the abdomen and pelvis dated 08/02/2019 FINDINGS: Bones: There is abnormal edema in the right side of the sacrum and in the right iliac bone with fluid in the right SI joint, likely representing osteomyelitis and sepsis of the right sacroiliac joint. Articular cartilage  and labrum Articular cartilage:  Normal. Labrum:  Normal. Joint or bursal effusion Joint effusion:  Trace joint effusion. Bursae: No bursitis. Muscles and tendons Muscles and tendons: Extensive right iliopsoas abscess extending from at least the L3 level to the insertion of the iliopsoas tendon on the lesser trochanter. Is extensive edema in the iliopsoas muscle consistent with myositis. There is also edema in 1 of the right hip abductor muscles with fluid along the fascia of the muscles in the anterior aspect of the proximal right thigh likely representing infectious fasciitis. There is also extension of abscess into the right gluteal muscles through the a posterior aspect of the right sacroiliac joint seen on images 1 and 2 of series 19. There is edema in the gluteus minimus, medius, and maximus muscles consistent with myositis. There is edema in the right piriformis muscle consistent with myositis seen on image 8 of series 19. Other findings Miscellaneous:   Distended urinary bladder. IMPRESSION: 1. Extensive right iliopsoas abscess extending from at least the L3-4 level to the insertion of the iliopsoas tendon on the lesser trochanter. 2. 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 3. Myositis of the right gluteus minimus, medius, and maximus and right piriformis muscles. 4. 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 5. Abscess extends through the posterior aspect of the right SI joint into the gluteal musculature. 6. Fluid along the fascial planes in the anterior aspect of the proximal right thigh likely representing infectious fasciitis. 7. MRI with contrast may better define the extent of the infection and provide a baseline for a serial evaluation. Electronically Signed   By:  Lorriane Shire M.D.   On: 08/02/2019 13:45   DG Chest Port 1 View  Result Date: 08/03/2019 CLINICAL DATA:  Status post central line placement. EXAM: PORTABLE CHEST 1 VIEW COMPARISON:  Single-view of the chest  06/10/2013. FINDINGS: Left IJ approach central venous catheter is in place with the tip projecting in the right atrium. Recommend 3 cm withdrawal. Elevation of the right hemidiaphragm is noted. There is mild basilar atelectasis. No pneumothorax or pleural effusion. Heart size is normal. IMPRESSION: Left IJ catheter tip projects in the right atrium. Recommend withdrawal of 3 cm. Negative for pneumothorax. No acute disease. Electronically Signed   By: Inge Rise M.D.   On: 08/03/2019 12:20   ECHOCARDIOGRAM LIMITED  Result Date: 08/03/2019    ECHOCARDIOGRAM LIMITED REPORT   Patient Name:   GENESIS NOVOSAD Date of Exam: 08/03/2019 Medical Rec #:  [id number]  Height:       61.0 in Accession #:    [id number] Weight:       145.0 lb Date of Birth:  11-04-1985  BSA:          1.647 m Patient Age:    33 years   BP:           87/42 mmHg Patient Gender: F          HR:           121 bpm. Exam Location:  [facility] Procedure: 2D Echo, Cardiac Doppler and Color Doppler Indications:     Endocarditis 138  History:         Patient has no prior history of Echocardiogram examinations.                  Anxiety, bipolar 1 disorder, migraine.  Sonographer:     Sherrie Sport RDCS (AE) Referring Phys:  [place] Diagnosing Phys: Bartholome Bill MD IMPRESSIONS  1. No evidence of vegetations. Somewhat tachycardic but able to see valves fairly well.  2. Left ventricular ejection fraction, by estimation, is 65 to 70%. The left ventricle has normal function. Left ventricular diastolic parameters were normal.  3. Right ventricular systolic function is normal. The right ventricular size is mildly enlarged. There is moderately elevated pulmonary artery systolic pressure.  4. Left atrial size was mildly dilated.  5. The mitral valve is grossly normal. Mild mitral valve regurgitation.  6. The aortic valve is grossly normal. Aortic valve regurgitation is trivial. Conclusion(s)/Recommendation(s): No evidence of valvular vegetations on this  transthoracic echocardiogram. Would recommend a transesophageal echocardiogram to exclude infective endocarditis if clinically indicated. FINDINGS  Left Ventricle: Left ventricular ejection fraction, by estimation, is 65 to 70%. The left ventricle has normal function. The left ventricular internal cavity size was normal in size. There is no left ventricular hypertrophy. Right Ventricle: The right ventricular size is mildly enlarged. No increase in right ventricular wall thickness. Right ventricular systolic function is normal. There is moderately elevated pulmonary artery systolic pressure. The tricuspid regurgitant velocity is 3.11 m/s, and with an assumed right atrial pressure of 10 mmHg, the estimated right ventricular systolic pressure is 38.1 mmHg. Left Atrium: Left atrial size was mildly dilated. Pericardium: There is no evidence of pericardial effusion. Mitral Valve: The mitral valve is grossly normal. Mild mitral valve regurgitation. There is no evidence of mitral valve vegetation. Tricuspid Valve: The tricuspid valve is grossly normal. Tricuspid valve regurgitation is mild. Aortic Valve: The aortic valve is grossly normal. Aortic valve regurgitation is trivial. There is no evidence of aortic  valve vegetation. Pulmonic Valve: The pulmonic valve was grossly normal. Pulmonic valve regurgitation is trivial. There is no evidence of pulmonic valve vegetation. Aorta: The aortic root is normal in size and structure.  LEFT VENTRICLE PLAX 2D LVIDd:         4.82 cm LVIDs:         2.60 cm LV PW:         1.11 cm LV IVS:        0.95 cm LVOT diam:     2.20 cm LVOT Area:     3.80 cm  LEFT ATRIUM         Index LA diam:    4.10 cm 2.49 cm/m                        PULMONIC VALVE AORTA                 PV Vmax:        1.00 m/s Ao Root diam: 2.60 cm PV Peak grad:   4.0 mmHg                       RVOT Peak grad: 8 mmHg  TRICUSPID VALVE TR Peak grad:   38.7 mmHg TR Vmax:        311.00 cm/s  SHUNTS Systemic Diam: 2.20 cm Bartholome Bill MD Electronically signed by Bartholome Bill MD Signature Date/Time: 08/03/2019/11:20:44 AM    Final     Assessment:   Taisha Pennebaker is a 34 y.o. 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  Incidental note of IUD perf noted on CT.  TTE neg for veg.  Started on vanco and cefepime. Clinda added. One dose of dapt given. Being transferred to [facility] for eval by ortho and Gyn. Recommendations Cont vanco, cefepime and clinda. Will need eval by ortho and gyn.  Will need imaging of R hand and R shoulder. Will need TEE.  Thank you very much for allowing me to participate in the care of this patient. Please call with questions.   Cheral Marker. Ola Spurr, MD

## 2019-08-03 NOTE — Progress Notes (Signed)
Pt being transported per [facility] to [facility].

## 2019-08-03 NOTE — Consult Note (Addendum)
Reason for Consult: Septic shock Referring Physician: Nolberto Hanlon, MD  Leslie Molina is an 34 y.o. female.  HPI: 34 year old current smoker with a history of bipolar disorder past IV drug abuse presented to [facility] with a complaint of lower back pain as well as pain in her right lower extremity.  She had been seen in the emergency room twice within a week.  Patient presented with a fever of 102 Fahrenheit and was tachycardic and acutely ill-appearing.  She was admitted to the stepdown unit for IV fluid resuscitation and antibiotic management after work-up revealed that she had extensive myositis/abscess of the iliopsoas muscle and myositis of the gluteus medius, maximus and minimus.  There was also she has osteomyelitis on the right side of the sacrum and the iliac bone.  There is an abscess that extends through the posterior aspect of the proximal right thigh.  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  She states that she has had this IUD in place for 7 years and has felt cramping pain as an "menstrual period" pain for approximately 1 month.  Patient appears acutely ill, we are asked to assist with her management of septic shock her blood pressure this morning was 84 systolic with a mean of 59, she is tachycardic to 130s and febrile.  She appears uncomfortable and though awake and alert is very terse with responses.  I have personally reviewed the laboratory data thus far.  She has MRSA in the nares.  She also has MRSA in the blood and aspirate of the abscess performed last night shows Staph aureus.  Past Medical History:  Diagnosis Date  . Anxiety   . Bipolar 1 disorder ([place])   . Depression   . Migraine   . Nerve damage     Past Surgical History:  Procedure Laterality Date  . CESAREAN SECTION      Family History  Problem Relation Age of Onset  . Depression Mother   . Bipolar disorder Mother   . Depression Father   . Bipolar  disorder Father   . Depression Sister   . Bipolar disorder Sister     Social History:  reports that she has been smoking cigarettes. She has been smoking about 1.00 pack per day. She has never used smokeless tobacco. She reports that she does not drink alcohol or use drugs.  Allergies: No Known Allergies  Medications: I have reviewed the patient's current medications.  Results for orders placed or performed during the hospital encounter of 08/02/19 (from the past 48 hour(s))  Comprehensive metabolic panel     Status: Abnormal   Collection Time: 08/02/19  8:43 AM  Result Value Ref Range   Sodium 128 (L) 135 - 145 mmol/L   Potassium 3.0 (L) 3.5 - 5.1 mmol/L   Chloride 89 (L) 98 - 111 mmol/L   CO2 24 22 - 32 mmol/L   Glucose, Bld 133 (H) 70 - 99 mg/dL    Comment: Glucose reference range applies only to samples taken after fasting for at least 8 hours.   BUN 11 6 - 20 mg/dL   Creatinine, Ser 0.60 0.44 - 1.00 mg/dL   Calcium 8.4 (L) 8.9 - 10.3 mg/dL   Total Protein 7.2 6.5 - 8.1 g/dL   Albumin 2.9 (L) 3.5 - 5.0 g/dL   AST 30 15 - 41 U/L   ALT 24 0 - 44 U/L   Alkaline Phosphatase 103  38 - 126 U/L   Total Bilirubin 0.5 0.3 - 1.2 mg/dL   GFR calc non Af Amer >60 >60 mL/min   GFR calc Af Amer >60 >60 mL/min   Anion gap 15 5 - 15    Comment: Performed at [facility], [address] [place], [place] [address]  CBC with Differential     Status: Abnormal   Collection Time: 08/02/19  8:43 AM  Result Value Ref Range   WBC 14.0 (H) 4.0 - 10.5 K/uL   RBC 4.11 3.87 - 5.11 MIL/uL   Hemoglobin 11.8 (L) 12.0 - 15.0 g/dL   HCT 33.7 (L) 36.0 - 46.0 %   MCV 82.0 80.0 - 100.0 fL   MCH 28.7 26.0 - 34.0 pg   MCHC 35.0 30.0 - 36.0 g/dL   RDW 13.6 11.5 - 15.5 %   Platelets 304 150 - 400 K/uL   nRBC 0.0 0.0 - 0.2 %   Neutrophils Relative % 88 %   Neutro Abs 12.3 (H) 1.7 - 7.7 K/uL   Lymphocytes Relative 5 %   Lymphs Abs 0.7 0.7 - 4.0 K/uL   Monocytes Relative 5 %   Monocytes  Absolute 0.8 0.1 - 1.0 K/uL   Eosinophils Relative 0 %   Eosinophils Absolute 0.0 0.0 - 0.5 K/uL   Basophils Relative 1 %   Basophils Absolute 0.1 0.0 - 0.1 K/uL   WBC Morphology MORPHOLOGY UNREMARKABLE    RBC Morphology MORPHOLOGY UNREMARKABLE    Smear Review MORPHOLOGY UNREMARKABLE    Immature Granulocytes 1 %   Abs Immature Granulocytes 0.10 (H) 0.00 - 0.07 K/uL    Comment: Performed at [facility], [address] [place], [place] [address]  CK     Status: Abnormal   Collection Time: 08/02/19  8:43 AM  Result Value Ref Range   Total CK 410 (H) 38 - 234 U/L    Comment: Performed at [facility], [address] [place], [place] [address]  Urinalysis, Complete w Microscopic     Status: Abnormal   Collection Time: 08/02/19  9:17 AM  Result Value Ref Range   Color, Urine YELLOW (A) YELLOW   APPearance CLEAR (A) CLEAR   Specific Gravity, Urine 1.008 1.005 - 1.030   pH 6.0 5.0 - 8.0   Glucose, UA NEGATIVE NEGATIVE mg/dL   Hgb urine dipstick NEGATIVE NEGATIVE   Bilirubin Urine NEGATIVE NEGATIVE   Ketones, ur NEGATIVE NEGATIVE mg/dL   Protein, ur NEGATIVE NEGATIVE mg/dL   Nitrite NEGATIVE NEGATIVE   Leukocytes,Ua NEGATIVE NEGATIVE   RBC / HPF 0-5 0 - 5 RBC/hpf   WBC, UA 0-5 0 - 5 WBC/hpf   Bacteria, UA RARE (A) NONE SEEN   Squamous Epithelial / LPF 0-5 0 - 5    Comment: Performed at [facility], [address] [place], [place] [address]  Urine Drug Screen, Qualitative ([facility] only)     Status: Abnormal   Collection Time: 08/02/19  9:17 AM  Result Value Ref Range   Tricyclic, Ur Screen POSITIVE (A) NONE DETECTED   Amphetamines, Ur Screen NONE DETECTED NONE DETECTED   MDMA (Ecstasy)Ur Screen NONE DETECTED NONE DETECTED   Cocaine Metabolite,Ur [place] NONE DETECTED NONE DETECTED   Opiate, Ur Screen NONE DETECTED NONE DETECTED   Phencyclidine (PCP) Ur S NONE DETECTED NONE DETECTED   Cannabinoid 50 Ng, Ur [place] NONE DETECTED NONE DETECTED   Barbiturates, Ur  Screen NONE DETECTED NONE DETECTED   Benzodiazepine, Ur Scrn NONE DETECTED  NONE DETECTED   Methadone Scn, Ur NONE DETECTED NONE DETECTED    Comment: (NOTE) Tricyclics + metabolites, urine    Cutoff 1000 ng/mL Amphetamines + metabolites, urine  Cutoff 1000 ng/mL MDMA (Ecstasy), urine              Cutoff 500 ng/mL Cocaine Metabolite, urine          Cutoff 300 ng/mL Opiate + metabolites, urine        Cutoff 300 ng/mL Phencyclidine (PCP), urine         Cutoff 25 ng/mL Cannabinoid, urine                 Cutoff 50 ng/mL Barbiturates + metabolites, urine  Cutoff 200 ng/mL Benzodiazepine, urine              Cutoff 200 ng/mL Methadone, urine                   Cutoff 300 ng/mL The urine drug screen provides only a preliminary, unconfirmed analytical test result and should not be used for non-medical purposes. Clinical consideration and professional judgment should be applied to any positive drug screen result due to possible interfering substances. A more specific alternate chemical method must be used in order to obtain a confirmed analytical result. Gas chromatography / mass spectrometry (GC/MS) is the preferred confirmat ory method. Performed at [facility], [address] [place], [place] [address]   Pregnancy, urine POC     Status: None   Collection Time: 08/02/19  9:20 AM  Result Value Ref Range   Preg Test, Ur NEGATIVE NEGATIVE    Comment:        THE SENSITIVITY OF THIS METHODOLOGY IS >24 mIU/mL   Respiratory Panel by RT PCR (Flu A&B, Covid) - Nasopharyngeal Swab     Status: None   Collection Time: 08/02/19 11:20 AM   Specimen: Nasopharyngeal Swab  Result Value Ref Range   SARS Coronavirus 2 by RT PCR NEGATIVE NEGATIVE    Comment: (NOTE) SARS-CoV-2 target nucleic acids are NOT DETECTED. The SARS-CoV-2 RNA is generally detectable in upper respiratoy specimens during the acute phase of infection. The lowest concentration of SARS-CoV-2 viral copies this assay can  detect is 131 copies/mL. A negative result does not preclude SARS-Cov-2 infection and should not be used as the sole basis for treatment or other patient management decisions. A negative result may occur with  improper specimen collection/handling, submission of specimen other than nasopharyngeal swab, presence of viral mutation(s) within the areas targeted by this assay, and inadequate number of viral copies (<131 copies/mL). A negative result must be combined with clinical observations, patient history, and epidemiological information. The expected result is Negative. Fact Sheet for Patients:  PinkCheek.be Fact Sheet for Healthcare Providers:  GravelBags.it This test is not yet ap proved or cleared by the [place] FDA and  has been authorized for detection and/or diagnosis of SARS-CoV-2 by FDA under an Emergency Use Authorization (EUA). This EUA will remain  in effect (meaning this test can be used) for the duration of the COVID-19 declaration under Section 564(b)(1) of the Act, 21 U.S.C. section 360bbb-3(b)(1), unless the authorization is terminated or revoked sooner.    Influenza A by PCR NEGATIVE NEGATIVE   Influenza B by PCR NEGATIVE NEGATIVE    Comment: (NOTE) The Xpert Xpress SARS-CoV-2/FLU/RSV assay is intended as an aid in  the diagnosis of influenza from Nasopharyngeal swab specimens and  should not be used as a sole basis  for treatment. Nasal washings and  aspirates are unacceptable for Xpert Xpress SARS-CoV-2/FLU/RSV  testing. Fact Sheet for Patients: PinkCheek.be Fact Sheet for Healthcare Providers: GravelBags.it This test is not yet approved or cleared by the [place] FDA and  has been authorized for detection and/or diagnosis of SARS-CoV-2 by  FDA under an Emergency Use Authorization (EUA). This EUA will remain  in effect (meaning this test  can be used) for the duration of the  Covid-19 declaration under Section 564(b)(1) of the Act, 21  U.S.C. section 360bbb-3(b)(1), unless the authorization is  terminated or revoked. Performed at [facility], [address] [place], [place] [address]   C-reactive protein     Status: Abnormal   Collection Time: 08/02/19 11:20 AM  Result Value Ref Range   CRP 39.0 (H) <1.0 mg/dL    Comment: Performed at [facility] [address] [place], [place] [address]  Blood culture (routine x 2)     Status: None (Preliminary result)   Collection Time: 08/02/19 11:20 AM   Specimen: BLOOD  Result Value Ref Range   Specimen Description BLOOD RIGHT ANTECUBITAL    Special Requests      BOTTLES DRAWN AEROBIC AND ANAEROBIC Blood Culture adequate volume   Culture  Setup Time      IN BOTH AEROBIC AND ANAEROBIC BOTTLES GRAM POSITIVE COCCI CRITICAL RESULT CALLED TO, READ BACK BY AND VERIFIED WITH: [place] @2022  08/02/19 [place] Performed at [facility], [address] [place], [place] [address]    Culture GRAM POSITIVE COCCI    Report Status PENDING   Blood culture (routine x 2)     Status: None (Preliminary result)   Collection Time: 08/02/19 11:20 AM   Specimen: BLOOD  Result Value Ref Range   Specimen Description      BLOOD BLOOD RIGHT ARM Performed at [facility], [address] [place], [place] [address]    Special Requests      BOTTLES DRAWN AEROBIC AND ANAEROBIC Blood Culture adequate volume Performed at [facility], [address] [place], [place] [address]    Culture  Setup Time      IN BOTH AEROBIC AND ANAEROBIC BOTTLES GRAM POSITIVE COCCI Organism ID to follow CRITICAL RESULT CALLED TO, READ BACK BY AND VERIFIED WITH: SCOTT HALL @0019  08/03/19 [place] Performed at [facility], [place] [address] [place],  [address]    Culture GRAM POSITIVE COCCI    Report Status PENDING   Blood Culture ID Panel (Reflexed)     Status:  Abnormal   Collection Time: 08/02/19 11:20 AM  Result Value Ref Range   Enterococcus species NOT DETECTED NOT DETECTED   Listeria monocytogenes NOT DETECTED NOT DETECTED   Staphylococcus species DETECTED (A) NOT DETECTED    Comment: CRITICAL RESULT CALLED TO, READ BACK BY AND VERIFIED WITH: SCOTT HALL @0019  08/03/19 AKT    Staphylococcus aureus (BCID) DETECTED (A) NOT DETECTED    Comment: Methicillin (oxacillin)-resistant Staphylococcus aureus (MRSA). MRSA is predictably resistant to beta-lactam antibiotics (except ceftaroline). Preferred therapy is vancomycin unless clinically contraindicated. Patient requires contact precautions if  hospitalized. CRITICAL RESULT CALLED TO, READ BACK BY AND VERIFIED WITH: SCOTT HALL @0019  08/03/19 AKT    Methicillin resistance DETECTED (A) NOT DETECTED    Comment: CRITICAL RESULT CALLED TO, READ BACK BY AND VERIFIED WITH: SCOTT HALL @0019  08/03/19 AKT    Streptococcus species NOT DETECTED NOT DETECTED   Streptococcus agalactiae NOT DETECTED NOT DETECTED  Streptococcus pneumoniae NOT DETECTED NOT DETECTED   Streptococcus pyogenes NOT DETECTED NOT DETECTED   Acinetobacter baumannii NOT DETECTED NOT DETECTED   Enterobacteriaceae species NOT DETECTED NOT DETECTED   Enterobacter cloacae complex NOT DETECTED NOT DETECTED   Escherichia coli NOT DETECTED NOT DETECTED   Klebsiella oxytoca NOT DETECTED NOT DETECTED   Klebsiella pneumoniae NOT DETECTED NOT DETECTED   Proteus species NOT DETECTED NOT DETECTED   Serratia marcescens NOT DETECTED NOT DETECTED   Haemophilus influenzae NOT DETECTED NOT DETECTED   Neisseria meningitidis NOT DETECTED NOT DETECTED   Pseudomonas aeruginosa NOT DETECTED NOT DETECTED   Candida albicans NOT DETECTED NOT DETECTED   Candida glabrata NOT DETECTED NOT DETECTED   Candida krusei NOT DETECTED NOT DETECTED   Candida parapsilosis NOT DETECTED NOT DETECTED   Candida tropicalis NOT DETECTED NOT DETECTED    Comment: Performed at  [facility], [address] [place], [place] [address]  Lactic acid, plasma     Status: None   Collection Time: 08/02/19 11:30 AM  Result Value Ref Range   Lactic Acid, Venous 1.4 0.5 - 1.9 mmol/L    Comment: Performed at [facility], [address] [place], [place] [address]  Sedimentation rate     Status: Abnormal   Collection Time: 08/02/19  3:50 PM  Result Value Ref Range   Sed Rate 101 (H) 0 - 20 mm/hr    Comment: Performed at [facility], [address] [place], [place] [address]  Aerobic/Anaerobic Culture (surgical/deep wound)     Status: None (Preliminary result)   Collection Time: 08/02/19  5:29 PM   Specimen: Abscess  Result Value Ref Range   Specimen Description      ABSCESS Performed at [facility], [address] [place], [place] [address]    Special Requests ILIACUS MUSCLE    Gram Stain      MODERATE WBC PRESENT,BOTH PMN AND MONONUCLEAR ABUNDANT GRAM POSITIVE COCCI IN PAIRS IN CLUSTERS Performed at [facility], [place] [address], [place], [place] [address]    Culture PENDING    Report Status PENDING   MRSA PCR Screening     Status: Abnormal   Collection Time: 08/02/19  8:24 PM   Specimen: Nasal Mucosa; Nasopharyngeal  Result Value Ref Range   MRSA by PCR POSITIVE (A) NEGATIVE    Comment:        The GeneXpert MRSA Assay (FDA approved for NASAL specimens only), is one component of a comprehensive MRSA colonization surveillance program. It is not intended to diagnose MRSA infection nor to guide or monitor treatment for MRSA infections. RESULT CALLED TO, READ BACK BY AND VERIFIED WITH: Gypsy Lore 08/02/19 @ 2143  [place] Performed at [facility], [address] [place], [place] [address]   Glucose, capillary     Status: Abnormal   Collection Time: 08/02/19  9:13 PM  Result Value Ref Range   Glucose-Capillary 127 (H) 70 - 99 mg/dL    Comment: Glucose reference range applies only to samples taken  after fasting for at least 8 hours.  Protime-INR     Status: Abnormal   Collection Time: 08/03/19  2:50 AM  Result Value Ref Range   Prothrombin Time 16.2 (H) 11.4 - 15.2 seconds   INR 1.3 (H) 0.8 - 1.2    Comment: (NOTE) INR goal varies based on device and disease states. Performed at [facility], [address] [place], [place] [address]   Procalcitonin  Status: None   Collection Time: 08/03/19  2:50 AM  Result Value Ref Range   Procalcitonin 5.99 ng/mL    Comment:        Interpretation: PCT > 2 ng/mL: Systemic infection (sepsis) is likely, unless other causes are known. (NOTE)       Sepsis PCT Algorithm           Lower Respiratory Tract                                      Infection PCT Algorithm    ----------------------------     ----------------------------         PCT < 0.25 ng/mL                PCT < 0.10 ng/mL         Strongly encourage             Strongly discourage   discontinuation of antibiotics    initiation of antibiotics    ----------------------------     -----------------------------       PCT 0.25 - 0.50 ng/mL            PCT 0.10 - 0.25 ng/mL               OR       >80% decrease in PCT            Discourage initiation of                                            antibiotics      Encourage discontinuation           of antibiotics    ----------------------------     -----------------------------         PCT >= 0.50 ng/mL              PCT 0.26 - 0.50 ng/mL               AND       <80% decrease in PCT              Encourage initiation of                                             antibiotics       Encourage continuation           of antibiotics    ----------------------------     -----------------------------        PCT >= 0.50 ng/mL                  PCT > 0.50 ng/mL               AND         increase in PCT                  Strongly encourage                                      initiation of antibiotics    Strongly encourage  escalation  of antibiotics                                     -----------------------------                                           PCT <= 0.25 ng/mL                                                 OR                                        > 80% decrease in PCT                                     Discontinue / Do not initiate                                             antibiotics Performed at [facility], [place], [address] [place], [place] [address]   Comprehensive metabolic panel     Status: Abnormal   Collection Time: 08/03/19  2:50 AM  Result Value Ref Range   Sodium 134 (L) 135 - 145 mmol/L   Potassium 2.9 (L) 3.5 - 5.1 mmol/L   Chloride 104 98 - 111 mmol/L   CO2 25 22 - 32 mmol/L   Glucose, Bld 130 (H) 70 - 99 mg/dL    Comment: Glucose reference range applies only to samples taken after fasting for at least 8 hours.   BUN 15 6 - 20 mg/dL   Creatinine, Ser 0.50 0.44 - 1.00 mg/dL   Calcium 7.6 (L) 8.9 - 10.3 mg/dL   Total Protein 6.3 (L) 6.5 - 8.1 g/dL   Albumin 2.3 (L) 3.5 - 5.0 g/dL   AST 24 15 - 41 U/L   ALT 18 0 - 44 U/L   Alkaline Phosphatase 83 38 - 126 U/L   Total Bilirubin 0.5 0.3 - 1.2 mg/dL   GFR calc non Af Amer >60 >60 mL/min   GFR calc Af Amer >60 >60 mL/min   Anion gap 5 5 - 15    Comment: Performed at [facility], [address] [place], [place] [address]  Magnesium     Status: None   Collection Time: 08/03/19  2:50 AM  Result Value Ref Range   Magnesium 2.3 1.7 - 2.4 mg/dL    Comment: Performed at [facility], [address] [place], [place] [address]    DG Lumbar Spine 2-3 Views  Result Date: 08/02/2019 CLINICAL DATA:  Radicular back pain to the right lower extremity, no injury. EXAM: LUMBAR SPINE - 2-3 VIEW COMPARISON:  06/10/2013. FINDINGS: Very mild levoconvex curvature of the lumbar spine may be positional. Alignment is otherwise anatomic. Vertebral body and disc space height are normal. Mild facet  sclerosis in the lower lumbar spine. Intrauterine contraceptive device  is incidentally noted. IMPRESSION: Mild facet sclerosis at L5-S1. Electronically Signed   By: Lorin Picket M.D.   On: 08/02/2019 09:59   CT Angio Chest PE W and/or Wo Contrast  Result Date: 08/02/2019 CLINICAL DATA:  Shortness of breath, severe back pain radiating down the right leg EXAM: CT ANGIOGRAPHY CHEST CT ABDOMEN AND PELVIS WITH CONTRAST TECHNIQUE: Multidetector CT imaging of the chest was performed using the standard protocol during bolus administration of intravenous contrast. Multiplanar CT image reconstructions and MIPs were obtained to evaluate the vascular anatomy. Multidetector CT imaging of the abdomen and pelvis was performed using the standard protocol during bolus administration of intravenous contrast. CONTRAST:  66mL OMNIPAQUE IOHEXOL 350 MG/ML SOLN COMPARISON:  None. FINDINGS: CTA CHEST FINDINGS Cardiovascular: Satisfactory opacification of the pulmonary arteries to the segmental level. No evidence of pulmonary embolism. Normal heart size. No pericardial effusion. Mediastinum/Nodes: No enlarged mediastinal, hilar, or axillary lymph nodes. Thyroid gland, trachea, and esophagus demonstrate no significant findings. Lungs/Pleura: Lungs are clear. No pleural effusion or pneumothorax. Musculoskeletal: No chest wall abnormality. No acute or significant osseous findings. Review of the MIP images confirms the above findings. CT ABDOMEN and PELVIS FINDINGS Hepatobiliary: No focal liver abnormality is seen. No gallstones, gallbladder wall thickening, or biliary dilatation. Pancreas: Unremarkable. No pancreatic ductal dilatation or surrounding inflammatory changes. Spleen: Normal in size without focal abnormality. Adrenals/Urinary Tract: Adrenal glands are unremarkable. Kidneys are normal, without renal calculi, focal lesion, or hydronephrosis. Bladder is unremarkable. Stomach/Bowel: Stomach is within normal limits. Appendix  appears normal. No evidence of bowel wall thickening, distention, or inflammatory changes. Vascular/Lymphatic: No significant vascular findings are present. No enlarged abdominal or pelvic lymph nodes. Reproductive: No adnexal mass. T-type IUD within the uterus. A tip of 'T' has perforated through the uterine wall and projects into the peroneal cavity. Other: No abdominal wall hernia or abnormality. No abdominopelvic ascites. Musculoskeletal: No acute osseous abnormality. No aggressive osseous lesion. Vertebral body heights are maintained and are in normal anatomic alignment. No bone destruction or periosteal reaction. Mild osteoarthritis of bilateral SI joints. Heterogeneous enhancement and severe expansion of the right psoas and iliacus muscles with mild adjacent soft tissue edema. Review of the MIP images confirms the above findings. IMPRESSION: 1. Heterogeneous enhancement and severe expansion of the right psoas and iliacus muscles with mild adjacent soft tissue edema. Overall appearance is concerning for infectious myositis versus less likely muscle strain and intramuscular hemorrhage. Subtle small hypodensities within the iliopsoas muscle measuring up to 13 mm concerning for an intramuscular abscess. The lumbar spine demonstrates no focal abnormality to suggest discitis or osteomyelitis, but MRI of the lumbar spine without and with intravenous contrast is recommended for increased sensitivity if there is concern for discitis/osteomyelitis. 2. T-type IUD within the uterus. A tip of 'T' has perforated through the uterine wall and projects into the peroneal cavity. 3. No acute cardiopulmonary disease. Electronically Signed   By: Kathreen Devoid   On: 08/02/2019 12:09   CT GUIDED NEEDLE PLACEMENT  Result Date: 08/02/2019 INDICATION: History of intravenous drug abuse, now with right pelvic/retroperitoneal abscess. Please perform CT-guided aspiration and/or drainage catheter placement. EXAM: CT-GUIDED RIGHT ILIACUS  MUSCULAR ABSCESS ASPIRATION COMPARISON:  CT abdomen pelvis-earlier same day; lumbar spine and right hip MRI-earlier same MEDICATIONS: None ANESTHESIA/SEDATION: Moderate (conscious) sedation was employed during this procedure. A total of Versed 3 mg and Fentanyl 150 mcg was administered intravenously. Moderate Sedation Time: 34 minutes. The patient's level of consciousness and vital signs were monitored continuously by radiology  nursing throughout the procedure under my direct supervision. CONTRAST:  None COMPLICATIONS: None immediate. PROCEDURE: Informed written consent was obtained from the patient after a discussion of the risks, benefits and alternatives to treatment. The patient was placed supine on the CT gantry and a pre procedural CT was performed re-demonstrating the known abscess/fluid collection within the right iliacus musculature with dominant ill-defined component measuring approximately 2.4 x 2.0 cm (image 60, series 2). The procedure was planned. A timeout was performed prior to the initiation of the procedure. The skin overlying the inferior anterior aspect of the right lower abdomen/pelvis was prepped and draped in the usual sterile fashion. The overlying soft tissues were anesthetized with 1% lidocaine with epinephrine. Appropriate trajectory was planned with the use of a 22 gauge spinal needle. An 18 gauge trocar needle was advanced into the abscess/fluid collection. Appropriate positioning was confirmed with a limited CT scan. Given the small size of the collection as well as lack of peripheral wall enhancement on preceding contrast-enhanced abdominal CT, the decision was made to pursue aspiration at this time. Next, approximately 3 cc of purulent appearing fluid was aspirated as the trocar needle was slowly retracted. All aspirated fluid was capped and sent to the laboratory for analysis. A dressing was applied. The patient tolerated the procedure well without immediate post procedural  complication. IMPRESSION: Successful CT guided aspiration of approximately 3 cc of purulent appearing fluid from right iliacus intramuscular abscess, currently not amenable to image guided drainage catheter placement. All aspirated fluid was capped and sent to the laboratory as requested by the ordering clinical team. Electronically Signed   By: Sandi Mariscal M.D.   On: 08/02/2019 17:53   MR LUMBAR SPINE WO CONTRAST  Result Date: 08/02/2019 CLINICAL DATA:  Low back pain with radiculopathy. No recent injury EXAM: MRI LUMBAR SPINE WITHOUT CONTRAST TECHNIQUE: Multiplanar, multisequence MR imaging of the lumbar spine was performed. No intravenous contrast was administered. COMPARISON:  CT 08/02/2019 FINDINGS: Technical note: Examination is slightly degraded by patient motion artifact. Patient was unable to tolerate any further imaging and no postcontrast sequences were able to be performed. Segmentation:  Standard. Alignment:  Physiologic. Vertebrae:  No fracture, evidence of discitis, or bone lesion. Conus medullaris and cauda equina: Conus extends to the L2 level. Conus and cauda equina appear normal. Paraspinal and other soft tissues: Expansion and extensive fluid and edema signal within the right psoas muscle as well as the visualized portion of the right iliacus muscle. Fluid collection within the medial aspect of the right iliacus muscle measures up to 2.2 x 1.3 cm in transaxial dimension. The craniocaudal extent of the collection is not entirely included within the field of view. Fluid signal is seen within both the medial iliacus and or distal psoas muscle at the edge of the field of view which appears to extend from the right sacroiliac joint (series 7, image 36). There is patchy bone marrow edema within the right sacrum adjacent to the right SI joint. Visualized portion of the left psoas muscle is normal. The posterior paraspinal musculature is normal. The canal from the L4-5 level and caudally is slightly  heterogeneous, which is felt to most likely be secondary to motion artifact and prominence of the epidural fat. No definite epidural fluid collection. Disc levels: T12-L1: No significant disc protrusion, foraminal stenosis, or canal stenosis. L1-L2: No significant disc protrusion, foraminal stenosis, or canal stenosis. L2-L3: No significant disc protrusion, foraminal stenosis, or canal stenosis. L3-L4: No significant disc protrusion, foraminal stenosis, or  canal stenosis. L4-L5: No significant disc protrusion, foraminal stenosis, or canal stenosis. L5-S1: No significant disc protrusion, foraminal stenosis, or canal stenosis. IMPRESSION: 1. Limited study due to patient motion artifact. Patient was unable to tolerate any postcontrast imaging. 2. 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 Please see dedicated MRI of the pelvis and right hip which more fully images the musculature for further detail. 3. Negative for osteomyelitis-discitis. 4. Slightly heterogeneous appearance of the distal lumbar canal below the L4-5 level on STIR sequences, which is favored to be secondary to a combination of prominent epidural fat and motion artifact. Postcontrast imaging would be helpful to exclude the presence of epidural extension of infection. 5. No significant disc disease. No evidence of nerve impingement. Electronically Signed   By: Davina Poke D.O.   On: 08/02/2019 13:40   CT ABDOMEN PELVIS W CONTRAST  Result Date: 08/02/2019 CLINICAL DATA:  Shortness of breath, severe back pain radiating down the right leg EXAM: CT ANGIOGRAPHY CHEST CT ABDOMEN AND PELVIS WITH CONTRAST TECHNIQUE: Multidetector CT imaging of the chest was performed using the standard protocol during bolus administration of intravenous contrast. Multiplanar CT image reconstructions and MIPs were obtained to evaluate the vascular  anatomy. Multidetector CT imaging of the abdomen and pelvis was performed using the standard protocol during bolus administration of intravenous contrast. CONTRAST:  65mL OMNIPAQUE IOHEXOL 350 MG/ML SOLN COMPARISON:  None. FINDINGS: CTA CHEST FINDINGS Cardiovascular: Satisfactory opacification of the pulmonary arteries to the segmental level. No evidence of pulmonary embolism. Normal heart size. No pericardial effusion. Mediastinum/Nodes: No enlarged mediastinal, hilar, or axillary lymph nodes. Thyroid gland, trachea, and esophagus demonstrate no significant findings. Lungs/Pleura: Lungs are clear. No pleural effusion or pneumothorax. Musculoskeletal: No chest wall abnormality. No acute or significant osseous findings. Review of the MIP images confirms the above findings. CT ABDOMEN and PELVIS FINDINGS Hepatobiliary: No focal liver abnormality is seen. No gallstones, gallbladder wall thickening, or biliary dilatation. Pancreas: Unremarkable. No pancreatic ductal dilatation or surrounding inflammatory changes. Spleen: Normal in size without focal abnormality. Adrenals/Urinary Tract: Adrenal glands are unremarkable. Kidneys are normal, without renal calculi, focal lesion, or hydronephrosis. Bladder is unremarkable. Stomach/Bowel: Stomach is within normal limits. Appendix appears normal. No evidence of bowel wall thickening, distention, or inflammatory changes. Vascular/Lymphatic: No significant vascular findings are present. No enlarged abdominal or pelvic lymph nodes. Reproductive: No adnexal mass. T-type IUD within the uterus. A tip of 'T' has perforated through the uterine wall and projects into the peroneal cavity. Other: No abdominal wall hernia or abnormality. No abdominopelvic ascites. Musculoskeletal: No acute osseous abnormality. No aggressive osseous lesion. Vertebral body heights are maintained and are in normal anatomic alignment. No bone destruction or periosteal reaction. Mild osteoarthritis of bilateral  SI joints. Heterogeneous enhancement and severe expansion of the right psoas and iliacus muscles with mild adjacent soft tissue edema. Review of the MIP images confirms the above findings. IMPRESSION: 1. Heterogeneous enhancement and severe expansion of the right psoas and iliacus muscles with mild adjacent soft tissue edema. Overall appearance is concerning for infectious myositis versus less likely muscle strain and intramuscular hemorrhage. Subtle small hypodensities within the iliopsoas muscle measuring up to 13 mm concerning for an intramuscular abscess. The lumbar spine demonstrates no focal abnormality to suggest discitis or osteomyelitis, but MRI of the lumbar spine without and with intravenous contrast is recommended for increased sensitivity if there is concern for discitis/osteomyelitis. 2. T-type IUD within the uterus.  A tip of 'T' has perforated through the uterine wall and projects into the peroneal cavity. 3. No acute cardiopulmonary disease. Electronically Signed   By: Kathreen Devoid   On: 08/02/2019 12:09   MR HIP RIGHT WO CONTRAST  Result Date: 08/02/2019 CLINICAL DATA:  Severe back pain radiating down the right hip and leg. Fever. EXAM: MR OF THE RIGHT HIP WITHOUT CONTRAST TECHNIQUE: Multiplanar, multisequence MR imaging was performed. No intravenous contrast was administered. COMPARISON:  CT scan of the abdomen and pelvis dated 08/02/2019 FINDINGS: Bones: There is abnormal edema in the right side of the sacrum and in the right iliac bone with fluid in the right SI joint, likely representing osteomyelitis and sepsis of the right sacroiliac joint. Articular cartilage and labrum Articular cartilage:  Normal. Labrum:  Normal. Joint or bursal effusion Joint effusion:  Trace joint effusion. Bursae: No bursitis. Muscles and tendons Muscles and tendons: Extensive right iliopsoas abscess extending from at least the L3 level to the insertion of the iliopsoas tendon on the lesser trochanter. Is extensive  edema in the iliopsoas muscle consistent with myositis. There is also edema in 1 of the right hip abductor muscles with fluid along the fascia of the muscles in the anterior aspect of the proximal right thigh likely representing infectious fasciitis. There is also extension of abscess into the right gluteal muscles through the a posterior aspect of the right sacroiliac joint seen on images 1 and 2 of series 19. There is edema in the gluteus minimus, medius, and maximus muscles consistent with myositis. There is edema in the right piriformis muscle consistent with myositis seen on image 8 of series 19. Other findings Miscellaneous:   Distended urinary bladder. IMPRESSION: 1. Extensive right iliopsoas abscess extending from at least the L3-4 level to the insertion of the iliopsoas tendon on the lesser trochanter. 2. 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 3. Myositis of the right gluteus minimus, medius, and maximus and right piriformis muscles. 4. 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 5. Abscess extends through the posterior aspect of the right SI joint into the gluteal musculature. 6. Fluid along the fascial planes in the anterior aspect of the proximal right thigh likely representing infectious fasciitis. 7. MRI with contrast may better define the extent of the infection and provide a baseline for a serial evaluation. Electronically Signed   By: Lorriane Shire M.D.   On: 08/02/2019 13:45    Review of Systems  Unable to perform ROS: Acuity of condition   Limited review of systems except for as noted above due to patient's acute illness.  Blood pressure (!) 87/42, pulse (!) 121, temperature (!) 102.4 F (39.1 C), temperature source Oral, resp. rate (!) 24, height 5\' 1"  (1.549 m), weight 65.8 kg, SpO2 97 %. Physical Exam GENERAL: Acutely/critically ill appearing woman in no respiratory distress, very uncomfortable due to pain. HEAD: Normocephalic, atraumatic.  EYES: Pupils  equal, round, reactive to light.  No scleral icterus.  MOUTH: Oral mucosa dry, no thrush.  No oral lesions. NECK: Supple. No thyromegaly. No nodules.  Neck veins flat, trachea midline. PULMONARY: Lungs: No adventitious sounds, symmetrical air entry. CARDIOVASCULAR: Tachycardic grade 2/6 systolic ejection murmur left sternal border. GASTROINTESTINAL: Abdomen is soft, there is referred tenderness in the lower quadrants, some guarding no rebound.  Diminished bowel sounds. MUSCULOSKELETAL: No joint deformity, no clubbing, no edema.  Inflamed and erythematous first and second MCP joint of right hand. NEUROLOGIC: Appears nonfocal.  She is  awake and alert.  Oriented.  Speech is fluent. SKIN: Erythema developing on the right neck.  Erythema over the first and second MCP joint of the right hand.  Multiple tattoos. PSYCH: Anxious, uncomfortable.  Images were reviewed independently.  Both CTs and MRI.  On the chest CT there is concern for a right pleural-based mass in the upper lung zones (discussing with radiology, not mentioned on the report.  No other abnormalities on the chest.  CT scan of the abdomen and pelvis shows a T type IUD which has perforated the uterine wall and is extending into the perineal space.  MRI shows extensive right iliopsoas abscess extending from the L3 level to the insertion of the iliopsoas tendon into the lesser trochanter.  Extensive edema of the iliopsoas muscle consistent with myositis.  Edema of the right hip abductor muscles.  There is extension of the abscess into the right gluteal muscles and there is evidence of infectious right sacroiliitis.  Extensive myositis and edema on the piriformis muscles as well.  There is osteomyelitis of the right side of the sacrum and iliac bones.   Assessment/Plan:  Staphylococcal toxic shock syndrome MRSA bacteremia Hypovolemia Central access Aggressive volume resuscitation Vasopressors to maintain MAP > 65 Continue antibiotic coverage:  Cefepime, vancomycin, clindamycin (discontinue Flagyl) Check IgG levels Trend procalcitonin/WBC 2D echo preliminary no valvular vegetations noted Blood cultures and aspirate of iliopsoas abscess: Staph aureus, methicillin resistant Recommend transfer to [facility] given multiple infectious sources as below Check cortisol level  Infectious disease issues: Right upper lung zone pleural-based/intercostal density could be infectious warrants follow-up Uterine wall perforation by IUD extending into the perineal space Right iliopsoas abscess with extensive involvement as noted above 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 Query early infection first and second MCP joints Patient will need appropriate surgical/GYN and orthopedic consultations Continue antibiotics as above Continue supportive care    Tachycardia Heart murmur May need TEE 2D echo shows no vegetation Mild mitral valve regurgitation Mild tricuspid regurgitation 65 to 70% Moderately elevated pulmonary artery systolic pressure  Hypokalemia Repleting Magnesium level 2.3  Past IV drug use Drugs screen was was negative for drugs of abuse Patient states she has been "clean" for 1 year  Best practice: DVT prophylaxis: Enoxaparin GI prophylaxis as appropriate   Critical care time: 75 minutes excluding procedure time.    Renold Don, MD [facility] 08/03/2019, 10:08 AM    *This note was dictated using voice recognition software/Dragon.  Despite best efforts to proofread, errors can occur which can change the meaning.  Any change was purely unintentional.

## 2019-08-03 NOTE — Consult Note (Signed)
CC: right hip pain  Requesting provider: Dr Elsworth Soho  HPI: Leslie Molina is an 34 y.o. female who is here for MRSA bacteremia, osteomyelitis of sacrum, right iliopsoas abscess with a questionable perforated uterus from IUD and abdominal pain.  She has a history of IV drug use but denies any recent drug use for the past year, bipolar 1, depression anxiety he states about a week ago she started having some right hip pain and discomfort as well as numbness.  She was seen in the emergency room and discharged but represented back with worsening right hip and right lower back and right lower extremity pain.  She was febrile.  She is tachycardic.  She had an elevated leukocytosis of 14,000.  Admission CT was concerning for infectious myositis.  There is concern of a perforated uterus due to an IUD and she was transferred to [facility] for further evaluation as well as orthopedic consultation.  Has had a prior C-section  No nausea or vomiting.  Had a loose bowel movement this morning.  Still having flatus.  Past Medical History:  Diagnosis Date  . Anxiety   . Bipolar 1 disorder ([place])   . Depression   . Migraine   . Nerve damage     Past Surgical History:  Procedure Laterality Date  . CESAREAN SECTION      Family History  Problem Relation Age of Onset  . Depression Mother   . Bipolar disorder Mother   . Depression Father   . Bipolar disorder Father   . Depression Sister   . Bipolar disorder Sister     Social:  reports that she has been smoking cigarettes. She has been smoking about 1.00 pack per day. She has never used smokeless tobacco. She reports that she does not drink alcohol or use drugs.  Allergies: No Known Allergies  Medications: I have reviewed the patient's current medications.  Results for orders placed or performed during the hospital encounter of 08/03/19 (from the past 48 hour(s))  Basic metabolic panel     Status: Abnormal   Collection Time: 08/03/19  6:47 PM  Result  Value Ref Range   Sodium 134 (L) 135 - 145 mmol/L   Potassium 2.9 (L) 3.5 - 5.1 mmol/L   Chloride 102 98 - 111 mmol/L   CO2 22 22 - 32 mmol/L   Glucose, Bld 121 (H) 70 - 99 mg/dL    Comment: Glucose reference range applies only to samples taken after fasting for at least 8 hours.   BUN 6 6 - 20 mg/dL   Creatinine, Ser 0.55 0.44 - 1.00 mg/dL   Calcium 7.6 (L) 8.9 - 10.3 mg/dL   GFR calc non Af Amer >60 >60 mL/min   GFR calc Af Amer >60 >60 mL/min   Anion gap 10 5 - 15    Comment: Performed at [facility] [address], [place], [place] [address]  Lactic acid, plasma     Status: None   Collection Time: 08/03/19  6:47 PM  Result Value Ref Range   Lactic Acid, Venous 0.8 0.5 - 1.9 mmol/L    Comment: Performed at [facility] [address], [place], [place] [address]  Magnesium     Status: None   Collection Time: 08/03/19  6:47 PM  Result Value Ref Range   Magnesium 1.9 1.7 - 2.4 mg/dL    Comment: Performed at [facility], [place] [address], [place], [place] [address]  Phosphorus  Status: Abnormal   Collection Time: 08/03/19  6:47 PM  Result Value Ref Range   Phosphorus 2.2 (L) 2.5 - 4.6 mg/dL    Comment: Performed at [facility] [address], [place], [place] [address]  CBC WITH DIFFERENTIAL     Status: Abnormal   Collection Time: 08/03/19  6:47 PM  Result Value Ref Range   WBC 12.1 (H) 4.0 - 10.5 K/uL   RBC 3.38 (L) 3.87 - 5.11 MIL/uL   Hemoglobin 9.7 (L) 12.0 - 15.0 g/dL   HCT 28.3 (L) 36.0 - 46.0 %   MCV 83.7 80.0 - 100.0 fL   MCH 28.7 26.0 - 34.0 pg   MCHC 34.3 30.0 - 36.0 g/dL   RDW 14.4 11.5 - 15.5 %   Platelets 280 150 - 400 K/uL   nRBC 0.0 0.0 - 0.2 %   Neutrophils Relative % 87 %   Neutro Abs 10.5 (H) 1.7 - 7.7 K/uL   Lymphocytes Relative 9 %   Lymphs Abs 1.1 0.7 - 4.0 K/uL   Monocytes Relative 4 %   Monocytes Absolute 0.5 0.1 - 1.0 K/uL   Eosinophils Relative 0 %   Eosinophils Absolute 0.0 0.0 - 0.5 K/uL   Basophils Relative 0  %   Basophils Absolute 0.0 0.0 - 0.1 K/uL   nRBC 0 0 /100 WBC   Abs Immature Granulocytes 0.00 0.00 - 0.07 K/uL    Comment: Performed at [facility], [address] [place], [place] [address]    DG Lumbar Spine 2-3 Views  Result Date: 08/02/2019 CLINICAL DATA:  Radicular back pain to the right lower extremity, no injury. EXAM: LUMBAR SPINE - 2-3 VIEW COMPARISON:  06/10/2013. FINDINGS: Very mild levoconvex curvature of the lumbar spine may be positional. Alignment is otherwise anatomic. Vertebral body and disc space height are normal. Mild facet sclerosis in the lower lumbar spine. Intrauterine contraceptive device is incidentally noted. IMPRESSION: Mild facet sclerosis at L5-S1. Electronically Signed   By: Lorin Picket M.D.   On: 08/02/2019 09:59   DG Shoulder 1V Right  Result Date: 08/03/2019 CLINICAL DATA:  Abscess. EXAM: RIGHT SHOULDER - 1 VIEW COMPARISON:  Chest CT yesterday. FINDINGS: There is no evidence of fracture or dislocation on single view. Overlying monitoring device projects over the acromioclavicular joint limiting assessment. No bony destruction or periosteal reaction. Mild soft tissue edema. No soft tissue air or radiopaque foreign body. IMPRESSION: Mild soft tissue edema. No acute osseous abnormality. Electronically Signed   By: Keith Rake M.D.   On: 08/03/2019 19:40   DG Abd 1 View  Result Date: 08/03/2019 CLINICAL DATA:  Peritonitis. EXAM: ABDOMEN - 1 VIEW COMPARISON:  Abdominal CT yesterday. FINDINGS: Left aspect of the abdomen not included in the field of view. Air-filled small and large bowel in a nonobstructive pattern. No evidence of free air. Intrauterine device in the left pelvis. IMPRESSION: Nonobstructive bowel gas pattern, no evidence of free air. Left aspect of the abdomen not included in the field of view. Electronically Signed   By: Keith Rake M.D.   On: 08/03/2019 19:41   CT Angio Chest PE W and/or Wo Contrast  Addendum Date: 08/03/2019     ADDENDUM REPORT: 08/03/2019 12:49 ADDENDUM: Not mentioned above: Thickening of the intercostal muscle between the right anterior first and second ribs with adjacent pleural reaction and soft tissue edema in the adjacent subcutaneous fat likely reflecting an inflammatory process given the patient's abnormalities along the right iliopsoas muscles and  right sacroiliitis. No drainable fluid collection. These findings were discussed with Dr. Patsey Berthold. Electronically Signed   By: Kathreen Devoid   On: 08/03/2019 12:49   Result Date: 08/03/2019 CLINICAL DATA:  Shortness of breath, severe back pain radiating down the right leg EXAM: CT ANGIOGRAPHY CHEST CT ABDOMEN AND PELVIS WITH CONTRAST TECHNIQUE: Multidetector CT imaging of the chest was performed using the standard protocol during bolus administration of intravenous contrast. Multiplanar CT image reconstructions and MIPs were obtained to evaluate the vascular anatomy. Multidetector CT imaging of the abdomen and pelvis was performed using the standard protocol during bolus administration of intravenous contrast. CONTRAST:  10mL OMNIPAQUE IOHEXOL 350 MG/ML SOLN COMPARISON:  None. FINDINGS: CTA CHEST FINDINGS Cardiovascular: Satisfactory opacification of the pulmonary arteries to the segmental level. No evidence of pulmonary embolism. Normal heart size. No pericardial effusion. Mediastinum/Nodes: No enlarged mediastinal, hilar, or axillary lymph nodes. Thyroid gland, trachea, and esophagus demonstrate no significant findings. Lungs/Pleura: Lungs are clear. No pleural effusion or pneumothorax. Musculoskeletal: No chest wall abnormality. No acute or significant osseous findings. Review of the MIP images confirms the above findings. CT ABDOMEN and PELVIS FINDINGS Hepatobiliary: No focal liver abnormality is seen. No gallstones, gallbladder wall thickening, or biliary dilatation. Pancreas: Unremarkable. No pancreatic ductal dilatation or surrounding inflammatory changes.  Spleen: Normal in size without focal abnormality. Adrenals/Urinary Tract: Adrenal glands are unremarkable. Kidneys are normal, without renal calculi, focal lesion, or hydronephrosis. Bladder is unremarkable. Stomach/Bowel: Stomach is within normal limits. Appendix appears normal. No evidence of bowel wall thickening, distention, or inflammatory changes. Vascular/Lymphatic: No significant vascular findings are present. No enlarged abdominal or pelvic lymph nodes. Reproductive: No adnexal mass. T-type IUD within the uterus. A tip of 'T' has perforated through the uterine wall and projects into the peroneal cavity. Other: No abdominal wall hernia or abnormality. No abdominopelvic ascites. Musculoskeletal: No acute osseous abnormality. No aggressive osseous lesion. Vertebral body heights are maintained and are in normal anatomic alignment. No bone destruction or periosteal reaction. Mild osteoarthritis of bilateral SI joints. Heterogeneous enhancement and severe expansion of the right psoas and iliacus muscles with mild adjacent soft tissue edema. Review of the MIP images confirms the above findings. IMPRESSION: 1. Heterogeneous enhancement and severe expansion of the right psoas and iliacus muscles with mild adjacent soft tissue edema. Overall appearance is concerning for infectious myositis versus less likely muscle strain and intramuscular hemorrhage. Subtle small hypodensities within the iliopsoas muscle measuring up to 13 mm concerning for an intramuscular abscess. The lumbar spine demonstrates no focal abnormality to suggest discitis or osteomyelitis, but MRI of the lumbar spine without and with intravenous contrast is recommended for increased sensitivity if there is concern for discitis/osteomyelitis. 2. T-type IUD within the uterus. A tip of 'T' has perforated through the uterine wall and projects into the peroneal cavity. 3. No acute cardiopulmonary disease. Electronically Signed: By: Kathreen Devoid On:  08/02/2019 12:09   CT GUIDED NEEDLE PLACEMENT  Result Date: 08/02/2019 INDICATION: History of intravenous drug abuse, now with right pelvic/retroperitoneal abscess. Please perform CT-guided aspiration and/or drainage catheter placement. EXAM: CT-GUIDED RIGHT ILIACUS MUSCULAR ABSCESS ASPIRATION COMPARISON:  CT abdomen pelvis-earlier same day; lumbar spine and right hip MRI-earlier same MEDICATIONS: None ANESTHESIA/SEDATION: Moderate (conscious) sedation was employed during this procedure. A total of Versed 3 mg and Fentanyl 150 mcg was administered intravenously. Moderate Sedation Time: 34 minutes. The patient's level of consciousness and vital signs were monitored continuously by radiology nursing throughout the procedure under my direct supervision. CONTRAST:  None COMPLICATIONS: None immediate. PROCEDURE: Informed written consent was obtained from the patient after a discussion of the risks, benefits and alternatives to treatment. The patient was placed supine on the CT gantry and a pre procedural CT was performed re-demonstrating the known abscess/fluid collection within the right iliacus musculature with dominant ill-defined component measuring approximately 2.4 x 2.0 cm (image 60, series 2). The procedure was planned. A timeout was performed prior to the initiation of the procedure. The skin overlying the inferior anterior aspect of the right lower abdomen/pelvis was prepped and draped in the usual sterile fashion. The overlying soft tissues were anesthetized with 1% lidocaine with epinephrine. Appropriate trajectory was planned with the use of a 22 gauge spinal needle. An 18 gauge trocar needle was advanced into the abscess/fluid collection. Appropriate positioning was confirmed with a limited CT scan. Given the small size of the collection as well as lack of peripheral wall enhancement on preceding contrast-enhanced abdominal CT, the decision was made to pursue aspiration at this time. Next, approximately  3 cc of purulent appearing fluid was aspirated as the trocar needle was slowly retracted. All aspirated fluid was capped and sent to the laboratory for analysis. A dressing was applied. The patient tolerated the procedure well without immediate post procedural complication. IMPRESSION: Successful CT guided aspiration of approximately 3 cc of purulent appearing fluid from right iliacus intramuscular abscess, currently not amenable to image guided drainage catheter placement. All aspirated fluid was capped and sent to the laboratory as requested by the ordering clinical team. Electronically Signed   By: Sandi Mariscal M.D.   On: 08/02/2019 17:53   MR LUMBAR SPINE WO CONTRAST  Result Date: 08/02/2019 CLINICAL DATA:  Low back pain with radiculopathy. No recent injury EXAM: MRI LUMBAR SPINE WITHOUT CONTRAST TECHNIQUE: Multiplanar, multisequence MR imaging of the lumbar spine was performed. No intravenous contrast was administered. COMPARISON:  CT 08/02/2019 FINDINGS: Technical note: Examination is slightly degraded by patient motion artifact. Patient was unable to tolerate any further imaging and no postcontrast sequences were able to be performed. Segmentation:  Standard. Alignment:  Physiologic. Vertebrae:  No fracture, evidence of discitis, or bone lesion. Conus medullaris and cauda equina: Conus extends to the L2 level. Conus and cauda equina appear normal. Paraspinal and other soft tissues: Expansion and extensive fluid and edema signal within the right psoas muscle as well as the visualized portion of the right iliacus muscle. Fluid collection within the medial aspect of the right iliacus muscle measures up to 2.2 x 1.3 cm in transaxial dimension. The craniocaudal extent of the collection is not entirely included within the field of view. Fluid signal is seen within both the medial iliacus and or distal psoas muscle at the edge of the field of view which appears to extend from the right sacroiliac joint (series 7,  image 36). There is patchy bone marrow edema within the right sacrum adjacent to the right SI joint. Visualized portion of the left psoas muscle is normal. The posterior paraspinal musculature is normal. The canal from the L4-5 level and caudally is slightly heterogeneous, which is felt to most likely be secondary to motion artifact and prominence of the epidural fat. No definite epidural fluid collection. Disc levels: T12-L1: No significant disc protrusion, foraminal stenosis, or canal stenosis. L1-L2: No significant disc protrusion, foraminal stenosis, or canal stenosis. L2-L3: No significant disc protrusion, foraminal stenosis, or canal stenosis. L3-L4: No significant disc protrusion, foraminal stenosis, or canal stenosis. L4-L5: No significant disc protrusion, foraminal stenosis, or  canal stenosis. L5-S1: No significant disc protrusion, foraminal stenosis, or canal stenosis. IMPRESSION: 1. Limited study due to patient motion artifact. Patient was unable to tolerate any postcontrast imaging. 2. 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 Please see dedicated MRI of the pelvis and right hip which more fully images the musculature for further detail. 3. Negative for osteomyelitis-discitis. 4. Slightly heterogeneous appearance of the distal lumbar canal below the L4-5 level on STIR sequences, which is favored to be secondary to a combination of prominent epidural fat and motion artifact. Postcontrast imaging would be helpful to exclude the presence of epidural extension of infection. 5. No significant disc disease. No evidence of nerve impingement. Electronically Signed   By: Davina Poke D.O.   On: 08/02/2019 13:40   MR LUMBAR SPINE W CONTRAST  Result Date: 08/03/2019 CLINICAL DATA:  Extensive right iliopsoas abscess. EXAM: MRI LUMBAR SPINE WITH CONTRAST TECHNIQUE: Postcontrast sagittal and  axial images were obtained and compared with the prior MRI of 08/02/2019. CONTRAST:  51mL GADAVIST GADOBUTROL 1 MMOL/ML IV SOLN COMPARISON:  MRI dated 08/02/2019 FINDINGS: Segmentation:  Standard. Alignment:  Physiologic. Vertebrae:  No pathologic enhancement after contrast administration. Paraspinal and other soft tissues: Extensive right iliopsoas abscess is again noted. Septic right sacroiliac joint is noted with edema and enhancement in the right gluteal muscles and in the right multifidus muscle erector spinae muscle at the L5-S1 level. Enhancement of the right sacral ala consistent with osteomyelitis. Disc levels: There is no pathologic enhancement within the spinal canal. No evidence of epidural extension of the abscess at this time. No pathologic epidural enhancement. IMPRESSION: 1. 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 2. Extensive right iliopsoas abscess. 3. Edema and enhancement of the right gluteal muscles and right multifidus muscle at the L5-S1 level consistent with myositis. 4. No evidence of epidural extension of abscess at this time. Electronically Signed   By: Lorriane Shire M.D.   On: 08/03/2019 15:06   CT ABDOMEN PELVIS W CONTRAST  Addendum Date: 08/03/2019   ADDENDUM REPORT: 08/03/2019 12:49 ADDENDUM: Not mentioned above: Thickening of the intercostal muscle between the right anterior first and second ribs with adjacent pleural reaction and soft tissue edema in the adjacent subcutaneous fat likely reflecting an inflammatory process given the patient's abnormalities along the right iliopsoas muscles and right sacroiliitis. No drainable fluid collection. These findings were discussed with Dr. Patsey Berthold. Electronically Signed   By: Kathreen Devoid   On: 08/03/2019 12:49   Result Date: 08/03/2019 CLINICAL DATA:  Shortness of breath, severe back pain radiating down the right leg EXAM: CT ANGIOGRAPHY CHEST CT ABDOMEN AND PELVIS WITH CONTRAST TECHNIQUE:  Multidetector CT imaging of the chest was performed using the standard protocol during bolus administration of intravenous contrast. Multiplanar CT image reconstructions and MIPs were obtained to evaluate the vascular anatomy. Multidetector CT imaging of the abdomen and pelvis was performed using the standard protocol during bolus administration of intravenous contrast. CONTRAST:  26mL OMNIPAQUE IOHEXOL 350 MG/ML SOLN COMPARISON:  None. FINDINGS: CTA CHEST FINDINGS Cardiovascular: Satisfactory opacification of the pulmonary arteries to the segmental level. No evidence of pulmonary embolism. Normal heart size. No pericardial effusion. Mediastinum/Nodes: No enlarged mediastinal, hilar, or axillary lymph nodes. Thyroid gland, trachea, and esophagus demonstrate no significant findings. Lungs/Pleura: Lungs are clear. No pleural effusion or pneumothorax. Musculoskeletal: No chest wall abnormality. No acute or significant osseous findings. Review of the MIP  images confirms the above findings. CT ABDOMEN and PELVIS FINDINGS Hepatobiliary: No focal liver abnormality is seen. No gallstones, gallbladder wall thickening, or biliary dilatation. Pancreas: Unremarkable. No pancreatic ductal dilatation or surrounding inflammatory changes. Spleen: Normal in size without focal abnormality. Adrenals/Urinary Tract: Adrenal glands are unremarkable. Kidneys are normal, without renal calculi, focal lesion, or hydronephrosis. Bladder is unremarkable. Stomach/Bowel: Stomach is within normal limits. Appendix appears normal. No evidence of bowel wall thickening, distention, or inflammatory changes. Vascular/Lymphatic: No significant vascular findings are present. No enlarged abdominal or pelvic lymph nodes. Reproductive: No adnexal mass. T-type IUD within the uterus. A tip of 'T' has perforated through the uterine wall and projects into the peroneal cavity. Other: No abdominal wall hernia or abnormality. No abdominopelvic ascites.  Musculoskeletal: No acute osseous abnormality. No aggressive osseous lesion. Vertebral body heights are maintained and are in normal anatomic alignment. No bone destruction or periosteal reaction. Mild osteoarthritis of bilateral SI joints. Heterogeneous enhancement and severe expansion of the right psoas and iliacus muscles with mild adjacent soft tissue edema. Review of the MIP images confirms the above findings. IMPRESSION: 1. Heterogeneous enhancement and severe expansion of the right psoas and iliacus muscles with mild adjacent soft tissue edema. Overall appearance is concerning for infectious myositis versus less likely muscle strain and intramuscular hemorrhage. Subtle small hypodensities within the iliopsoas muscle measuring up to 13 mm concerning for an intramuscular abscess. The lumbar spine demonstrates no focal abnormality to suggest discitis or osteomyelitis, but MRI of the lumbar spine without and with intravenous contrast is recommended for increased sensitivity if there is concern for discitis/osteomyelitis. 2. T-type IUD within the uterus. A tip of 'T' has perforated through the uterine wall and projects into the peroneal cavity. 3. No acute cardiopulmonary disease. Electronically Signed: By: Kathreen Devoid On: 08/02/2019 12:09   DG Hand 2 View Right  Result Date: 08/03/2019 CLINICAL DATA:  Abscess. Peritonitis. EXAM: RIGHT HAND - 2 VIEW COMPARISON:  Right hand radiograph 01/30/2019 FINDINGS: There is no evidence of fracture or dislocation. There is no evidence of arthropathy or other focal bone abnormality. Generalized soft tissue edema. No soft tissue air or radiopaque foreign body. IMPRESSION: Generalized soft tissue edema. No soft tissue air or radiopaque foreign body. No osseous abnormality. Electronically Signed   By: Keith Rake M.D.   On: 08/03/2019 19:38   MR HIP RIGHT WO CONTRAST  Result Date: 08/02/2019 CLINICAL DATA:  Severe back pain radiating down the right hip and leg. Fever.  EXAM: MR OF THE RIGHT HIP WITHOUT CONTRAST TECHNIQUE: Multiplanar, multisequence MR imaging was performed. No intravenous contrast was administered. COMPARISON:  CT scan of the abdomen and pelvis dated 08/02/2019 FINDINGS: Bones: There is abnormal edema in the right side of the sacrum and in the right iliac bone with fluid in the right SI joint, likely representing osteomyelitis and sepsis of the right sacroiliac joint. Articular cartilage and labrum Articular cartilage:  Normal. Labrum:  Normal. Joint or bursal effusion Joint effusion:  Trace joint effusion. Bursae: No bursitis. Muscles and tendons Muscles and tendons: Extensive right iliopsoas abscess extending from at least the L3 level to the insertion of the iliopsoas tendon on the lesser trochanter. Is extensive edema in the iliopsoas muscle consistent with myositis. There is also edema in 1 of the right hip abductor muscles with fluid along the fascia of the muscles in the anterior aspect of the proximal right thigh likely representing infectious fasciitis. There is also extension of abscess into the right gluteal muscles  through the a posterior aspect of the right sacroiliac joint seen on images 1 and 2 of series 19. There is edema in the gluteus minimus, medius, and maximus muscles consistent with myositis. There is edema in the right piriformis muscle consistent with myositis seen on image 8 of series 19. Other findings Miscellaneous:   Distended urinary bladder. IMPRESSION: 1. Extensive right iliopsoas abscess extending from at least the L3-4 level to the insertion of the iliopsoas tendon on the lesser trochanter. 2. 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 3. Myositis of the right gluteus minimus, medius, and maximus and right piriformis muscles. 4. 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 5. Abscess extends through the posterior aspect of the right SI joint into the gluteal musculature. 6. Fluid along the fascial planes in  the anterior aspect of the proximal right thigh likely representing infectious fasciitis. 7. MRI with contrast may better define the extent of the infection and provide a baseline for a serial evaluation. Electronically Signed   By: Lorriane Shire M.D.   On: 08/02/2019 13:45   DG Chest Port 1 View  Result Date: 08/03/2019 CLINICAL DATA:  Status post central line placement. EXAM: PORTABLE CHEST 1 VIEW COMPARISON:  Single-view of the chest 06/10/2013. FINDINGS: Left IJ approach central venous catheter is in place with the tip projecting in the right atrium. Recommend 3 cm withdrawal. Elevation of the right hemidiaphragm is noted. There is mild basilar atelectasis. No pneumothorax or pleural effusion. Heart size is normal. IMPRESSION: Left IJ catheter tip projects in the right atrium. Recommend withdrawal of 3 cm. Negative for pneumothorax. No acute disease. Electronically Signed   By: Inge Rise M.D.   On: 08/03/2019 12:20   ECHOCARDIOGRAM LIMITED  Result Date: 08/03/2019    ECHOCARDIOGRAM LIMITED REPORT   Patient Name:   Leslie Molina Date of Exam: 08/03/2019 Medical Rec #:  [id number]  Height:       61.0 in Accession #:    [id number] Weight:       145.0 lb Date of Birth:  11/20/1985  BSA:          1.647 m Patient Age:    33 years   BP:           87/42 mmHg Patient Gender: F          HR:           121 bpm. Exam Location:  [facility] Procedure: 2D Echo, Cardiac Doppler and Color Doppler Indications:     Endocarditis 138  History:         Patient has no prior history of Echocardiogram examinations.                  Anxiety, bipolar 1 disorder, migraine.  Sonographer:     Sherrie Sport RDCS (AE) Referring Phys:  [place] Diagnosing Phys: Bartholome Bill MD IMPRESSIONS  1. No evidence of vegetations. Somewhat tachycardic but able to see valves fairly well.  2. Left ventricular ejection fraction, by estimation, is 65 to 70%. The left ventricle has normal function. Left ventricular diastolic parameters were  normal.  3. Right ventricular systolic function is normal. The right ventricular size is mildly enlarged. There is moderately elevated pulmonary artery systolic pressure.  4. Left atrial size was mildly dilated.  5. The mitral valve is grossly normal. Mild mitral valve regurgitation.  6. The aortic valve is grossly normal. Aortic valve regurgitation is trivial. Conclusion(s)/Recommendation(s): No evidence of valvular  vegetations on this transthoracic echocardiogram. Would recommend a transesophageal echocardiogram to exclude infective endocarditis if clinically indicated. FINDINGS  Left Ventricle: Left ventricular ejection fraction, by estimation, is 65 to 70%. The left ventricle has normal function. The left ventricular internal cavity size was normal in size. There is no left ventricular hypertrophy. Right Ventricle: The right ventricular size is mildly enlarged. No increase in right ventricular wall thickness. Right ventricular systolic function is normal. There is moderately elevated pulmonary artery systolic pressure. The tricuspid regurgitant velocity is 3.11 m/s, and with an assumed right atrial pressure of 10 mmHg, the estimated right ventricular systolic pressure is [id number] mmHg. Left Atrium: Left atrial size was mildly dilated. Pericardium: There is no evidence of pericardial effusion. Mitral Valve: The mitral valve is grossly normal. Mild mitral valve regurgitation. There is no evidence of mitral valve vegetation. Tricuspid Valve: The tricuspid valve is grossly normal. Tricuspid valve regurgitation is mild. Aortic Valve: The aortic valve is grossly normal. Aortic valve regurgitation is trivial. There is no evidence of aortic valve vegetation. Pulmonic Valve: The pulmonic valve was grossly normal. Pulmonic valve regurgitation is trivial. There is no evidence of pulmonic valve vegetation. Aorta: The aortic root is normal in size and structure.  LEFT VENTRICLE PLAX 2D LVIDd:         4.82 cm LVIDs:         2.60  cm LV PW:         1.11 cm LV IVS:        0.95 cm LVOT diam:     2.20 cm LVOT Area:     3.80 cm  LEFT ATRIUM         Index LA diam:    4.10 cm 2.49 cm/m                        PULMONIC VALVE AORTA                 PV Vmax:        1.00 m/s Ao Root diam: 2.60 cm PV Peak grad:   4.0 mmHg                       RVOT Peak grad: 8 mmHg  TRICUSPID VALVE TR Peak grad:   38.7 mmHg TR Vmax:        311.00 cm/s  SHUNTS Systemic Diam: 2.20 cm Bartholome Bill MD Electronically signed by Bartholome Bill MD Signature Date/Time: 08/03/2019/11:20:44 AM    Final     ROS - all of the below systems have been reviewed with the patient and positives are indicated with bold text General: chills, fever or night sweats Eyes: blurry vision or double vision ENT: epistaxis or sore throat Allergy/Immunology: itchy/watery eyes or nasal congestion Hematologic/Lymphatic: bleeding problems, blood clots or swollen lymph nodes Endocrine: temperature intolerance or unexpected weight changes Breast: new or changing breast lumps or nipple discharge Resp: cough, shortness of breath, or wheezing CV: chest pain or dyspnea on exertion GI: as per HPI GU: dysuria, trouble voiding, or hematuria MSK: joint pain or joint stiffness Neuro: TIA or stroke symptoms Derm: pruritus and skin lesion changes Psych: anxiety and depression  PE Blood pressure (!) 101/58, pulse (!) 128, resp. rate 16, SpO2 90 %. Constitutional: NAD; conversant; no deformities; uncomfortable at times Eyes: Moist conjunctiva; no lid lag; anicteric; PERRL Neck: Trachea midline; no thyromegaly; right supraclavicular TTP, some induration and mild cellulitis Lungs: Normal respiratory effort; no  tactile fremitus CV: tachy; no palpable thrills; no pitting edema GI: Abd soft, distended, mild TTP, small incisional umbilical hernia -reducible; no palpable hepatosplenomegaly MSK: right hip TTP, some decreased ROM; no clubbing/cyanosis; dorsum of right hand over 2nd, 3rd metacarpal some  swelling, cellulitis and TTP; right thigh - no cellulitis, induration, nontender;  GU: chaperone present - buttocks - no cellulitis, crepitus, or induration.  Psychiatric: Appropriate affect; alert and oriented x3 Lymphatic: No palpable cervical or axillary lymphadenopathy Skin- no nodules, scattered psoriasis lesions on extremities, old skin scars throughout;  Scattered induration/cellulitis as above  Results for orders placed or performed during the hospital encounter of 08/03/19 (from the past 48 hour(s))  Basic metabolic panel     Status: Abnormal   Collection Time: 08/03/19  6:47 PM  Result Value Ref Range   Sodium 134 (L) 135 - 145 mmol/L   Potassium 2.9 (L) 3.5 - 5.1 mmol/L   Chloride 102 98 - 111 mmol/L   CO2 22 22 - 32 mmol/L   Glucose, Bld 121 (H) 70 - 99 mg/dL    Comment: Glucose reference range applies only to samples taken after fasting for at least 8 hours.   BUN 6 6 - 20 mg/dL   Creatinine, Ser 0.55 0.44 - 1.00 mg/dL   Calcium 7.6 (L) 8.9 - 10.3 mg/dL   GFR calc non Af Amer >60 >60 mL/min   GFR calc Af Amer >60 >60 mL/min   Anion gap 10 5 - 15    Comment: Performed at [facility] [address] [place], [place] [address]  Lactic acid, plasma     Status: None   Collection Time: 08/03/19  6:47 PM  Result Value Ref Range   Lactic Acid, Venous 0.8 0.5 - 1.9 mmol/L    Comment: Performed at [facility] [address] [place], [place] [address]  Magnesium     Status: None   Collection Time: 08/03/19  6:47 PM  Result Value Ref Range   Magnesium 1.9 1.7 - 2.4 mg/dL    Comment: Performed at [facility], [place] [address] [place], [place] [address]  Phosphorus     Status: Abnormal   Collection Time: 08/03/19  6:47 PM  Result Value Ref Range   Phosphorus 2.2 (L) 2.5 - 4.6 mg/dL    Comment: Performed at [facility] [address] [place], [place] [address]  CBC WITH DIFFERENTIAL     Status: Abnormal   Collection Time: 08/03/19  6:47 PM  Result  Value Ref Range   WBC 12.1 (H) 4.0 - 10.5 K/uL   RBC 3.38 (L) 3.87 - 5.11 MIL/uL   Hemoglobin 9.7 (L) 12.0 - 15.0 g/dL   HCT 28.3 (L) 36.0 - 46.0 %   MCV 83.7 80.0 - 100.0 fL   MCH 28.7 26.0 - 34.0 pg   MCHC 34.3 30.0 - 36.0 g/dL   RDW 14.4 11.5 - 15.5 %   Platelets 280 150 - 400 K/uL   nRBC 0.0 0.0 - 0.2 %   Neutrophils Relative % 87 %   Neutro Abs 10.5 (H) 1.7 - 7.7 K/uL   Lymphocytes Relative 9 %   Lymphs Abs 1.1 0.7 - 4.0 K/uL   Monocytes Relative 4 %   Monocytes Absolute 0.5 0.1 - 1.0 K/uL   Eosinophils Relative 0 %   Eosinophils Absolute 0.0 0.0 - 0.5 K/uL   Basophils Relative 0 %   Basophils Absolute 0.0 0.0 - 0.1 K/uL  nRBC 0 0 /100 WBC   Abs Immature Granulocytes 0.00 0.00 - 0.07 K/uL    Comment: Performed at [facility], [place] [address], [place], [place] [address]    DG Lumbar Spine 2-3 Views  Result Date: 08/02/2019 CLINICAL DATA:  Radicular back pain to the right lower extremity, no injury. EXAM: LUMBAR SPINE - 2-3 VIEW COMPARISON:  06/10/2013. FINDINGS: Very mild levoconvex curvature of the lumbar spine may be positional. Alignment is otherwise anatomic. Vertebral body and disc space height are normal. Mild facet sclerosis in the lower lumbar spine. Intrauterine contraceptive device is incidentally noted. IMPRESSION: Mild facet sclerosis at L5-S1. Electronically Signed   By: Lorin Picket M.D.   On: 08/02/2019 09:59   DG Shoulder 1V Right  Result Date: 08/03/2019 CLINICAL DATA:  Abscess. EXAM: RIGHT SHOULDER - 1 VIEW COMPARISON:  Chest CT yesterday. FINDINGS: There is no evidence of fracture or dislocation on single view. Overlying monitoring device projects over the acromioclavicular joint limiting assessment. No bony destruction or periosteal reaction. Mild soft tissue edema. No soft tissue air or radiopaque foreign body. IMPRESSION: Mild soft tissue edema. No acute osseous abnormality. Electronically Signed   By: Keith Rake M.D.   On: 08/03/2019 19:40    DG Abd 1 View  Result Date: 08/03/2019 CLINICAL DATA:  Peritonitis. EXAM: ABDOMEN - 1 VIEW COMPARISON:  Abdominal CT yesterday. FINDINGS: Left aspect of the abdomen not included in the field of view. Air-filled small and large bowel in a nonobstructive pattern. No evidence of free air. Intrauterine device in the left pelvis. IMPRESSION: Nonobstructive bowel gas pattern, no evidence of free air. Left aspect of the abdomen not included in the field of view. Electronically Signed   By: Keith Rake M.D.   On: 08/03/2019 19:41   CT Angio Chest PE W and/or Wo Contrast  Addendum Date: 08/03/2019   ADDENDUM REPORT: 08/03/2019 12:49 ADDENDUM: Not mentioned above: Thickening of the intercostal muscle between the right anterior first and second ribs with adjacent pleural reaction and soft tissue edema in the adjacent subcutaneous fat likely reflecting an inflammatory process given the patient's abnormalities along the right iliopsoas muscles and right sacroiliitis. No drainable fluid collection. These findings were discussed with Dr. Patsey Berthold. Electronically Signed   By: Kathreen Devoid   On: 08/03/2019 12:49   Result Date: 08/03/2019 CLINICAL DATA:  Shortness of breath, severe back pain radiating down the right leg EXAM: CT ANGIOGRAPHY CHEST CT ABDOMEN AND PELVIS WITH CONTRAST TECHNIQUE: Multidetector CT imaging of the chest was performed using the standard protocol during bolus administration of intravenous contrast. Multiplanar CT image reconstructions and MIPs were obtained to evaluate the vascular anatomy. Multidetector CT imaging of the abdomen and pelvis was performed using the standard protocol during bolus administration of intravenous contrast. CONTRAST:  4mL OMNIPAQUE IOHEXOL 350 MG/ML SOLN COMPARISON:  None. FINDINGS: CTA CHEST FINDINGS Cardiovascular: Satisfactory opacification of the pulmonary arteries to the segmental level. No evidence of pulmonary embolism. Normal heart size. No pericardial  effusion. Mediastinum/Nodes: No enlarged mediastinal, hilar, or axillary lymph nodes. Thyroid gland, trachea, and esophagus demonstrate no significant findings. Lungs/Pleura: Lungs are clear. No pleural effusion or pneumothorax. Musculoskeletal: No chest wall abnormality. No acute or significant osseous findings. Review of the MIP images confirms the above findings. CT ABDOMEN and PELVIS FINDINGS Hepatobiliary: No focal liver abnormality is seen. No gallstones, gallbladder wall thickening, or biliary dilatation. Pancreas: Unremarkable. No pancreatic ductal dilatation or surrounding inflammatory changes. Spleen: Normal in size without focal abnormality.  Adrenals/Urinary Tract: Adrenal glands are unremarkable. Kidneys are normal, without renal calculi, focal lesion, or hydronephrosis. Bladder is unremarkable. Stomach/Bowel: Stomach is within normal limits. Appendix appears normal. No evidence of bowel wall thickening, distention, or inflammatory changes. Vascular/Lymphatic: No significant vascular findings are present. No enlarged abdominal or pelvic lymph nodes. Reproductive: No adnexal mass. T-type IUD within the uterus. A tip of 'T' has perforated through the uterine wall and projects into the peroneal cavity. Other: No abdominal wall hernia or abnormality. No abdominopelvic ascites. Musculoskeletal: No acute osseous abnormality. No aggressive osseous lesion. Vertebral body heights are maintained and are in normal anatomic alignment. No bone destruction or periosteal reaction. Mild osteoarthritis of bilateral SI joints. Heterogeneous enhancement and severe expansion of the right psoas and iliacus muscles with mild adjacent soft tissue edema. Review of the MIP images confirms the above findings. IMPRESSION: 1. Heterogeneous enhancement and severe expansion of the right psoas and iliacus muscles with mild adjacent soft tissue edema. Overall appearance is concerning for infectious myositis versus less likely muscle  strain and intramuscular hemorrhage. Subtle small hypodensities within the iliopsoas muscle measuring up to 13 mm concerning for an intramuscular abscess. The lumbar spine demonstrates no focal abnormality to suggest discitis or osteomyelitis, but MRI of the lumbar spine without and with intravenous contrast is recommended for increased sensitivity if there is concern for discitis/osteomyelitis. 2. T-type IUD within the uterus. A tip of 'T' has perforated through the uterine wall and projects into the peroneal cavity. 3. No acute cardiopulmonary disease. Electronically Signed: By: Kathreen Devoid On: 08/02/2019 12:09   CT GUIDED NEEDLE PLACEMENT  Result Date: 08/02/2019 INDICATION: History of intravenous drug abuse, now with right pelvic/retroperitoneal abscess. Please perform CT-guided aspiration and/or drainage catheter placement. EXAM: CT-GUIDED RIGHT ILIACUS MUSCULAR ABSCESS ASPIRATION COMPARISON:  CT abdomen pelvis-earlier same day; lumbar spine and right hip MRI-earlier same MEDICATIONS: None ANESTHESIA/SEDATION: Moderate (conscious) sedation was employed during this procedure. A total of Versed 3 mg and Fentanyl 150 mcg was administered intravenously. Moderate Sedation Time: 34 minutes. The patient's level of consciousness and vital signs were monitored continuously by radiology nursing throughout the procedure under my direct supervision. CONTRAST:  None COMPLICATIONS: None immediate. PROCEDURE: Informed written consent was obtained from the patient after a discussion of the risks, benefits and alternatives to treatment. The patient was placed supine on the CT gantry and a pre procedural CT was performed re-demonstrating the known abscess/fluid collection within the right iliacus musculature with dominant ill-defined component measuring approximately 2.4 x 2.0 cm (image 60, series 2). The procedure was planned. A timeout was performed prior to the initiation of the procedure. The skin overlying the inferior  anterior aspect of the right lower abdomen/pelvis was prepped and draped in the usual sterile fashion. The overlying soft tissues were anesthetized with 1% lidocaine with epinephrine. Appropriate trajectory was planned with the use of a 22 gauge spinal needle. An 18 gauge trocar needle was advanced into the abscess/fluid collection. Appropriate positioning was confirmed with a limited CT scan. Given the small size of the collection as well as lack of peripheral wall enhancement on preceding contrast-enhanced abdominal CT, the decision was made to pursue aspiration at this time. Next, approximately 3 cc of purulent appearing fluid was aspirated as the trocar needle was slowly retracted. All aspirated fluid was capped and sent to the laboratory for analysis. A dressing was applied. The patient tolerated the procedure well without immediate post procedural complication. IMPRESSION: Successful CT guided aspiration of approximately 3 cc of  purulent appearing fluid from right iliacus intramuscular abscess, currently not amenable to image guided drainage catheter placement. All aspirated fluid was capped and sent to the laboratory as requested by the ordering clinical team. Electronically Signed   By: Sandi Mariscal M.D.   On: 08/02/2019 17:53   MR LUMBAR SPINE WO CONTRAST  Result Date: 08/02/2019 CLINICAL DATA:  Low back pain with radiculopathy. No recent injury EXAM: MRI LUMBAR SPINE WITHOUT CONTRAST TECHNIQUE: Multiplanar, multisequence MR imaging of the lumbar spine was performed. No intravenous contrast was administered. COMPARISON:  CT 08/02/2019 FINDINGS: Technical note: Examination is slightly degraded by patient motion artifact. Patient was unable to tolerate any further imaging and no postcontrast sequences were able to be performed. Segmentation:  Standard. Alignment:  Physiologic. Vertebrae:  No fracture, evidence of discitis, or bone lesion. Conus medullaris and cauda equina: Conus extends to the L2 level.  Conus and cauda equina appear normal. Paraspinal and other soft tissues: Expansion and extensive fluid and edema signal within the right psoas muscle as well as the visualized portion of the right iliacus muscle. Fluid collection within the medial aspect of the right iliacus muscle measures up to 2.2 x 1.3 cm in transaxial dimension. The craniocaudal extent of the collection is not entirely included within the field of view. Fluid signal is seen within both the medial iliacus and or distal psoas muscle at the edge of the field of view which appears to extend from the right sacroiliac joint (series 7, image 36). There is patchy bone marrow edema within the right sacrum adjacent to the right SI joint. Visualized portion of the left psoas muscle is normal. The posterior paraspinal musculature is normal. The canal from the L4-5 level and caudally is slightly heterogeneous, which is felt to most likely be secondary to motion artifact and prominence of the epidural fat. No definite epidural fluid collection. Disc levels: T12-L1: No significant disc protrusion, foraminal stenosis, or canal stenosis. L1-L2: No significant disc protrusion, foraminal stenosis, or canal stenosis. L2-L3: No significant disc protrusion, foraminal stenosis, or canal stenosis. L3-L4: No significant disc protrusion, foraminal stenosis, or canal stenosis. L4-L5: No significant disc protrusion, foraminal stenosis, or canal stenosis. L5-S1: No significant disc protrusion, foraminal stenosis, or canal stenosis. IMPRESSION: 1. Limited study due to patient motion artifact. Patient was unable to tolerate any postcontrast imaging. 2. 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 Please see dedicated MRI of the pelvis and right hip which more fully images the musculature for further detail. 3. Negative for osteomyelitis-discitis. 4. Slightly  heterogeneous appearance of the distal lumbar canal below the L4-5 level on STIR sequences, which is favored to be secondary to a combination of prominent epidural fat and motion artifact. Postcontrast imaging would be helpful to exclude the presence of epidural extension of infection. 5. No significant disc disease. No evidence of nerve impingement. Electronically Signed   By: Davina Poke D.O.   On: 08/02/2019 13:40   MR LUMBAR SPINE W CONTRAST  Result Date: 08/03/2019 CLINICAL DATA:  Extensive right iliopsoas abscess. EXAM: MRI LUMBAR SPINE WITH CONTRAST TECHNIQUE: Postcontrast sagittal and axial images were obtained and compared with the prior MRI of 08/02/2019. CONTRAST:  99mL GADAVIST GADOBUTROL 1 MMOL/ML IV SOLN COMPARISON:  MRI dated 08/02/2019 FINDINGS: Segmentation:  Standard. Alignment:  Physiologic. Vertebrae:  No pathologic enhancement after contrast administration. Paraspinal and other soft tissues: Extensive right iliopsoas abscess is again noted. Septic right sacroiliac joint is  noted with edema and enhancement in the right gluteal muscles and in the right multifidus muscle erector spinae muscle at the L5-S1 level. Enhancement of the right sacral ala consistent with osteomyelitis. Disc levels: There is no pathologic enhancement within the spinal canal. No evidence of epidural extension of the abscess at this time. No pathologic epidural enhancement. IMPRESSION: 1. 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 2. Extensive right iliopsoas abscess. 3. Edema and enhancement of the right gluteal muscles and right multifidus muscle at the L5-S1 level consistent with myositis. 4. No evidence of epidural extension of abscess at this time. Electronically Signed   By: Lorriane Shire M.D.   On: 08/03/2019 15:06   CT ABDOMEN PELVIS W CONTRAST  Addendum Date: 08/03/2019   ADDENDUM REPORT: 08/03/2019 12:49 ADDENDUM: Not mentioned above: Thickening of the intercostal  muscle between the right anterior first and second ribs with adjacent pleural reaction and soft tissue edema in the adjacent subcutaneous fat likely reflecting an inflammatory process given the patient's abnormalities along the right iliopsoas muscles and right sacroiliitis. No drainable fluid collection. These findings were discussed with Dr. Patsey Berthold. Electronically Signed   By: Kathreen Devoid   On: 08/03/2019 12:49   Result Date: 08/03/2019 CLINICAL DATA:  Shortness of breath, severe back pain radiating down the right leg EXAM: CT ANGIOGRAPHY CHEST CT ABDOMEN AND PELVIS WITH CONTRAST TECHNIQUE: Multidetector CT imaging of the chest was performed using the standard protocol during bolus administration of intravenous contrast. Multiplanar CT image reconstructions and MIPs were obtained to evaluate the vascular anatomy. Multidetector CT imaging of the abdomen and pelvis was performed using the standard protocol during bolus administration of intravenous contrast. CONTRAST:  86mL OMNIPAQUE IOHEXOL 350 MG/ML SOLN COMPARISON:  None. FINDINGS: CTA CHEST FINDINGS Cardiovascular: Satisfactory opacification of the pulmonary arteries to the segmental level. No evidence of pulmonary embolism. Normal heart size. No pericardial effusion. Mediastinum/Nodes: No enlarged mediastinal, hilar, or axillary lymph nodes. Thyroid gland, trachea, and esophagus demonstrate no significant findings. Lungs/Pleura: Lungs are clear. No pleural effusion or pneumothorax. Musculoskeletal: No chest wall abnormality. No acute or significant osseous findings. Review of the MIP images confirms the above findings. CT ABDOMEN and PELVIS FINDINGS Hepatobiliary: No focal liver abnormality is seen. No gallstones, gallbladder wall thickening, or biliary dilatation. Pancreas: Unremarkable. No pancreatic ductal dilatation or surrounding inflammatory changes. Spleen: Normal in size without focal abnormality. Adrenals/Urinary Tract: Adrenal glands are  unremarkable. Kidneys are normal, without renal calculi, focal lesion, or hydronephrosis. Bladder is unremarkable. Stomach/Bowel: Stomach is within normal limits. Appendix appears normal. No evidence of bowel wall thickening, distention, or inflammatory changes. Vascular/Lymphatic: No significant vascular findings are present. No enlarged abdominal or pelvic lymph nodes. Reproductive: No adnexal mass. T-type IUD within the uterus. A tip of 'T' has perforated through the uterine wall and projects into the peroneal cavity. Other: No abdominal wall hernia or abnormality. No abdominopelvic ascites. Musculoskeletal: No acute osseous abnormality. No aggressive osseous lesion. Vertebral body heights are maintained and are in normal anatomic alignment. No bone destruction or periosteal reaction. Mild osteoarthritis of bilateral SI joints. Heterogeneous enhancement and severe expansion of the right psoas and iliacus muscles with mild adjacent soft tissue edema. Review of the MIP images confirms the above findings. IMPRESSION: 1. Heterogeneous enhancement and severe expansion of the right psoas and iliacus muscles with mild adjacent soft tissue edema. Overall appearance is concerning for infectious myositis versus less likely muscle strain and intramuscular hemorrhage. Subtle small hypodensities within the  iliopsoas muscle measuring up to 13 mm concerning for an intramuscular abscess. The lumbar spine demonstrates no focal abnormality to suggest discitis or osteomyelitis, but MRI of the lumbar spine without and with intravenous contrast is recommended for increased sensitivity if there is concern for discitis/osteomyelitis. 2. T-type IUD within the uterus. A tip of 'T' has perforated through the uterine wall and projects into the peroneal cavity. 3. No acute cardiopulmonary disease. Electronically Signed: By: Kathreen Devoid On: 08/02/2019 12:09   DG Hand 2 View Right  Result Date: 08/03/2019 CLINICAL DATA:  Abscess.  Peritonitis. EXAM: RIGHT HAND - 2 VIEW COMPARISON:  Right hand radiograph 01/30/2019 FINDINGS: There is no evidence of fracture or dislocation. There is no evidence of arthropathy or other focal bone abnormality. Generalized soft tissue edema. No soft tissue air or radiopaque foreign body. IMPRESSION: Generalized soft tissue edema. No soft tissue air or radiopaque foreign body. No osseous abnormality. Electronically Signed   By: Keith Rake M.D.   On: 08/03/2019 19:38   MR HIP RIGHT WO CONTRAST  Result Date: 08/02/2019 CLINICAL DATA:  Severe back pain radiating down the right hip and leg. Fever. EXAM: MR OF THE RIGHT HIP WITHOUT CONTRAST TECHNIQUE: Multiplanar, multisequence MR imaging was performed. No intravenous contrast was administered. COMPARISON:  CT scan of the abdomen and pelvis dated 08/02/2019 FINDINGS: Bones: There is abnormal edema in the right side of the sacrum and in the right iliac bone with fluid in the right SI joint, likely representing osteomyelitis and sepsis of the right sacroiliac joint. Articular cartilage and labrum Articular cartilage:  Normal. Labrum:  Normal. Joint or bursal effusion Joint effusion:  Trace joint effusion. Bursae: No bursitis. Muscles and tendons Muscles and tendons: Extensive right iliopsoas abscess extending from at least the L3 level to the insertion of the iliopsoas tendon on the lesser trochanter. Is extensive edema in the iliopsoas muscle consistent with myositis. There is also edema in 1 of the right hip abductor muscles with fluid along the fascia of the muscles in the anterior aspect of the proximal right thigh likely representing infectious fasciitis. There is also extension of abscess into the right gluteal muscles through the a posterior aspect of the right sacroiliac joint seen on images 1 and 2 of series 19. There is edema in the gluteus minimus, medius, and maximus muscles consistent with myositis. There is edema in the right piriformis muscle  consistent with myositis seen on image 8 of series 19. Other findings Miscellaneous:   Distended urinary bladder. IMPRESSION: 1. Extensive right iliopsoas abscess extending from at least the L3-4 level to the insertion of the iliopsoas tendon on the lesser trochanter. 2. 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 3. Myositis of the right gluteus minimus, medius, and maximus and right piriformis muscles. 4. 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 5. Abscess extends through the posterior aspect of the right SI joint into the gluteal musculature. 6. Fluid along the fascial planes in the anterior aspect of the proximal right thigh likely representing infectious fasciitis. 7. MRI with contrast may better define the extent of the infection and provide a baseline for a serial evaluation. Electronically Signed   By: Lorriane Shire M.D.   On: 08/02/2019 13:45   DG Chest Port 1 View  Result Date: 08/03/2019 CLINICAL DATA:  Status post central line placement. EXAM: PORTABLE CHEST 1 VIEW COMPARISON:  Single-view of the chest 06/10/2013. FINDINGS: Left IJ approach central venous catheter is in place with  the tip projecting in the right atrium. Recommend 3 cm withdrawal. Elevation of the right hemidiaphragm is noted. There is mild basilar atelectasis. No pneumothorax or pleural effusion. Heart size is normal. IMPRESSION: Left IJ catheter tip projects in the right atrium. Recommend withdrawal of 3 cm. Negative for pneumothorax. No acute disease. Electronically Signed   By: Inge Rise M.D.   On: 08/03/2019 12:20   ECHOCARDIOGRAM LIMITED  Result Date: 08/03/2019    ECHOCARDIOGRAM LIMITED REPORT   Patient Name:   Leslie Molina Date of Exam: 08/03/2019 Medical Rec #:  [id number]  Height:       61.0 in Accession #:    [id number] Weight:       145.0 lb Date of Birth:  [birth year]  BSA:          1.647 m Patient Age:    33 years   BP:           87/42 mmHg Patient Gender: F          HR:           121 bpm.  Exam Location:  [facility] Procedure: 2D Echo, Cardiac Doppler and Color Doppler Indications:     Endocarditis 138  History:         Patient has no prior history of Echocardiogram examinations.                  Anxiety, bipolar 1 disorder, migraine.  Sonographer:     Sherrie Sport RDCS (AE) Referring Phys:  [place] Diagnosing Phys: Bartholome Bill MD IMPRESSIONS  1. No evidence of vegetations. Somewhat tachycardic but able to see valves fairly well.  2. Left ventricular ejection fraction, by estimation, is 65 to 70%. The left ventricle has normal function. Left ventricular diastolic parameters were normal.  3. Right ventricular systolic function is normal. The right ventricular size is mildly enlarged. There is moderately elevated pulmonary artery systolic pressure.  4. Left atrial size was mildly dilated.  5. The mitral valve is grossly normal. Mild mitral valve regurgitation.  6. The aortic valve is grossly normal. Aortic valve regurgitation is trivial. Conclusion(s)/Recommendation(s): No evidence of valvular vegetations on this transthoracic echocardiogram. Would recommend a transesophageal echocardiogram to exclude infective endocarditis if clinically indicated. FINDINGS  Left Ventricle: Left ventricular ejection fraction, by estimation, is 65 to 70%. The left ventricle has normal function. The left ventricular internal cavity size was normal in size. There is no left ventricular hypertrophy. Right Ventricle: The right ventricular size is mildly enlarged. No increase in right ventricular wall thickness. Right ventricular systolic function is normal. There is moderately elevated pulmonary artery systolic pressure. The tricuspid regurgitant velocity is 3.11 m/s, and with an assumed right atrial pressure of 10 mmHg, the estimated right ventricular systolic pressure is [id number] mmHg. Left Atrium: Left atrial size was mildly dilated. Pericardium: There is no evidence of pericardial effusion. Mitral Valve: The  mitral valve is grossly normal. Mild mitral valve regurgitation. There is no evidence of mitral valve vegetation. Tricuspid Valve: The tricuspid valve is grossly normal. Tricuspid valve regurgitation is mild. Aortic Valve: The aortic valve is grossly normal. Aortic valve regurgitation is trivial. There is no evidence of aortic valve vegetation. Pulmonic Valve: The pulmonic valve was grossly normal. Pulmonic valve regurgitation is trivial. There is no evidence of pulmonic valve vegetation. Aorta: The aortic root is normal in size and structure.  LEFT VENTRICLE PLAX 2D LVIDd:         4.82 cm LVIDs:  2.60 cm LV PW:         1.11 cm LV IVS:        0.95 cm LVOT diam:     2.20 cm LVOT Area:     3.80 cm  LEFT ATRIUM         Index LA diam:    4.10 cm 2.49 cm/m                        PULMONIC VALVE AORTA                 PV Vmax:        1.00 m/s Ao Root diam: 2.60 cm PV Peak grad:   4.0 mmHg                       RVOT Peak grad: 8 mmHg  TRICUSPID VALVE TR Peak grad:   38.7 mmHg TR Vmax:        311.00 cm/s  SHUNTS Systemic Diam: 2.20 cm Bartholome Bill MD Electronically signed by Bartholome Bill MD Signature Date/Time: 08/03/2019/11:20:44 AM    Final     Imaging: I personally reviewed her CT scan of her abdomen pelvis along with her abdominal plain film from this evening.  And I read the MRI reports  A/P: Danele Huisman is an 34 y.o. female with  Septic shock with MRSA bacteremia,  Iliopsoas and gluteal abscesses Osteomyelitis of sacrum and ischium Probable infection over right dorsal hand and right supraclavicular area Hypokalemia Bipolar disease Anxiety Depression IUD perforation of her uterus Small incisional umbilical hernia  Her lactate is normal.  There is no evidence of bowel wall thickening or distention or free air on her CT from yesterday.  There is no free fluid in the pelvis.  It appears that the IUD perforation may be chronic.  She does not have peritonitis on exam. I do not believe she needs  exploration or diagnostic laparoscopy  The small incisional umbilical hernia is quite small.  It is reducible.  We will defer to gynecology service as to management of the embedded IUD  She does have somewhat of a distended abdomen but there is no evidence of bowel distention on her plain film this evening.  It is possible that she could be getting a little bit of an ileus from all the retroperitoneal infection in the iliopsoas muscle; however, she is having flatus and had a bowel movement earlier today  She can have a clear liquid diet from my standpoint but will defer to orthopedics in case they are planning an invasive procedure  Defer to ortho service for mgmt of the osteo, iliopsoas muscle, right gluteus muscles infection   Leighton Ruff. Redmond Pulling, MD, FACS General, Bariatric, & Minimally Invasive Surgery [facility], [place]

## 2019-08-03 NOTE — Discharge Summary (Signed)
Leslie Molina [id number] DOB: [birth year] DOA: 08/02/2019  PCP: [facility] [place] date: 08/02/2019 Discharge date: 08/03/2019  Admitted From: Home Disposition:  To [facility]  Recommendations for Outpatient Follow-up:  1. Please follow up on the following pending results:labs, echo     Discharge Condition:Guarded CODE STATUS:Full  Diet recommendation: NPO Brief/Interim Summary: Per HPI:  Leslie Molina is a 34 y.o. female with medical history significant for bipolar disorder, anxiety, history of IV drug use presents to the emergency room twice within 1 week and lower back pain as well as worsening pain in her right lower extremity.  Was seen in the emergency room about a week ago and was discharged on gabapentin.  She was febrile during that visit had a Covid test done and was asked to quarantine until the results became available.  She returns to the emergency room today febrile with a T-max of 102F, she is also tachycardic and acutely ill-appearing.  She complains of worsening pain in her right extremity which she rated 10 out of 10 in intensity at its worse.  She denied having any trauma.  Denied having any sick contacts.  She denied having any cough, shortness of breath, nausea, vomiting, diaphoresis or palpitations.  She denied having any abdominal pain, no frequency of urination, dysuria or nocturia. She has not been able to bear weight on her right leg. She had an MRI of the right hip which showed extensive right iliopsoas abscess.  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  Myositis of the gluteus medius, maximus and minimus.  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  The abscess extends through the posterior aspect of the proximal right thigh IN the ER she was noted to be tachycardic and febrile with a T-max of 102 F, this am pt with Tmax 103, sinus tachycardic at 130. Feels pain, but mentation is good. Is receiving 2L NS. Echo completed and  pending. IR drained 3cc from abscess with cultures pending. ID was consulted, and were agreeable with current antibiotic regimen. Spoke to ICU attending Dr. Duwayne Heck about starting Levophed if no response to ivf. Spoke to Dr. Carlis Abbott at ICU at [facility] for transfer of patient for higher level of care.   Discharge Diagnoses:  Principal Problem:   Sepsis [facility]) Active Problems:   Iliopsoas abscess on right [facility])   Myositis   Hypokalemia    Discharge Instructions  Discharge Instructions    Diet - low sodium heart healthy   Complete by: As directed    Increase activity slowly   Complete by: As directed      Allergies as of 08/03/2019   No Known Allergies     Medication List    STOP taking these medications   ALPRAZolam 1 MG tablet Commonly known as: XANAX   butalbital-acetaminophen-caffeine 50-325-40 MG tablet Commonly known as: FIORICET   DULoxetine 30 MG capsule Commonly known as: CYMBALTA   gabapentin 300 MG capsule Commonly known as: NEURONTIN   hydrOXYzine 50 MG capsule Commonly known as: VISTARIL   Latuda 20 MG Tabs tablet Generic drug: lurasidone   ProAir HFA 108 (90 Base) MCG/ACT inhaler Generic drug: albuterol   Qvar 80 MCG/ACT inhaler Generic drug: beclomethasone   SEROquel XR 150 MG 24 hr tablet Generic drug: QUEtiapine Fumarate   simvastatin 40 MG tablet Commonly known as: ZOCOR   Spiriva HandiHaler 18 MCG inhalation capsule Generic drug: tiotropium   SUMAtriptan 50 MG tablet Commonly known as: IMITREX  topiramate 100 MG tablet Commonly known as: TOPAMAX   traMADol 50 MG tablet Commonly known as: ULTRAM   traZODone 150 MG tablet Commonly known as: DESYREL     TAKE these medications   acetaminophen 10 MG/ML Soln Commonly known as: OFIRMEV Inject 100 mLs (1,000 mg total) into the vein once for 1 dose.   acetaminophen 325 MG tablet Commonly known as: TYLENOL Take 2 tablets (650 mg total) by mouth every 6 (six) hours as needed for fever.    albumin human 25 % bottle Inject 100 mLs (25 g total) into the vein once for 1 dose.   ceFEPIme 2 g in sodium chloride 0.9 % 100 mL Inject 2 g into the vein every 8 (eight) hours.   Chlorhexidine Gluconate Cloth 2 % Pads Apply 6 each topically daily.   clindamycin 900 MG/50ML IVPB Commonly known as: CLEOCIN Inject 50 mLs (900 mg total) into the vein every 8 (eight) hours.   DAPTOmycin 500 mg in sodium chloride 0.9 % 100 mL Inject 500 mg into the vein now for 1 dose.   dextrose 5 % and 0.45% NaCl infusion Inject 150 mL/hr into the vein continuous.   enoxaparin 40 MG/0.4ML injection Commonly known as: LOVENOX Inject 0.4 mLs (40 mg total) into the skin daily.   norepinephrine 4-5 MG/250ML-% Soln Commonly known as: LEVOPHED Inject 0-40 mcg/min into the vein continuous.   oxyCODONE 5 MG immediate release tablet Commonly known as: Oxy IR/ROXICODONE Take 1 tablet (5 mg total) by mouth every 6 (six) hours as needed for breakthrough pain.   potassium chloride 10 MEQ/100ML Inject 100 mLs (10 mEq total) into the vein every 1 hour x 6 doses.   vancomycin 500 MG/100ML IVPB Commonly known as: VANCOREADY Inject 100 mLs (500 mg total) into the vein once for 1 dose.   vancomycin 750 MG/150ML Soln Commonly known as: VANCOREADY Inject 150 mLs (750 mg total) into the vein every 8 (eight) hours.       No Known Allergies  Consultations:  General surgery, ID, PCCM, IR   Procedures/Studies: DG Lumbar Spine 2-3 Views  Result Date: 08/02/2019 CLINICAL DATA:  Radicular back pain to the right lower extremity, no injury. EXAM: LUMBAR SPINE - 2-3 VIEW COMPARISON:  06/10/2013. FINDINGS: Very mild levoconvex curvature of the lumbar spine may be positional. Alignment is otherwise anatomic. Vertebral body and disc space height are normal. Mild facet sclerosis in the lower lumbar spine. Intrauterine contraceptive device is incidentally noted. IMPRESSION: Mild facet sclerosis at L5-S1.  Electronically Signed   By: Lorin Picket M.D.   On: 08/02/2019 09:59   CT Angio Chest PE W and/or Wo Contrast  Result Date: 08/02/2019 CLINICAL DATA:  Shortness of breath, severe back pain radiating down the right leg EXAM: CT ANGIOGRAPHY CHEST CT ABDOMEN AND PELVIS WITH CONTRAST TECHNIQUE: Multidetector CT imaging of the chest was performed using the standard protocol during bolus administration of intravenous contrast. Multiplanar CT image reconstructions and MIPs were obtained to evaluate the vascular anatomy. Multidetector CT imaging of the abdomen and pelvis was performed using the standard protocol during bolus administration of intravenous contrast. CONTRAST:  56mL OMNIPAQUE IOHEXOL 350 MG/ML SOLN COMPARISON:  None. FINDINGS: CTA CHEST FINDINGS Cardiovascular: Satisfactory opacification of the pulmonary arteries to the segmental level. No evidence of pulmonary embolism. Normal heart size. No pericardial effusion. Mediastinum/Nodes: No enlarged mediastinal, hilar, or axillary lymph nodes. Thyroid gland, trachea, and esophagus demonstrate no significant findings. Lungs/Pleura: Lungs are clear. No pleural effusion or pneumothorax. Musculoskeletal:  No chest wall abnormality. No acute or significant osseous findings. Review of the MIP images confirms the above findings. CT ABDOMEN and PELVIS FINDINGS Hepatobiliary: No focal liver abnormality is seen. No gallstones, gallbladder wall thickening, or biliary dilatation. Pancreas: Unremarkable. No pancreatic ductal dilatation or surrounding inflammatory changes. Spleen: Normal in size without focal abnormality. Adrenals/Urinary Tract: Adrenal glands are unremarkable. Kidneys are normal, without renal calculi, focal lesion, or hydronephrosis. Bladder is unremarkable. Stomach/Bowel: Stomach is within normal limits. Appendix appears normal. No evidence of bowel wall thickening, distention, or inflammatory changes. Vascular/Lymphatic: No significant vascular findings  are present. No enlarged abdominal or pelvic lymph nodes. Reproductive: No adnexal mass. T-type IUD within the uterus. A tip of 'T' has perforated through the uterine wall and projects into the peroneal cavity. Other: No abdominal wall hernia or abnormality. No abdominopelvic ascites. Musculoskeletal: No acute osseous abnormality. No aggressive osseous lesion. Vertebral body heights are maintained and are in normal anatomic alignment. No bone destruction or periosteal reaction. Mild osteoarthritis of bilateral SI joints. Heterogeneous enhancement and severe expansion of the right psoas and iliacus muscles with mild adjacent soft tissue edema. Review of the MIP images confirms the above findings. IMPRESSION: 1. Heterogeneous enhancement and severe expansion of the right psoas and iliacus muscles with mild adjacent soft tissue edema. Overall appearance is concerning for infectious myositis versus less likely muscle strain and intramuscular hemorrhage. Subtle small hypodensities within the iliopsoas muscle measuring up to 13 mm concerning for an intramuscular abscess. The lumbar spine demonstrates no focal abnormality to suggest discitis or osteomyelitis, but MRI of the lumbar spine without and with intravenous contrast is recommended for increased sensitivity if there is concern for discitis/osteomyelitis. 2. T-type IUD within the uterus. A tip of 'T' has perforated through the uterine wall and projects into the peroneal cavity. 3. No acute cardiopulmonary disease. Electronically Signed   By: Kathreen Devoid   On: 08/02/2019 12:09   CT GUIDED NEEDLE PLACEMENT  Result Date: 08/02/2019 INDICATION: History of intravenous drug abuse, now with right pelvic/retroperitoneal abscess. Please perform CT-guided aspiration and/or drainage catheter placement. EXAM: CT-GUIDED RIGHT ILIACUS MUSCULAR ABSCESS ASPIRATION COMPARISON:  CT abdomen pelvis-earlier same day; lumbar spine and right hip MRI-earlier same MEDICATIONS: None  ANESTHESIA/SEDATION: Moderate (conscious) sedation was employed during this procedure. A total of Versed 3 mg and Fentanyl 150 mcg was administered intravenously. Moderate Sedation Time: 34 minutes. The patient's level of consciousness and vital signs were monitored continuously by radiology nursing throughout the procedure under my direct supervision. CONTRAST:  None COMPLICATIONS: None immediate. PROCEDURE: Informed written consent was obtained from the patient after a discussion of the risks, benefits and alternatives to treatment. The patient was placed supine on the CT gantry and a pre procedural CT was performed re-demonstrating the known abscess/fluid collection within the right iliacus musculature with dominant ill-defined component measuring approximately 2.4 x 2.0 cm (image 60, series 2). The procedure was planned. A timeout was performed prior to the initiation of the procedure. The skin overlying the inferior anterior aspect of the right lower abdomen/pelvis was prepped and draped in the usual sterile fashion. The overlying soft tissues were anesthetized with 1% lidocaine with epinephrine. Appropriate trajectory was planned with the use of a 22 gauge spinal needle. An 18 gauge trocar needle was advanced into the abscess/fluid collection. Appropriate positioning was confirmed with a limited CT scan. Given the small size of the collection as well as lack of peripheral wall enhancement on preceding contrast-enhanced abdominal CT, the decision was made  to pursue aspiration at this time. Next, approximately 3 cc of purulent appearing fluid was aspirated as the trocar needle was slowly retracted. All aspirated fluid was capped and sent to the laboratory for analysis. A dressing was applied. The patient tolerated the procedure well without immediate post procedural complication. IMPRESSION: Successful CT guided aspiration of approximately 3 cc of purulent appearing fluid from right iliacus intramuscular  abscess, currently not amenable to image guided drainage catheter placement. All aspirated fluid was capped and sent to the laboratory as requested by the ordering clinical team. Electronically Signed   By: Sandi Mariscal M.D.   On: 08/02/2019 17:53   MR LUMBAR SPINE WO CONTRAST  Result Date: 08/02/2019 CLINICAL DATA:  Low back pain with radiculopathy. No recent injury EXAM: MRI LUMBAR SPINE WITHOUT CONTRAST TECHNIQUE: Multiplanar, multisequence MR imaging of the lumbar spine was performed. No intravenous contrast was administered. COMPARISON:  CT 08/02/2019 FINDINGS: Technical note: Examination is slightly degraded by patient motion artifact. Patient was unable to tolerate any further imaging and no postcontrast sequences were able to be performed. Segmentation:  Standard. Alignment:  Physiologic. Vertebrae:  No fracture, evidence of discitis, or bone lesion. Conus medullaris and cauda equina: Conus extends to the L2 level. Conus and cauda equina appear normal. Paraspinal and other soft tissues: Expansion and extensive fluid and edema signal within the right psoas muscle as well as the visualized portion of the right iliacus muscle. Fluid collection within the medial aspect of the right iliacus muscle measures up to 2.2 x 1.3 cm in transaxial dimension. The craniocaudal extent of the collection is not entirely included within the field of view. Fluid signal is seen within both the medial iliacus and or distal psoas muscle at the edge of the field of view which appears to extend from the right sacroiliac joint (series 7, image 36). There is patchy bone marrow edema within the right sacrum adjacent to the right SI joint. Visualized portion of the left psoas muscle is normal. The posterior paraspinal musculature is normal. The canal from the L4-5 level and caudally is slightly heterogeneous, which is felt to most likely be secondary to motion artifact and prominence of the epidural fat. No definite epidural fluid  collection. Disc levels: T12-L1: No significant disc protrusion, foraminal stenosis, or canal stenosis. L1-L2: No significant disc protrusion, foraminal stenosis, or canal stenosis. L2-L3: No significant disc protrusion, foraminal stenosis, or canal stenosis. L3-L4: No significant disc protrusion, foraminal stenosis, or canal stenosis. L4-L5: No significant disc protrusion, foraminal stenosis, or canal stenosis. L5-S1: No significant disc protrusion, foraminal stenosis, or canal stenosis. IMPRESSION: 1. Limited study due to patient motion artifact. Patient was unable to tolerate any postcontrast imaging. 2. 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 Please see dedicated MRI of the pelvis and right hip which more fully images the musculature for further detail. 3. Negative for osteomyelitis-discitis. 4. Slightly heterogeneous appearance of the distal lumbar canal below the L4-5 level on STIR sequences, which is favored to be secondary to a combination of prominent epidural fat and motion artifact. Postcontrast imaging would be helpful to exclude the presence of epidural extension of infection. 5. No significant disc disease. No evidence of nerve impingement. Electronically Signed   By: Davina Poke D.O.   On: 08/02/2019 13:40   CT ABDOMEN PELVIS W CONTRAST  Result Date: 08/02/2019 CLINICAL DATA:  Shortness of breath, severe back pain radiating down the right leg EXAM:  CT ANGIOGRAPHY CHEST CT ABDOMEN AND PELVIS WITH CONTRAST TECHNIQUE: Multidetector CT imaging of the chest was performed using the standard protocol during bolus administration of intravenous contrast. Multiplanar CT image reconstructions and MIPs were obtained to evaluate the vascular anatomy. Multidetector CT imaging of the abdomen and pelvis was performed using the standard protocol during bolus administration of intravenous  contrast. CONTRAST:  21mL OMNIPAQUE IOHEXOL 350 MG/ML SOLN COMPARISON:  None. FINDINGS: CTA CHEST FINDINGS Cardiovascular: Satisfactory opacification of the pulmonary arteries to the segmental level. No evidence of pulmonary embolism. Normal heart size. No pericardial effusion. Mediastinum/Nodes: No enlarged mediastinal, hilar, or axillary lymph nodes. Thyroid gland, trachea, and esophagus demonstrate no significant findings. Lungs/Pleura: Lungs are clear. No pleural effusion or pneumothorax. Musculoskeletal: No chest wall abnormality. No acute or significant osseous findings. Review of the MIP images confirms the above findings. CT ABDOMEN and PELVIS FINDINGS Hepatobiliary: No focal liver abnormality is seen. No gallstones, gallbladder wall thickening, or biliary dilatation. Pancreas: Unremarkable. No pancreatic ductal dilatation or surrounding inflammatory changes. Spleen: Normal in size without focal abnormality. Adrenals/Urinary Tract: Adrenal glands are unremarkable. Kidneys are normal, without renal calculi, focal lesion, or hydronephrosis. Bladder is unremarkable. Stomach/Bowel: Stomach is within normal limits. Appendix appears normal. No evidence of bowel wall thickening, distention, or inflammatory changes. Vascular/Lymphatic: No significant vascular findings are present. No enlarged abdominal or pelvic lymph nodes. Reproductive: No adnexal mass. T-type IUD within the uterus. A tip of 'T' has perforated through the uterine wall and projects into the peroneal cavity. Other: No abdominal wall hernia or abnormality. No abdominopelvic ascites. Musculoskeletal: No acute osseous abnormality. No aggressive osseous lesion. Vertebral body heights are maintained and are in normal anatomic alignment. No bone destruction or periosteal reaction. Mild osteoarthritis of bilateral SI joints. Heterogeneous enhancement and severe expansion of the right psoas and iliacus muscles with mild adjacent soft tissue edema. Review of  the MIP images confirms the above findings. IMPRESSION: 1. Heterogeneous enhancement and severe expansion of the right psoas and iliacus muscles with mild adjacent soft tissue edema. Overall appearance is concerning for infectious myositis versus less likely muscle strain and intramuscular hemorrhage. Subtle small hypodensities within the iliopsoas muscle measuring up to 13 mm concerning for an intramuscular abscess. The lumbar spine demonstrates no focal abnormality to suggest discitis or osteomyelitis, but MRI of the lumbar spine without and with intravenous contrast is recommended for increased sensitivity if there is concern for discitis/osteomyelitis. 2. T-type IUD within the uterus. A tip of 'T' has perforated through the uterine wall and projects into the peroneal cavity. 3. No acute cardiopulmonary disease. Electronically Signed   By: Kathreen Devoid   On: 08/02/2019 12:09   MR HIP RIGHT WO CONTRAST  Result Date: 08/02/2019 CLINICAL DATA:  Severe back pain radiating down the right hip and leg. Fever. EXAM: MR OF THE RIGHT HIP WITHOUT CONTRAST TECHNIQUE: Multiplanar, multisequence MR imaging was performed. No intravenous contrast was administered. COMPARISON:  CT scan of the abdomen and pelvis dated 08/02/2019 FINDINGS: Bones: There is abnormal edema in the right side of the sacrum and in the right iliac bone with fluid in the right SI joint, likely representing osteomyelitis and sepsis of the right sacroiliac joint. Articular cartilage and labrum Articular cartilage:  Normal. Labrum:  Normal. Joint or bursal effusion Joint effusion:  Trace joint effusion. Bursae: No bursitis. Muscles and tendons Muscles and tendons: Extensive right iliopsoas abscess extending from at least the L3 level to the insertion of the iliopsoas tendon on the lesser trochanter.  Is extensive edema in the iliopsoas muscle consistent with myositis. There is also edema in 1 of the right hip abductor muscles with fluid along the fascia of  the muscles in the anterior aspect of the proximal right thigh likely representing infectious fasciitis. There is also extension of abscess into the right gluteal muscles through the a posterior aspect of the right sacroiliac joint seen on images 1 and 2 of series 19. There is edema in the gluteus minimus, medius, and maximus muscles consistent with myositis. There is edema in the right piriformis muscle consistent with myositis seen on image 8 of series 19. Other findings Miscellaneous:   Distended urinary bladder. IMPRESSION: 1. Extensive right iliopsoas abscess extending from at least the L3-4 level to the insertion of the iliopsoas tendon on the lesser trochanter. 2. 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 3. Myositis of the right gluteus minimus, medius, and maximus and right piriformis muscles. 4. 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 5. Abscess extends through the posterior aspect of the right SI joint into the gluteal musculature. 6. Fluid along the fascial planes in the anterior aspect of the proximal right thigh likely representing infectious fasciitis. 7. MRI with contrast may better define the extent of the infection and provide a baseline for a serial evaluation. Electronically Signed   By: Lorriane Shire M.D.   On: 08/02/2019 13:45      Subjective: Pt feels pain in arm, around rib cage. NO sob. Or cp.  Discharge Exam: Vitals:   08/03/19 1000 08/03/19 1100  BP:  (!) 81/57  Pulse: (!) 125 (!) 118  Resp: (!) 28 (!) 32  Temp:  99.2 F (37.3 C)  SpO2: 96% 96%   Vitals:   08/03/19 0800 08/03/19 0900 08/03/19 1000 08/03/19 1100  BP: 94/77 (!) 87/42  (!) 81/57  Pulse: (!) 140 (!) 121 (!) 125 (!) 118  Resp: 18 (!) 24 (!) 28 (!) 32  Temp:  (!) 102.4 F (39.1 C)  99.2 F (37.3 C)  TempSrc:  Oral  Oral  SpO2: 95% 97% 96% 96%  Weight:      Height:        General: Pt is alert, awake, appears ill Cardiovascular: Regular tachy, s1/s2 2/6 sm   Respiratory: CTA bilaterally, no wheezing, no rhonchi Abdominal: Soft, NT, +distentioin, hyperactive bowel sounds + Extremities: B/l hands with blotches of redness, index finder with mild patchy red area . Mild edema b/l    The results of significant diagnostics from this hospitalization (including imaging, microbiology, ancillary and laboratory) are listed below for reference.     Microbiology: Recent Results (from the past 240 hour(s))  SARS CORONAVIRUS 2 (TAT 6-24 HRS) Nasopharyngeal Nasopharyngeal Swab     Status: None   Collection Time: 07/29/19 10:44 AM   Specimen: Nasopharyngeal Swab  Result Value Ref Range Status   SARS Coronavirus 2 NEGATIVE NEGATIVE Final    Comment: (NOTE) SARS-CoV-2 target nucleic acids are NOT DETECTED. The SARS-CoV-2 RNA is generally detectable in upper and lower respiratory specimens during the acute phase of infection. Negative results do not preclude SARS-CoV-2 infection, do not rule out co-infections with other pathogens, and should not be used as the sole basis for treatment or other patient management decisions. Negative results must be combined with clinical observations, patient history, and epidemiological information. The expected result is Negative. Fact Sheet for Patients: SugarRoll.be Fact Sheet for Healthcare Providers: https://www.[employer]ima[employer]-mathews.com/ This test is not yet approved or cleared by  the [employer] and  has been authorized for detection and/or diagnosis of SARS-CoV-2 by FDA under an Emergency Use Authorization (EUA). This EUA will remain  in effect (meaning this test can be used) for the duration of the COVID-19 declaration under Section 56 4(b)(1) of the Act, 21 U.S.C. section 360bbb-3(b)(1), unless the authorization is terminated or revoked sooner. Performed at [facility], [place] [address], [place], [place] [address]   Respiratory Panel by RT PCR (Flu A&B, Covid) -  Nasopharyngeal Swab     Status: None   Collection Time: 08/02/19 11:20 AM   Specimen: Nasopharyngeal Swab  Result Value Ref Range Status   SARS Coronavirus 2 by RT PCR NEGATIVE NEGATIVE Final    Comment: (NOTE) SARS-CoV-2 target nucleic acids are NOT DETECTED. The SARS-CoV-2 RNA is generally detectable in upper respiratoy specimens during the acute phase of infection. The lowest concentration of SARS-CoV-2 viral copies this assay can detect is 131 copies/mL. A negative result does not preclude SARS-Cov-2 infection and should not be used as the sole basis for treatment or other patient management decisions. A negative result may occur with  improper specimen collection/handling, submission of specimen other than nasopharyngeal swab, presence of viral mutation(s) within the areas targeted by this assay, and inadequate number of viral copies (<131 copies/mL). A negative result must be combined with clinical observations, patient history, and epidemiological information. The expected result is Negative. Fact Sheet for Patients:  PinkCheek.be Fact Sheet for Healthcare Providers:  GravelBags.it This test is not yet ap proved or cleared by the [place] FDA and  has been authorized for detection and/or diagnosis of SARS-CoV-2 by FDA under an Emergency Use Authorization (EUA). This EUA will remain  in effect (meaning this test can be used) for the duration of the COVID-19 declaration under Section 564(b)(1) of the Act, 21 U.S.C. section 360bbb-3(b)(1), unless the authorization is terminated or revoked sooner.    Influenza A by PCR NEGATIVE NEGATIVE Final   Influenza B by PCR NEGATIVE NEGATIVE Final    Comment: (NOTE) The Xpert Xpress SARS-CoV-2/FLU/RSV assay is intended as an aid in  the diagnosis of influenza from Nasopharyngeal swab specimens and  should not be used as a sole basis for treatment. Nasal washings and  aspirates  are unacceptable for Xpert Xpress SARS-CoV-2/FLU/RSV  testing. Fact Sheet for Patients: PinkCheek.be Fact Sheet for Healthcare Providers: GravelBags.it This test is not yet approved or cleared by the [place] FDA and  has been authorized for detection and/or diagnosis of SARS-CoV-2 by  FDA under an Emergency Use Authorization (EUA). This EUA will remain  in effect (meaning this test can be used) for the duration of the  Covid-19 declaration under Section 564(b)(1) of the Act, 21  U.S.C. section 360bbb-3(b)(1), unless the authorization is  terminated or revoked. Performed at [facility], [address] [place], [place] [address]   Blood culture (routine x 2)     Status: None (Preliminary result)   Collection Time: 08/02/19 11:20 AM   Specimen: BLOOD  Result Value Ref Range Status   Specimen Description BLOOD RIGHT ANTECUBITAL  Final   Special Requests   Final    BOTTLES DRAWN AEROBIC AND ANAEROBIC Blood Culture adequate volume   Culture  Setup Time   Final    IN BOTH AEROBIC AND ANAEROBIC BOTTLES GRAM POSITIVE COCCI CRITICAL RESULT CALLED TO, READ BACK BY AND VERIFIED WITH: SCOTT HALL @2022  08/02/19 [place] Performed at [facility], [address]  [place], [place] [address]    Culture GRAM POSITIVE COCCI  Final   Report Status PENDING  Incomplete  Blood culture (routine x 2)     Status: None (Preliminary result)   Collection Time: 08/02/19 11:20 AM   Specimen: BLOOD  Result Value Ref Range Status   Specimen Description   Final    BLOOD BLOOD RIGHT ARM Performed at [facility], [address] [place], [place] [address]    Special Requests   Final    BOTTLES DRAWN AEROBIC AND ANAEROBIC Blood Culture adequate volume Performed at [facility], [address] [place], [place] [address]    Culture  Setup Time   Final    IN BOTH AEROBIC AND ANAEROBIC BOTTLES GRAM POSITIVE  COCCI Organism ID to follow CRITICAL RESULT CALLED TO, READ BACK BY AND VERIFIED WITH: SCOTT HALL @0019  08/03/19 AKT Performed at [facility], [place] [address] [place], [place] [address]    Culture GRAM POSITIVE COCCI  Final   Report Status PENDING  Incomplete  Blood Culture ID Panel (Reflexed)     Status: Abnormal   Collection Time: 08/02/19 11:20 AM  Result Value Ref Range Status   Enterococcus species NOT DETECTED NOT DETECTED Final   Listeria monocytogenes NOT DETECTED NOT DETECTED Final   Staphylococcus species DETECTED (A) NOT DETECTED Final    Comment: CRITICAL RESULT CALLED TO, READ BACK BY AND VERIFIED WITH: SCOTT HALL @0019  08/03/19 AKT    Staphylococcus aureus (BCID) DETECTED (A) NOT DETECTED Final    Comment: Methicillin (oxacillin)-resistant Staphylococcus aureus (MRSA). MRSA is predictably resistant to beta-lactam antibiotics (except ceftaroline). Preferred therapy is vancomycin unless clinically contraindicated. Patient requires contact precautions if  hospitalized. CRITICAL RESULT CALLED TO, READ BACK BY AND VERIFIED WITH: SCOTT HALL @0019  08/03/19 AKT    Methicillin resistance DETECTED (A) NOT DETECTED Final    Comment: CRITICAL RESULT CALLED TO, READ BACK BY AND VERIFIED WITH: SCOTT HALL @0019  08/03/19 AKT    Streptococcus species NOT DETECTED NOT DETECTED Final   Streptococcus agalactiae NOT DETECTED NOT DETECTED Final   Streptococcus pneumoniae NOT DETECTED NOT DETECTED Final   Streptococcus pyogenes NOT DETECTED NOT DETECTED Final   Acinetobacter baumannii NOT DETECTED NOT DETECTED Final   Enterobacteriaceae species NOT DETECTED NOT DETECTED Final   Enterobacter cloacae complex NOT DETECTED NOT DETECTED Final   Escherichia coli NOT DETECTED NOT DETECTED Final   Klebsiella oxytoca NOT DETECTED NOT DETECTED Final   Klebsiella pneumoniae NOT DETECTED NOT DETECTED Final   Proteus species NOT DETECTED NOT DETECTED Final   Serratia marcescens NOT DETECTED NOT  DETECTED Final   Haemophilus influenzae NOT DETECTED NOT DETECTED Final   Neisseria meningitidis NOT DETECTED NOT DETECTED Final   Pseudomonas aeruginosa NOT DETECTED NOT DETECTED Final   Candida albicans NOT DETECTED NOT DETECTED Final   Candida glabrata NOT DETECTED NOT DETECTED Final   Candida krusei NOT DETECTED NOT DETECTED Final   Candida parapsilosis NOT DETECTED NOT DETECTED Final   Candida tropicalis NOT DETECTED NOT DETECTED Final    Comment: Performed at [facility], [address] [place], [place] [address]  Aerobic/Anaerobic Culture (surgical/deep wound)     Status: None (Preliminary result)   Collection Time: 08/02/19  5:29 PM   Specimen: Abscess  Result Value Ref Range Status   Specimen Description   Final    ABSCESS Performed at [facility], [address] [place], [place] [address]    Special Requests ILIACUS MUSCLE  Final  Gram Stain   Final    MODERATE WBC PRESENT,BOTH PMN AND MONONUCLEAR ABUNDANT GRAM POSITIVE COCCI IN PAIRS IN CLUSTERS Performed at [facility], [place] [address], [place], [place] [address]    Culture ABUNDANT STAPHYLOCOCCUS AUREUS  Final   Report Status PENDING  Incomplete  MRSA PCR Screening     Status: Abnormal   Collection Time: 08/02/19  8:24 PM   Specimen: Nasal Mucosa; Nasopharyngeal  Result Value Ref Range Status   MRSA by PCR POSITIVE (A) NEGATIVE Final    Comment:        The GeneXpert MRSA Assay (FDA approved for NASAL specimens only), is one component of a comprehensive MRSA colonization surveillance program. It is not intended to diagnose MRSA infection nor to guide or monitor treatment for MRSA infections. RESULT CALLED TO, READ BACK BY AND VERIFIED WITH: Gypsy Lore 08/02/19 @ 2143  [place] Performed at [facility], [address] [place], [place] [address]      Labs: BNP (last 3 results) No results for input(s): BNP in the last 8760 hours. Basic Metabolic Panel: Recent Labs  Lab  08/02/19 0843 08/03/19 0250  NA 128* 134*  K 3.0* 2.9*  CL 89* 104  CO2 24 25  GLUCOSE 133* 130*  BUN 11 15  CREATININE 0.60 0.50  CALCIUM 8.4* 7.6*  MG  --  2.3   Liver Function Tests: Recent Labs  Lab 08/02/19 0843 08/03/19 0250  AST 30 24  ALT 24 18  ALKPHOS 103 83  BILITOT 0.5 0.5  PROT 7.2 6.3*  ALBUMIN 2.9* 2.3*   No results for input(s): LIPASE, AMYLASE in the last 168 hours. No results for input(s): AMMONIA in the last 168 hours. CBC: Recent Labs  Lab 08/02/19 0843  WBC 14.0*  NEUTROABS 12.3*  HGB 11.8*  HCT 33.7*  MCV 82.0  PLT 304   Cardiac Enzymes: Recent Labs  Lab 08/02/19 0843  CKTOTAL 410*   BNP: Invalid input(s): POCBNP CBG: Recent Labs  Lab 08/02/19 2113  GLUCAP 127*   D-Dimer No results for input(s): DDIMER in the last 72 hours. Hgb A1c No results for input(s): HGBA1C in the last 72 hours. Lipid Profile No results for input(s): CHOL, HDL, LDLCALC, TRIG, CHOLHDL, LDLDIRECT in the last 72 hours. Thyroid function studies No results for input(s): TSH, T4TOTAL, T3FREE, THYROIDAB in the last 72 hours.  Invalid input(s): FREET3 Anemia work up No results for input(s): VITAMINB12, FOLATE, FERRITIN, TIBC, IRON, RETICCTPCT in the last 72 hours. Urinalysis    Component Value Date/Time   COLORURINE YELLOW (A) 08/02/2019 0917   APPEARANCEUR CLEAR (A) 08/02/2019 0917   APPEARANCEUR Clear 04/03/2014 0059   LABSPEC 1.008 08/02/2019 0917   LABSPEC 1.016 04/03/2014 0059   PHURINE 6.0 08/02/2019 0917   GLUCOSEU NEGATIVE 08/02/2019 0917   GLUCOSEU Negative 04/03/2014 0059   HGBUR NEGATIVE 08/02/2019 0917   BILIRUBINUR NEGATIVE 08/02/2019 0917   BILIRUBINUR Negative 04/03/2014 0059   KETONESUR NEGATIVE 08/02/2019 0917   PROTEINUR NEGATIVE 08/02/2019 0917   NITRITE NEGATIVE 08/02/2019 0917   LEUKOCYTESUR NEGATIVE 08/02/2019 0917   LEUKOCYTESUR Negative 04/03/2014 0059   Sepsis Labs Invalid input(s): PROCALCITONIN,  WBC,   LACTICIDVEN Microbiology Recent Results (from the past 240 hour(s))  SARS CORONAVIRUS 2 (TAT 6-24 HRS) Nasopharyngeal Nasopharyngeal Swab     Status: None   Collection Time: 07/29/19 10:44 AM   Specimen: Nasopharyngeal Swab  Result Value Ref Range Status   SARS Coronavirus 2 NEGATIVE NEGATIVE Final  Comment: (NOTE) SARS-CoV-2 target nucleic acids are NOT DETECTED. The SARS-CoV-2 RNA is generally detectable in upper and lower respiratory specimens during the acute phase of infection. Negative results do not preclude SARS-CoV-2 infection, do not rule out co-infections with other pathogens, and should not be used as the sole basis for treatment or other patient management decisions. Negative results must be combined with clinical observations, patient history, and epidemiological information. The expected result is Negative. Fact Sheet for Patients: SugarRoll.be Fact Sheet for Healthcare Providers: https://www.Simms-mathews.com/ This test is not yet approved or cleared by the [place] FDA and  has been authorized for detection and/or diagnosis of SARS-CoV-2 by FDA under an Emergency Use Authorization (EUA). This EUA will remain  in effect (meaning this test can be used) for the duration of the COVID-19 declaration under Section 56 4(b)(1) of the Act, 21 U.S.C. section 360bbb-3(b)(1), unless the authorization is terminated or revoked sooner. Performed at [facility], [place] [address] [place], [place] [address]   Respiratory Panel by RT PCR (Flu A&B, Covid) - Nasopharyngeal Swab     Status: None   Collection Time: 08/02/19 11:20 AM   Specimen: Nasopharyngeal Swab  Result Value Ref Range Status   SARS Coronavirus 2 by RT PCR NEGATIVE NEGATIVE Final    Comment: (NOTE) SARS-CoV-2 target nucleic acids are NOT DETECTED. The SARS-CoV-2 RNA is generally detectable in upper respiratoy specimens during the acute phase of infection. The  lowest concentration of SARS-CoV-2 viral copies this assay can detect is 131 copies/mL. A negative result does not preclude SARS-Cov-2 infection and should not be used as the sole basis for treatment or other patient management decisions. A negative result may occur with  improper specimen collection/handling, submission of specimen other than nasopharyngeal swab, presence of viral mutation(s) within the areas targeted by this assay, and inadequate number of viral copies (<131 copies/mL). A negative result must be combined with clinical observations, patient history, and epidemiological information. The expected result is Negative. Fact Sheet for Patients:  PinkCheek.be Fact Sheet for Healthcare Providers:  GravelBags.it This test is not yet ap proved or cleared by the [place] FDA and  has been authorized for detection and/or diagnosis of SARS-CoV-2 by FDA under an Emergency Use Authorization (EUA). This EUA will remain  in effect (meaning this test can be used) for the duration of the COVID-19 declaration under Section 564(b)(1) of the Act, 21 U.S.C. section 360bbb-3(b)(1), unless the authorization is terminated or revoked sooner.    Influenza A by PCR NEGATIVE NEGATIVE Final   Influenza B by PCR NEGATIVE NEGATIVE Final    Comment: (NOTE) The Xpert Xpress SARS-CoV-2/FLU/RSV assay is intended as an aid in  the diagnosis of influenza from Nasopharyngeal swab specimens and  should not be used as a sole basis for treatment. Nasal washings and  aspirates are unacceptable for Xpert Xpress SARS-CoV-2/FLU/RSV  testing. Fact Sheet for Patients: PinkCheek.be Fact Sheet for Healthcare Providers: GravelBags.it This test is not yet approved or cleared by the [place] FDA and  has been authorized for detection and/or diagnosis of SARS-CoV-2 by  FDA under an Emergency  Use Authorization (EUA). This EUA will remain  in effect (meaning this test can be used) for the duration of the  Covid-19 declaration under Section 564(b)(1) of the Act, 21  U.S.C. section 360bbb-3(b)(1), unless the authorization is  terminated or revoked. Performed at [facility] [address] [place], [place] [address]   Blood culture (routine x 2)  Status: None (Preliminary result)   Collection Time: 08/02/19 11:20 AM   Specimen: BLOOD  Result Value Ref Range Status   Specimen Description BLOOD RIGHT ANTECUBITAL  Final   Special Requests   Final    BOTTLES DRAWN AEROBIC AND ANAEROBIC Blood Culture adequate volume   Culture  Setup Time   Final    IN BOTH AEROBIC AND ANAEROBIC BOTTLES GRAM POSITIVE COCCI CRITICAL RESULT CALLED TO, READ BACK BY AND VERIFIED WITH: [place] @2022  08/02/19 [place] Performed at [facility], [address], [place], [place] [address]    Culture GRAM POSITIVE COCCI  Final   Report Status PENDING  Incomplete  Blood culture (routine x 2)     Status: None (Preliminary result)   Collection Time: 08/02/19 11:20 AM   Specimen: BLOOD  Result Value Ref Range Status   Specimen Description   Final    BLOOD BLOOD RIGHT ARM Performed at [facility], [address] [place], [place] [address]    Special Requests   Final    BOTTLES DRAWN AEROBIC AND ANAEROBIC Blood Culture adequate volume Performed at [facility], [address] [place], [place] [address]    Culture  Setup Time   Final    IN BOTH AEROBIC AND ANAEROBIC BOTTLES GRAM POSITIVE COCCI Organism ID to follow CRITICAL RESULT CALLED TO, READ BACK BY AND VERIFIED WITH: SCOTT HALL @0019  08/03/19 [place] Performed at [facility], [place] [address] [place], [place] [address]    Culture GRAM POSITIVE COCCI  Final   Report Status PENDING  Incomplete  Blood Culture ID Panel (Reflexed)     Status: Abnormal   Collection Time: 08/02/19 11:20 AM  Result Value Ref  Range Status   Enterococcus species NOT DETECTED NOT DETECTED Final   Listeria monocytogenes NOT DETECTED NOT DETECTED Final   Staphylococcus species DETECTED (A) NOT DETECTED Final    Comment: CRITICAL RESULT CALLED TO, READ BACK BY AND VERIFIED WITH: SCOTT HALL @0019  08/03/19 [place]    Staphylococcus aureus (BCID) DETECTED (A) NOT DETECTED Final    Comment: Methicillin (oxacillin)-resistant Staphylococcus aureus (MRSA). MRSA is predictably resistant to beta-lactam antibiotics (except ceftaroline). Preferred therapy is vancomycin unless clinically contraindicated. Patient requires contact precautions if  hospitalized. CRITICAL RESULT CALLED TO, READ BACK BY AND VERIFIED WITH: SCOTT HALL @0019  08/03/19 [place]    Methicillin resistance DETECTED (A) NOT DETECTED Final    Comment: CRITICAL RESULT CALLED TO, READ BACK BY AND VERIFIED WITH: SCOTT HALL @0019  08/03/19 [place]    Streptococcus species NOT DETECTED NOT DETECTED Final   Streptococcus agalactiae NOT DETECTED NOT DETECTED Final   Streptococcus pneumoniae NOT DETECTED NOT DETECTED Final   Streptococcus pyogenes NOT DETECTED NOT DETECTED Final   Acinetobacter baumannii NOT DETECTED NOT DETECTED Final   Enterobacteriaceae species NOT DETECTED NOT DETECTED Final   Enterobacter cloacae complex NOT DETECTED NOT DETECTED Final   Escherichia coli NOT DETECTED NOT DETECTED Final   Klebsiella oxytoca NOT DETECTED NOT DETECTED Final   Klebsiella pneumoniae NOT DETECTED NOT DETECTED Final   Proteus species NOT DETECTED NOT DETECTED Final   Serratia marcescens NOT DETECTED NOT DETECTED Final   Haemophilus influenzae NOT DETECTED NOT DETECTED Final   Neisseria meningitidis NOT DETECTED NOT DETECTED Final   Pseudomonas aeruginosa NOT DETECTED NOT DETECTED Final   Candida albicans NOT DETECTED NOT DETECTED Final   Candida glabrata NOT DETECTED NOT DETECTED Final   Candida krusei NOT DETECTED NOT DETECTED Final   Candida parapsilosis NOT  DETECTED NOT DETECTED  Final   Candida tropicalis NOT DETECTED NOT DETECTED Final    Comment: Performed at [facility], [address] [place], [place] [address]  Aerobic/Anaerobic Culture (surgical/deep wound)     Status: None (Preliminary result)   Collection Time: 08/02/19  5:29 PM   Specimen: Abscess  Result Value Ref Range Status   Specimen Description   Final    ABSCESS Performed at [facility], [address] [place], [place] [address]    Special Requests ILIACUS MUSCLE  Final   Gram Stain   Final    MODERATE WBC PRESENT,BOTH PMN AND MONONUCLEAR ABUNDANT GRAM POSITIVE COCCI IN PAIRS IN CLUSTERS Performed at [facility], [place] [address], [place], [place] [address]    Culture ABUNDANT STAPHYLOCOCCUS AUREUS  Final   Report Status PENDING  Incomplete  MRSA PCR Screening     Status: Abnormal   Collection Time: 08/02/19  8:24 PM   Specimen: Nasal Mucosa; Nasopharyngeal  Result Value Ref Range Status   MRSA by PCR POSITIVE (A) NEGATIVE Final    Comment:        The GeneXpert MRSA Assay (FDA approved for NASAL specimens only), is one component of a comprehensive MRSA colonization surveillance program. It is not intended to diagnose MRSA infection nor to guide or monitor treatment for MRSA infections. RESULT CALLED TO, READ BACK BY AND VERIFIED WITH: Gypsy Lore 08/02/19 @ 2143  [facility] Performed at [facility], [address], [place],  [address]      Time coordinating discharge: Over 30 minutes  SIGNED:   Nolberto Hanlon, MD  [facility] 08/03/2019, 11:17 AM Pager   If 7PM-7AM, please contact night-coverage [email or web address] Password [email or web address]

## 2019-08-03 NOTE — H&P (Signed)
PULMONARY / CRITICAL CARE MEDICINE   NAME:  Leslie Molina, MRN:  [id number], DOB:  [birth year], LOS: 0 ADMISSION DATE:  08/03/2019,  REFERRING MD:  Kurtis Bushman, MD, [facility], CHIEF COMPLAINT: MRSA bacteremia  BRIEF HISTORY:    34 year old woman, ex-IVDU presented to [facility] with complaints of lower back pain, abdominal pain and pain in her lower extremity.  Found to have MRSA bacteremia, imaging showing osteomyelitis of sacrum, psoas initial abscess and perforated uterus from IUD.  Hence transferred to [facility] for surgical/GYN input   HISTORY OF PRESENT ILLNESS   Leslie Molina is a 34 yo female with PMX significant forwho presented to the ED with complaints of persisted lower back pain and worsening right lower extremity pain. Per review of medical records patient had been seen in the ED twice the week prior to admission for similar complaints. Pain rated 10/10 at its worst. She denies any chest pain,. cough, shortness of breath, nausea, vomiting, or lower extremity edema .   On admission she was seen febrile with a Tmax of 102 and tachycardic. Labwork significant for sodium 128, potassium 3.0, Chloride 89, Lactic WNL, procalcitonin 5.99, and  WBC 14.0. CT chest/abdomen/plevis on admission concerning for infectious myositis. Patient was admitted to critical care for sepsis workup. She had CT guided need aspiration of abscess evening of admission.   Further work during admission with MRI right hip and lumbar spine revealed extensive myositis of the right iliopsoas muscle, myositis of the gluteus medius, maximus, and minimus as well as osteomyelitis of the right side of the sacrum and iliac bone. Patient was transferred to [facility] for higher level of care including surgical/GYN/ and orthopedic consultations Leslie Molina is a 34 yo female with PMX significant forwho presented to the ED with complaints of persisted lower back pain and worsening right lower extremity pain. Per review of medical records patient had been seen  in the ED twice the week prior to admission for similar complaints. Pain rated 10/10 at its worst. She denies any chest pain,. cough, shortness of breath, nausea, vomiting, or lower extremity edema .   On admission she was seen febrile with a Tmax of 102 and tachycardic. Labwork significant for sodium 128, potassium 3.0, Chloride 89, Lactic WNL, procalcitonin 5.99, and  WBC 14.0. CT chest/abdomen/plevis on admission concerning for infectious myositis. Patient was admitted to critical care for sepsis workup. She had CT guided need aspiration of abscess evening of admission.   Further work during admission with MRI right hip and lumbar spine revealed extensive myositis of the right iliopsoas muscle, myositis of the gluteus medius, maximus, and minimus as well as osteomyelitis of the right side of the sacrum and iliac bone. Patient was transferred to [facility] for higher level of care including surgical/GYN/ and orthopedic consultations   SIGNIFICANT PAST MEDICAL HISTORY   IVDU (has no used within the last year per patient) Migraines Depression Bipolar disease Anxiety   SIGNIFICANT EVENTS:  Admitted [facility] 3/3 Transferred [facility] 3/4 CT guided need aspiration of abscess 3/3  STUDIES:   Lumbar spine xray 3/3 > Mild facet sclerosis at L5-S1.  CT Chest/Abdomen/Plevis 3/3 >  1. Heterogeneous enhancement and severe expansion of the right psoas and iliacus muscles with mild adjacent soft tissue edema. Overall appearance is concerning for infectious myositis versus less likely muscle strain and intramuscular hemorrhage. Subtle small hypodensities within the iliopsoas muscle measuring up to 13 mm concerning for an intramuscular abscess. The lumbar spine demonstrates no focal abnormality to suggest discitis or osteomyelitis,  but MRI of the lumbar spine without and with intravenous contrast is recommended for increased sensitivity if there is concern for discitis/osteomyelitis. 2. T-type IUD within  the uterus. A tip of 'T' has perforated through the uterine wall and projects into the peroneal cavity. 3. No acute cardiopulmonary disease.  MRI right hip 3/3 WO contrast  >  1. Extensive right iliopsoas abscess extending from at least the L3-4 level to the insertion of the iliopsoas tendon on the lesser trochanter. 2. 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 3. Myositis of the right gluteus minimus, medius, and maximus and right piriformis muscles. 4. 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 5. Abscess extends through the posterior aspect of the right SI joint into the gluteal musculature. 6. Fluid along the fascial planes in the anterior aspect of the proximal right thigh likely representing infectious fasciitis. 7. MRI with contrast may better define the extent of the infection and provide a baseline for a serial evaluation.  MRI lumbar spine WO contrast 3/3 > 1. Limited study due to patient motion artifact. Patient was unable to tolerate any postcontrast imaging. 2. 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 Please see dedicated MRI of the pelvis and right hip which more fully images the musculature for further detail. 3. Negative for osteomyelitis-discitis. 4. Slightly heterogeneous appearance of the distal lumbar canal below the L4-5 level on STIR sequences, which is favored to be secondary to a combination of prominent epidural fat and motion artifact. Postcontrast imaging would be helpful to exclude the presence of epidural extension of infection. 5. No significant disc disease. No evidence of nerve impingement.  MR lumbar spine W contrast 3/4 > 1. 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 2. Extensive right iliopsoas abscess. 3. Edema and enhancement of the right gluteal  muscles and right multifidus muscle at the L5-S1 level consistent with myositis. 4. No evidence of epidural extension of abscess at this time. CULTURES:  COVID 3/3 > negative  MRSA PCR 3/3 > Positive  Blood culture 3/3 >  MRSA  Pelvic abcess culture 3/3 > Abundant Staph aures, full result pending   ANTIBIOTICS:  Daptomycin 3/3 Cefepime 3/3 Clindamycin 3/3  LINES/TUBES:   [facility] 3/4 >>  CONSULTANTS:  Gyn Ortho  SUBJECTIVE:  Complains of pain at different sites-right neck, chest wall, buttocks, abdomen  CONSTITUTIONAL: LMP  (LMP Unknown)   No intake/output data recorded.        PHYSICAL EXAM: General: Young woman, in mild distress due to pain, able to lie supine Neuro: Alert, interactive, nonfocal, no meningeal signs HEENT: No JVD, no icterus or pallor, pupils 3 mm RTL, focal tenderness over right sternomastoid Cardiovascular: S1-S2 tacky, no murmur Lungs: Clear breath sounds bilateral Abdomen: Soft, distended, diffuse tenderness, no guarding Musculoskeletal: Tender swelling over dorsum of right hand, Skin: Psoriatic patches over both legs, multiple tattoos  Labs from [facility] reviewed from this morning show hypokalemia, low albumin, high procalcitonin, HIV negative  RESOLVED PROBLEM LIST   ASSESSMENT AND PLAN   Source of MRSA is not clear, infected IUD is the main concern, she denies ongoing IVDU, has been clean "for a year"   Sepsis shock with MRSA bacteremia  -Criteria: Febrile with Tmax 103, tachycardia, tachypnea, leukocytosis, and confirmed infection  -Has seeded multiple areas including osteomyelitis and iliopsoas and gluteal abscess  P: Admit ICU Supplemental oxygen Continue Vancomycin and daptomycin  IV hydration Will need TEE ID consult in a.m.   Iliopsoas and gluteal abscesses Osteomyelitis of sacrum and ischium Possible infection over R hand dorsum and R Sternoclav joint -Seen on MRI right hip and lumbar spine  P: ID following  Orthopedic  consult  Will need imaging of Right hand and shoulder -defer to Ortho  Peritonitis  IUD perf noted on CT P: Consulted GYN / surgery Npo for now Morphine as needed and IV Tylenol every 6 hours for pain  Hypokalemia  P: Supplement as needed Trend Bmet  Bipolar disease  Anxiety Depression P: Hold home Cymbalta, Seroquel, Trazadone  SUMMARY OF TODAY'S PLAN:  Awaiting consultation from different services, concern today is for peritonitis  Best Practice / Goals of Care / Disposition.   DVT PROPHYLAXIS: Lovenox SUP: Protonix NUTRITION: N.p.o. for now MOBILITY: Bedrest GOALS OF CARE: N/A FAMILY DISCUSSIONS: N/A DISPOSITION ICU  LABS  Glucose Recent Labs  Lab 08/02/19 2113  GLUCAP 127*    BMET Recent Labs  Lab 08/02/19 0843 08/03/19 0250  NA 128* 134*  K 3.0* 2.9*  CL 89* 104  CO2 24 25  BUN 11 15  CREATININE 0.60 0.50  GLUCOSE 133* 130*    Liver Enzymes Recent Labs  Lab 08/02/19 0843 08/03/19 0250  AST 30 24  ALT 24 18  ALKPHOS 103 83  BILITOT 0.5 0.5  ALBUMIN 2.9* 2.3*    Electrolytes Recent Labs  Lab 08/02/19 0843 08/03/19 0250  CALCIUM 8.4* 7.6*  MG  --  2.3    CBC Recent Labs  Lab 08/02/19 0843  WBC 14.0*  HGB 11.8*  HCT 33.7*  PLT 304    ABG No results for input(s): PHART, PCO2ART, PO2ART in the last 168 hours.  Coag's Recent Labs  Lab 08/03/19 0250  INR 1.3*    Sepsis Markers Recent Labs  Lab 08/02/19 1130 08/03/19 0250  LATICACIDVEN 1.4  --   PROCALCITON  --  5.99    Cardiac Enzymes No results for input(s): TROPONINI, PROBNP in the last 168 hours.  PAST MEDICAL HISTORY :   She  has a past medical history of Anxiety, Bipolar 1 disorder ([place]), Depression, Migraine, and Nerve damage.  PAST SURGICAL HISTORY:  She  has a past surgical history that includes Cesarean section.  No Known Allergies  No current facility-administered medications on file prior to encounter.   Current Outpatient Medications on  File Prior to Encounter  Medication Sig  . acetaminophen (OFIRMEV) 10 MG/ML SOLN Inject 100 mLs (1,000 mg total) into the vein once for 1 dose.  Marland Kitchen acetaminophen (TYLENOL) 325 MG tablet Take 2 tablets (650 mg total) by mouth every 6 (six) hours as needed for fever.  Marland Kitchen albumin human 25 % bottle Inject 100 mLs (25 g total) into the vein once for 1 dose.  . ceFEPIme 2 g in sodium chloride 0.9 % 100 mL Inject 2 g into the vein every 8 (eight) hours.  . Chlorhexidine Gluconate Cloth 2 % PADS Apply 6 each topically daily.  . clindamycin (CLEOCIN) 900 MG/50ML IVPB Inject 50 mLs (900 mg total) into the vein every 8 (eight) hours.  Marland Kitchen DAPTOmycin 500 mg in sodium chloride 0.9 % 100 mL Inject 500 mg into the vein now for 1 dose.  Marland Kitchen Dextrose-Sodium Chloride (DEXTROSE 5 % AND 0.45% NACL) infusion Inject 150 mL/hr into the vein continuous.  Marland Kitchen enoxaparin (LOVENOX) 40 MG/0.4ML injection Inject 0.4 mLs (40 mg total) into the skin daily.  . norepinephrine (LEVOPHED) 4-5  MG/250ML-% SOLN Inject 0-40 mcg/min into the vein continuous.  Marland Kitchen oxyCODONE (OXY IR/ROXICODONE) 5 MG immediate release tablet Take 1 tablet (5 mg total) by mouth every 6 (six) hours as needed for breakthrough pain.  . potassium chloride 10 MEQ/100ML Inject 100 mLs (10 mEq total) into the vein every 1 hour x 6 doses.  . vancomycin (VANCOREADY) 500 MG/100ML IVPB Inject 100 mLs (500 mg total) into the vein once for 1 dose.  . vancomycin (VANCOREADY) 750 MG/150ML SOLN Inject 150 mLs (750 mg total) into the vein every 8 (eight) hours.    FAMILY HISTORY:   Her family history includes Bipolar disorder in her father, mother, and sister; Depression in her father, mother, and sister.  SOCIAL HISTORY:  She  reports that she has been smoking cigarettes. She has been smoking about 1.00 pack per day. She has never used smokeless tobacco. She reports that she does not drink alcohol or use drugs.  REVIEW OF SYSTEMS:    Pain in right shoulder right neck, right  chest, right buttock, abdomen No dyspnea, cough or hemoptysis No palpitations, orthopnea, edema No nausea, vomiting    The patient is critically ill with multiple organ systems failure and requires high complexity decision making for assessment and support, frequent evaluation and titration of therapies, application of advanced monitoring technologies and extensive interpretation of multiple databases. Critical Care Time devoted to patient care services described in this note independent of APP/resident  time is 60 minutes.  Care coordinated with different services-GYN, general surgery and orthopedics   Kara Mead MD. FCCP. [facility]  If no response to pager , please call [phone]   08/03/2019

## 2019-08-04 DIAGNOSIS — R7881 Bacteremia: Secondary | ICD-10-CM | POA: Diagnosis present

## 2019-08-04 DIAGNOSIS — M898X1 Other specified disorders of bone, shoulder: Secondary | ICD-10-CM | POA: Diagnosis present

## 2019-08-04 DIAGNOSIS — B9562 Methicillin resistant Staphylococcus aureus infection as the cause of diseases classified elsewhere: Secondary | ICD-10-CM | POA: Diagnosis present

## 2019-08-04 DIAGNOSIS — M79641 Pain in right hand: Secondary | ICD-10-CM | POA: Diagnosis present

## 2019-08-04 DIAGNOSIS — L0291 Cutaneous abscess, unspecified: Secondary | ICD-10-CM

## 2019-08-04 LAB — BASIC METABOLIC PANEL
Anion gap: 11 (ref 5–15)
BUN: 5 mg/dL — ABNORMAL LOW (ref 6–20)
CO2: 23 mmol/L (ref 22–32)
Calcium: 7.7 mg/dL — ABNORMAL LOW (ref 8.9–10.3)
Chloride: 102 mmol/L (ref 98–111)
Creatinine, Ser: 0.54 mg/dL (ref 0.44–1.00)
GFR calc Af Amer: >60 mL/min (ref >60)
GFR calc non Af Amer: >60 mL/min (ref >60)
Glucose, Bld: 111 mg/dL — ABNORMAL HIGH (ref 70–99)
Potassium: 2.8 mmol/L — ABNORMAL LOW (ref 3.5–5.1)
Sodium: 136 mmol/L (ref 135–145)

## 2019-08-04 LAB — CBC
HCT: 29.3 % — ABNORMAL LOW (ref 36.0–46.0)
Hemoglobin: 9.7 g/dL — ABNORMAL LOW (ref 12.0–15.0)
MCH: 28 pg (ref 26.0–34.0)
MCHC: 33.1 g/dL (ref 30.0–36.0)
MCV: 84.7 fL (ref 80.0–100.0)
Platelets: 303 10*3/uL (ref 150–400)
RBC: 3.46 MIL/uL — ABNORMAL LOW (ref 3.87–5.11)
RDW: 14.6 % (ref 11.5–15.5)
WBC: 13 10*3/uL — ABNORMAL HIGH (ref 4.0–10.5)
nRBC: 0 % (ref 0.0–0.2)

## 2019-08-04 LAB — CK: Total CK: 104 U/L (ref 38–234)

## 2019-08-04 MED ORDER — CHLORHEXIDINE GLUCONATE CLOTH 2 % EX PADS
6.0000 | MEDICATED_PAD | Freq: Every day | CUTANEOUS | Status: DC
  Administered 2019-08-05 – 2019-09-02 (×28): 6 via TOPICAL

## 2019-08-04 MED ORDER — SODIUM CHLORIDE 0.9% FLUSH: 10.0000 mL | INTRAVENOUS | Status: DC | PRN

## 2019-08-04 MED ORDER — ACETAMINOPHEN 325 MG PO TABS
650.0000 mg | ORAL_TABLET | Freq: Four times a day (QID) | ORAL | Status: DC | PRN
  Administered 2019-08-04 – 2019-09-02 (×27): 650 mg via ORAL
  Filled 2019-08-04 (×29): qty 2

## 2019-08-04 MED ORDER — DOCUSATE SODIUM 100 MG PO CAPS
100.0000 mg | ORAL_CAPSULE | Freq: Two times a day (BID) | ORAL | Status: DC
  Administered 2019-08-04 – 2019-09-02 (×55): 100 mg via ORAL
  Filled 2019-08-04 (×60): qty 1

## 2019-08-04 MED ORDER — POTASSIUM CHLORIDE 10 MEQ/50ML IV SOLN
10.0000 meq | INTRAVENOUS | Status: AC
  Administered 2019-08-04 (×3): 10 meq via INTRAVENOUS
  Filled 2019-08-04 (×3): qty 50

## 2019-08-04 MED ORDER — FENTANYL CITRATE (PF) 100 MCG/2ML IJ SOLN
25.0000 ug | INTRAMUSCULAR | Status: DC | PRN
  Administered 2019-08-04: 25 ug via INTRAVENOUS
  Administered 2019-08-04 – 2019-08-07 (×14): 50 ug via INTRAVENOUS
  Administered 2019-08-07: 25 ug via INTRAVENOUS
  Administered 2019-08-07: 50 ug via INTRAVENOUS
  Administered 2019-08-07: 25 ug via INTRAVENOUS
  Administered 2019-08-08: 50 ug via INTRAVENOUS
  Filled 2019-08-04 (×20): qty 2

## 2019-08-04 MED ORDER — OXYCODONE HCL 5 MG PO TABS
5.0000 mg | ORAL_TABLET | ORAL | Status: DC | PRN
  Administered 2019-08-04 – 2019-08-06 (×13): 10 mg via ORAL
  Administered 2019-08-07: 5 mg via ORAL
  Administered 2019-08-07 (×2): 10 mg via ORAL
  Administered 2019-08-07: 5 mg via ORAL
  Administered 2019-08-07 – 2019-08-11 (×20): 10 mg via ORAL
  Administered 2019-08-12: 5 mg via ORAL
  Administered 2019-08-12 (×2): 10 mg via ORAL
  Filled 2019-08-04 (×21): qty 2
  Filled 2019-08-04: qty 1
  Filled 2019-08-04 (×8): qty 2
  Filled 2019-08-04: qty 1
  Filled 2019-08-04 (×2): qty 2
  Filled 2019-08-04: qty 1
  Filled 2019-08-04 (×7): qty 2

## 2019-08-04 MED ORDER — IBUPROFEN 200 MG PO TABS
400.0000 mg | ORAL_TABLET | Freq: Once | ORAL | Status: AC
  Administered 2019-08-04: 400 mg via ORAL
  Filled 2019-08-04: qty 2

## 2019-08-04 MED ORDER — POTASSIUM CHLORIDE CRYS ER 20 MEQ PO TBCR
40.0000 meq | EXTENDED_RELEASE_TABLET | Freq: Two times a day (BID) | ORAL | Status: AC
  Administered 2019-08-04 (×2): 40 meq via ORAL
  Filled 2019-08-04 (×2): qty 2

## 2019-08-04 MED ORDER — SODIUM CHLORIDE 0.9% FLUSH
10.0000 mL | Freq: Two times a day (BID) | INTRAVENOUS | Status: DC
  Administered 2019-08-04 – 2019-08-06 (×6): 10 mL
  Administered 2019-08-07: 30 mL
  Administered 2019-08-08 – 2019-08-16 (×12): 10 mL
  Administered 2019-08-16: 20 mL
  Administered 2019-08-17: 10 mL
  Administered 2019-08-18 – 2019-08-19 (×3): 20 mL
  Administered 2019-08-19 – 2019-08-25 (×11): 10 mL
  Administered 2019-08-25: 20 mL
  Administered 2019-08-26 – 2019-09-02 (×12): 10 mL

## 2019-08-04 NOTE — Progress Notes (Signed)
[facility] Progress Note     Subjective: CC-  Uncomfortable this morning. Complaining mostly of severe hip pain. States that it is difficult to distinguish between hip and abdominal pain. She does feel bloated. Denies nausea, vomiting. Passing flatus. BM yesterday. States that she is starving.  Objective: Vital signs in last 24 hours: Temp:  [98.4 F (36.9 C)-102.6 F (39.2 C)] 102.6 F (39.2 C) (03/05 0800) Pulse Rate:  [101-142] 121 (03/05 0900) Resp:  [16-32] 27 (03/05 0900) BP: (77-118)/(47-102) 104/61 (03/05 0900) SpO2:  [90 %-100 %] 91 % (03/05 0900) Last BM Date: (PTA)  Intake/Output from previous day: 03/04 0701 - 03/05 0700 In: 1333.4 [I.V.:549; IV Piggyback:784.4] Out: 1900 [Urine:1900] Intake/Output this shift: Total I/O In: 75.4 [I.V.:75.4] Out: -   PE: Gen:  Alert, NAD HEENT: EOM's intact, pupils equal and round Card:  tachy, no M/G/R heard, 2+ DP pulses Pulm:  CTAB, no W/R/R, rate and effort normal Abd: Soft, distedned, +BS, no HSM, soft/reducible umbilical hernia, mild upper abdominal TTP without rebound or guarding/ no peritonitis Ext:  calves soft and nontender Psych: A&Ox4  Skin: warm and dry  Lab Results:  Recent Labs    08/03/19 1847 08/04/19 0500  WBC 12.1* 13.0*  HGB 9.7* 9.7*  HCT 28.3* 29.3*  PLT 280 303   BMET Recent Labs    08/03/19 1847 08/04/19 0500  NA 134* 136  K 2.9* 2.8*  CL 102 102  CO2 22 23  GLUCOSE 121* 111*  BUN 6 5*  CREATININE 0.55 0.54  CALCIUM 7.6* 7.7*   PT/INR Recent Labs    08/03/19 0250  LABPROT 16.2*  INR 1.3*   CMP     Component Value Date/Time   NA 136 08/04/2019 0500   NA 140 04/03/2014 0059   K 2.8 (L) 08/04/2019 0500   K 3.6 04/03/2014 0059   CL 102 08/04/2019 0500   CL 106 04/03/2014 0059   CO2 23 08/04/2019 0500   CO2 26 04/03/2014 0059   GLUCOSE 111 (H) 08/04/2019 0500   GLUCOSE 105 (H) 04/03/2014 0059   BUN 5 (L) 08/04/2019 0500   BUN 7 04/03/2014 0059   CREATININE  0.54 08/04/2019 0500   CREATININE 0.75 04/03/2014 0059   CALCIUM 7.7 (L) 08/04/2019 0500   CALCIUM 8.5 04/03/2014 0059   PROT 6.3 (L) 08/03/2019 0250   PROT 7.8 04/03/2014 0059   ALBUMIN 2.3 (L) 08/03/2019 0250   ALBUMIN 4.1 04/03/2014 0059   AST 24 08/03/2019 0250   AST 24 04/03/2014 0059   ALT 18 08/03/2019 0250   ALT 41 04/03/2014 0059   ALKPHOS 83 08/03/2019 0250   ALKPHOS 113 04/03/2014 0059   BILITOT 0.5 08/03/2019 0250   BILITOT 0.3 04/03/2014 0059   GFRNONAA >60 08/04/2019 0500   GFRNONAA >60 04/03/2014 0059   GFRAA >60 08/04/2019 0500   GFRAA >60 04/03/2014 0059   Lipase  No results found for: LIPASE     Studies/Results: DG Shoulder 1V Right  Result Date: 08/03/2019 CLINICAL DATA:  Abscess. EXAM: RIGHT SHOULDER - 1 VIEW COMPARISON:  Chest CT yesterday. FINDINGS: There is no evidence of fracture or dislocation on single view. Overlying monitoring device projects over the acromioclavicular joint limiting assessment. No bony destruction or periosteal reaction. Mild soft tissue edema. No soft tissue air or radiopaque foreign body. IMPRESSION: Mild soft tissue edema. No acute osseous abnormality. Electronically Signed   By: Keith Rake M.D.   On: 08/03/2019 19:40   DG Abd 1 View  Result Date: 08/03/2019 CLINICAL DATA:  Peritonitis. EXAM: ABDOMEN - 1 VIEW COMPARISON:  Abdominal CT yesterday. FINDINGS: Left aspect of the abdomen not included in the field of view. Air-filled small and large bowel in a nonobstructive pattern. No evidence of free air. Intrauterine device in the left pelvis. IMPRESSION: Nonobstructive bowel gas pattern, no evidence of free air. Left aspect of the abdomen not included in the field of view. Electronically Signed   By: Keith Rake M.D.   On: 08/03/2019 19:41   CT Angio Chest PE W and/or Wo Contrast  Addendum Date: 08/03/2019   ADDENDUM REPORT: 08/03/2019 12:49 ADDENDUM: Not mentioned above: Thickening of the intercostal muscle between the  right anterior first and second ribs with adjacent pleural reaction and soft tissue edema in the adjacent subcutaneous fat likely reflecting an inflammatory process given the patient's abnormalities along the right iliopsoas muscles and right sacroiliitis. No drainable fluid collection. These findings were discussed with Dr. Patsey Berthold. Electronically Signed   By: Kathreen Devoid   On: 08/03/2019 12:49   Result Date: 08/03/2019 CLINICAL DATA:  Shortness of breath, severe back pain radiating down the right leg EXAM: CT ANGIOGRAPHY CHEST CT ABDOMEN AND PELVIS WITH CONTRAST TECHNIQUE: Multidetector CT imaging of the chest was performed using the standard protocol during bolus administration of intravenous contrast. Multiplanar CT image reconstructions and MIPs were obtained to evaluate the vascular anatomy. Multidetector CT imaging of the abdomen and pelvis was performed using the standard protocol during bolus administration of intravenous contrast. CONTRAST:  51mL OMNIPAQUE IOHEXOL 350 MG/ML SOLN COMPARISON:  None. FINDINGS: CTA CHEST FINDINGS Cardiovascular: Satisfactory opacification of the pulmonary arteries to the segmental level. No evidence of pulmonary embolism. Normal heart size. No pericardial effusion. Mediastinum/Nodes: No enlarged mediastinal, hilar, or axillary lymph nodes. Thyroid gland, trachea, and esophagus demonstrate no significant findings. Lungs/Pleura: Lungs are clear. No pleural effusion or pneumothorax. Musculoskeletal: No chest wall abnormality. No acute or significant osseous findings. Review of the MIP images confirms the above findings. CT ABDOMEN and PELVIS FINDINGS Hepatobiliary: No focal liver abnormality is seen. No gallstones, gallbladder wall thickening, or biliary dilatation. Pancreas: Unremarkable. No pancreatic ductal dilatation or surrounding inflammatory changes. Spleen: Normal in size without focal abnormality. Adrenals/Urinary Tract: Adrenal glands are unremarkable. Kidneys are  normal, without renal calculi, focal lesion, or hydronephrosis. Bladder is unremarkable. Stomach/Bowel: Stomach is within normal limits. Appendix appears normal. No evidence of bowel wall thickening, distention, or inflammatory changes. Vascular/Lymphatic: No significant vascular findings are present. No enlarged abdominal or pelvic lymph nodes. Reproductive: No adnexal mass. T-type IUD within the uterus. A tip of 'T' has perforated through the uterine wall and projects into the peroneal cavity. Other: No abdominal wall hernia or abnormality. No abdominopelvic ascites. Musculoskeletal: No acute osseous abnormality. No aggressive osseous lesion. Vertebral body heights are maintained and are in normal anatomic alignment. No bone destruction or periosteal reaction. Mild osteoarthritis of bilateral SI joints. Heterogeneous enhancement and severe expansion of the right psoas and iliacus muscles with mild adjacent soft tissue edema. Review of the MIP images confirms the above findings. IMPRESSION: 1. Heterogeneous enhancement and severe expansion of the right psoas and iliacus muscles with mild adjacent soft tissue edema. Overall appearance is concerning for infectious myositis versus less likely muscle strain and intramuscular hemorrhage. Subtle small hypodensities within the iliopsoas muscle measuring up to 13 mm concerning for an intramuscular abscess. The lumbar spine demonstrates no focal abnormality to suggest discitis or osteomyelitis, but MRI of the lumbar spine without and with  intravenous contrast is recommended for increased sensitivity if there is concern for discitis/osteomyelitis. 2. T-type IUD within the uterus. A tip of 'T' has perforated through the uterine wall and projects into the peroneal cavity. 3. No acute cardiopulmonary disease. Electronically Signed: By: Kathreen Devoid On: 08/02/2019 12:09   CT GUIDED NEEDLE PLACEMENT  Result Date: 08/02/2019 INDICATION: History of intravenous drug abuse, now  with right pelvic/retroperitoneal abscess. Please perform CT-guided aspiration and/or drainage catheter placement. EXAM: CT-GUIDED RIGHT ILIACUS MUSCULAR ABSCESS ASPIRATION COMPARISON:  CT abdomen pelvis-earlier same day; lumbar spine and right hip MRI-earlier same MEDICATIONS: None ANESTHESIA/SEDATION: Moderate (conscious) sedation was employed during this procedure. A total of Versed 3 mg and Fentanyl 150 mcg was administered intravenously. Moderate Sedation Time: 34 minutes. The patient's level of consciousness and vital signs were monitored continuously by radiology nursing throughout the procedure under my direct supervision. CONTRAST:  None COMPLICATIONS: None immediate. PROCEDURE: Informed written consent was obtained from the patient after a discussion of the risks, benefits and alternatives to treatment. The patient was placed supine on the CT gantry and a pre procedural CT was performed re-demonstrating the known abscess/fluid collection within the right iliacus musculature with dominant ill-defined component measuring approximately 2.4 x 2.0 cm (image 60, series 2). The procedure was planned. A timeout was performed prior to the initiation of the procedure. The skin overlying the inferior anterior aspect of the right lower abdomen/pelvis was prepped and draped in the usual sterile fashion. The overlying soft tissues were anesthetized with 1% lidocaine with epinephrine. Appropriate trajectory was planned with the use of a 22 gauge spinal needle. An 18 gauge trocar needle was advanced into the abscess/fluid collection. Appropriate positioning was confirmed with a limited CT scan. Given the small size of the collection as well as lack of peripheral wall enhancement on preceding contrast-enhanced abdominal CT, the decision was made to pursue aspiration at this time. Next, approximately 3 cc of purulent appearing fluid was aspirated as the trocar needle was slowly retracted. All aspirated fluid was capped and  sent to the laboratory for analysis. A dressing was applied. The patient tolerated the procedure well without immediate post procedural complication. IMPRESSION: Successful CT guided aspiration of approximately 3 cc of purulent appearing fluid from right iliacus intramuscular abscess, currently not amenable to image guided drainage catheter placement. All aspirated fluid was capped and sent to the laboratory as requested by the ordering clinical team. Electronically Signed   By: Sandi Mariscal M.D.   On: 08/02/2019 17:53   MR LUMBAR SPINE WO CONTRAST  Result Date: 08/02/2019 CLINICAL DATA:  Low back pain with radiculopathy. No recent injury EXAM: MRI LUMBAR SPINE WITHOUT CONTRAST TECHNIQUE: Multiplanar, multisequence MR imaging of the lumbar spine was performed. No intravenous contrast was administered. COMPARISON:  CT 08/02/2019 FINDINGS: Technical note: Examination is slightly degraded by patient motion artifact. Patient was unable to tolerate any further imaging and no postcontrast sequences were able to be performed. Segmentation:  Standard. Alignment:  Physiologic. Vertebrae:  No fracture, evidence of discitis, or bone lesion. Conus medullaris and cauda equina: Conus extends to the L2 level. Conus and cauda equina appear normal. Paraspinal and other soft tissues: Expansion and extensive fluid and edema signal within the right psoas muscle as well as the visualized portion of the right iliacus muscle. Fluid collection within the medial aspect of the right iliacus muscle measures up to 2.2 x 1.3 cm in transaxial dimension. The craniocaudal extent of the collection is not entirely included within the field  of view. Fluid signal is seen within both the medial iliacus and or distal psoas muscle at the edge of the field of view which appears to extend from the right sacroiliac joint (series 7, image 36). There is patchy bone marrow edema within the right sacrum adjacent to the right SI joint. Visualized portion of  the left psoas muscle is normal. The posterior paraspinal musculature is normal. The canal from the L4-5 level and caudally is slightly heterogeneous, which is felt to most likely be secondary to motion artifact and prominence of the epidural fat. No definite epidural fluid collection. Disc levels: T12-L1: No significant disc protrusion, foraminal stenosis, or canal stenosis. L1-L2: No significant disc protrusion, foraminal stenosis, or canal stenosis. L2-L3: No significant disc protrusion, foraminal stenosis, or canal stenosis. L3-L4: No significant disc protrusion, foraminal stenosis, or canal stenosis. L4-L5: No significant disc protrusion, foraminal stenosis, or canal stenosis. L5-S1: No significant disc protrusion, foraminal stenosis, or canal stenosis. IMPRESSION: 1. Limited study due to patient motion artifact. Patient was unable to tolerate any postcontrast imaging. 2. 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 Please see dedicated MRI of the pelvis and right hip which more fully images the musculature for further detail. 3. Negative for osteomyelitis-discitis. 4. Slightly heterogeneous appearance of the distal lumbar canal below the L4-5 level on STIR sequences, which is favored to be secondary to a combination of prominent epidural fat and motion artifact. Postcontrast imaging would be helpful to exclude the presence of epidural extension of infection. 5. No significant disc disease. No evidence of nerve impingement. Electronically Signed   By: Davina Poke D.O.   On: 08/02/2019 13:40   MR LUMBAR SPINE W CONTRAST  Result Date: 08/03/2019 CLINICAL DATA:  Extensive right iliopsoas abscess. EXAM: MRI LUMBAR SPINE WITH CONTRAST TECHNIQUE: Postcontrast sagittal and axial images were obtained and compared with the prior MRI of 08/02/2019. CONTRAST:  91mL GADAVIST GADOBUTROL 1 MMOL/ML IV  SOLN COMPARISON:  MRI dated 08/02/2019 FINDINGS: Segmentation:  Standard. Alignment:  Physiologic. Vertebrae:  No pathologic enhancement after contrast administration. Paraspinal and other soft tissues: Extensive right iliopsoas abscess is again noted. Septic right sacroiliac joint is noted with edema and enhancement in the right gluteal muscles and in the right multifidus muscle erector spinae muscle at the L5-S1 level. Enhancement of the right sacral ala consistent with osteomyelitis. Disc levels: There is no pathologic enhancement within the spinal canal. No evidence of epidural extension of the abscess at this time. No pathologic epidural enhancement. IMPRESSION: 1. 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 2. Extensive right iliopsoas abscess. 3. Edema and enhancement of the right gluteal muscles and right multifidus muscle at the L5-S1 level consistent with myositis. 4. No evidence of epidural extension of abscess at this time. Electronically Signed   By: Lorriane Shire M.D.   On: 08/03/2019 15:06   CT ABDOMEN PELVIS W CONTRAST  Addendum Date: 08/03/2019   ADDENDUM REPORT: 08/03/2019 12:49 ADDENDUM: Not mentioned above: Thickening of the intercostal muscle between the right anterior first and second ribs with adjacent pleural reaction and soft tissue edema in the adjacent subcutaneous fat likely reflecting an inflammatory process given the patient's abnormalities along the right iliopsoas muscles and right sacroiliitis. No drainable fluid collection. These findings were discussed with Dr. Patsey Berthold. Electronically Signed   By: Kathreen Devoid   On: 08/03/2019 12:49   Result Date: 08/03/2019 CLINICAL DATA:  Shortness  of breath, severe back pain radiating down the right leg EXAM: CT ANGIOGRAPHY CHEST CT ABDOMEN AND PELVIS WITH CONTRAST TECHNIQUE: Multidetector CT imaging of the chest was performed using the standard protocol during bolus administration of intravenous  contrast. Multiplanar CT image reconstructions and MIPs were obtained to evaluate the vascular anatomy. Multidetector CT imaging of the abdomen and pelvis was performed using the standard protocol during bolus administration of intravenous contrast. CONTRAST:  43mL OMNIPAQUE IOHEXOL 350 MG/ML SOLN COMPARISON:  None. FINDINGS: CTA CHEST FINDINGS Cardiovascular: Satisfactory opacification of the pulmonary arteries to the segmental level. No evidence of pulmonary embolism. Normal heart size. No pericardial effusion. Mediastinum/Nodes: No enlarged mediastinal, hilar, or axillary lymph nodes. Thyroid gland, trachea, and esophagus demonstrate no significant findings. Lungs/Pleura: Lungs are clear. No pleural effusion or pneumothorax. Musculoskeletal: No chest wall abnormality. No acute or significant osseous findings. Review of the MIP images confirms the above findings. CT ABDOMEN and PELVIS FINDINGS Hepatobiliary: No focal liver abnormality is seen. No gallstones, gallbladder wall thickening, or biliary dilatation. Pancreas: Unremarkable. No pancreatic ductal dilatation or surrounding inflammatory changes. Spleen: Normal in size without focal abnormality. Adrenals/Urinary Tract: Adrenal glands are unremarkable. Kidneys are normal, without renal calculi, focal lesion, or hydronephrosis. Bladder is unremarkable. Stomach/Bowel: Stomach is within normal limits. Appendix appears normal. No evidence of bowel wall thickening, distention, or inflammatory changes. Vascular/Lymphatic: No significant vascular findings are present. No enlarged abdominal or pelvic lymph nodes. Reproductive: No adnexal mass. T-type IUD within the uterus. A tip of 'T' has perforated through the uterine wall and projects into the peroneal cavity. Other: No abdominal wall hernia or abnormality. No abdominopelvic ascites. Musculoskeletal: No acute osseous abnormality. No aggressive osseous lesion. Vertebral body heights are maintained and are in normal  anatomic alignment. No bone destruction or periosteal reaction. Mild osteoarthritis of bilateral SI joints. Heterogeneous enhancement and severe expansion of the right psoas and iliacus muscles with mild adjacent soft tissue edema. Review of the MIP images confirms the above findings. IMPRESSION: 1. Heterogeneous enhancement and severe expansion of the right psoas and iliacus muscles with mild adjacent soft tissue edema. Overall appearance is concerning for infectious myositis versus less likely muscle strain and intramuscular hemorrhage. Subtle small hypodensities within the iliopsoas muscle measuring up to 13 mm concerning for an intramuscular abscess. The lumbar spine demonstrates no focal abnormality to suggest discitis or osteomyelitis, but MRI of the lumbar spine without and with intravenous contrast is recommended for increased sensitivity if there is concern for discitis/osteomyelitis. 2. T-type IUD within the uterus. A tip of 'T' has perforated through the uterine wall and projects into the peroneal cavity. 3. No acute cardiopulmonary disease. Electronically Signed: By: Kathreen Devoid On: 08/02/2019 12:09   DG Hand 2 View Right  Result Date: 08/03/2019 CLINICAL DATA:  Abscess. Peritonitis. EXAM: RIGHT HAND - 2 VIEW COMPARISON:  Right hand radiograph 01/30/2019 FINDINGS: There is no evidence of fracture or dislocation. There is no evidence of arthropathy or other focal bone abnormality. Generalized soft tissue edema. No soft tissue air or radiopaque foreign body. IMPRESSION: Generalized soft tissue edema. No soft tissue air or radiopaque foreign body. No osseous abnormality. Electronically Signed   By: Keith Rake M.D.   On: 08/03/2019 19:38   MR HIP RIGHT WO CONTRAST  Result Date: 08/02/2019 CLINICAL DATA:  Severe back pain radiating down the right hip and leg. Fever. EXAM: MR OF THE RIGHT HIP WITHOUT CONTRAST TECHNIQUE: Multiplanar, multisequence MR imaging was performed. No intravenous contrast  was administered. COMPARISON:  CT scan of the abdomen and pelvis dated 08/02/2019 FINDINGS: Bones: There is abnormal edema in the right side of the sacrum and in the right iliac bone with fluid in the right SI joint, likely representing osteomyelitis and sepsis of the right sacroiliac joint. Articular cartilage and labrum Articular cartilage:  Normal. Labrum:  Normal. Joint or bursal effusion Joint effusion:  Trace joint effusion. Bursae: No bursitis. Muscles and tendons Muscles and tendons: Extensive right iliopsoas abscess extending from at least the L3 level to the insertion of the iliopsoas tendon on the lesser trochanter. Is extensive edema in the iliopsoas muscle consistent with myositis. There is also edema in 1 of the right hip abductor muscles with fluid along the fascia of the muscles in the anterior aspect of the proximal right thigh likely representing infectious fasciitis. There is also extension of abscess into the right gluteal muscles through the a posterior aspect of the right sacroiliac joint seen on images 1 and 2 of series 19. There is edema in the gluteus minimus, medius, and maximus muscles consistent with myositis. There is edema in the right piriformis muscle consistent with myositis seen on image 8 of series 19. Other findings Miscellaneous:   Distended urinary bladder. IMPRESSION: 1. Extensive right iliopsoas abscess extending from at least the L3-4 level to the insertion of the iliopsoas tendon on the lesser trochanter. 2. 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 3. Myositis of the right gluteus minimus, medius, and maximus and right piriformis muscles. 4. 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 5. Abscess extends through the posterior aspect of the right SI joint into the gluteal musculature. 6. Fluid along the fascial planes in the anterior aspect of the proximal right thigh likely representing infectious fasciitis. 7. MRI with contrast may better define  the extent of the infection and provide a baseline for a serial evaluation. Electronically Signed   By: Lorriane Shire M.D.   On: 08/02/2019 13:45   DG Chest Port 1 View  Result Date: 08/03/2019 CLINICAL DATA:  Status post central line placement. EXAM: PORTABLE CHEST 1 VIEW COMPARISON:  Single-view of the chest 06/10/2013. FINDINGS: Left IJ approach central venous catheter is in place with the tip projecting in the right atrium. Recommend 3 cm withdrawal. Elevation of the right hemidiaphragm is noted. There is mild basilar atelectasis. No pneumothorax or pleural effusion. Heart size is normal. IMPRESSION: Left IJ catheter tip projects in the right atrium. Recommend withdrawal of 3 cm. Negative for pneumothorax. No acute disease. Electronically Signed   By: Inge Rise M.D.   On: 08/03/2019 12:20   ECHOCARDIOGRAM LIMITED  Result Date: 08/03/2019    ECHOCARDIOGRAM LIMITED REPORT   Patient Name:   Leslie Molina Date of Exam: 08/03/2019 Medical Rec #:  [id number]  Height:       61.0 in Accession #:    [id number] Weight:       145.0 lb Date of Birth:  12/30/1985  BSA:          1.647 m Patient Age:    33 years   BP:           87/42 mmHg Patient Gender: F          HR:           121 bpm. Exam Location:  [facility] Procedure: 2D Echo, Cardiac Doppler and Color Doppler Indications:     Endocarditis 138  History:         Patient has no  prior history of Echocardiogram examinations.                  Anxiety, bipolar 1 disorder, migraine.  Sonographer:     Sherrie Sport RDCS (AE) Referring Phys:  [place] Diagnosing Phys: Bartholome Bill MD IMPRESSIONS  1. No evidence of vegetations. Somewhat tachycardic but able to see valves fairly well.  2. Left ventricular ejection fraction, by estimation, is 65 to 70%. The left ventricle has normal function. Left ventricular diastolic parameters were normal.  3. Right ventricular systolic function is normal. The right ventricular size is mildly enlarged. There is moderately  elevated pulmonary artery systolic pressure.  4. Left atrial size was mildly dilated.  5. The mitral valve is grossly normal. Mild mitral valve regurgitation.  6. The aortic valve is grossly normal. Aortic valve regurgitation is trivial. Conclusion(s)/Recommendation(s): No evidence of valvular vegetations on this transthoracic echocardiogram. Would recommend a transesophageal echocardiogram to exclude infective endocarditis if clinically indicated. FINDINGS  Left Ventricle: Left ventricular ejection fraction, by estimation, is 65 to 70%. The left ventricle has normal function. The left ventricular internal cavity size was normal in size. There is no left ventricular hypertrophy. Right Ventricle: The right ventricular size is mildly enlarged. No increase in right ventricular wall thickness. Right ventricular systolic function is normal. There is moderately elevated pulmonary artery systolic pressure. The tricuspid regurgitant velocity is 3.11 m/s, and with an assumed right atrial pressure of 10 mmHg, the estimated right ventricular systolic pressure is [id number] mmHg. Left Atrium: Left atrial size was mildly dilated. Pericardium: There is no evidence of pericardial effusion. Mitral Valve: The mitral valve is grossly normal. Mild mitral valve regurgitation. There is no evidence of mitral valve vegetation. Tricuspid Valve: The tricuspid valve is grossly normal. Tricuspid valve regurgitation is mild. Aortic Valve: The aortic valve is grossly normal. Aortic valve regurgitation is trivial. There is no evidence of aortic valve vegetation. Pulmonic Valve: The pulmonic valve was grossly normal. Pulmonic valve regurgitation is trivial. There is no evidence of pulmonic valve vegetation. Aorta: The aortic root is normal in size and structure.  LEFT VENTRICLE PLAX 2D LVIDd:         4.82 cm LVIDs:         2.60 cm LV PW:         1.11 cm LV IVS:        0.95 cm LVOT diam:     2.20 cm LVOT Area:     3.80 cm  LEFT ATRIUM         Index LA  diam:    4.10 cm 2.49 cm/m                        PULMONIC VALVE AORTA                 PV Vmax:        1.00 m/s Ao Root diam: 2.60 cm PV Peak grad:   4.0 mmHg                       RVOT Peak grad: 8 mmHg  TRICUSPID VALVE TR Peak grad:   38.7 mmHg TR Vmax:        311.00 cm/s  SHUNTS Systemic Diam: 2.20 cm Bartholome Bill MD Electronically signed by Bartholome Bill MD Signature Date/Time: 08/03/2019/11:20:44 AM    Final     Anti-infectives: Anti-infectives (From admission, onward)   Start  Dose/Rate Route Frequency Ordered Stop   08/03/19 2200  ceFEPIme (MAXIPIME) 2 g in sodium chloride 0.9 % 100 mL IVPB     2 g 200 mL/hr over 30 Minutes Intravenous Every 8 hours 08/03/19 1851     08/03/19 2100  vancomycin (VANCOREADY) IVPB 750 mg/150 mL     750 mg 150 mL/hr over 60 Minutes Intravenous Every 8 hours 08/03/19 1851     08/03/19 2000  clindamycin (CLEOCIN) IVPB 900 mg     900 mg 100 mL/hr over 30 Minutes Intravenous Every 8 hours 08/03/19 1851         Assessment/Plan Septic shock with MRSA bacteremia - unclear source 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 No signs free air or bowel ischemia on CT. Abdomen remains distended but no peritonitis on exam, she is passing flatus and had a BM yesterday. Could have a mild ileus causing distension. IUD per gyn. CCM, ID, ortho, ob/gyn following.  General surgery will sign off, please call with concerns.   LOS: 1 day    Wellington [place], [facility] Surgery 08/04/2019, 10:22 AM Please see Amion for  pager number during day hours 7:00am-4:30pm

## 2019-08-04 NOTE — Progress Notes (Signed)
[facility] for Infectious Disease  Date of Admission:  08/03/2019      Total days of antibiotics 3  Clindamycin  Vancomycin  Cefepime           ASSESSMENT: Leslie Molina is a 34 y.o. female transferred from [facility] for treatment of MRSA bacteremia. She has a history of skin boils and active psoriasis lesions ongoing now. She has infection of the R SI joint and psoas muscle that was aspirated recently. Ortho team following - recommended medical therapy and follow for improvement before considering surgery. She has swelling of the right hand and R supraclavicular area with intact pulse. Would consider CT of the hand to assess for abscess and CTA of head/neck to evaluate supraclavicular swelling. ?septic thrombophlebitis.   Repeat blood cultures positive again and she still continues to fever. Will continue vancomycin alone D/C clindamycin and Cefepime.   Incidental finding of IUD growing through uterine wall - unlikely contributing to current illness as this would be extremely unusual pathogen.    PLAN: 1. Repeat blood cultures Sunday   2. Continue Vancomycin  3. Would check CTA neck/head 4. Would check CT R hand/forearm     Principal Problem:   MRSA bacteremia Active Problems:   Sepsis ([place])   Iliopsoas abscess on right ([place])   Septic shock ([facility])   Pain of right clavicle   Right hand pain   . Chlorhexidine Gluconate Cloth  6 each Topical Daily  . docusate sodium  100 mg Oral BID  . enoxaparin (LOVENOX) injection  40 mg Subcutaneous Q24H  . potassium chloride  40 mEq Oral BID  . sodium chloride flush  10-40 mL Intracatheter Q12H  . traZODone  150 mg Oral QHS    SUBJECTIVE: Noticed R hip pain 1 week ago when she was getting out of her car. Later noted fevers/chills and night sweats. She has also now developed right dorsal hand pain/swelling and redness in addition to R clavicle / axillary swelling and pain that limits to moderate degree raising shoulder.   She  described a boil on the left forearm a few months ago that opened and drained spontaneously as well as 2 on the left upper thigh.   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 UDS negative aside from [facility].   History of psoriasis and currently not on maintenance. Active rashes ongoing now.   Review of Systems: Review of Systems  All other systems reviewed and are negative.   No Known Allergies  OBJECTIVE: Vitals:   08/04/19 1145 08/04/19 1200 08/04/19 1230 08/04/19 1300  BP:  (!) 77/66 96/61 (!) 100/59  Pulse:  (!) 117 (!) 104 (!) 108  Resp: (!) 24 11 (!) 21 16  Temp: 99.4 F (37.4 C)     TempSrc: Oral     SpO2:  94% 93% 96%   There is no height or weight on file to calculate BMI.  Physical Exam Constitutional:      Comments: Uncomfortable   Eyes:     General: No scleral icterus.    Pupils: Pupils are equal, round, and reactive to light.  Cardiovascular:     Rate and Rhythm: Normal rate and regular rhythm.     Pulses: Normal pulses.     Heart sounds: No murmur.  Pulmonary:     Effort: Pulmonary effort is normal.     Breath sounds: Normal breath sounds.  Abdominal:     General: Bowel  sounds are normal. There is no distension.     Tenderness: There is no abdominal tenderness.  Musculoskeletal:     Comments: R shoulder ROM mildly limited with abduction. Tenderness overlying supraclavicular site with swelling / warmth. R axillary node tender.  R hip tenderness. Intact distal pulses.   Skin:    General: Skin is warm and dry.     Capillary Refill: Capillary refill takes less than 2 seconds.     Comments: Multiple scaly plaques c/w psoriasis on extensor surfaces  L forearm well healed previous abscess site.  L Leg well healed previous abscess site.   Neurological:     Mental Status: She is alert and oriented to person, place, and time.  Psychiatric:        Mood and Affect: Mood normal.        Behavior: Behavior normal.     Lab Results Lab Results    Component Value Date   WBC 13.0 (H) 08/04/2019   HGB 9.7 (L) 08/04/2019   HCT 29.3 (L) 08/04/2019   MCV 84.7 08/04/2019   PLT 303 08/04/2019    Lab Results  Component Value Date   CREATININE 0.54 08/04/2019   BUN 5 (L) 08/04/2019   NA 136 08/04/2019   K 2.8 (L) 08/04/2019   CL 102 08/04/2019   CO2 23 08/04/2019    Lab Results  Component Value Date   ALT 18 08/03/2019   AST 24 08/03/2019   ALKPHOS 83 08/03/2019   BILITOT 0.5 08/03/2019     Microbiology: Recent Results (from the past 240 hour(s))  SARS CORONAVIRUS 2 (TAT 6-24 HRS) Nasopharyngeal Nasopharyngeal Swab     Status: None   Collection Time: 07/29/19 10:44 AM   Specimen: Nasopharyngeal Swab  Result Value Ref Range Status   SARS Coronavirus 2 NEGATIVE NEGATIVE Final    Comment: (NOTE) SARS-CoV-2 target nucleic acids are NOT DETECTED. The SARS-CoV-2 RNA is generally detectable in upper and lower respiratory specimens during the acute phase of infection. Negative results do not preclude SARS-CoV-2 infection, do not rule out co-infections with other pathogens, and should not be used as the sole basis for treatment or other patient management decisions. Negative results must be combined with clinical observations, patient history, and epidemiological information. The expected result is Negative. Fact Sheet for Patients: SugarRoll.be Fact Sheet for Healthcare Providers: https://www.Schonberg-mathews.com/ This test is not yet approved or cleared by the [place] FDA and  has been authorized for detection and/or diagnosis of SARS-CoV-2 by FDA under an Emergency Use Authorization (EUA). This EUA will remain  in effect (meaning this test can be used) for the duration of the COVID-19 declaration under Section 56 4(b)(1) of the Act, 21 U.S.C. section 360bbb-3(b)(1), unless the authorization is terminated or revoked sooner. Performed at [facility], [place] [address]  [place], [place] [address]   Respiratory Panel by RT PCR (Flu A&B, Covid) - Nasopharyngeal Swab     Status: None   Collection Time: 08/02/19 11:20 AM   Specimen: Nasopharyngeal Swab  Result Value Ref Range Status   SARS Coronavirus 2 by RT PCR NEGATIVE NEGATIVE Final    Comment: (NOTE) SARS-CoV-2 target nucleic acids are NOT DETECTED. The SARS-CoV-2 RNA is generally detectable in upper respiratoy specimens during the acute phase of infection. The lowest concentration of SARS-CoV-2 viral copies this assay can detect is 131 copies/mL. A negative result does not preclude SARS-Cov-2 infection and should not be used as the sole basis for treatment or other patient  management decisions. A negative result may occur with  improper specimen collection/handling, submission of specimen other than nasopharyngeal swab, presence of viral mutation(s) within the areas targeted by this assay, and inadequate number of viral copies (<131 copies/mL). A negative result must be combined with clinical observations, patient history, and epidemiological information. The expected result is Negative. Fact Sheet for Patients:  PinkCheek.be Fact Sheet for Healthcare Providers:  GravelBags.it This test is not yet ap proved or cleared by the [place] FDA and  has been authorized for detection and/or diagnosis of SARS-CoV-2 by FDA under an Emergency Use Authorization (EUA). This EUA will remain  in effect (meaning this test can be used) for the duration of the COVID-19 declaration under Section 564(b)(1) of the Act, 21 U.S.C. section 360bbb-3(b)(1), unless the authorization is terminated or revoked sooner.    Influenza A by PCR NEGATIVE NEGATIVE Final   Influenza B by PCR NEGATIVE NEGATIVE Final    Comment: (NOTE) The Xpert Xpress SARS-CoV-2/FLU/RSV assay is intended as an aid in  the diagnosis of influenza from Nasopharyngeal swab specimens and  should  not be used as a sole basis for treatment. Nasal washings and  aspirates are unacceptable for Xpert Xpress SARS-CoV-2/FLU/RSV  testing. Fact Sheet for Patients: PinkCheek.be Fact Sheet for Healthcare Providers: GravelBags.it This test is not yet approved or cleared by the [place] FDA and  has been authorized for detection and/or diagnosis of SARS-CoV-2 by  FDA under an Emergency Use Authorization (EUA). This EUA will remain  in effect (meaning this test can be used) for the duration of the  Covid-19 declaration under Section 564(b)(1) of the Act, 21  U.S.C. section 360bbb-3(b)(1), unless the authorization is  terminated or revoked. Performed at [facility], [address] [place], [place] [address]   Blood culture (routine x 2)     Status: Abnormal (Preliminary result)   Collection Time: 08/02/19 11:20 AM   Specimen: BLOOD  Result Value Ref Range Status   Specimen Description   Final    BLOOD RIGHT ANTECUBITAL Performed at [facility], [address], [place], [place] [address]    Special Requests   Final    BOTTLES DRAWN AEROBIC AND ANAEROBIC Blood Culture adequate volume Performed at [facility], [address] [place], [place] [address]    Culture  Setup Time   Final    IN BOTH AEROBIC AND ANAEROBIC BOTTLES GRAM POSITIVE COCCI CRITICAL RESULT CALLED TO, READ BACK BY AND VERIFIED WITH: [place] @2022  08/02/19 [place] Performed at [facility], [address], [place], [place] [address]    Culture STAPHYLOCOCCUS AUREUS (A)  Final   Report Status PENDING  Incomplete  Blood culture (routine x 2)     Status: Abnormal (Preliminary result)   Collection Time: 08/02/19 11:20 AM   Specimen: BLOOD  Result Value Ref Range Status   Specimen Description   Final    BLOOD BLOOD RIGHT ARM Performed at [facility], [address], [place], [place] [address]    Special  Requests   Final    BOTTLES DRAWN AEROBIC AND ANAEROBIC Blood Culture adequate volume Performed at [facility] [address] [place], [place] [address]    Culture  Setup Time   Final    IN BOTH AEROBIC AND ANAEROBIC BOTTLES GRAM POSITIVE COCCI CRITICAL RESULT CALLED TO, READ BACK BY AND VERIFIED WITH: [place] @0019  08/03/19 AKT Performed at [facility], [place] [address] [place], [place]  [address]    Culture STAPHYLOCOCCUS AUREUS (A)  Final   Report Status PENDING  Incomplete  Blood Culture ID Panel (Reflexed)     Status: Abnormal   Collection Time: 08/02/19 11:20 AM  Result Value Ref Range Status   Enterococcus species NOT DETECTED NOT DETECTED Final   Listeria monocytogenes NOT DETECTED NOT DETECTED Final   Staphylococcus species DETECTED (A) NOT DETECTED Final    Comment: CRITICAL RESULT CALLED TO, READ BACK BY AND VERIFIED WITH: SCOTT HALL @0019  08/03/19 AKT    Staphylococcus aureus (BCID) DETECTED (A) NOT DETECTED Final    Comment: Methicillin (oxacillin)-resistant Staphylococcus aureus (MRSA). MRSA is predictably resistant to beta-lactam antibiotics (except ceftaroline). Preferred therapy is vancomycin unless clinically contraindicated. Patient requires contact precautions if  hospitalized. CRITICAL RESULT CALLED TO, READ BACK BY AND VERIFIED WITH: SCOTT HALL @0019  08/03/19 AKT    Methicillin resistance DETECTED (A) NOT DETECTED Final    Comment: CRITICAL RESULT CALLED TO, READ BACK BY AND VERIFIED WITH: SCOTT HALL @0019  08/03/19 AKT    Streptococcus species NOT DETECTED NOT DETECTED Final   Streptococcus agalactiae NOT DETECTED NOT DETECTED Final   Streptococcus pneumoniae NOT DETECTED NOT DETECTED Final   Streptococcus pyogenes NOT DETECTED NOT DETECTED Final   Acinetobacter baumannii NOT DETECTED NOT DETECTED Final   Enterobacteriaceae species NOT DETECTED NOT DETECTED Final   Enterobacter cloacae complex NOT DETECTED NOT DETECTED Final   Escherichia coli  NOT DETECTED NOT DETECTED Final   Klebsiella oxytoca NOT DETECTED NOT DETECTED Final   Klebsiella pneumoniae NOT DETECTED NOT DETECTED Final   Proteus species NOT DETECTED NOT DETECTED Final   Serratia marcescens NOT DETECTED NOT DETECTED Final   Haemophilus influenzae NOT DETECTED NOT DETECTED Final   Neisseria meningitidis NOT DETECTED NOT DETECTED Final   Pseudomonas aeruginosa NOT DETECTED NOT DETECTED Final   Candida albicans NOT DETECTED NOT DETECTED Final   Candida glabrata NOT DETECTED NOT DETECTED Final   Candida krusei NOT DETECTED NOT DETECTED Final   Candida parapsilosis NOT DETECTED NOT DETECTED Final   Candida tropicalis NOT DETECTED NOT DETECTED Final    Comment: Performed at [facility], [address] [place], [place] [address]  Aerobic/Anaerobic Culture (surgical/deep wound)     Status: None (Preliminary result)   Collection Time: 08/02/19  5:29 PM   Specimen: Abscess  Result Value Ref Range Status   Specimen Description   Final    ABSCESS Performed at [facility], [address] [place], [place] [address]    Special Requests ILIACUS MUSCLE  Final   Gram Stain   Final    MODERATE WBC PRESENT,BOTH PMN AND MONONUCLEAR ABUNDANT GRAM POSITIVE COCCI IN PAIRS IN CLUSTERS Performed at [facility], [place] [address], [place], [place] [address]    Culture   Final    ABUNDANT METHICILLIN RESISTANT STAPHYLOCOCCUS AUREUS NO ANAEROBES ISOLATED; CULTURE IN PROGRESS FOR 5 DAYS    Report Status PENDING  Incomplete   Organism ID, Bacteria METHICILLIN RESISTANT STAPHYLOCOCCUS AUREUS  Final      Susceptibility   Methicillin resistant staphylococcus aureus - MIC*    CIPROFLOXACIN >=8 RESISTANT Resistant     ERYTHROMYCIN >=8 RESISTANT Resistant     GENTAMICIN <=0.5 SENSITIVE Sensitive     OXACILLIN >=4 RESISTANT Resistant     TETRACYCLINE <=1 SENSITIVE Sensitive     VANCOMYCIN 1 SENSITIVE Sensitive     TRIMETH/SULFA <=10 SENSITIVE Sensitive      CLINDAMYCIN <=0.25 SENSITIVE Sensitive     RIFAMPIN <=  0.5 SENSITIVE Sensitive     Inducible Clindamycin NEGATIVE Sensitive     * ABUNDANT METHICILLIN RESISTANT STAPHYLOCOCCUS AUREUS  MRSA PCR Screening     Status: Abnormal   Collection Time: 08/02/19  8:24 PM   Specimen: Nasal Mucosa; Nasopharyngeal  Result Value Ref Range Status   MRSA by PCR POSITIVE (A) NEGATIVE Final    Comment:        The GeneXpert MRSA Assay (FDA approved for NASAL specimens only), is one component of a comprehensive MRSA colonization surveillance program. It is not intended to diagnose MRSA infection nor to guide or monitor treatment for MRSA infections. RESULT CALLED TO, READ BACK BY AND VERIFIED WITH: Gypsy Lore 08/02/19 @ 2143  [place] Performed at [facility], [address] [place], [place] [address]   CULTURE, BLOOD (ROUTINE X 2) w Reflex to ID Panel     Status: None (Preliminary result)   Collection Time: 08/03/19 10:17 AM   Specimen: BLOOD  Result Value Ref Range Status   Specimen Description BLOOD LEFT ANTECUBITAL  Final   Special Requests   Final    BOTTLES DRAWN AEROBIC AND ANAEROBIC Blood Culture adequate volume   Culture  Setup Time   Final    GRAM POSITIVE COCCI IN BOTH AEROBIC AND ANAEROBIC BOTTLES CRITICAL VALUE NOTED.  VALUE IS CONSISTENT WITH PREVIOUSLY REPORTED AND CALLED VALUE. Performed at [facility], [address] [place], [place] [address]    Culture [facility] POSITIVE COCCI  Final   Report Status PENDING  Incomplete  CULTURE, BLOOD (ROUTINE X 2) w Reflex to ID Panel     Status: None (Preliminary result)   Collection Time: 08/03/19 10:32 AM   Specimen: BLOOD  Result Value Ref Range Status   Specimen Description BLOOD BLOOD LEFT HAND  Final   Special Requests   Final    BOTTLES DRAWN AEROBIC AND ANAEROBIC Blood Culture adequate volume   Culture  Setup Time   Final    GRAM POSITIVE COCCI IN BOTH AEROBIC AND ANAEROBIC BOTTLES CRITICAL RESULT CALLED TO, READ  BACK BY AND VERIFIED WITH: [place] [facility] [id number] 08/04/19 [place] Performed at [facility], [address], [place], [place] [address]    Culture GRAM POSITIVE COCCI  Final   Report Status PENDING  Incomplete    Janene Madeira, MSN, NP-C [facility] Infectious Disease [email or web address] Pager: [phone] Office: [phone] [place]: [phone]

## 2019-08-04 NOTE — Progress Notes (Signed)
PULMONARY / CRITICAL CARE MEDICINE   NAME:  Leslie Molina, MRN:  [id number], DOB:  1985/07/17, LOS: 1 ADMISSION DATE:  08/03/2019,  REFERRING MD:  Kurtis Bushman, MD, [facility], CHIEF COMPLAINT: MRSA bacteremia  BRIEF HISTORY:    34 year old woman, ex-IVDU presented to [facility] with complaints of lower back pain, abdominal pain and pain in her lower extremity.  Found to have MRSA bacteremia, imaging showing osteomyelitis of sacrum, psoas initial abscess and perforated uterus from IUD.  Hence transferred to [facility] for surgical/GYN input   HISTORY OF PRESENT ILLNESS   Leslie Molina is a 34 yo female with PMX significant forwho presented to the ED with complaints of persisted lower back pain and worsening right lower extremity pain. Per review of medical records patient had been seen in the ED twice the week prior to admission for similar complaints. Pain rated 10/10 at its worst. She denies any chest pain,. cough, shortness of breath, nausea, vomiting, or lower extremity edema .   On admission she was seen febrile with a Tmax of 102 and tachycardic. Labwork significant for sodium 128, potassium 3.0, Chloride 89, Lactic WNL, procalcitonin 5.99, and  WBC 14.0. CT chest/abdomen/plevis on admission concerning for infectious myositis. Patient was admitted to critical care for sepsis workup. She had CT guided need aspiration of abscess evening of admission.   Further work during admission with MRI right hip and lumbar spine revealed extensive myositis of the right iliopsoas muscle, myositis of the gluteus medius, maximus, and minimus as well as osteomyelitis of the right side of the sacrum and iliac bone. Patient was [facility] for higher level of care including surgical/GYN/ and orthopedic consultations   SIGNIFICANT PAST MEDICAL HISTORY   IVDU (has no usd within the last year per patient) Migraines Depression Bipolar disease Anxiety   SIGNIFICANT EVENTS:  Admitted [facility] 3/3 Transferred [facility] 3/4 CT  guided need aspiration of abscess 3/3  STUDIES:   Lumbar spine xray 3/3 > Mild facet sclerosis at L5-S1.  CT Chest/Abdomen/Plevis 3/3 >  1. Heterogeneous enhancement and severe expansion of the right psoas and iliacus muscles with mild adjacent soft tissue edema. Overall appearance is concerning for infectious myositis versus less likely muscle strain and intramuscular hemorrhage. Subtle small hypodensities within the iliopsoas muscle measuring up to 13 mm concerning for an intramuscular abscess. The lumbar spine demonstrates no focal abnormality to suggest discitis or osteomyelitis, but MRI of the lumbar spine without and with intravenous contrast is recommended for increased sensitivity if there is concern for discitis/osteomyelitis. 2. T-type IUD within the uterus. A tip of 'T' has perforated through the uterine wall and projects into the peroneal cavity. 3. No acute cardiopulmonary disease.  MRI right hip 3/3 WO contrast  >  1. Extensive right iliopsoas abscess extending from at least the L3-4 level to the insertion of the iliopsoas tendon on the lesser trochanter. 2. 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 3. Myositis of the right gluteus minimus, medius, and maximus and right piriformis muscles. 4. 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 5. Abscess extends through the posterior aspect of the right SI joint into the gluteal musculature. 6. Fluid along the fascial planes in the anterior aspect of the proximal right thigh likely representing infectious fasciitis. 7. MRI with contrast may better define the extent of the infection and provide a baseline for a serial evaluation.  MRI lumbar spine WO contrast 3/3 > 1. Limited study due to patient motion artifact. Patient was unable  to tolerate any postcontrast imaging. 2. Findings suggestive of infectious right-sided sacroiliitis with associated myositis and intramuscular abscesses involving  the right psoas and iliacus musculature. Please see dedicated MRI of the pelvis and right hip which more fully images the musculature for further detail. 3. Negative for osteomyelitis-discitis. 4. Slightly heterogeneous appearance of the distal lumbar canal below the L4-5 level on STIR sequences, which is favored to be secondary to a combination of prominent epidural fat and motion artifact. Postcontrast imaging would be helpful to exclude the presence of epidural extension of infection. 5. No significant disc disease. No evidence of nerve impingement.  MR lumbar spine W contrast 3/4 > 1. 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 2. Extensive right iliopsoas abscess. 3. Edema and enhancement of the right gluteal muscles and right multifidus muscle at the L5-S1 level consistent with myositis. 4. No evidence of epidural extension of abscess at this time. CULTURES:  COVID 3/3 > negative  MRSA PCR 3/3 > Positive  Blood culture 3/3 >  MRSA  Pelvic abcess culture 3/3 >MRSA 3/4 blood: gpc  ANTIBIOTICS:  Daptomycin 3/3 Cefepime 3/3-> Clindamycin 3/3-> vanc 3/4->  LINES/TUBES:   [facility] 3/4 >>  CONSULTANTS:  Gyn Ortho ID gen surgery  SUBJECTIVE:  3/5: cursing profusely 2/2 pain, Calling out in pain. Was ordered IV tylenol overnight, unclear why this option was chosen as opposed to PO tylenol vs ibuprofen without bleeding or renal injury or gi upset. tmax to 103 3/4:Complains of pain at different sites-right neck, chest wall, buttocks, abdomen  CONSTITUTIONAL: BP 104/61   Pulse (!) 121   Temp (!) 102.6 F (39.2 C) (Oral) Comment: getting PRN order  Resp (!) 27   LMP  (LMP Unknown)   SpO2 91%   I/O last 3 completed shifts: In: 1333.4 [I.V.:549; IV Piggyback:784.4] Out: 1900 [Urine:1900]        PHYSICAL EXAM: General: Young woman, calm resting comfortably in bed, nad Neuro: Alert, interactive, nonfocal, no meningeal  signs HEENT: No JVD, no icterus or pallor, pupils 3 mm RTL, focal tenderness over right sternomastoid Cardiovascular: S1-S2 tachy, no murmur Lungs: Clear breath sounds bilateral Abdomen: Soft, mildly distended, mildly tender to palp, no guarding Musculoskeletal: Tender swelling over dorsum of right hand Skin: Psoriatic patches over both legs, multiple tattoos and on face  Lab Results  Component Value Date   WBC 13.0 (H) 08/04/2019   HGB 9.7 (L) 08/04/2019   HCT 29.3 (L) 08/04/2019   MCV 84.7 08/04/2019   PLT 303 08/04/2019   Lab Results  Component Value Date   CREATININE 0.54 08/04/2019   BUN 5 (L) 08/04/2019   NA 136 08/04/2019   K 2.8 (L) 08/04/2019   CL 102 08/04/2019   CO2 23 08/04/2019     RESOLVED PROBLEM LIST   ASSESSMENT AND PLAN   Source of MRSA is not clear, infected IUD is the main concern, she denies ongoing IVDU, has been clean "for a year"   Sepsis shock with MRSA bacteremia  -Criteria: Febrile with Tmax 102.6, tachycardia, tachypnea, leukocytosis, and confirmed infection  -Has seeded multiple areas including osteomyelitis and iliopsoas and gluteal abscess  P: -transfer to intermediate Supplemental oxygen Continue Vancomycin and daptomycin -blood cx from 3/4 remain positive IV hydration Will need TEE ID consult pending Pct 5.99   Iliopsoas and gluteal abscesses Osteomyelitis of sacrum and ischium Possible infection over R hand dorsum and R Sternoclav joint -Seen on MRI right hip and lumbar spine  P:  ID to see Orthopedic consult appreciated Ck 410->  Peritonitis  IUD perf noted on CT P: Consulted GYN / surgery Npo for now Morphine as needed -no acute surgical indication   Hypokalemia  P: Supplement po and IV as remaining low Trend Bmet -mag ok  Bipolar disease  Anxiety Depression P: Hold home Cymbalta, Seroquel, Trazadone    Best Practice / Goals of Care / Disposition.   DVT PROPHYLAXIS: Lovenox SUP: Protonix NUTRITION:  clear to full liquids as tolerated without plans for surgical intervention MOBILITY: Bedrest GOALS OF CARE: N/A FAMILY DISCUSSIONS: N/A DISPOSITION intermediate care  LABS  Glucose Recent Labs  Lab 08/02/19 2113  GLUCAP 127*    BMET Recent Labs  Lab 08/03/19 0250 08/03/19 1847 08/04/19 0500  NA 134* 134* 136  K 2.9* 2.9* 2.8*  CL 104 102 102  CO2 25 22 23   BUN 15 6 5*  CREATININE 0.50 0.55 0.54  GLUCOSE 130* 121* 111*    Liver Enzymes Recent Labs  Lab 08/02/19 0843 08/03/19 0250  AST 30 24  ALT 24 18  ALKPHOS 103 83  BILITOT 0.5 0.5  ALBUMIN 2.9* 2.3*    Electrolytes Recent Labs  Lab 08/03/19 0250 08/03/19 1847 08/04/19 0500  CALCIUM 7.6* 7.6* 7.7*  MG 2.3 1.9  --   PHOS  --  2.2*  --     CBC Recent Labs  Lab 08/02/19 0843 08/03/19 1847 08/04/19 0500  WBC 14.0* 12.1* 13.0*  HGB 11.8* 9.7* 9.7*  HCT 33.7* 28.3* 29.3*  PLT 304 280 303    ABG No results for input(s): PHART, PCO2ART, PO2ART in the last 168 hours.  Coag's Recent Labs  Lab 08/03/19 0250  INR 1.3*    Sepsis Markers Recent Labs  Lab 08/02/19 1130 08/03/19 0250 08/03/19 1847  LATICACIDVEN 1.4  --  0.8  PROCALCITON  --  5.99  --     Cardiac Enzymes No results for input(s): TROPONINI, PROBNP in the last 168 hours.      [id number].    [facility] 08/04/2019, 9:47 AM

## 2019-08-04 NOTE — Progress Notes (Signed)
Received pt and temp 101.2.  Pt received acetaminophen at 1745.  Critical care made aware.  Idolina Primer, RN

## 2019-08-04 NOTE — Consult Note (Signed)
Reason for Consult:Right iliopsoas abscess Referring Physician: Zacara Eun is an 34 y.o. female.  HPI: Florastine comes in with a week of right hip and back pain. 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 She notes that the pain has been steadily getting worse over the last week. She endorses fevers, chills, and sweats but no N/V. The pain is worst with ambulation though she was still able to bear weight upon admission. She denies prior hx/o similar.   Past Medical History:  Diagnosis Date  . Anxiety   . Bipolar 1 disorder ([place])   . Depression   . Migraine   . Nerve damage     Past Surgical History:  Procedure Laterality Date  . CESAREAN SECTION      Family History  Problem Relation Age of Onset  . Depression Mother   . Bipolar disorder Mother   . Depression Father   . Bipolar disorder Father   . Depression Sister   . Bipolar disorder Sister     Social History:  reports that she has been smoking cigarettes. She has been smoking about 1.00 pack per day. She has never used smokeless tobacco. She reports that she does not drink alcohol or use drugs.  Allergies: No Known Allergies  Medications: I have reviewed the patient's current medications.  Results for orders placed or performed during the hospital encounter of 08/03/19 (from the past 48 hour(s))  Basic metabolic panel     Status: Abnormal   Collection Time: 08/03/19  6:47 PM  Result Value Ref Range   Sodium 134 (L) 135 - 145 mmol/L   Potassium 2.9 (L) 3.5 - 5.1 mmol/L   Chloride 102 98 - 111 mmol/L   CO2 22 22 - 32 mmol/L   Glucose, Bld 121 (H) 70 - 99 mg/dL    Comment: Glucose reference range applies only to samples taken after fasting for at least 8 hours.   BUN 6 6 - 20 mg/dL   Creatinine, Ser 0.55 0.44 - 1.00 mg/dL   Calcium 7.6 (L) 8.9 - 10.3 mg/dL   GFR calc non Af Amer >60 >60  mL/min   GFR calc Af Amer >60 >60 mL/min   Anion gap 10 5 - 15    Comment: Performed at [facility] [address] [place], [place] [address]  Lactic acid, plasma     Status: None   Collection Time: 08/03/19  6:47 PM  Result Value Ref Range   Lactic Acid, Venous 0.8 0.5 - 1.9 mmol/L    Comment: Performed at [facility] [address] [place], [place] [address]  Magnesium     Status: None   Collection Time: 08/03/19  6:47 PM  Result Value Ref Range   Magnesium 1.9 1.7 - 2.4 mg/dL    Comment: Performed at [facility], [place] [address], [place], [place] [address]  Phosphorus     Status: Abnormal   Collection Time: 08/03/19  6:47 PM  Result Value Ref Range   Phosphorus 2.2 (L) 2.5 - 4.6 mg/dL    Comment: Performed at [facility] [address], [place], [place] [address]  CBC WITH DIFFERENTIAL     Status: Abnormal   Collection Time: 08/03/19  6:47 PM  Result Value Ref Range   WBC 12.1 (H) 4.0 - 10.5 K/uL   RBC 3.38 (L) 3.87 -  5.11 MIL/uL   Hemoglobin 9.7 (L) 12.0 - 15.0 g/dL   HCT 28.3 (L) 36.0 - 46.0 %   MCV 83.7 80.0 - 100.0 fL   MCH 28.7 26.0 - 34.0 pg   MCHC 34.3 30.0 - 36.0 g/dL   RDW 14.4 11.5 - 15.5 %   Platelets 280 150 - 400 K/uL   nRBC 0.0 0.0 - 0.2 %   Neutrophils Relative % 87 %   Neutro Abs 10.5 (H) 1.7 - 7.7 K/uL   Lymphocytes Relative 9 %   Lymphs Abs 1.1 0.7 - 4.0 K/uL   Monocytes Relative 4 %   Monocytes Absolute 0.5 0.1 - 1.0 K/uL   Eosinophils Relative 0 %   Eosinophils Absolute 0.0 0.0 - 0.5 K/uL   Basophils Relative 0 %   Basophils Absolute 0.0 0.0 - 0.1 K/uL   nRBC 0 0 /100 WBC   Abs Immature Granulocytes 0.00 0.00 - 0.07 K/uL    Comment: Performed at [facility] [address] [place], [place] [address]  CBC     Status: Abnormal   Collection Time: 08/04/19  5:00 AM  Result Value Ref Range   WBC 13.0 (H) 4.0 - 10.5 K/uL   RBC 3.46 (L) 3.87 - 5.11 MIL/uL   Hemoglobin 9.7 (L) 12.0 - 15.0 g/dL   HCT 29.3 (L)  36.0 - 46.0 %   MCV 84.7 80.0 - 100.0 fL   MCH 28.0 26.0 - 34.0 pg   MCHC 33.1 30.0 - 36.0 g/dL   RDW 14.6 11.5 - 15.5 %   Platelets 303 150 - 400 K/uL   nRBC 0.0 0.0 - 0.2 %    Comment: Performed at [facility], [place] [address], [place], [place] [id number]  Basic metabolic panel     Status: Abnormal   Collection Time: 08/04/19  5:00 AM  Result Value Ref Range   Sodium 136 135 - 145 mmol/L   Potassium 2.8 (L) 3.5 - 5.1 mmol/L   Chloride 102 98 - 111 mmol/L   CO2 23 22 - 32 mmol/L   Glucose, Bld 111 (H) 70 - 99 mg/dL    Comment: Glucose reference range applies only to samples taken after fasting for at least 8 hours.   BUN 5 (L) 6 - 20 mg/dL   Creatinine, Ser 0.54 0.44 - 1.00 mg/dL   Calcium 7.7 (L) 8.9 - 10.3 mg/dL   GFR calc non Af Amer >60 >60 mL/min   GFR calc Af Amer >60 >60 mL/min   Anion gap 11 5 - 15    Comment: Performed at [facility] [address], [place], [place] [address]    DG Lumbar Spine 2-3 Views  Result Date: 08/02/2019 CLINICAL DATA:  Radicular back pain to the right lower extremity, no injury. EXAM: LUMBAR SPINE - 2-3 VIEW COMPARISON:  06/10/2013. FINDINGS: Very mild levoconvex curvature of the lumbar spine may be positional. Alignment is otherwise anatomic. Vertebral body and disc space height are normal. Mild facet sclerosis in the lower lumbar spine. Intrauterine contraceptive device is incidentally noted. IMPRESSION: Mild facet sclerosis at L5-S1. Electronically Signed   By: Lorin Picket M.D.   On: 08/02/2019 09:59   DG Shoulder 1V Right  Result Date: 08/03/2019 CLINICAL DATA:  Abscess. EXAM: RIGHT SHOULDER - 1 VIEW COMPARISON:  Chest CT yesterday. FINDINGS: There is no evidence of fracture or dislocation on single view. Overlying monitoring device projects over the acromioclavicular joint limiting assessment. No bony destruction  or periosteal reaction. Mild soft tissue edema. No soft tissue air or radiopaque foreign body. IMPRESSION: Mild soft  tissue edema. No acute osseous abnormality. Electronically Signed   By: Keith Rake M.D.   On: 08/03/2019 19:40   DG Abd 1 View  Result Date: 08/03/2019 CLINICAL DATA:  Peritonitis. EXAM: ABDOMEN - 1 VIEW COMPARISON:  Abdominal CT yesterday. FINDINGS: Left aspect of the abdomen not included in the field of view. Air-filled small and large bowel in a nonobstructive pattern. No evidence of free air. Intrauterine device in the left pelvis. IMPRESSION: Nonobstructive bowel gas pattern, no evidence of free air. Left aspect of the abdomen not included in the field of view. Electronically Signed   By: Keith Rake M.D.   On: 08/03/2019 19:41   CT Angio Chest PE W and/or Wo Contrast  Addendum Date: 08/03/2019   ADDENDUM REPORT: 08/03/2019 12:49 ADDENDUM: Not mentioned above: Thickening of the intercostal muscle between the right anterior first and second ribs with adjacent pleural reaction and soft tissue edema in the adjacent subcutaneous fat likely reflecting an inflammatory process given the patient's abnormalities along the right iliopsoas muscles and right sacroiliitis. No drainable fluid collection. These findings were discussed with Dr. Patsey Berthold. Electronically Signed   By: Kathreen Devoid   On: 08/03/2019 12:49   Result Date: 08/03/2019 CLINICAL DATA:  Shortness of breath, severe back pain radiating down the right leg EXAM: CT ANGIOGRAPHY CHEST CT ABDOMEN AND PELVIS WITH CONTRAST TECHNIQUE: Multidetector CT imaging of the chest was performed using the standard protocol during bolus administration of intravenous contrast. Multiplanar CT image reconstructions and MIPs were obtained to evaluate the vascular anatomy. Multidetector CT imaging of the abdomen and pelvis was performed using the standard protocol during bolus administration of intravenous contrast. CONTRAST:  87mL OMNIPAQUE IOHEXOL 350 MG/ML SOLN COMPARISON:  None. FINDINGS: CTA CHEST FINDINGS Cardiovascular: Satisfactory opacification of the  pulmonary arteries to the segmental level. No evidence of pulmonary embolism. Normal heart size. No pericardial effusion. Mediastinum/Nodes: No enlarged mediastinal, hilar, or axillary lymph nodes. Thyroid gland, trachea, and esophagus demonstrate no significant findings. Lungs/Pleura: Lungs are clear. No pleural effusion or pneumothorax. Musculoskeletal: No chest wall abnormality. No acute or significant osseous findings. Review of the MIP images confirms the above findings. CT ABDOMEN and PELVIS FINDINGS Hepatobiliary: No focal liver abnormality is seen. No gallstones, gallbladder wall thickening, or biliary dilatation. Pancreas: Unremarkable. No pancreatic ductal dilatation or surrounding inflammatory changes. Spleen: Normal in size without focal abnormality. Adrenals/Urinary Tract: Adrenal glands are unremarkable. Kidneys are normal, without renal calculi, focal lesion, or hydronephrosis. Bladder is unremarkable. Stomach/Bowel: Stomach is within normal limits. Appendix appears normal. No evidence of bowel wall thickening, distention, or inflammatory changes. Vascular/Lymphatic: No significant vascular findings are present. No enlarged abdominal or pelvic lymph nodes. Reproductive: No adnexal mass. T-type IUD within the uterus. A tip of 'T' has perforated through the uterine wall and projects into the peroneal cavity. Other: No abdominal wall hernia or abnormality. No abdominopelvic ascites. Musculoskeletal: No acute osseous abnormality. No aggressive osseous lesion. Vertebral body heights are maintained and are in normal anatomic alignment. No bone destruction or periosteal reaction. Mild osteoarthritis of bilateral SI joints. Heterogeneous enhancement and severe expansion of the right psoas and iliacus muscles with mild adjacent soft tissue edema. Review of the MIP images confirms the above findings. IMPRESSION: 1. Heterogeneous enhancement and severe expansion of the right psoas and iliacus muscles with mild  adjacent soft tissue edema. Overall appearance is concerning for infectious myositis  versus less likely muscle strain and intramuscular hemorrhage. Subtle small hypodensities within the iliopsoas muscle measuring up to 13 mm concerning for an intramuscular abscess. The lumbar spine demonstrates no focal abnormality to suggest discitis or osteomyelitis, but MRI of the lumbar spine without and with intravenous contrast is recommended for increased sensitivity if there is concern for discitis/osteomyelitis. 2. T-type IUD within the uterus. A tip of 'T' has perforated through the uterine wall and projects into the peroneal cavity. 3. No acute cardiopulmonary disease. Electronically Signed: By: Kathreen Devoid On: 08/02/2019 12:09   CT GUIDED NEEDLE PLACEMENT  Result Date: 08/02/2019 INDICATION: History of intravenous drug abuse, now with right pelvic/retroperitoneal abscess. Please perform CT-guided aspiration and/or drainage catheter placement. EXAM: CT-GUIDED RIGHT ILIACUS MUSCULAR ABSCESS ASPIRATION COMPARISON:  CT abdomen pelvis-earlier same day; lumbar spine and right hip MRI-earlier same MEDICATIONS: None ANESTHESIA/SEDATION: Moderate (conscious) sedation was employed during this procedure. A total of Versed 3 mg and Fentanyl 150 mcg was administered intravenously. Moderate Sedation Time: 34 minutes. The patient's level of consciousness and vital signs were monitored continuously by radiology nursing throughout the procedure under my direct supervision. CONTRAST:  None COMPLICATIONS: None immediate. PROCEDURE: Informed written consent was obtained from the patient after a discussion of the risks, benefits and alternatives to treatment. The patient was placed supine on the CT gantry and a pre procedural CT was performed re-demonstrating the known abscess/fluid collection within the right iliacus musculature with dominant ill-defined component measuring approximately 2.4 x 2.0 cm (image 60, series 2). The procedure  was planned. A timeout was performed prior to the initiation of the procedure. The skin overlying the inferior anterior aspect of the right lower abdomen/pelvis was prepped and draped in the usual sterile fashion. The overlying soft tissues were anesthetized with 1% lidocaine with epinephrine. Appropriate trajectory was planned with the use of a 22 gauge spinal needle. An 18 gauge trocar needle was advanced into the abscess/fluid collection. Appropriate positioning was confirmed with a limited CT scan. Given the small size of the collection as well as lack of peripheral wall enhancement on preceding contrast-enhanced abdominal CT, the decision was made to pursue aspiration at this time. Next, approximately 3 cc of purulent appearing fluid was aspirated as the trocar needle was slowly retracted. All aspirated fluid was capped and sent to the laboratory for analysis. A dressing was applied. The patient tolerated the procedure well without immediate post procedural complication. IMPRESSION: Successful CT guided aspiration of approximately 3 cc of purulent appearing fluid from right iliacus intramuscular abscess, currently not amenable to image guided drainage catheter placement. All aspirated fluid was capped and sent to the laboratory as requested by the ordering clinical team. Electronically Signed   By: Sandi Mariscal M.D.   On: 08/02/2019 17:53   MR LUMBAR SPINE WO CONTRAST  Result Date: 08/02/2019 CLINICAL DATA:  Low back pain with radiculopathy. No recent injury EXAM: MRI LUMBAR SPINE WITHOUT CONTRAST TECHNIQUE: Multiplanar, multisequence MR imaging of the lumbar spine was performed. No intravenous contrast was administered. COMPARISON:  CT 08/02/2019 FINDINGS: Technical note: Examination is slightly degraded by patient motion artifact. Patient was unable to tolerate any further imaging and no postcontrast sequences were able to be performed. Segmentation:  Standard. Alignment:  Physiologic. Vertebrae:  No  fracture, evidence of discitis, or bone lesion. Conus medullaris and cauda equina: Conus extends to the L2 level. Conus and cauda equina appear normal. Paraspinal and other soft tissues: Expansion and extensive fluid and edema signal within the right psoas  muscle as well as the visualized portion of the right iliacus muscle. Fluid collection within the medial aspect of the right iliacus muscle measures up to 2.2 x 1.3 cm in transaxial dimension. The craniocaudal extent of the collection is not entirely included within the field of view. Fluid signal is seen within both the medial iliacus and or distal psoas muscle at the edge of the field of view which appears to extend from the right sacroiliac joint (series 7, image 36). There is patchy bone marrow edema within the right sacrum adjacent to the right SI joint. Visualized portion of the left psoas muscle is normal. The posterior paraspinal musculature is normal. The canal from the L4-5 level and caudally is slightly heterogeneous, which is felt to most likely be secondary to motion artifact and prominence of the epidural fat. No definite epidural fluid collection. Disc levels: T12-L1: No significant disc protrusion, foraminal stenosis, or canal stenosis. L1-L2: No significant disc protrusion, foraminal stenosis, or canal stenosis. L2-L3: No significant disc protrusion, foraminal stenosis, or canal stenosis. L3-L4: No significant disc protrusion, foraminal stenosis, or canal stenosis. L4-L5: No significant disc protrusion, foraminal stenosis, or canal stenosis. L5-S1: No significant disc protrusion, foraminal stenosis, or canal stenosis. IMPRESSION: 1. Limited study due to patient motion artifact. Patient was unable to tolerate any postcontrast imaging. 2. 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 Please see dedicated MRI of the pelvis and right  hip which more fully images the musculature for further detail. 3. Negative for osteomyelitis-discitis. 4. Slightly heterogeneous appearance of the distal lumbar canal below the L4-5 level on STIR sequences, which is favored to be secondary to a combination of prominent epidural fat and motion artifact. Postcontrast imaging would be helpful to exclude the presence of epidural extension of infection. 5. No significant disc disease. No evidence of nerve impingement. Electronically Signed   By: Davina Poke D.O.   On: 08/02/2019 13:40   MR LUMBAR SPINE W CONTRAST  Result Date: 08/03/2019 CLINICAL DATA:  Extensive right iliopsoas abscess. EXAM: MRI LUMBAR SPINE WITH CONTRAST TECHNIQUE: Postcontrast sagittal and axial images were obtained and compared with the prior MRI of 08/02/2019. CONTRAST:  63mL GADAVIST GADOBUTROL 1 MMOL/ML IV SOLN COMPARISON:  MRI dated 08/02/2019 FINDINGS: Segmentation:  Standard. Alignment:  Physiologic. Vertebrae:  No pathologic enhancement after contrast administration. Paraspinal and other soft tissues: Extensive right iliopsoas abscess is again noted. Septic right sacroiliac joint is noted with edema and enhancement in the right gluteal muscles and in the right multifidus muscle erector spinae muscle at the L5-S1 level. Enhancement of the right sacral ala consistent with osteomyelitis. Disc levels: There is no pathologic enhancement within the spinal canal. No evidence of epidural extension of the abscess at this time. No pathologic epidural enhancement. IMPRESSION: 1. 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 2. Extensive right iliopsoas abscess. 3. Edema and enhancement of the right gluteal muscles and right multifidus muscle at the L5-S1 level consistent with myositis. 4. No evidence of epidural extension of abscess at this time. Electronically Signed   By: Lorriane Shire M.D.   On: 08/03/2019 15:06   CT ABDOMEN PELVIS W  CONTRAST  Addendum Date: 08/03/2019   ADDENDUM REPORT: 08/03/2019 12:49 ADDENDUM: Not mentioned above: Thickening of the intercostal muscle between the right anterior first and second ribs with adjacent pleural reaction and soft tissue edema in the adjacent subcutaneous fat likely reflecting an inflammatory  process given the patient's abnormalities along the right iliopsoas muscles and right sacroiliitis. No drainable fluid collection. These findings were discussed with Dr. Patsey Berthold. Electronically Signed   By: Kathreen Devoid   On: 08/03/2019 12:49   Result Date: 08/03/2019 CLINICAL DATA:  Shortness of breath, severe back pain radiating down the right leg EXAM: CT ANGIOGRAPHY CHEST CT ABDOMEN AND PELVIS WITH CONTRAST TECHNIQUE: Multidetector CT imaging of the chest was performed using the standard protocol during bolus administration of intravenous contrast. Multiplanar CT image reconstructions and MIPs were obtained to evaluate the vascular anatomy. Multidetector CT imaging of the abdomen and pelvis was performed using the standard protocol during bolus administration of intravenous contrast. CONTRAST:  58mL OMNIPAQUE IOHEXOL 350 MG/ML SOLN COMPARISON:  None. FINDINGS: CTA CHEST FINDINGS Cardiovascular: Satisfactory opacification of the pulmonary arteries to the segmental level. No evidence of pulmonary embolism. Normal heart size. No pericardial effusion. Mediastinum/Nodes: No enlarged mediastinal, hilar, or axillary lymph nodes. Thyroid gland, trachea, and esophagus demonstrate no significant findings. Lungs/Pleura: Lungs are clear. No pleural effusion or pneumothorax. Musculoskeletal: No chest wall abnormality. No acute or significant osseous findings. Review of the MIP images confirms the above findings. CT ABDOMEN and PELVIS FINDINGS Hepatobiliary: No focal liver abnormality is seen. No gallstones, gallbladder wall thickening, or biliary dilatation. Pancreas: Unremarkable. No pancreatic ductal dilatation or  surrounding inflammatory changes. Spleen: Normal in size without focal abnormality. Adrenals/Urinary Tract: Adrenal glands are unremarkable. Kidneys are normal, without renal calculi, focal lesion, or hydronephrosis. Bladder is unremarkable. Stomach/Bowel: Stomach is within normal limits. Appendix appears normal. No evidence of bowel wall thickening, distention, or inflammatory changes. Vascular/Lymphatic: No significant vascular findings are present. No enlarged abdominal or pelvic lymph nodes. Reproductive: No adnexal mass. T-type IUD within the uterus. A tip of 'T' has perforated through the uterine wall and projects into the peroneal cavity. Other: No abdominal wall hernia or abnormality. No abdominopelvic ascites. Musculoskeletal: No acute osseous abnormality. No aggressive osseous lesion. Vertebral body heights are maintained and are in normal anatomic alignment. No bone destruction or periosteal reaction. Mild osteoarthritis of bilateral SI joints. Heterogeneous enhancement and severe expansion of the right psoas and iliacus muscles with mild adjacent soft tissue edema. Review of the MIP images confirms the above findings. IMPRESSION: 1. Heterogeneous enhancement and severe expansion of the right psoas and iliacus muscles with mild adjacent soft tissue edema. Overall appearance is concerning for infectious myositis versus less likely muscle strain and intramuscular hemorrhage. Subtle small hypodensities within the iliopsoas muscle measuring up to 13 mm concerning for an intramuscular abscess. The lumbar spine demonstrates no focal abnormality to suggest discitis or osteomyelitis, but MRI of the lumbar spine without and with intravenous contrast is recommended for increased sensitivity if there is concern for discitis/osteomyelitis. 2. T-type IUD within the uterus. A tip of 'T' has perforated through the uterine wall and projects into the peroneal cavity. 3. No acute cardiopulmonary disease. Electronically  Signed: By: Kathreen Devoid On: 08/02/2019 12:09   DG Hand 2 View Right  Result Date: 08/03/2019 CLINICAL DATA:  Abscess. Peritonitis. EXAM: RIGHT HAND - 2 VIEW COMPARISON:  Right hand radiograph 01/30/2019 FINDINGS: There is no evidence of fracture or dislocation. There is no evidence of arthropathy or other focal bone abnormality. Generalized soft tissue edema. No soft tissue air or radiopaque foreign body. IMPRESSION: Generalized soft tissue edema. No soft tissue air or radiopaque foreign body. No osseous abnormality. Electronically Signed   By: Keith Rake M.D.   On: 08/03/2019 19:38  MR HIP RIGHT WO CONTRAST  Result Date: 08/02/2019 CLINICAL DATA:  Severe back pain radiating down the right hip and leg. Fever. EXAM: MR OF THE RIGHT HIP WITHOUT CONTRAST TECHNIQUE: Multiplanar, multisequence MR imaging was performed. No intravenous contrast was administered. COMPARISON:  CT scan of the abdomen and pelvis dated 08/02/2019 FINDINGS: Bones: There is abnormal edema in the right side of the sacrum and in the right iliac bone with fluid in the right SI joint, likely representing osteomyelitis and sepsis of the right sacroiliac joint. Articular cartilage and labrum Articular cartilage:  Normal. Labrum:  Normal. Joint or bursal effusion Joint effusion:  Trace joint effusion. Bursae: No bursitis. Muscles and tendons Muscles and tendons: Extensive right iliopsoas abscess extending from at least the L3 level to the insertion of the iliopsoas tendon on the lesser trochanter. Is extensive edema in the iliopsoas muscle consistent with myositis. There is also edema in 1 of the right hip abductor muscles with fluid along the fascia of the muscles in the anterior aspect of the proximal right thigh likely representing infectious fasciitis. There is also extension of abscess into the right gluteal muscles through the a posterior aspect of the right sacroiliac joint seen on images 1 and 2 of series 19. There is edema in the  gluteus minimus, medius, and maximus muscles consistent with myositis. There is edema in the right piriformis muscle consistent with myositis seen on image 8 of series 19. Other findings Miscellaneous:   Distended urinary bladder. IMPRESSION: 1. Extensive right iliopsoas abscess extending from at least the L3-4 level to the insertion of the iliopsoas tendon on the lesser trochanter. 2. 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 3. Myositis of the right gluteus minimus, medius, and maximus and right piriformis muscles. 4. 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 5. Abscess extends through the posterior aspect of the right SI joint into the gluteal musculature. 6. Fluid along the fascial planes in the anterior aspect of the proximal right thigh likely representing infectious fasciitis. 7. MRI with contrast may better define the extent of the infection and provide a baseline for a serial evaluation. Electronically Signed   By: Lorriane Shire M.D.   On: 08/02/2019 13:45   DG Chest Port 1 View  Result Date: 08/03/2019 CLINICAL DATA:  Status post central line placement. EXAM: PORTABLE CHEST 1 VIEW COMPARISON:  Single-view of the chest 06/10/2013. FINDINGS: Left IJ approach central venous catheter is in place with the tip projecting in the right atrium. Recommend 3 cm withdrawal. Elevation of the right hemidiaphragm is noted. There is mild basilar atelectasis. No pneumothorax or pleural effusion. Heart size is normal. IMPRESSION: Left IJ catheter tip projects in the right atrium. Recommend withdrawal of 3 cm. Negative for pneumothorax. No acute disease. Electronically Signed   By: Inge Rise M.D.   On: 08/03/2019 12:20   ECHOCARDIOGRAM LIMITED  Result Date: 08/03/2019    ECHOCARDIOGRAM LIMITED REPORT   Patient Name:   Leslie Molina Date of Exam: 08/03/2019 Medical Rec #:  [id number]  Height:       61.0 in Accession #:    [id number] Weight:       145.0 lb Date of Birth:  Dec 16, 1985  BSA:           1.647 m Patient Age:    33 years   BP:           87/42 mmHg Patient Gender: F  HR:           121 bpm. Exam Location:  [facility] Procedure: 2D Echo, Cardiac Doppler and Color Doppler Indications:     Endocarditis 138  History:         Patient has no prior history of Echocardiogram examinations.                  Anxiety, bipolar 1 disorder, migraine.  Sonographer:     Sherrie Sport RDCS (AE) Referring Phys:  [place] Diagnosing Phys: Bartholome Bill MD IMPRESSIONS  1. No evidence of vegetations. Somewhat tachycardic but able to see valves fairly well.  2. Left ventricular ejection fraction, by estimation, is 65 to 70%. The left ventricle has normal function. Left ventricular diastolic parameters were normal.  3. Right ventricular systolic function is normal. The right ventricular size is mildly enlarged. There is moderately elevated pulmonary artery systolic pressure.  4. Left atrial size was mildly dilated.  5. The mitral valve is grossly normal. Mild mitral valve regurgitation.  6. The aortic valve is grossly normal. Aortic valve regurgitation is trivial. Conclusion(s)/Recommendation(s): No evidence of valvular vegetations on this transthoracic echocardiogram. Would recommend a transesophageal echocardiogram to exclude infective endocarditis if clinically indicated. FINDINGS  Left Ventricle: Left ventricular ejection fraction, by estimation, is 65 to 70%. The left ventricle has normal function. The left ventricular internal cavity size was normal in size. There is no left ventricular hypertrophy. Right Ventricle: The right ventricular size is mildly enlarged. No increase in right ventricular wall thickness. Right ventricular systolic function is normal. There is moderately elevated pulmonary artery systolic pressure. The tricuspid regurgitant velocity is 3.11 m/s, and with an assumed right atrial pressure of 10 mmHg, the estimated right ventricular systolic pressure is [id number] mmHg. Left Atrium:  Left atrial size was mildly dilated. Pericardium: There is no evidence of pericardial effusion. Mitral Valve: The mitral valve is grossly normal. Mild mitral valve regurgitation. There is no evidence of mitral valve vegetation. Tricuspid Valve: The tricuspid valve is grossly normal. Tricuspid valve regurgitation is mild. Aortic Valve: The aortic valve is grossly normal. Aortic valve regurgitation is trivial. There is no evidence of aortic valve vegetation. Pulmonic Valve: The pulmonic valve was grossly normal. Pulmonic valve regurgitation is trivial. There is no evidence of pulmonic valve vegetation. Aorta: The aortic root is normal in size and structure.  LEFT VENTRICLE PLAX 2D LVIDd:         4.82 cm LVIDs:         2.60 cm LV PW:         1.11 cm LV IVS:        0.95 cm LVOT diam:     2.20 cm LVOT Area:     3.80 cm  LEFT ATRIUM         Index LA diam:    4.10 cm 2.49 cm/m                        PULMONIC VALVE AORTA                 PV Vmax:        1.00 m/s Ao Root diam: 2.60 cm PV Peak grad:   4.0 mmHg                       RVOT Peak grad: 8 mmHg  TRICUSPID VALVE TR Peak grad:   38.7 mmHg TR Vmax:  311.00 cm/s  SHUNTS Systemic Diam: 2.20 cm Bartholome Bill MD Electronically signed by Bartholome Bill MD Signature Date/Time: 08/03/2019/11:20:44 AM    Final     Review of Systems  Constitutional: Positive for chills, diaphoresis and fever.  HENT: Negative for ear discharge, ear pain, hearing loss and tinnitus.   Eyes: Negative for photophobia and pain.  Respiratory: Negative for cough and shortness of breath.   Cardiovascular: Negative for chest pain.  Gastrointestinal: Negative for abdominal pain, nausea and vomiting.  Genitourinary: Negative for dysuria, flank pain, frequency and urgency.  Musculoskeletal: Positive for arthralgias (Right hip) and back pain. Negative for myalgias and neck pain.  Neurological: Negative for dizziness and headaches.  Hematological: Does not bruise/bleed easily.   Psychiatric/Behavioral: The patient is not nervous/anxious.    Blood pressure 104/61, pulse (!) 121, temperature (!) 102.6 F (39.2 C), temperature source Oral, resp. rate (!) 27, SpO2 91 %. Physical Exam  Constitutional: She appears well-developed and well-nourished. No distress.  HENT:  Head: Normocephalic and atraumatic.  Eyes: Conjunctivae are normal. Right eye exhibits no discharge. Left eye exhibits no discharge. No scleral icterus.  Cardiovascular: Normal rate and regular rhythm.  Respiratory: Effort normal. No respiratory distress.  Musculoskeletal:     Cervical back: Normal range of motion.     Comments: RLE No traumatic wounds, ecchymosis, or rash  Mod TTP gluteal region, no pain with PROM, mod pain with AROM of hip  No knee or ankle effusion  Knee stable to varus/ valgus and anterior/posterior stress  Sens DPN, SPN, TN intact  Motor EHL, ext, flex, evers 5/5  DP 2+, PT 2+, No significant edema  Neurological: She is alert.  Skin: Skin is warm and dry. She is not diaphoretic.  Psychiatric: She has a normal mood and affect. Her behavior is normal.    Assessment/Plan: Right iliopsoas abscess -- Beyond the aspiration from IR done 3/3, which grew MRSA, there is no operative indication at this time. If she fails to improve on targeted abx therapy a repeat MRI may be required to see if she has another drainable fluid collection. Multiple medical problems including bipolar disorder, anxiety, and hx/o IVDU -- per primary service      Lisette Abu, PA-C Orthopedic Surgery [phone] 08/04/2019, 9:35 AM

## 2019-08-05 LAB — IGG 1, 2, 3, AND 4
IgG (Immunoglobin G), Serum: 735 mg/dL (ref 586–1602)
IgG, Subclass 1: 330 mg/dL (ref 248–810)
IgG, Subclass 2: 292 mg/dL (ref 130–555)
IgG, Subclass 3: 51 mg/dL (ref 15–102)
IgG, Subclass 4: 36 mg/dL (ref 2–96)

## 2019-08-05 LAB — COMPREHENSIVE METABOLIC PANEL
ALT: 36 U/L (ref 0–44)
AST: 56 U/L — ABNORMAL HIGH (ref 15–41)
Albumin: 1.7 g/dL — ABNORMAL LOW (ref 3.5–5.0)
Alkaline Phosphatase: 121 U/L (ref 38–126)
Anion gap: 12 (ref 5–15)
BUN: <5 mg/dL — ABNORMAL LOW (ref 6–20)
CO2: 25 mmol/L (ref 22–32)
Calcium: 7.6 mg/dL — ABNORMAL LOW (ref 8.9–10.3)
Chloride: 98 mmol/L (ref 98–111)
Creatinine, Ser: 0.58 mg/dL (ref 0.44–1.00)
GFR calc Af Amer: >60 mL/min (ref >60)
GFR calc non Af Amer: >60 mL/min (ref >60)
Glucose, Bld: 149 mg/dL — ABNORMAL HIGH (ref 70–99)
Potassium: 3.1 mmol/L — ABNORMAL LOW (ref 3.5–5.1)
Sodium: 135 mmol/L (ref 135–145)
Total Bilirubin: 0.5 mg/dL (ref 0.3–1.2)
Total Protein: 5.2 g/dL — ABNORMAL LOW (ref 6.5–8.1)

## 2019-08-05 LAB — CBC
HCT: 28.2 % — ABNORMAL LOW (ref 36.0–46.0)
Hemoglobin: 9.5 g/dL — ABNORMAL LOW (ref 12.0–15.0)
MCH: 28.4 pg (ref 26.0–34.0)
MCHC: 33.7 g/dL (ref 30.0–36.0)
MCV: 84.4 fL (ref 80.0–100.0)
Platelets: 386 10*3/uL (ref 150–400)
RBC: 3.34 MIL/uL — ABNORMAL LOW (ref 3.87–5.11)
RDW: 14.8 % (ref 11.5–15.5)
WBC: 11 10*3/uL — ABNORMAL HIGH (ref 4.0–10.5)
nRBC: 0 % (ref 0.0–0.2)

## 2019-08-05 LAB — CULTURE, BLOOD (ROUTINE X 2)
Special Requests: ADEQUATE
Special Requests: ADEQUATE

## 2019-08-05 LAB — C-REACTIVE PROTEIN: CRP: 25.6 mg/dL — ABNORMAL HIGH (ref <1.0)

## 2019-08-05 LAB — MAGNESIUM: Magnesium: 1.8 mg/dL (ref 1.7–2.4)

## 2019-08-05 LAB — HEPATITIS PANEL, ACUTE
HCV Ab: NONREACTIVE
Hep A IgM: NONREACTIVE
Hep B C IgM: NONREACTIVE
Hepatitis B Surface Ag: NONREACTIVE

## 2019-08-05 LAB — SEDIMENTATION RATE: Sed Rate: 84 mm/hr — ABNORMAL HIGH (ref 0–22)

## 2019-08-05 LAB — VANCOMYCIN, TROUGH: Vancomycin Tr: 4 ug/mL — ABNORMAL LOW (ref 15–20)

## 2019-08-05 LAB — VANCOMYCIN, PEAK: Vancomycin Pk: 14 ug/mL — ABNORMAL LOW (ref 30–40)

## 2019-08-05 MED ORDER — VANCOMYCIN HCL IN DEXTROSE 1-5 GM/200ML-% IV SOLN
1000.0000 mg | Freq: Three times a day (TID) | INTRAVENOUS | Status: DC
  Administered 2019-08-05 – 2019-08-06 (×4): 1000 mg via INTRAVENOUS
  Filled 2019-08-05 (×6): qty 200

## 2019-08-05 MED ORDER — POTASSIUM CHLORIDE 2 MEQ/ML IV SOLN
INTRAVENOUS | Status: DC
  Filled 2019-08-05 (×2): qty 1000

## 2019-08-05 MED ORDER — IBUPROFEN 200 MG PO TABS
400.0000 mg | ORAL_TABLET | Freq: Three times a day (TID) | ORAL | Status: DC | PRN
  Administered 2019-08-05 – 2019-09-01 (×9): 400 mg via ORAL
  Filled 2019-08-05 (×9): qty 2

## 2019-08-05 MED ORDER — LACTATED RINGERS IV BOLUS
500.0000 mL | Freq: Once | INTRAVENOUS | Status: AC
Start: 2019-08-05 — End: 2019-08-05
  Administered 2019-08-05: 500 mL via INTRAVENOUS

## 2019-08-05 NOTE — Progress Notes (Signed)
PULMONARY / CRITICAL CARE MEDICINE   NAME:  Leslie Molina, MRN:  [id number], DOB:  16-Feb-1986, LOS: 2 ADMISSION DATE:  08/03/2019,  REFERRING MD:  Kurtis Bushman, MD, [facility], CHIEF COMPLAINT: MRSA bacteremia  BRIEF HISTORY:    34 year old woman, ex-IVDU presented to [facility] with complaints of lower back pain, abdominal pain and pain in her lower extremity.  Found to have MRSA bacteremia, imaging showing osteomyelitis of sacrum, psoas initial abscess and perforated uterus from IUD.  Hence transferred to [facility] for surgical/GYN input   HISTORY OF PRESENT ILLNESS   Leslie Molina is a 34 yo female with PMX significant forwho presented to the ED with complaints of persisted lower back pain and worsening right lower extremity pain. Per review of medical records patient had been seen in the ED twice the week prior to admission for similar complaints. Pain rated 10/10 at its worst. She denies any chest pain,. cough, shortness of breath, nausea, vomiting, or lower extremity edema .   On admission she was seen febrile with a Tmax of 102 and tachycardic. Labwork significant for sodium 128, potassium 3.0, Chloride 89, Lactic WNL, procalcitonin 5.99, and  WBC 14.0. CT chest/abdomen/plevis on admission concerning for infectious myositis. Patient was admitted to critical care for sepsis workup. She had CT guided need aspiration of abscess evening of admission.   Further work during admission with MRI right hip and lumbar spine revealed extensive myositis of the right iliopsoas muscle, myositis of the gluteus medius, maximus, and minimus as well as osteomyelitis of the right side of the sacrum and iliac bone. Patient was transferred to [facility] for higher level of care including surgical/GYN/ and orthopedic consultations   SIGNIFICANT PAST MEDICAL HISTORY   IVDU (has no usd within the last year per patient) Migraines Depression Bipolar disease Anxiety   SIGNIFICANT EVENTS:  Admitted [place] 3/3 Transferred [facility] 3/4 CT  guided need aspiration of abscess 3/3  STUDIES:   Lumbar spine xray 3/3 > Mild facet sclerosis at L5-S1.  CT Chest/Abdomen/Plevis 3/3 >  1. Heterogeneous enhancement and severe expansion of the right psoas and iliacus muscles with mild adjacent soft tissue edema. Overall appearance is concerning for infectious myositis versus less likely muscle strain and intramuscular hemorrhage. Subtle small hypodensities within the iliopsoas muscle measuring up to 13 mm concerning for an intramuscular abscess. The lumbar spine demonstrates no focal abnormality to suggest discitis or osteomyelitis, but MRI of the lumbar spine without and with intravenous contrast is recommended for increased sensitivity if there is concern for discitis/osteomyelitis. 2. T-type IUD within the uterus. A tip of 'T' has perforated through the uterine wall and projects into the peroneal cavity. 3. No acute cardiopulmonary disease.  MRI right hip 3/3 WO contrast  >  1. Extensive right iliopsoas abscess extending from at least the L3-4 level to the insertion of the iliopsoas tendon on the lesser trochanter. 2. 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 3. Myositis of the right gluteus minimus, medius, and maximus and right piriformis muscles. 4. 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 5. Abscess extends through the posterior aspect of the right SI joint into the gluteal musculature. 6. Fluid along the fascial planes in the anterior aspect of the proximal right thigh likely representing infectious fasciitis. 7. MRI with contrast may better define the extent of the infection and provide a baseline for a serial evaluation.  MRI lumbar spine WO contrast 3/3 > 1. Limited study due to patient motion artifact. Patient was unable  to tolerate any postcontrast imaging. 2. Findings suggestive of infectious right-sided sacroiliitis with associated myositis and intramuscular abscesses involving  the right psoas and iliacus musculature. Please see dedicated MRI of the pelvis and right hip which more fully images the musculature for further detail. 3. Negative for osteomyelitis-discitis. 4. Slightly heterogeneous appearance of the distal lumbar canal below the L4-5 level on STIR sequences, which is favored to be secondary to a combination of prominent epidural fat and motion artifact. Postcontrast imaging would be helpful to exclude the presence of epidural extension of infection. 5. No significant disc disease. No evidence of nerve impingement.  MR lumbar spine W contrast 3/4 > 1. 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 2. Extensive right iliopsoas abscess. 3. Edema and enhancement of the right gluteal muscles and right multifidus muscle at the L5-S1 level consistent with myositis. 4. No evidence of epidural extension of abscess at this time. CULTURES:  COVID 3/3 > negative  MRSA PCR 3/3 > Positive  Blood culture 3/3 >  MRSA  Pelvic abcess culture 3/3 >MRSA 3/4 blood: gpc  ANTIBIOTICS:  Daptomycin 3/3 Cefepime 3/3-> Clindamycin 3/3-> vanc 3/4->  LINES/TUBES:   [facility] 3/4 >>  CONSULTANTS:  Gyn Ortho ID gen surgery  SUBJECTIVE:  Feels somewhat better today as less systemically unwell but still having RLQ pain  CONSTITUTIONAL: BP 118/70 (BP Location: Left Arm)   Pulse (!) 131   Temp (!) 102 F (38.9 C) (Oral)   Resp (!) 22   Ht 5\' 1"  (1.549 m)   Wt 80.2 kg   LMP  (LMP Unknown)   SpO2 93%   BMI 33.41 kg/m   I/O last 3 completed shifts: In: 4124.2 [P.O.:480; I.V.:2499.9; IV Piggyback:1144.3] Out: 4750 [Urine:4750]        PHYSICAL EXAM: General: Young woman, calm resting comfortably in bed, nad Neuro: Alert, interactive, nonfocal, no meningeal signs HEENT: No JVD, no icterus or pallor, pupils 3 mm RTL, focal tenderness over right sternomastoid Cardiovascular: S1-S2 tachy, no murmur Lungs: Clear breath  sounds bilateral Abdomen: Soft, mildly distended, mildly tender to palp RLQ, no guarding Musculoskeletal: Tender swelling over dorsum of right hand Skin: Psoriatic patches over both legs, multiple tattoos and on face  Lab Results  Component Value Date   WBC 11.0 (H) 08/05/2019   HGB 9.5 (L) 08/05/2019   HCT 28.2 (L) 08/05/2019   MCV 84.4 08/05/2019   PLT 386 08/05/2019   Lab Results  Component Value Date   CREATININE 0.58 08/05/2019   BUN <5 (L) 08/05/2019   NA 135 08/05/2019   K 3.1 (L) 08/05/2019   CL 98 08/05/2019   CO2 25 08/05/2019     RESOLVED PROBLEM LIST   ASSESSMENT AND PLAN   Source of MRSA is not clear, infected IUD is the main concern, she denies ongoing IVDU, has been clean "for a year"   Sepsis with MRSA bacteremia  -Criteria: Febrile with Tmax 102.6, tachycardia, tachypnea, leukocytosis, and confirmed infection  -Has seeded multiple areas including osteomyelitis and iliopsoas and gluteal abscess  P: -Continue IV fluids and medical management.  - Consider operative drainage of SI joint if continues to show signs of sepsis beyond 48h or worsens   Iliopsoas and gluteal abscesses Osteomyelitis of sacrum and ischium Possible infection over R hand dorsum and R Sternoclav joint -Seen on MRI right hip and lumbar spine  P: ID to see Orthopedic consult appreciated Ck 410->  Peritonitis  IUD perf noted on CT P:  Consulted GYN / surgery Npo for now Morphine as needed -no acute surgical indication   Hypokalemia  P: Supplement po and IV as remaining low Trend Bmet -mag ok  Bipolar disease  Anxiety Depression P: Hold home Cymbalta, Seroquel, Trazadone    Best Practice / Goals of Care / Disposition.   DVT PROPHYLAXIS: Lovenox SUP: Protonix NUTRITION: clear to full liquids as tolerated without plans for surgical intervention MOBILITY: Bedrest GOALS OF CARE: N/A FAMILY DISCUSSIONS: N/A DISPOSITION intermediate care  LABS  Glucose Recent  Labs  Lab 08/02/19 2113  GLUCAP 127*    BMET Recent Labs  Lab 08/03/19 1847 08/04/19 0500 08/05/19 0530  NA 134* 136 135  K 2.9* 2.8* 3.1*  CL 102 102 98  CO2 22 23 25   BUN 6 5* <5*  CREATININE 0.55 0.54 0.58  GLUCOSE 121* 111* 149*    Liver Enzymes Recent Labs  Lab 08/02/19 0843 08/03/19 0250 08/05/19 0530  AST 30 24 56*  ALT 24 18 36  ALKPHOS 103 83 121  BILITOT 0.5 0.5 0.5  ALBUMIN 2.9* 2.3* 1.7*    Electrolytes Recent Labs  Lab 08/03/19 0250 08/03/19 0250 08/03/19 1847 08/04/19 0500 08/05/19 0530  CALCIUM 7.6*   < > 7.6* 7.7* 7.6*  MG 2.3  --  1.9  --  1.8  PHOS  --   --  2.2*  --   --    < > = values in this interval not displayed.    CBC Recent Labs  Lab 08/03/19 1847 08/04/19 0500 08/05/19 0530  WBC 12.1* 13.0* 11.0*  HGB 9.7* 9.7* 9.5*  HCT 28.3* 29.3* 28.2*  PLT 280 303 386    ABG No results for input(s): PHART, PCO2ART, PO2ART in the last 168 hours.  Coag's Recent Labs  Lab 08/03/19 0250  INR 1.3*    Sepsis Markers Recent Labs  Lab 08/02/19 1130 08/03/19 0250 08/03/19 1847  LATICACIDVEN 1.4  --  0.8  PROCALCITON  --  5.99  --     Cardiac Enzymes No results for input(s): TROPONINI, PROBNP in the last 168 hours.      Kipp Brood, MD [facility] ICU Physician [place]  Pager: [phone] Mobile: [phone] After hours: [phone].  08/05/2019, 4:37 PM

## 2019-08-05 NOTE — Progress Notes (Signed)
Her temperature went up to 102.3. Administered acetaminophen. HR staying in 130s. Paged Kary Kos, NP.  Received order for ibuprofen prn q8.  Also received order to remove her foley.  Idolina Primer, RN

## 2019-08-05 NOTE — Progress Notes (Signed)
A/Ox4.  States she feels little better today. HR staying in 130s-140s while in bed.  States she has been moving in bed to make herself comfortable.   Pt also c/o congestion and difficulty breathing.  Critical care was made of above.  Idolina Primer, RN

## 2019-08-05 NOTE — Progress Notes (Signed)
Pharmacy Antibiotic Note  Leslie Molina is a 34 y.o. female admitted with MRSA bacteremia/abscess w/ osteo ofof iliac bone and sacrum.  Pharmacy dosing vancomycin  -vancomycin peak= 14, trough= 4 -SCr= 0.5   Plan: -Change vancomycin to 1000mg  IV 8h -Will follow renal function, cultures and clinical progress   Height: 5\' 1"  (154.9 cm) Weight: 176 lb 12.9 oz (80.2 kg) IBW/kg (Calculated) : 47.8  Temp (24hrs), Avg:100.2 F (37.9 C), Min:99.1 F (37.3 C), Max:102.1 F (38.9 C)  Recent Labs  Lab 08/02/19 0843 08/02/19 1130 08/03/19 0250 08/03/19 1847 08/04/19 0500 08/05/19 0100 08/05/19 0529 08/05/19 0530  WBC 14.0*  --   --  12.1* 13.0*  --   --  11.0*  CREATININE 0.60  --  0.50 0.55 0.54  --   --  0.58  LATICACIDVEN  --  1.4  --  0.8  --   --   --   --   VANCOTROUGH  --   --   --   --   --   --  4*  --   VANCOPEAK  --   --   --   --   --  14*  --   --     Estimated Creatinine Clearance: 95.1 mL/min (by C-G formula based on SCr of 0.58 mg/dL).    No Known Allergies  Antimicrobials this admission: 3/3 metronidazole x 1 3/3 vancomycin >>  3/3 cefepime >> 3/4 daptomycin 500mg  x 1   Dose adjustments this admission:  Microbiology results: 3/3 BCx: GPC clusters 4/4 bottles  (BCID = MRSA) 3/3 iliopsoas abscess: MRSA  Thank you for allowing pharmacy to be a part of this patient's care.  Hildred Laser, PharmD Clinical Pharmacist **Pharmacist phone directory can now be found on [email or web address] ([email or web address]).  Listed under [place].

## 2019-08-06 ENCOUNTER — Inpatient Hospital Stay (HOSPITAL_COMMUNITY): Payer: Medicaid Other

## 2019-08-06 ENCOUNTER — Encounter (HOSPITAL_COMMUNITY): Payer: Self-pay | Admitting: Pulmonary Disease

## 2019-08-06 DIAGNOSIS — A4101 Sepsis due to Methicillin susceptible Staphylococcus aureus: Secondary | ICD-10-CM

## 2019-08-06 LAB — CULTURE, BLOOD (ROUTINE X 2)
Special Requests: ADEQUATE
Special Requests: ADEQUATE

## 2019-08-06 LAB — COMPREHENSIVE METABOLIC PANEL
ALT: 48 U/L — ABNORMAL HIGH (ref 0–44)
AST: 57 U/L — ABNORMAL HIGH (ref 15–41)
Albumin: 1.8 g/dL — ABNORMAL LOW (ref 3.5–5.0)
Alkaline Phosphatase: 127 U/L — ABNORMAL HIGH (ref 38–126)
Anion gap: 11 (ref 5–15)
BUN: <5 mg/dL — ABNORMAL LOW (ref 6–20)
CO2: 28 mmol/L (ref 22–32)
Calcium: 7.7 mg/dL — ABNORMAL LOW (ref 8.9–10.3)
Chloride: 96 mmol/L — ABNORMAL LOW (ref 98–111)
Creatinine, Ser: 0.54 mg/dL (ref 0.44–1.00)
GFR calc Af Amer: >60 mL/min (ref >60)
GFR calc non Af Amer: >60 mL/min (ref >60)
Glucose, Bld: 141 mg/dL — ABNORMAL HIGH (ref 70–99)
Potassium: 3.1 mmol/L — ABNORMAL LOW (ref 3.5–5.1)
Sodium: 135 mmol/L (ref 135–145)
Total Bilirubin: 0.5 mg/dL (ref 0.3–1.2)
Total Protein: 5.6 g/dL — ABNORMAL LOW (ref 6.5–8.1)

## 2019-08-06 LAB — CBC
HCT: 31.5 % — ABNORMAL LOW (ref 36.0–46.0)
Hemoglobin: 10.4 g/dL — ABNORMAL LOW (ref 12.0–15.0)
MCH: 28.1 pg (ref 26.0–34.0)
MCHC: 33 g/dL (ref 30.0–36.0)
MCV: 85.1 fL (ref 80.0–100.0)
Platelets: 437 10*3/uL — ABNORMAL HIGH (ref 150–400)
RBC: 3.7 MIL/uL — ABNORMAL LOW (ref 3.87–5.11)
RDW: 15.2 % (ref 11.5–15.5)
WBC: 11 10*3/uL — ABNORMAL HIGH (ref 4.0–10.5)
nRBC: 0 % (ref 0.0–0.2)

## 2019-08-06 LAB — MAGNESIUM: Magnesium: 1.9 mg/dL (ref 1.7–2.4)

## 2019-08-06 LAB — VANCOMYCIN, PEAK: Vancomycin Pk: 19 ug/mL — ABNORMAL LOW (ref 30–40)

## 2019-08-06 LAB — VANCOMYCIN, TROUGH: Vancomycin Tr: 7 ug/mL — ABNORMAL LOW (ref 15–20)

## 2019-08-06 IMAGING — CT CT NECK W/ CM
4 of 5 series · 14 of 33 positions shown, 16 images · IV contrast (Omni 300)
Comparison: CT angiogram chest [DATE]

CLINICAL DATA: Infection, soft tissue infection, abscess; soft
tissue mass, neck, no prior imaging. Additional history provided:
Swelling, pain, redness to right side of body where the neck meets
the shoulder.

EXAM:
CT NECK WITH CONTRAST
TECHNIQUE: Multidetector CT imaging of the neck was performed using the
standard protocol following the bolus administration of intravenous
contrast.
CONTRAST:  75mL OMNIPAQUE IOHEXOL 350 MG/ML SOLN

[Series 3: neck 2.0 st · axial · 0.56mm/px · z∈[-209,-63]mm · 4 of 123 slices shown, 5 images]
[im 25/123  soft-tissue]
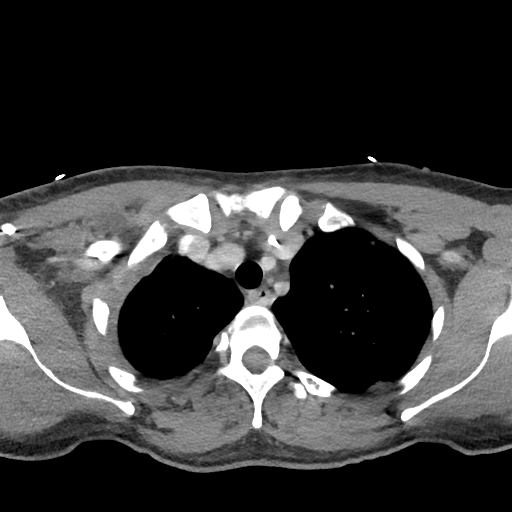
[im 25/123  bone]
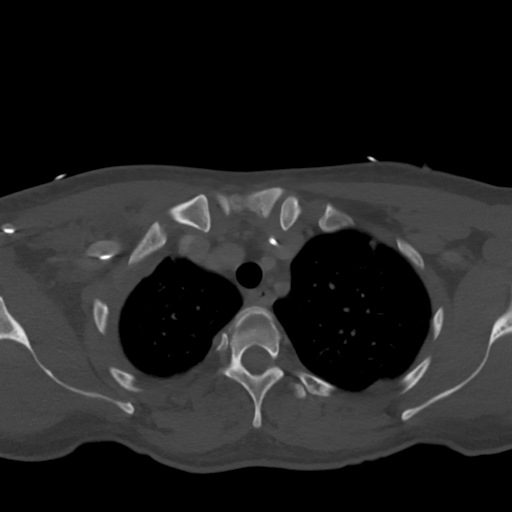
[im 49/123  bone]
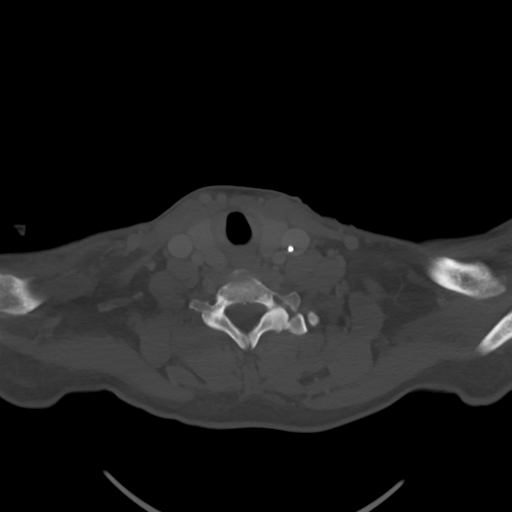
[im 74/123  bone]
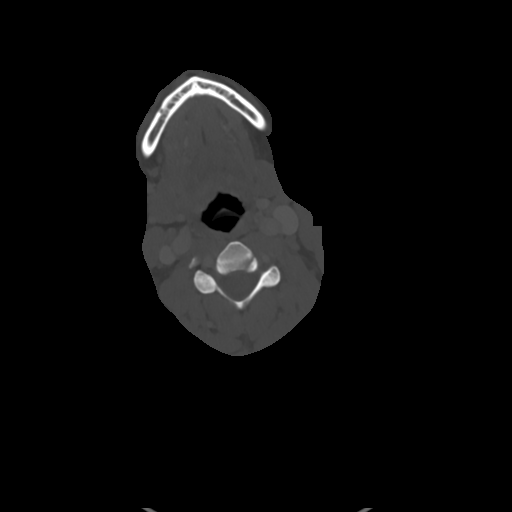
[im 98/123  bone]
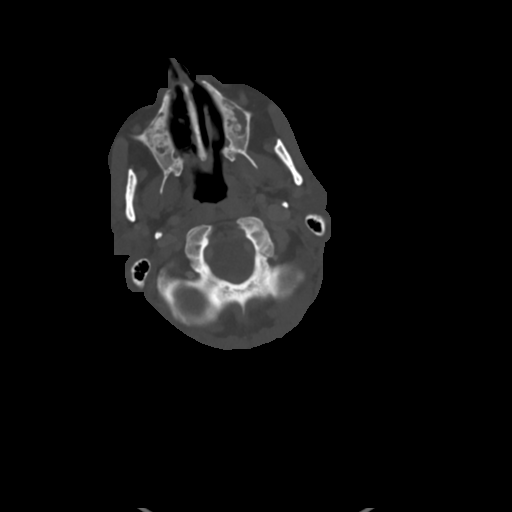

[Series 5: sagittal · sagittal · 0.52mm/px · 5 of 132 slices shown, 6 images]
[im 44/132  bone]
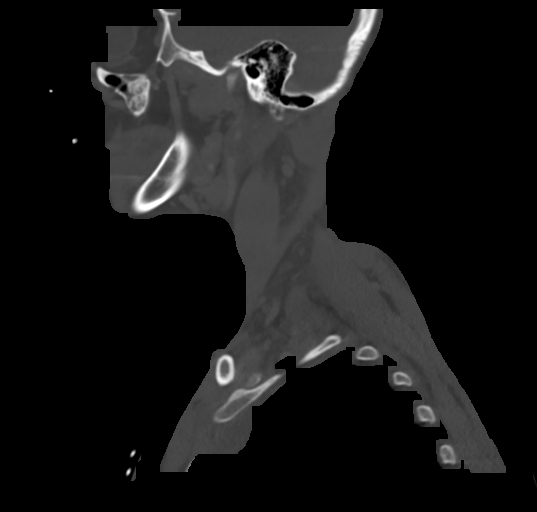
[im 55/132  bone]
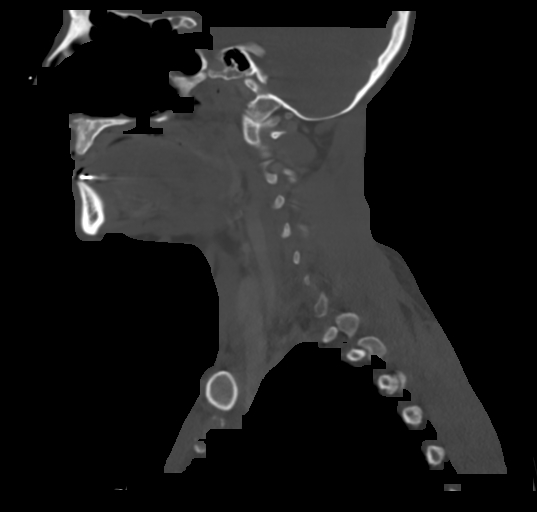
[im 66/132  soft-tissue]
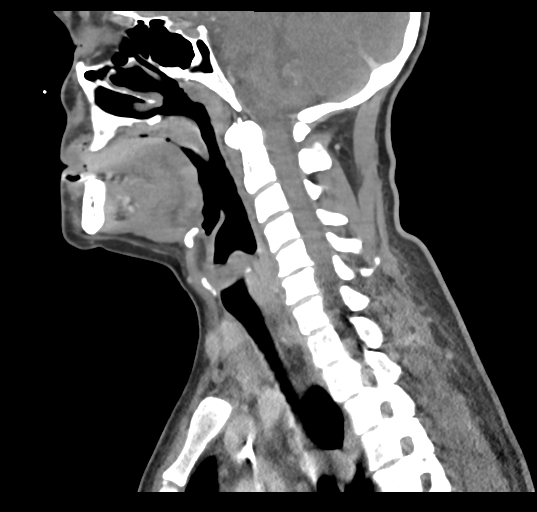
[im 66/132  bone]
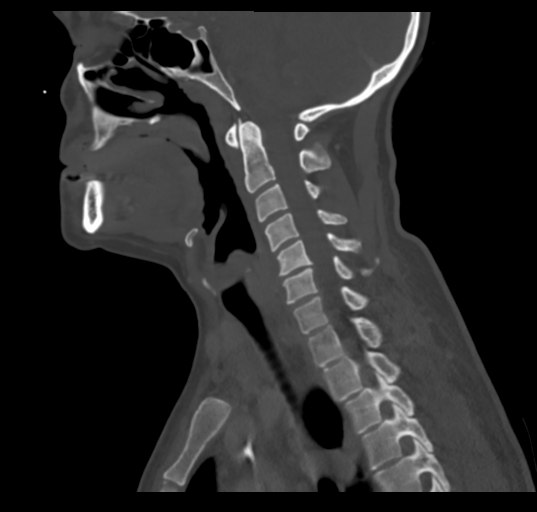
[im 77/132  bone]
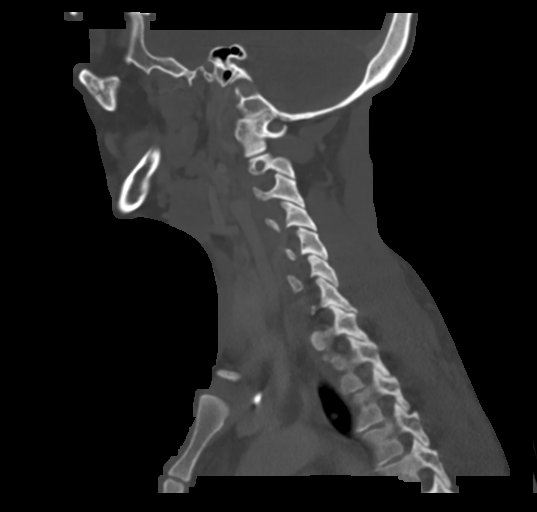
[im 88/132  bone]
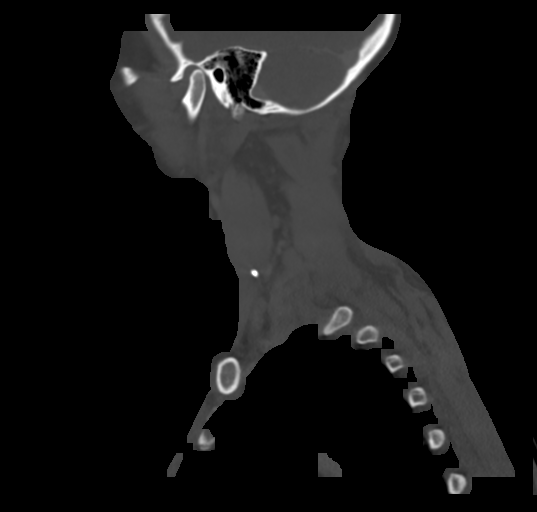

[Series 6: coronal · coronal · 0.52mm/px · 3 of 114 slices shown]
[im 23/114  bone]
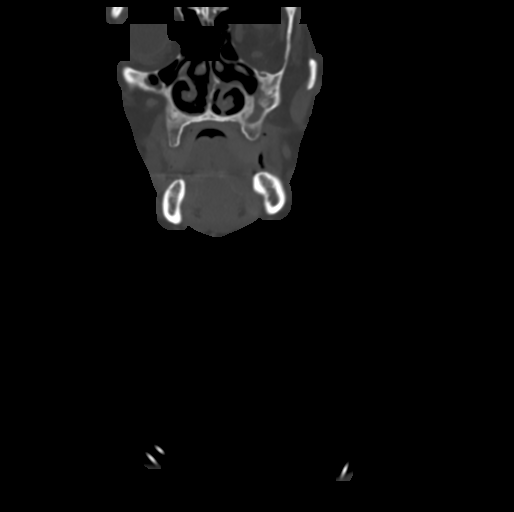
[im 46/114  bone]
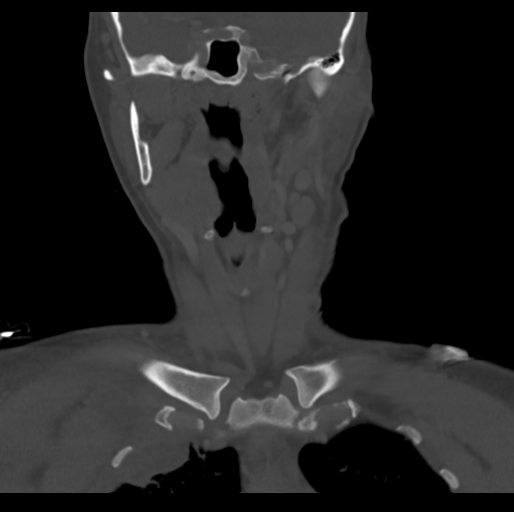
[im 68/114  bone]
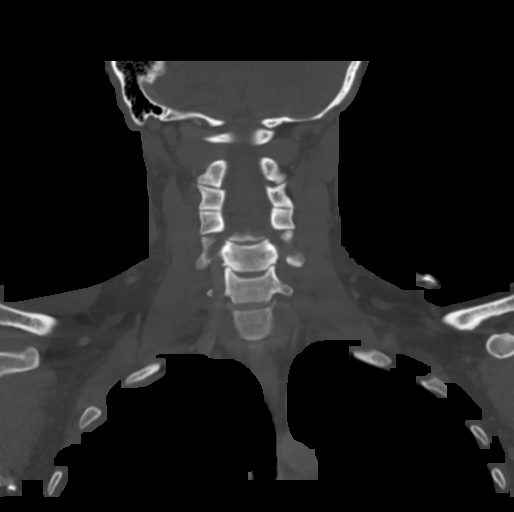

[Series 7: orthogonal · axial · 0.43mm/px · z∈[-232,-180]mm · 2 of 130 slices shown]
[im 26/130  bone]
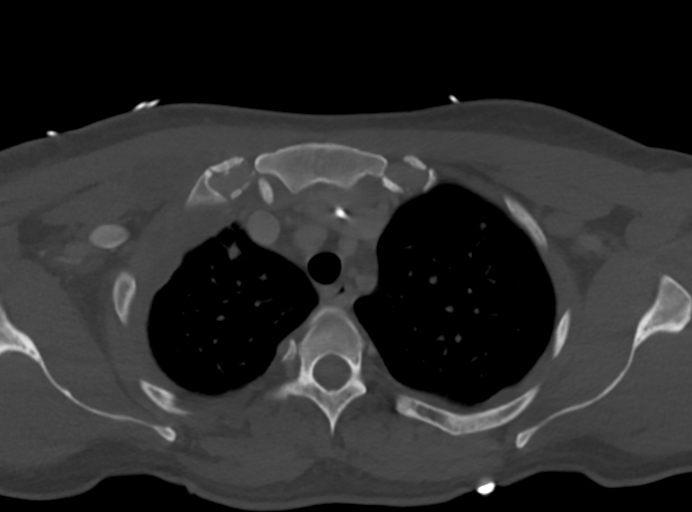
[im 52/130  bone]
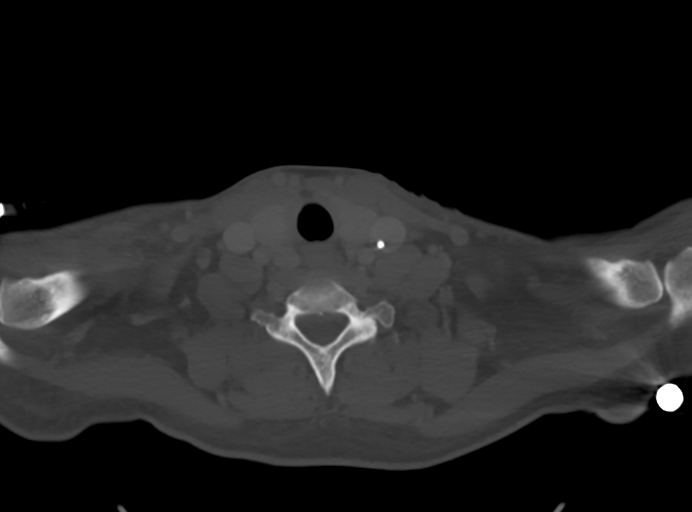

[14 of 33 positions shown; findings below may reference images not displayed]

FINDINGS: Pharynx and larynx: No appreciable mass or swelling within the oral
cavity, pharynx or larynx.

Salivary glands: No inflammation, mass, or stone.

Thyroid: Unremarkable thyroid gland.

Lymph nodes: No pathologically enlarged cervical chain lymph nodes.

Vascular: The major vascular structures of the neck appear patent.

Limited intracranial: No acute intracranial abnormality identified

Visualized orbits: Visualized orbits demonstrate no acute
abnormality.

Mastoids and visualized paranasal sinuses: The bilateral maxillary
sinuses demonstrate mild mucosal thickening and reactive osteitis.
No significant mastoid effusion.

Skeleton: No acute bony abnormality or aggressive osseous lesion.
C5-C6 disc bulge.

Upper chest: New from prior CTA chest [DATE], there are numerous
peripheral predominant pulmonary nodules within the imaged lung
apices suspicious for septic emboli. There is a loculated collection
within the right pleural space along the lateral aspect of the right
lung apex measuring 8.5 x 2.7 cm in transaxial dimensions. There is
a punctate focus of gas within this collection. Findings likely
reflect empyema. In retrospect, this finding was present on prior
examination [DATE], but has increased in size since this prior
study. No definite erosion of the adjacent ribs. There is extension
of low-density edema into the adjacent right pectoralis musculature
measuring 3.9 x 2.2 cm in transaxial dimensions (for instance as
seen series 3, image 104). Associated inflammatory stranding within
the right retroclavicular region and lower right neck. Partially
visualized left IJ approach central venous catheter.

These results will be called to the ordering clinician or
representative by the Radiologist Assistant, and communication
documented in the PACS or zVision Dashboard.
IMPRESSION: 1. Multiple peripheral predominant nodules within the imaged lung
apices, new as compared to prior examination [DATE] and highly
suspicious for septic emboli.
2. Right pleural collection containing a punctate focus of gas along
the lateral aspect of the right lung apex likely reflecting empyema.
Extension of low attenuation edema into the adjacent right
pectoralis musculature. No peripheral enhancement is demonstrated at
this site on the current examination to suggest formed abscess.
Associated inflammatory stranding within the right retroclavicular
region and right lower neck consistent with soft tissue infection.
Dedicated chest CT is recommended for further evaluation.

## 2019-08-06 MED ORDER — VANCOMYCIN HCL 2000 MG/400ML IV SOLN
2000.0000 mg | Freq: Three times a day (TID) | INTRAVENOUS | Status: DC
  Administered 2019-08-06 – 2019-08-07 (×2): 2000 mg via INTRAVENOUS
  Filled 2019-08-06 (×3): qty 400

## 2019-08-06 MED ORDER — POLYETHYLENE GLYCOL 3350 17 G PO PACK
17.0000 g | PACK | Freq: Every day | ORAL | Status: DC
  Administered 2019-08-06 – 2019-08-28 (×13): 17 g via ORAL
  Filled 2019-08-06 (×25): qty 1

## 2019-08-06 MED ORDER — IOHEXOL 350 MG/ML SOLN
75.0000 mL | Freq: Once | INTRAVENOUS | Status: AC | PRN
  Administered 2019-08-06: 75 mL via INTRAVENOUS

## 2019-08-06 MED ORDER — ALPRAZOLAM 0.5 MG PO TABS
1.0000 mg | ORAL_TABLET | Freq: Once | ORAL | Status: AC
  Administered 2019-08-06: 1 mg via ORAL
  Filled 2019-08-06: qty 2

## 2019-08-06 MED ORDER — POTASSIUM CHLORIDE CRYS ER 20 MEQ PO TBCR
40.0000 meq | EXTENDED_RELEASE_TABLET | Freq: Two times a day (BID) | ORAL | Status: AC
  Administered 2019-08-06 (×2): 40 meq via ORAL
  Filled 2019-08-06 (×3): qty 2

## 2019-08-06 MED ORDER — SODIUM CHLORIDE 0.9 % IV BOLUS
1000.0000 mL | Freq: Once | INTRAVENOUS | Status: AC
  Administered 2019-08-07: 1000 mL via INTRAVENOUS

## 2019-08-06 NOTE — Progress Notes (Signed)
PULMONARY / CRITICAL CARE MEDICINE   NAME:  Jansyn Mesecher, MRN:  [id number], DOB:  12-29-1985, LOS: 3 ADMISSION DATE:  08/03/2019,  REFERRING MD:  Kurtis Bushman, MD, [facility], CHIEF COMPLAINT: MRSA bacteremia  BRIEF HISTORY:    34 year old woman, ex-IVDU presented to [facility] with complaints of lower back pain, abdominal pain and pain in her lower extremity.  Found to have MRSA bacteremia, imaging showing osteomyelitis of sacrum, psoas initial abscess and perforated uterus from IUD.  Hence transferred to [facility] for surgical/GYN input   HISTORY OF PRESENT ILLNESS   Matalynn Claes is a 34 yo female with PMX significant forwho presented to the ED with complaints of persisted lower back pain and worsening right lower extremity pain. Per review of medical records patient had been seen in the ED twice the week prior to admission for similar complaints. Pain rated 10/10 at its worst. She denies any chest pain,. cough, shortness of breath, nausea, vomiting, or lower extremity edema .   On admission she was seen febrile with a Tmax of 102 and tachycardic. Labwork significant for sodium 128, potassium 3.0, Chloride 89, Lactic WNL, procalcitonin 5.99, and  WBC 14.0. CT chest/abdomen/plevis on admission concerning for infectious myositis. Patient was admitted to critical care for sepsis workup. She had CT guided need aspiration of abscess evening of admission.   Further work during admission with MRI right hip and lumbar spine revealed extensive myositis of the right iliopsoas muscle, myositis of the gluteus medius, maximus, and minimus as well as osteomyelitis of the right side of the sacrum and iliac bone. Patient was transferred to [facility] for higher level of care including surgical/GYN/ and orthopedic consultations   SIGNIFICANT PAST MEDICAL HISTORY   IVDU (has no usd within the last year per patient) Migraines Depression Bipolar disease Anxiety   SIGNIFICANT EVENTS:  Admitted [place] 3/3 Transferred [facility] 3/4 CT  guided need aspiration of abscess 3/3  STUDIES:   Lumbar spine xray 3/3 > Mild facet sclerosis at L5-S1.  CT Chest/Abdomen/Plevis 3/3 >  1. Heterogeneous enhancement and severe expansion of the right psoas and iliacus muscles with mild adjacent soft tissue edema. Overall appearance is concerning for infectious myositis versus less likely muscle strain and intramuscular hemorrhage. Subtle small hypodensities within the iliopsoas muscle measuring up to 13 mm concerning for an intramuscular abscess. The lumbar spine demonstrates no focal abnormality to suggest discitis or osteomyelitis, but MRI of the lumbar spine without and with intravenous contrast is recommended for increased sensitivity if there is concern for discitis/osteomyelitis. 2. T-type IUD within the uterus. A tip of 'T' has perforated through the uterine wall and projects into the peroneal cavity. 3. No acute cardiopulmonary disease.  MRI right hip 3/3 WO contrast  >  1. Extensive right iliopsoas abscess extending from at least the L3-4 level to the insertion of the iliopsoas tendon on the lesser trochanter. 2. 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 3. Myositis of the right gluteus minimus, medius, and maximus and right piriformis muscles. 4. 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 5. Abscess extends through the posterior aspect of the right SI joint into the gluteal musculature. 6. Fluid along the fascial planes in the anterior aspect of the proximal right thigh likely representing infectious fasciitis. 7. MRI with contrast may better define the extent of the infection and provide a baseline for a serial evaluation.  MRI lumbar spine WO contrast 3/3 > 1. Limited study due to patient motion artifact. Patient was unable  to tolerate any postcontrast imaging. 2. Findings suggestive of infectious right-sided sacroiliitis with associated myositis and intramuscular abscesses involving  the right psoas and iliacus musculature. Please see dedicated MRI of the pelvis and right hip which more fully images the musculature for further detail. 3. Negative for osteomyelitis-discitis. 4. Slightly heterogeneous appearance of the distal lumbar canal below the L4-5 level on STIR sequences, which is favored to be secondary to a combination of prominent epidural fat and motion artifact. Postcontrast imaging would be helpful to exclude the presence of epidural extension of infection. 5. No significant disc disease. No evidence of nerve impingement.  MR lumbar spine W contrast 3/4 > 1. 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 2. Extensive right iliopsoas abscess. 3. Edema and enhancement of the right gluteal muscles and right multifidus muscle at the L5-S1 level consistent with myositis. 4. No evidence of epidural extension of abscess at this time. CULTURES:  COVID 3/3 > negative  MRSA PCR 3/3 > Positive  Blood culture 3/3 >  MRSA  Pelvic abcess culture 3/3 >MRSA 3/4 blood: MRSA 3/6 blood: NGTD  ANTIBIOTICS:  Daptomycin 3/3 Cefepime 3/3 Clindamycin 3/3 vanc 3/4->  LINES/TUBES:   [facility] 3/4 >>  CONSULTANTS:  Gyn Ortho ID gen surgery signed off 3/5  SUBJECTIVE:  3/7: doing ok today. Remains slightly tachycardic but BP stable. Feels bowels may move soon but still no bm yet, requesting additional agent. CCM will sign off and have TRH assume care.   3/6: Feels somewhat better today as less systemically unwell but still having RLQ pain  CONSTITUTIONAL: BP 104/64 (BP Location: Right Arm)   Pulse (!) 115   Temp (!) 101.3 F (38.5 C) (Oral)   Resp 18   Ht 5\' 1"  (1.549 m)   Wt 73.6 kg   LMP  (LMP Unknown)   SpO2 95%   BMI 30.67 kg/m   I/O last 3 completed shifts: In: 5579.9 [P.O.:1140; I.V.:3239.9; IV Piggyback:1200] Out: 6900 [Urine:6900]        PHYSICAL EXAM: General: Young woman, calm resting comfortably in  bed, nad Neuro: Alert, interactive, nonfocal, no meningeal signs HEENT: No JVD, no icterus or pallor, pupils 3 mm RTL, focal tenderness over right sternomastoid Cardiovascular: S1-S2 tachy, no murmur Lungs: Clear breath sounds bilateral Abdomen: Soft, mildly distended, mildly tender to palp RLQ, no guarding Musculoskeletal: Tender swelling over dorsum of right hand Skin: Psoriatic patches over both legs, multiple tattoos and on face  Lab Results  Component Value Date   WBC 11.0 (H) 08/06/2019   HGB 10.4 (L) 08/06/2019   HCT 31.5 (L) 08/06/2019   MCV 85.1 08/06/2019   PLT 437 (H) 08/06/2019   Lab Results  Component Value Date   CREATININE 0.54 08/06/2019   BUN <5 (L) 08/06/2019   NA 135 08/06/2019   K 3.1 (L) 08/06/2019   CL 96 (L) 08/06/2019   CO2 28 08/06/2019     RESOLVED PROBLEM LIST   ASSESSMENT AND PLAN   Source of MRSA is not clear, infected IUD is the main concern, she denies ongoing IVDU, has been clean "for a year"   Sepsis with MRSA bacteremia  -Has seeded multiple areas including osteomyelitis and iliopsoas and gluteal abscess  P: -Continue iv abx -hopefully cx will remain negative from 3/6 and we can place picc/midline to remove cvc as pt will need long term iv abx and piv will not be adequate or easily maintained with h/o ivda - Consider operative drainage per  ortho based on clinical course -will image R hand as well as neck per ID recs.    Iliopsoas and gluteal abscesses Osteomyelitis of sacrum and ischium Possible infection over R hand dorsum and R Sternoclav joint -Seen on MRI right hip and lumbar spine  P: ID appreciated Orthopedic consult appreciated Ck [phone]  Peritonitis  IUD perf noted on CT P: Consulted GYN / surgery (surgery signed off) regular Morphine as needed -no acute surgical indication   Hypokalemia  P: Supplement po and IV as remaining low Trend Bmet -mag ok  Bipolar disease  Anxiety Depression P: Hold home  Cymbalta, Seroquel, Trazadone    Best Practice / Goals of Care / Disposition.   DVT PROPHYLAXIS: Lovenox SUP: not indicated NUTRITION: regular MOBILITY: as tolerated GOALS OF CARE: N/A FAMILY DISCUSSIONS: N/A DISPOSITION intermediate care... anticipate transfer to floor in next 24 hours Ccm will sign off and TRH to assume care.   LABS  Glucose Recent Labs  Lab 08/02/19 2113  GLUCAP 127*    BMET Recent Labs  Lab 08/04/19 0500 08/05/19 0530 08/06/19 0541  NA 136 135 135  K 2.8* 3.1* 3.1*  CL 102 98 96*  CO2 23 25 28   BUN 5* <5* <5*  CREATININE 0.54 0.58 0.54  GLUCOSE 111* 149* 141*    Liver Enzymes Recent Labs  Lab 08/03/19 0250 08/05/19 0530 08/06/19 0541  AST 24 56* 57*  ALT 18 36 48*  ALKPHOS 83 121 127*  BILITOT 0.5 0.5 0.5  ALBUMIN 2.3* 1.7* 1.8*    Electrolytes Recent Labs  Lab 08/03/19 1847 08/03/19 1847 08/04/19 0500 08/05/19 0530 08/06/19 0541  CALCIUM 7.6*   < > 7.7* 7.6* 7.7*  MG 1.9  --   --  1.8 1.9  PHOS 2.2*  --   --   --   --    < > = values in this interval not displayed.    CBC Recent Labs  Lab 08/04/19 0500 08/05/19 0530 08/06/19 0541  WBC 13.0* 11.0* 11.0*  HGB 9.7* 9.5* 10.4*  HCT 29.3* 28.2* 31.5*  PLT 303 386 437*    ABG No results for input(s): PHART, PCO2ART, PO2ART in the last 168 hours.  Coag's Recent Labs  Lab 08/03/19 0250  INR 1.3*    Sepsis Markers Recent Labs  Lab 08/02/19 1130 08/03/19 0250 08/03/19 1847  LATICACIDVEN 1.4  --  0.8  PROCALCITON  --  5.99  --     Cardiac Enzymes No results for input(s): TROPONINI, PROBNP in the last 168 hours.     [facility] 08/06/2019, 11:58 AM

## 2019-08-06 NOTE — Progress Notes (Signed)
eLink Physician-Brief Progress Note Patient Name: Leslie Molina DOB: 25-Jan-1986 MRN: [id number]   Date of Service  08/06/2019  HPI/Events of Note  Fever to 102.9 - No improvement with Ibuprofen and Tylenol. HR = 120's Last LVEF = 65-70%  eICU Interventions  Will order: 1. Cooling blanket PRN. 2. Bolus with 0.9 NaCl 1 liter IV over 1 hour now.      Intervention Category Major Interventions: Other:  Lysle Dingwall 08/06/2019, 11:28 PM

## 2019-08-06 NOTE — Plan of Care (Signed)
Plan of care reviewed with pt at bedside. Pt anxious. Up to [facility] with assist. Pain 10/10 medications given per orders.  Pt went for CT of neck, results relayed to MD. HR elevated to 120-150s upon return to unit. Pain medication and anxiety medication given.  Pt stable at this time, will continue to monitor.  Problem: Health Behavior/Discharge Planning: Goal: Ability to manage health-related needs will improve Outcome: Progressing   Problem: Clinical Measurements: Goal: Ability to maintain clinical measurements within normal limits will improve Outcome: Progressing Goal: Will remain free from infection Outcome: Progressing Goal: Diagnostic test results will improve Outcome: Progressing Goal: Respiratory complications will improve Outcome: Progressing Goal: Cardiovascular complication will be avoided Outcome: Progressing   Problem: Activity: Goal: Risk for activity intolerance will decrease Outcome: Progressing   Problem: Nutrition: Goal: Adequate nutrition will be maintained Outcome: Progressing   Problem: Coping: Goal: Level of anxiety will decrease Outcome: Progressing   Problem: Elimination: Goal: Will not experience complications related to bowel motility Outcome: Progressing Goal: Will not experience complications related to urinary retention Outcome: Progressing   Problem: Pain Managment: Goal: General experience of comfort will improve Outcome: Progressing   Problem: Safety: Goal: Ability to remain free from injury will improve Outcome: Progressing   Problem: Skin Integrity: Goal: Risk for impaired skin integrity will decrease Outcome: Progressing

## 2019-08-06 NOTE — Progress Notes (Addendum)
ID PROGRESS NOTE  34yo F with disseminated MRSA infection with bacteremia, osteomyelitis of SI region and sacrum, right ilioposaas muslce and myositis to gluteal muscle groups.  Has had persistent fever x 5 days up to 102F with associated tachycardia despite IV vancomycin, otherwise clinicially stable. She states that her right hand swelling improved but localized over her 3rd PIP. Still sore on right supraclavicular.\  BP (!) 93/57 (BP Location: Right Arm)   Pulse (!) 115   Temp 98.8 F (37.1 C) (Oral)   Resp 18   Ht 5\' 1"  (1.549 m)   Wt 73.6 kg   LMP  (LMP Unknown)   SpO2 95%   BMI 30.67 kg/m  Physical Exam  Constitutional:  oriented to person, place, and time. appears well-developed and well-nourished. No distress.  HENT: [place]/AT, PERRLA, no scleral icterus Mouth/Throat: Oropharynx is clear and moist. No oropharyngeal exudate.  Cardiovascular: Normal rate, regular rhythm and normal heart sounds. Exam reveals no gallop and no friction rub.  No murmur heard.  Pulmonary/Chest: Effort normal and breath sounds normal. No respiratory distress.  has no wheezes.  Neck = supple, no nuchal rigidity, tenderness to right supraclavicular area. Left ij in place, new dressing Abdominal: Soft. Bowel sounds are normal.  exhibits no distension. There is no tenderness.  Lymphadenopathy: no cervical adenopathy. No axillary adenopathy Neurological: alert and oriented to person, place, and time.  Skin: Skin is warm and dry. Right hand localized tender nodule dorsum of hand Psychiatric: a normal mood and affect.  behavior is normal.   Plan: disseminated MRSA infection - continue with IV vancomycin - if repeat blood cx are no growth to date, then can place new picc line tomorrow and discontinue left ij  Would recommend to get ortho to evaluate right hand to see if needs I X D.  Will order neck CT with contrast to see if there is any endovascular infection/clot or abscess in supraclavicular  region  Continue to monitor, if still febrile over next day, consider to repeat imaging to see if any fluid collection has organizeds which would required I x D.    Elzie Rings [facility] [phone]

## 2019-08-06 NOTE — Progress Notes (Signed)
Pharmacy Antibiotic Note  Leslie Molina is a 34 y.o. female admitted with MRSA bacteremia/abscess w/ osteo ofof iliac bone and sacrum.  Pharmacy dosing vancomycin.   -vancomycin peak= 19, trough= 7 -SCr= 0.5 Auc 196  Will increase to 2g q 8 hours and recheck levels.   Hesitant to go to 2g q6 or 2.5q8 for higher auc. Recheck levels at steady state on new regimen   Height: 5\' 1"  (154.9 cm) Weight: 162 lb 4.8 oz (73.6 kg) IBW/kg (Calculated) : 47.8  Temp (24hrs), Avg:100.8 F (38.2 C), Min:98.8 F (37.1 C), Max:102.3 F (39.1 C)  Recent Labs  Lab 08/02/19 0843 08/02/19 0843 08/02/19 1130 08/03/19 0250 08/03/19 1847 08/04/19 0500 08/05/19 0100 08/05/19 0529 08/05/19 0530 08/06/19 0541 08/06/19 1750 08/06/19 2100  WBC 14.0*  --   --   --  12.1* 13.0*  --   --  11.0* 11.0*  --   --   CREATININE 0.60   < >  --  0.50 0.55 0.54  --   --  0.58 0.54  --   --   LATICACIDVEN  --   --  1.4  --  0.8  --   --   --   --   --   --   --   VANCOTROUGH  --   --   --   --   --   --   --  4*  --   --   --  7*  VANCOPEAK  --   --   --   --   --   --  14*  --   --   --  19*  --    < > = values in this interval not displayed.    Estimated Creatinine Clearance: 90.9 mL/min (by C-G formula based on SCr of 0.54 mg/dL).    No Known Allergies  Antimicrobials this admission: 3/3 metronidazole x 1 3/3 vancomycin >>  3/3 cefepime >>3/5 3/4 daptomycin 500mg  x 1   Dose adjustments this admission:  Microbiology results: 3/3 BCx: GPC clusters 4/4 bottles  (BCID = MRSA) 3/3 iliopsoas abscess: MRSA  Thank you for allowing pharmacy to be a part of this patient's care.  Erin Hearing PharmD., BCPS Clinical Pharmacist 08/06/2019 10:34 PM

## 2019-08-07 ENCOUNTER — Inpatient Hospital Stay: Payer: Self-pay

## 2019-08-07 DIAGNOSIS — M79641 Pain in right hand: Secondary | ICD-10-CM

## 2019-08-07 DIAGNOSIS — M898X1 Other specified disorders of bone, shoulder: Secondary | ICD-10-CM

## 2019-08-07 LAB — CBC
HCT: 31.6 % — ABNORMAL LOW (ref 36.0–46.0)
Hemoglobin: 10.1 g/dL — ABNORMAL LOW (ref 12.0–15.0)
MCH: 27.8 pg (ref 26.0–34.0)
MCHC: 32 g/dL (ref 30.0–36.0)
MCV: 87.1 fL (ref 80.0–100.0)
Platelets: 425 10*3/uL — ABNORMAL HIGH (ref 150–400)
RBC: 3.63 MIL/uL — ABNORMAL LOW (ref 3.87–5.11)
RDW: 15.4 % (ref 11.5–15.5)
WBC: 13.3 10*3/uL — ABNORMAL HIGH (ref 4.0–10.5)
nRBC: 0 % (ref 0.0–0.2)

## 2019-08-07 LAB — COMPREHENSIVE METABOLIC PANEL
ALT: 42 U/L (ref 0–44)
AST: 29 U/L (ref 15–41)
Albumin: 1.7 g/dL — ABNORMAL LOW (ref 3.5–5.0)
Alkaline Phosphatase: 106 U/L (ref 38–126)
Anion gap: 8 (ref 5–15)
BUN: <5 mg/dL — ABNORMAL LOW (ref 6–20)
CO2: 25 mmol/L (ref 22–32)
Calcium: 7.8 mg/dL — ABNORMAL LOW (ref 8.9–10.3)
Chloride: 106 mmol/L (ref 98–111)
Creatinine, Ser: 0.36 mg/dL — ABNORMAL LOW (ref 0.44–1.00)
GFR calc Af Amer: >60 mL/min (ref >60)
GFR calc non Af Amer: >60 mL/min (ref >60)
Glucose, Bld: 112 mg/dL — ABNORMAL HIGH (ref 70–99)
Potassium: 4.6 mmol/L (ref 3.5–5.1)
Sodium: 139 mmol/L (ref 135–145)
Total Bilirubin: 0.7 mg/dL (ref 0.3–1.2)
Total Protein: 5.4 g/dL — ABNORMAL LOW (ref 6.5–8.1)

## 2019-08-07 LAB — AEROBIC/ANAEROBIC CULTURE W GRAM STAIN (SURGICAL/DEEP WOUND)

## 2019-08-07 LAB — MAGNESIUM: Magnesium: 2.1 mg/dL (ref 1.7–2.4)

## 2019-08-07 MED ORDER — VANCOMYCIN HCL 1500 MG/300ML IV SOLN
1500.0000 mg | Freq: Three times a day (TID) | INTRAVENOUS | Status: DC
  Administered 2019-08-07 – 2019-08-10 (×9): 1500 mg via INTRAVENOUS
  Filled 2019-08-07 (×11): qty 300

## 2019-08-07 NOTE — Progress Notes (Signed)
Spoke with primary RN and informed her that PICC line would not be placed today but we will place it tomorrow. Patient has IV access.

## 2019-08-07 NOTE — Progress Notes (Signed)
Per Dr. Baxter Flattery - ok to place PICC now that blood cultures are negative x2. Ok to DC central line since peripheral access has been obtained.

## 2019-08-07 NOTE — Progress Notes (Signed)
Pharmacy Antibiotic Note  Leslie Molina is a 34 y.o. female admitted with MRSA bacteremia/abscess w/ osteo ofof iliac bone and sacrum.  Pharmacy dosing vancomycin.   -vancomycin peak= 19, trough= 7 -SCr= 0.5 AUC 295  Will increase to 1500mg  q 8 hours with estimated AUC 436 and recheck levels at steady state.     Height: 5\' 1"  (154.9 cm) Weight: 162 lb (73.5 kg) IBW/kg (Calculated) : 47.8  Temp (24hrs), Avg:100.1 F (37.8 C), Min:97.5 F (36.4 C), Max:102.9 F (39.4 C)  Recent Labs  Lab 08/02/19 1130 08/03/19 0250 08/03/19 1847 08/04/19 0500 08/05/19 0100 08/05/19 0529 08/05/19 0530 08/06/19 0541 08/06/19 1750 08/06/19 2100 08/07/19 0615  WBC  --   --  12.1* 13.0*  --   --  11.0* 11.0*  --   --  13.3*  CREATININE  --    < > 0.55 0.54  --   --  0.58 0.54  --   --  0.36*  LATICACIDVEN 1.4  --  0.8  --   --   --   --   --   --   --   --   VANCOTROUGH  --   --   --   --   --  4*  --   --   --  7*  --   VANCOPEAK  --   --   --   --  14*  --   --   --  19*  --   --    < > = values in this interval not displayed.    Estimated Creatinine Clearance: 90.9 mL/min (A) (by C-G formula based on SCr of 0.36 mg/dL (L)).    No Known Allergies  Antimicrobials this admission: 3/3 metronidazole x 1 3/3 vancomycin >>  3/3 cefepime >>3/5 3/4 daptomycin 500mg  x 1   Dose adjustments this admission:  Microbiology results: 3/3 BCx: GPC clusters 4/4 bottles  (BCID = MRSA) 3/3 iliopsoas abscess: MRSA   Bonnita Nasuti Pharm.D. CPP, BCPS Clinical Pharmacist [phone] 08/07/2019 1:47 PM

## 2019-08-07 NOTE — Progress Notes (Signed)
   08/07/19 1247  MEWS Assessment  Is this an acute change? No  Provider Notification  Provider Name/Title Jayse, Hammond  Date Provider Notified 08/07/19  Time Provider Notified 1247  Notification Type Call  Date of Provider Response 08/07/19  Time of Provider Response 1249   Notified MD for temp 102.7, prn meds given and cooling blanket placed. I will continue to monitor the patient closely.   Saddie Benders RN

## 2019-08-07 NOTE — Progress Notes (Signed)
PCCM:  Case with Dr. Ree Kida.  Images of the neck reviewed.  Appears to be loculated infection within the right apex of the pleural space, adjacent septic emboli, parenchymal abscess as well as infection extending into the chest wall and retroclavicular space.  Recommended consultation to cardiothoracic surgery.  [facility] 08/07/2019 11:17 AM

## 2019-08-07 NOTE — Social Work (Signed)
CSW faxed verification that patient is hospitalized to [facility] fax [phone].   Criss Alvine, CSW

## 2019-08-07 NOTE — Progress Notes (Addendum)
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  Continued fevers likely related to poor source control.  CCM and cardiovascular thoracic surgery has been consulted for possible interventions.  Right hand is improved although remaining swollen over the third PIP.  Blood cultures from 08/05/2019 remain without growth to date.  Renal function remains without evidence of nephrotoxicity with vancomycin.  Continue current dose of vancomycin.  She does continue to have pain rated 10 out of 10 in her right hip although appears to be resting comfortably during interview.   PLAN:  1. Continue current dose of vancomycin. 2. Therapeutic drug monitoring of renal function for nephrotoxicity. 3. Monitor repeat blood cultures for clearance of bacteremia 4. Appreciate cardiovascular thoracic surgery/CCM evaluation for possible surgical interventions for empyema. 5. 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 Chlorhexidine Gluconate Cloth  6 each Topical Daily  . docusate sodium  100 mg Oral BID  . enoxaparin (LOVENOX) injection  40 mg Subcutaneous Q24H  . polyethylene glycol  17 g Oral Daily  . sodium chloride flush  10-40 mL Intracatheter Q12H  . traZODone  150 mg Oral QHS    SUBJECTIVE:  Febrile overnight with max temperature of 102.9. Nursing reports some disorientation with this. CT csoft tissue with multiple peripheral nodules with the imaged lung apices suspicious for septic emboli and right pleural collection containing a punctuate focus of gas along the lateral aspect of the right lung apex  likely reflecting empyema.  Continues to have right hip pain. No shortness of breath at present.   No Known Allergies   Review of Systems: Review of Systems  Constitutional: Negative for chills, fever and weight loss.  Respiratory: Negative for cough, shortness of breath and wheezing.   Cardiovascular: Negative for chest pain and leg swelling.  Gastrointestinal: Negative for abdominal pain, constipation, diarrhea, nausea and vomiting.  Musculoskeletal:       Positive for right hip pain  Skin: Negative for rash.    OBJECTIVE: Vitals:   08/07/19 0150 08/07/19 0500 08/07/19 0809 08/07/19 1230  BP: (!) 91/55 92/66    Pulse: 94 80  (!) 119  Resp: 18 16    Temp: 98.4 F (36.9 C) (!) 97.5 F (36.4 C) 98.4 F (36.9 C) (!) 102.7 F (39.3 C)  TempSrc: Oral Oral Oral Oral  SpO2: 92% 99%  100%  Weight:  73.5 kg    Height:       Body mass index is 30.61 kg/m.  Physical Exam Constitutional:      General: She is not in acute distress.    Appearance: She is well-developed.     Comments: Lying in bed with head of bed elevated; pleasant  Cardiovascular:     Rate and Rhythm: Normal rate and regular rhythm.     Heart sounds: Normal heart sounds.  Pulmonary:     Effort: Pulmonary effort is normal.     Breath sounds: Examination of the right-upper field reveals decreased breath sounds. Examination of the right-middle field reveals  decreased breath sounds. Examination of the right-lower field reveals decreased breath sounds. Decreased breath sounds present.  Skin:    General: Skin is warm and dry.  Neurological:     Mental Status: She is alert and oriented to person, place, and time.  Psychiatric:        Behavior: Behavior normal.        Thought Content: Thought content normal.        Judgment: Judgment normal.     Lab Results Lab Results  Component Value Date   WBC 13.3 (H) 08/07/2019   HGB 10.1 (L) 08/07/2019   HCT 31.6 (L) 08/07/2019   MCV 87.1 08/07/2019   PLT 425 (H)  08/07/2019    Lab Results  Component Value Date   CREATININE 0.36 (L) 08/07/2019   BUN <5 (L) 08/07/2019   NA 139 08/07/2019   K 4.6 08/07/2019   CL 106 08/07/2019   CO2 25 08/07/2019    Lab Results  Component Value Date   ALT 42 08/07/2019   AST 29 08/07/2019   ALKPHOS 106 08/07/2019   BILITOT 0.7 08/07/2019     Microbiology: Recent Results (from the past 240 hour(s))  SARS CORONAVIRUS 2 (TAT 6-24 HRS) Nasopharyngeal Nasopharyngeal Swab     Status: None   Collection Time: 07/29/19 10:44 AM   Specimen: Nasopharyngeal Swab  Result Value Ref Range Status   SARS Coronavirus 2 NEGATIVE NEGATIVE Final    Comment: (NOTE) SARS-CoV-2 target nucleic acids are NOT DETECTED. The SARS-CoV-2 RNA is generally detectable in upper and lower respiratory specimens during the acute phase of infection. Negative results do not preclude SARS-CoV-2 infection, do not rule out co-infections with other pathogens, and should not be used as the sole basis for treatment or other patient management decisions. Negative results must be combined with clinical observations, patient history, and epidemiological information. The expected result is Negative. Fact Sheet for Patients: SugarRoll.be Fact Sheet for Healthcare Providers: https://www.Kratzke-mathews.com/ This test is not yet approved or cleared by the [place] FDA and  has been authorized for detection and/or diagnosis of SARS-CoV-2 by FDA under an Emergency Use Authorization (EUA). This EUA will remain  in effect (meaning this test can be used) for the duration of the COVID-19 declaration under Section 56 4(b)(1) of the Act, 21 U.S.C. section 360bbb-3(b)(1), unless the authorization is terminated or revoked sooner. Performed at [facility], [place] [address], [place], [place] [address]   Respiratory Panel by RT PCR (Flu A&B, Covid) - Nasopharyngeal Swab     Status: None   Collection Time:  08/02/19 11:20 AM   Specimen: Nasopharyngeal Swab  Result Value Ref Range Status   SARS Coronavirus 2 by RT PCR NEGATIVE NEGATIVE Final    Comment: (NOTE) SARS-CoV-2 target nucleic acids are NOT DETECTED. The SARS-CoV-2 RNA is generally detectable in upper respiratoy specimens during the acute phase of infection. The lowest concentration of SARS-CoV-2 viral copies this assay can detect is 131 copies/mL. A negative result does not preclude SARS-Cov-2 infection and should not be used as the sole basis for treatment or other patient management decisions. A negative result may occur with  improper specimen collection/handling, submission of specimen other than nasopharyngeal swab, presence of viral mutation(s) within the areas targeted by this assay, and inadequate number of viral copies (<131 copies/mL). A negative result must be combined with clinical observations, patient history, and epidemiological information. The expected result is Negative. Fact Sheet for Patients:  PinkCheek.be Fact Sheet for Healthcare Providers:  GravelBags.it This test is not yet ap proved or cleared by the [place] and  has been authorized for detection and/or diagnosis of SARS-CoV-2 by FDA under an Emergency Use Authorization (EUA). This EUA will remain  in effect (meaning this test can be used) for the duration of the COVID-19 declaration under Section 564(b)(1) of the Act, 21 U.S.C. section 360bbb-3(b)(1), unless the authorization is terminated or revoked sooner.    Influenza A by PCR NEGATIVE NEGATIVE Final   Influenza B by PCR NEGATIVE NEGATIVE Final    Comment: (NOTE) The Xpert Xpress SARS-CoV-2/FLU/RSV assay is intended as an aid in  the diagnosis of influenza from Nasopharyngeal swab specimens and  should not be used as a sole basis for treatment. Nasal washings and  aspirates are unacceptable for Xpert Xpress SARS-CoV-2/FLU/RSV    testing. Fact Sheet for Patients: PinkCheek.be Fact Sheet for Healthcare Providers: GravelBags.it This test is not yet approved or cleared by the [place] FDA and  has been authorized for detection and/or diagnosis of SARS-CoV-2 by  FDA under an Emergency Use Authorization (EUA). This EUA will remain  in effect (meaning this test can be used) for the duration of the  Covid-19 declaration under Section 564(b)(1) of the Act, 21  U.S.C. section 360bbb-3(b)(1), unless the authorization is  terminated or revoked. Performed at [facility], [place] [place], [place] [address]   Blood culture (routine x 2)     Status: Abnormal   Collection Time: 08/02/19 11:20 AM   Specimen: BLOOD  Result Value Ref Range Status   Specimen Description   Final    BLOOD RIGHT ANTECUBITAL Performed at [facility], [address] [place], [place] [address]    Special Requests   Final    BOTTLES DRAWN AEROBIC AND ANAEROBIC Blood Culture adequate volume Performed at [facility], [address] [place], [place] [address]    Culture  Setup Time   Final    IN BOTH AEROBIC AND ANAEROBIC BOTTLES GRAM POSITIVE COCCI CRITICAL RESULT CALLED TO, READ BACK BY AND VERIFIED WITH: [place] @2022  08/02/19 [place] Performed at [facility], [place] [address] [place], [place] [address]    Culture METHICILLIN RESISTANT STAPHYLOCOCCUS AUREUS (A)  Final   Report Status 08/05/2019 FINAL  Final   Organism ID, Bacteria METHICILLIN RESISTANT STAPHYLOCOCCUS AUREUS  Final      Susceptibility   Methicillin resistant staphylococcus aureus - MIC*    CIPROFLOXACIN >=8 RESISTANT Resistant     ERYTHROMYCIN >=8 RESISTANT Resistant     GENTAMICIN <=0.5 SENSITIVE Sensitive     OXACILLIN >=4 RESISTANT Resistant     TETRACYCLINE <=1 SENSITIVE Sensitive     VANCOMYCIN <=0.5 SENSITIVE Sensitive     TRIMETH/SULFA <=10 SENSITIVE  Sensitive     CLINDAMYCIN <=0.25 SENSITIVE Sensitive     RIFAMPIN <=0.5 SENSITIVE Sensitive     Inducible Clindamycin NEGATIVE Sensitive     * METHICILLIN RESISTANT STAPHYLOCOCCUS AUREUS  Blood culture (routine x 2)     Status: Abnormal   Collection Time: 08/02/19 11:20 AM   Specimen: BLOOD  Result Value Ref Range Status   Specimen Description   Final    BLOOD BLOOD RIGHT ARM Performed at [facility], [address] [place], [place] [address]    Special Requests   Final    BOTTLES DRAWN AEROBIC AND ANAEROBIC Blood Culture adequate volume Performed at [facility], [address], [place], [place] [address]    Culture  Setup Time   Final    IN BOTH AEROBIC AND ANAEROBIC BOTTLES GRAM POSITIVE COCCI CRITICAL RESULT CALLED TO, READ BACK BY AND VERIFIED WITH: [place] @0019  08/03/19 [place]    Culture (A)  Final    STAPHYLOCOCCUS AUREUS SUSCEPTIBILITIES PERFORMED ON PREVIOUS CULTURE WITHIN THE LAST 5 DAYS. Performed at [facility], [place] [address] [place], [place] [address]    Report Status 08/05/2019 FINAL  Final  Blood Culture ID Panel (Reflexed)     Status: Abnormal   Collection Time: 08/02/19 11:20 AM  Result Value Ref Range Status   Enterococcus species NOT DETECTED NOT DETECTED Final   Listeria monocytogenes NOT DETECTED NOT DETECTED Final   Staphylococcus species DETECTED (A) NOT DETECTED Final    Comment: CRITICAL RESULT CALLED TO, READ BACK BY AND VERIFIED WITH: SC[place] @0019  08/03/19 AKT    Staphylococcus aureus (BCID) DETECTED (A) NOT DETECTED Final    Comment: Methicillin (oxacillin)-resistant Staphylococcus aureus (MRSA). MRSA is predictably resistant to beta-lactam antibiotics (except ceftaroline). Preferred therapy is vancomycin unless clinically contraindicated. Patient requires contact precautions if  hospitalized. CRITICAL RESULT CALLED TO, READ BACK BY AND VERIFIED WITH: SCOTT HALL @0019  08/03/19 AKT    Methicillin resistance DETECTED  (A) NOT DETECTED Final    Comment: CRITICAL RESULT CALLED TO, READ BACK BY AND VERIFIED WITH: SC[place] H[place] @0019  08/03/19 AKT    Streptococcus species NOT DETECTED NOT DETECTED Final   Streptococcus agalactiae NOT DETECTED NOT DETECTED Final   Streptococcus pneumoniae NOT DETECTED NOT DETECTED Final   Streptococcus pyogenes NOT DETECTED NOT DETECTED Final   Acinetobacter baumannii NOT DETECTED NOT DETECTED Final   Enterobacteriaceae species NOT DETECTED NOT DETECTED Final   Enterobacter cloacae complex NOT DETECTED NOT DETECTED Final   Escherichia coli NOT DETECTED NOT DETECTED Final   Klebsiella oxytoca NOT DETECTED NOT DETECTED Final   Klebsiella pneumoniae NOT DETECTED NOT DETECTED Final   Proteus species NOT DETECTED NOT DETECTED Final   Serratia marcescens NOT DETECTED NOT DETECTED Final   Haemophilus influenzae NOT DETECTED NOT DETECTED Final   Neisseria meningitidis NOT DETECTED NOT DETECTED Final   Pseudomonas aeruginosa NOT DETECTED NOT DETECTED Final   Candida albicans NOT DETECTED NOT DETECTED Final   Candida glabrata NOT DETECTED NOT DETECTED Final   Candida krusei NOT DETECTED NOT DETECTED Final   Candida parapsilosis NOT DETECTED NOT DETECTED Final   Candida tropicalis NOT DETECTED NOT DETECTED Final    Comment: Performed at [facility], [address] [place], [place] [address]  Aerobic/Anaerobic Culture (surgical/deep wound)     Status: None   Collection Time: 08/02/19  5:29 PM   Specimen: Abscess  Result Value Ref Range Status   Specimen Description   Final    ABSCESS Performed at [facility], [address] [place], [place] [address]    Special Requests ILIACUS MUSCLE  Final   Gram Stain   Final    MODERATE WBC PRESENT,BOTH PMN AND MONONUCLEAR ABUNDANT GRAM POSITIVE COCCI IN PAIRS IN CLUSTERS    Culture   Final    ABUNDANT METHICILLIN RESISTANT STAPHYLOCOCCUS AUREUS NO ANAEROBES ISOLATED Performed at [facility], [place] [address], [place], [place] [address]    Report Status 08/07/2019 FINAL  Final   Organism ID, Bacteria METHICILLIN RESISTANT STAPHYLOCOCCUS AUREUS  Final      Susceptibility   Methicillin resistant staphylococcus aureus - MIC*    CIPROFLOXACIN >=8 RESISTANT Resistant     ERYTHROMYCIN >=8 RESISTANT Resistant  GENTAMICIN <=0.5 SENSITIVE Sensitive     OXACILLIN >=4 RESISTANT Resistant     TETRACYCLINE <=1 SENSITIVE Sensitive     VANCOMYCIN 1 SENSITIVE Sensitive     TRIMETH/SULFA <=10 SENSITIVE Sensitive     CLINDAMYCIN <=0.25 SENSITIVE Sensitive     RIFAMPIN <=0.5 SENSITIVE Sensitive     Inducible Clindamycin NEGATIVE Sensitive     * ABUNDANT METHICILLIN RESISTANT STAPHYLOCOCCUS AUREUS  MRSA PCR Screening     Status: Abnormal   Collection Time: 08/02/19  8:24 PM   Specimen: Nasal Mucosa; Nasopharyngeal  Result Value Ref Range Status   MRSA by PCR POSITIVE (A) NEGATIVE Final    Comment:        The GeneXpert MRSA Assay (FDA approved for NASAL specimens only), is one component of a comprehensive MRSA colonization surveillance program. It is not intended to diagnose MRSA infection nor to guide or monitor treatment for MRSA infections. RESULT CALLED TO, READ BACK BY AND VERIFIED WITH: Gypsy Lore 08/02/19 @ 2143  [place] Performed at [facility], [place] [place], [place] [address]   CULTURE, BLOOD (ROUTINE X 2) w Reflex to ID Panel     Status: Abnormal   Collection Time: 08/03/19 10:17 AM   Specimen: BLOOD  Result Value Ref Range Status   Specimen Description   Final    BLOOD LEFT ANTECUBITAL Performed at [facility], [address] [place], [place] [address]    Special Requests   Final    BOTTLES DRAWN AEROBIC AND ANAEROBIC Blood Culture adequate volume Performed at [facility], [address] [place], [place] [address]    Culture  Setup Time   Final    GRAM POSITIVE COCCI IN BOTH AEROBIC AND ANAEROBIC BOTTLES CRITICAL VALUE NOTED.  VALUE IS  CONSISTENT WITH PREVIOUSLY REPORTED AND CALLED VALUE. Performed at [facility], [address] [place], [place] [address]    Culture (A)  Final    STAPHYLOCOCCUS AUREUS SUSCEPTIBILITIES PERFORMED ON PREVIOUS CULTURE WITHIN THE LAST 5 DAYS. Performed at [facility], [place] [address] [place], [place] [address]    Report Status 08/06/2019 FINAL  Final  CULTURE, BLOOD (ROUTINE X 2) w Reflex to ID Panel     Status: Abnormal   Collection Time: 08/03/19 10:32 AM   Specimen: BLOOD  Result Value Ref Range Status   Specimen Description   Final    BLOOD BLOOD LEFT HAND Performed at [facility], [address], [place], [place] [address]    Special Requests   Final    BOTTLES DRAWN AEROBIC AND ANAEROBIC Blood Culture adequate volume Performed at [facility], [address] [place], [place] [address]    Culture  Setup Time   Final    GRAM POSITIVE COCCI IN BOTH AEROBIC AND ANAEROBIC BOTTLES CRITICAL RESULT CALLED TO, READ BACK BY AND VERIFIED WITH: [place] [id number] 08/04/19 [place] Performed at [facility], [address] [place], [place] [address]    Culture (A)  Final    STAPHYLOCOCCUS AUREUS SUSCEPTIBILITIES PERFORMED ON PREVIOUS CULTURE WITHIN THE LAST 5 DAYS. Performed at [facility], [place] [address], [place],  [address]    Report Status 08/06/2019 FINAL  Final  Culture, blood (Routine X 2) w Reflex to ID Panel     Status: None (Preliminary result)   Collection Time: 08/05/19  7:18 AM   Specimen: BLOOD  Result Value Ref Range Status   Specimen Description BLOOD LEFT ANTECUBITAL  Final   Special Requests   Final    BOTTLES DRAWN AEROBIC AND ANAEROBIC Blood Culture adequate volume   Culture   Final    NO GROWTH 2 DAYS Performed at [facility], [address], [place], [place] [address]    Report Status PENDING  Incomplete  Culture, blood (Routine X 2) w Reflex to ID Panel     Status: None (Preliminary result)     Collection Time: 08/05/19  7:19 AM   Specimen: BLOOD LEFT HAND  Result Value Ref Range Status   Specimen Description BLOOD LEFT HAND  Final   Special Requests   Final    BOTTLES DRAWN AEROBIC AND ANAEROBIC Blood Culture adequate volume   Culture   Final    NO GROWTH 2 DAYS Performed at [facility], [place] [address], [place], [place] [address]    Report Status PENDING  Incomplete     Terri Piedra, [place] [facility] [phone] Pager  08/07/2019  1:00 PM

## 2019-08-07 NOTE — Progress Notes (Signed)
PROGRESS NOTE    Leslie Molina  [id number] DOB: [birth year] DOA: 08/03/2019 PCP: [facility] [place]   Brief Narrative:  HPI on 08/02/2019 by Dr. Royce Macadamia Agbata Leslie Molina is a 34 y.o. female with medical history significant for bipolar disorder, anxiety, history of IV drug use presents to the emergency room twice within 1 week and lower back pain as well as worsening pain in her right lower extremity.  Was seen in the emergency room about a week ago and was discharged on gabapentin.  She was febrile during that visit had a Covid test done and was asked to quarantine until the results became available.  She returns to the emergency room today febrile with a T-max of 102F, she is also tachycardic and acutely ill-appearing.  She complains of worsening pain in her right extremity which she rated 10 out of 10 in intensity at its worse.  She denied having any trauma.  Denied having any sick contacts.  She denied having any cough, shortness of breath, nausea, vomiting, diaphoresis or palpitations.  She denied having any abdominal pain, no frequency of urination, dysuria or nocturia. She has not been able to bear weight on her right leg. She had an MRI of the right hip which showed extensive right iliopsoas abscess.  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  Myositis of the gluteus medius, maximus and minimus.  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  The abscess extends through the posterior aspect of the proximal right thigh  Interim history Admitted with sepsis secondary to mrsa bacteremia. ID consulted.  She also found to have empyema CT soft cardiothoracic surgery consulted. Assessment & Plan   Sepsis secondary to MRSA bacteremia, possible source empyema -Noted on admission, as patient did have leukocytosis, tachycardia, tachypnea -Noted to have seeding multiple areas causing osteomyelitis, iliopsoas and gluteal abscess -Patient continues to have fever as  well as tachycardia -Infectious disease consulted and appreciated -Continue vancomycin -Blood cultures from 08/03/2019 showed Staph aureus -Blood culture from 08/05/2019 showed no growth to date -CT soft tissue neck on 08/06/2019 showed multiple peripheral predominant nodules within the lung apices, suspicious for septic emboli.  Right pleural collection containing a punctate focus of gas along the lateral aspect of the right lung apex likely reflecting empyema.  Extension of edema into the right pectoralis musculature.  Inflammatory stranding within the right retroclavicular region and right lower neck consistent with soft tissue infection. -Discussed CT finding with cardiothoracic surgery, consulted and appreciated.  Iliopsoas/gluteal abscess/osteomyelitis of sacrum and ischium/possible infection of right hand dorsum and right sternoclavicular joint -Noted on MRI of right hip and lumbar spine -Infectious disease and orthopedics consulted and appreciated -Pending MRI of the right hand  Peritonitis/IUD perforation -CT scan -Gynecology consulted and appreciated as well as general surgery (see General surgery signed off) -Telemetry pain control -No surgical intervention at this time  Hypokalemia Resolved with replacement, continue to monitor BMP  Bipolar disease/anxiety and depression -Home medications Cymbalta, Seroquel, trazodone held  DVT Prophylaxis  lovenox  Code Status: Full  Family Communication: None at bedside.  Disposition Plan: Admitted from home. Here with MRSA bacteremia. Continues to have fever. ID consulted. Currently on IV vancomycin. Cardiothoracic surgery consulted. Dispo TBD.  Consultants PCCM Gynecology Orthopedic surgery Infectious disease General surgery Cardiothoracic surgery  Procedures  None  Antibiotics   Anti-infectives (From admission, onward)   Start     Dose/Rate Route Frequency Ordered Stop   08/06/19 2300  vancomycin (VANCOREADY) IVPB  2000 mg/400  mL     2,000 mg 200 mL/hr over 120 Minutes Intravenous Every 8 hours 08/06/19 2230     08/05/19 1400  vancomycin (VANCOCIN) IVPB 1000 mg/200 mL premix  Status:  Discontinued     1,000 mg 200 mL/hr over 60 Minutes Intravenous Every 8 hours 08/05/19 0815 08/06/19 2229   08/03/19 2200  ceFEPIme (MAXIPIME) 2 g in sodium chloride 0.9 % 100 mL IVPB  Status:  Discontinued     2 g 200 mL/hr over 30 Minutes Intravenous Every 8 hours 08/03/19 1851 08/04/19 1027   08/03/19 2100  vancomycin (VANCOREADY) IVPB 750 mg/150 mL  Status:  Discontinued     750 mg 150 mL/hr over 60 Minutes Intravenous Every 8 hours 08/03/19 1851 08/05/19 0815   08/03/19 2000  clindamycin (CLEOCIN) IVPB 900 mg  Status:  Discontinued     900 mg 100 mL/hr over 30 Minutes Intravenous Every 8 hours 08/03/19 1851 08/04/19 1027      Subjective:   Leslie Molina seen and examined today.  Patient complains of pain particularly in the right hip and back.  Denies current chest pain or shortness of breath, abdominal pain, nausea or vomiting, diarrhea or constipation.  Complains of continued fever.  Objective:   Vitals:   08/07/19 0150 08/07/19 0500 08/07/19 0809 08/07/19 1230  BP: (!) 91/55 92/66    Pulse: 94 80  (!) 119  Resp: 18 16    Temp: 98.4 F (36.9 C) (!) 97.5 F (36.4 C) 98.4 F (36.9 C) (!) 102.7 F (39.3 C)  TempSrc: Oral Oral Oral Oral  SpO2: 92% 99%  100%  Weight:  73.5 kg    Height:        Intake/Output Summary (Last 24 hours) at 08/07/2019 1251 Last data filed at 08/07/2019 1231 Gross per 24 hour  Intake 1560 ml  Output 3100 ml  Net -1540 ml   Filed Weights   08/05/19 0402 08/06/19 0355 08/07/19 0500  Weight: 80.2 kg 73.6 kg 73.5 kg    Exam  General: Well developed, acutely ill appearing, NAD  HEENT: NCAT, mucous membranes moist.   Cardiovascular: S1 S2 auscultated, RRR, no murmur  Respiratory: diminished breath sounds R otherwise clear  Abdomen: Soft, nontender, nondistended, + bowel  sounds  Extremities: warm dry without cyanosis clubbing or edema  Neuro: AAOx3, nonfocal  Psych: Appropriate mood and affect   Data Reviewed: I have personally reviewed following labs and imaging studies  CBC: Recent Labs  Lab 08/02/19 0843 08/02/19 0843 08/03/19 1847 08/04/19 0500 08/05/19 0530 08/06/19 0541 08/07/19 0615  WBC 14.0*   < > 12.1* 13.0* 11.0* 11.0* 13.3*  NEUTROABS 12.3*  --  10.5*  --   --   --   --   HGB 11.8*   < > 9.7* 9.7* 9.5* 10.4* 10.1*  HCT 33.7*   < > 28.3* 29.3* 28.2* 31.5* 31.6*  MCV 82.0   < > 83.7 84.7 84.4 85.1 87.1  PLT 304   < > 280 303 386 437* 425*   < > = values in this interval not displayed.   Basic Metabolic Panel: Recent Labs  Lab 08/03/19 0250 08/03/19 0250 08/03/19 1847 08/04/19 0500 08/05/19 0530 08/06/19 0541 08/07/19 0615  NA 134*   < > 134* 136 135 135 139  K 2.9*   < > 2.9* 2.8* 3.1* 3.1* 4.6  CL 104   < > 102 102 98 96* 106  CO2 25   < > 22 23  25 28 25   GLUCOSE 130*   < > 121* 111* 149* 141* 112*  BUN 15   < > 6 5* <5* <5* <5*  CREATININE 0.50   < > 0.55 0.54 0.58 0.54 0.36*  CALCIUM 7.6*   < > 7.6* 7.7* 7.6* 7.7* 7.8*  MG 2.3  --  1.9  --  1.8 1.9 2.1  PHOS  --   --  2.2*  --   --   --   --    < > = values in this interval not displayed.   GFR: Estimated Creatinine Clearance: 90.9 mL/min (A) (by C-G formula based on SCr of 0.36 mg/dL (L)). Liver Function Tests: Recent Labs  Lab 08/02/19 0843 08/03/19 0250 08/05/19 0530 08/06/19 0541 08/07/19 0615  AST 30 24 56* 57* 29  ALT 24 18 36 48* 42  ALKPHOS 103 83 121 127* 106  BILITOT 0.5 0.5 0.5 0.5 0.7  PROT 7.2 6.3* 5.2* 5.6* 5.4*  ALBUMIN 2.9* 2.3* 1.7* 1.8* 1.7*   No results for input(s): LIPASE, AMYLASE in the last 168 hours. No results for input(s): AMMONIA in the last 168 hours. Coagulation Profile: Recent Labs  Lab 08/03/19 0250  INR 1.3*   Cardiac Enzymes: Recent Labs  Lab 08/02/19 0843 08/04/19 0500  CKTOTAL 410* 104   BNP (last 3  results) No results for input(s): PROBNP in the last 8760 hours. HbA1C: No results for input(s): HGBA1C in the last 72 hours. CBG: Recent Labs  Lab 08/02/19 2113  GLUCAP 127*   Lipid Profile: No results for input(s): CHOL, HDL, LDLCALC, TRIG, CHOLHDL, LDLDIRECT in the last 72 hours. Thyroid Function Tests: No results for input(s): TSH, T4TOTAL, FREET4, T3FREE, THYROIDAB in the last 72 hours. Anemia Panel: No results for input(s): VITAMINB12, FOLATE, FERRITIN, TIBC, IRON, RETICCTPCT in the last 72 hours. Urine analysis:    Component Value Date/Time   COLORURINE YELLOW (A) 08/02/2019 0917   APPEARANCEUR CLEAR (A) 08/02/2019 0917   APPEARANCEUR Clear 04/03/2014 0059   LABSPEC 1.008 08/02/2019 0917   LABSPEC 1.016 04/03/2014 0059   PHURINE 6.0 08/02/2019 0917   GLUCOSEU NEGATIVE 08/02/2019 0917   GLUCOSEU Negative 04/03/2014 0059   HGBUR NEGATIVE 08/02/2019 0917   BILIRUBINUR NEGATIVE 08/02/2019 0917   BILIRUBINUR Negative 04/03/2014 0059   KETONESUR NEGATIVE 08/02/2019 0917   PROTEINUR NEGATIVE 08/02/2019 0917   NITRITE NEGATIVE 08/02/2019 0917   LEUKOCYTESUR NEGATIVE 08/02/2019 0917   LEUKOCYTESUR Negative 04/03/2014 0059   Sepsis Labs: @LABRCNTIP (procalcitonin:4,lacticidven:4)  ) Recent Results (from the past 240 hour(s))  SARS CORONAVIRUS 2 (TAT 6-24 HRS) Nasopharyngeal Nasopharyngeal Swab     Status: None   Collection Time: 07/29/19 10:44 AM   Specimen: Nasopharyngeal Swab  Result Value Ref Range Status   SARS Coronavirus 2 NEGATIVE NEGATIVE Final    Comment: (NOTE) SARS-CoV-2 target nucleic acids are NOT DETECTED. The SARS-CoV-2 RNA is generally detectable in upper and lower respiratory specimens during the acute phase of infection. Negative results do not preclude SARS-CoV-2 infection, do not rule out co-infections with other pathogens, and should not be used as the sole basis for treatment or other patient management decisions. Negative results must be  combined with clinical observations, patient history, and epidemiological information. The expected result is Negative. Fact Sheet for Patients: SugarRoll.be Fact Sheet for Healthcare Providers: https://www.[place]-mathews.com/ This test is not yet approved or cleared by the [place] FDA and  has been authorized for detection and/or diagnosis of SARS-CoV-2 by FDA under an Emergency Use Authorization (EUA).  This EUA will remain  in effect (meaning this test can be used) for the duration of the COVID-19 declaration under Section 56 4(b)(1) of the Act, 21 U.S.C. section 360bbb-3(b)(1), unless the authorization is terminated or revoked sooner. Performed at [facility], [place] [address] [place], [place] [address]   Respiratory Panel by RT PCR (Flu A&B, Covid) - Nasopharyngeal Swab     Status: None   Collection Time: 08/02/19 11:20 AM   Specimen: Nasopharyngeal Swab  Result Value Ref Range Status   SARS Coronavirus 2 by RT PCR NEGATIVE NEGATIVE Final    Comment: (NOTE) SARS-CoV-2 target nucleic acids are NOT DETECTED. The SARS-CoV-2 RNA is generally detectable in upper respiratoy specimens during the acute phase of infection. The lowest concentration of SARS-CoV-2 viral copies this assay can detect is 131 copies/mL. A negative result does not preclude SARS-Cov-2 infection and should not be used as the sole basis for treatment or other patient management decisions. A negative result may occur with  improper specimen collection/handling, submission of specimen other than nasopharyngeal swab, presence of viral mutation(s) within the areas targeted by this assay, and inadequate number of viral copies (<131 copies/mL). A negative result must be combined with clinical observations, patient history, and epidemiological information. The expected result is Negative. Fact Sheet for Patients:  PinkCheek.be Fact Sheet  for Healthcare Providers:  GravelBags.it This test is not yet ap proved or cleared by the [place] FDA and  has been authorized for detection and/or diagnosis of SARS-CoV-2 by FDA under an Emergency Use Authorization (EUA). This EUA will remain  in effect (meaning this test can be used) for the duration of the COVID-19 declaration under Section 564(b)(1) of the Act, 21 U.S.C. section 360bbb-3(b)(1), unless the authorization is terminated or revoked sooner.    Influenza A by PCR NEGATIVE NEGATIVE Final   Influenza B by PCR NEGATIVE NEGATIVE Final    Comment: (NOTE) The Xpert Xpress SARS-CoV-2/FLU/RSV assay is intended as an aid in  the diagnosis of influenza from Nasopharyngeal swab specimens and  should not be used as a sole basis for treatment. Nasal washings and  aspirates are unacceptable for Xpert Xpress SARS-CoV-2/FLU/RSV  testing. Fact Sheet for Patients: PinkCheek.be Fact Sheet for Healthcare Providers: GravelBags.it This test is not yet approved or cleared by the [place] FDA and  has been authorized for detection and/or diagnosis of SARS-CoV-2 by  FDA under an Emergency Use Authorization (EUA). This EUA will remain  in effect (meaning this test can be used) for the duration of the  Covid-19 declaration under Section 564(b)(1) of the Act, 21  U.S.C. section 360bbb-3(b)(1), unless the authorization is  terminated or revoked. Performed at [facility], [address] [place], [place] [address]   Blood culture (routine x 2)     Status: Abnormal   Collection Time: 08/02/19 11:20 AM   Specimen: BLOOD  Result Value Ref Range Status   Specimen Description   Final    BLOOD RIGHT ANTECUBITAL Performed at [facility], [address], [place], [place] [address]    Special Requests   Final    BOTTLES DRAWN AEROBIC AND ANAEROBIC Blood Culture adequate  volume Performed at [facility], [address] [place], [place] [address]    Culture  Setup Time   Final    IN BOTH AEROBIC AND ANAEROBIC BOTTLES GRAM POSITIVE COCCI CRITICAL RESULT CALLED TO, READ BACK BY AND VERIFIED WITH: [place] @2022  08/02/19 [place] Performed at [facility], [address] [place], [place] [address]    Culture METHICILLIN RESISTANT STAPHYLOCOCCUS AUREUS (A)  Final   Report Status 08/05/2019 FINAL  Final   Organism ID, Bacteria METHICILLIN RESISTANT STAPHYLOCOCCUS AUREUS  Final      Susceptibility   Methicillin resistant staphylococcus aureus - MIC*    CIPROFLOXACIN >=8 RESISTANT Resistant     ERYTHROMYCIN >=8 RESISTANT Resistant     GENTAMICIN <=0.5 SENSITIVE Sensitive     OXACILLIN >=4 RESISTANT Resistant     TETRACYCLINE <=1 SENSITIVE Sensitive     VANCOMYCIN <=0.5 SENSITIVE Sensitive     TRIMETH/SULFA <=10 SENSITIVE Sensitive     CLINDAMYCIN <=0.25 SENSITIVE Sensitive     RIFAMPIN <=0.5 SENSITIVE Sensitive     Inducible Clindamycin NEGATIVE Sensitive     * METHICILLIN RESISTANT STAPHYLOCOCCUS AUREUS  Blood culture (routine x 2)     Status: Abnormal   Collection Time: 08/02/19 11:20 AM   Specimen: BLOOD  Result Value Ref Range Status   Specimen Description   Final    BLOOD BLOOD RIGHT ARM Performed at [facility], [address] [place], [place] [address]    Special Requests   Final    BOTTLES DRAWN AEROBIC AND ANAEROBIC Blood Culture adequate volume Performed at [facility], [address] [place], [place] [address]    Culture  Setup Time   Final    IN BOTH AEROBIC AND ANAEROBIC BOTTLES GRAM POSITIVE COCCI CRITICAL RESULT CALLED TO, READ BACK BY AND VERIFIED WITH: SCOTT HALL @0019  08/03/19 AKT    Culture (A)  Final    STAPHYLOCOCCUS AUREUS SUSCEPTIBILITIES PERFORMED ON PREVIOUS CULTURE WITHIN THE LAST 5 DAYS. Performed at [facility], [place] [address], [place], [place] [address]    Report Status  08/05/2019 FINAL  Final  Blood Culture ID Panel (Reflexed)     Status: Abnormal   Collection Time: 08/02/19 11:20 AM  Result Value Ref Range Status   Enterococcus species NOT DETECTED NOT DETECTED Final   Listeria monocytogenes NOT DETECTED NOT DETECTED Final   Staphylococcus species DETECTED (A) NOT DETECTED Final    Comment: CRITICAL RESULT CALLED TO, READ BACK BY AND VERIFIED WITH: SCOTT HALL @0019  08/03/19 AKT    Staphylococcus aureus (BCID) DETECTED (A) NOT DETECTED Final    Comment: Methicillin (oxacillin)-resistant Staphylococcus aureus (MRSA). MRSA is predictably resistant to beta-lactam antibiotics (except ceftaroline). Preferred therapy is vancomycin unless clinically contraindicated. Patient requires contact precautions if  hospitalized. CRITICAL RESULT CALLED TO, READ BACK BY AND VERIFIED WITH: SCOTT HALL @0019  08/03/19 AKT    Methicillin resistance DETECTED (A) NOT DETECTED Final    Comment: CRITICAL RESULT CALLED TO, READ BACK BY AND VERIFIED WITH: SCOTT HALL @0019  08/03/19 AKT    Streptococcus species NOT DETECTED NOT DETECTED Final   Streptococcus agalactiae NOT DETECTED NOT DETECTED Final   Streptococcus pneumoniae NOT DETECTED NOT DETECTED Final   Streptococcus pyogenes NOT DETECTED NOT DETECTED Final   Acinetobacter baumannii NOT DETECTED NOT DETECTED Final   Enterobacteriaceae species NOT DETECTED NOT DETECTED Final   Enterobacter cloacae complex NOT DETECTED NOT DETECTED Final   Escherichia coli NOT DETECTED NOT DETECTED Final   Klebsiella oxytoca NOT DETECTED NOT DETECTED Final   Klebsiella pneumoniae NOT DETECTED NOT DETECTED Final   Proteus species NOT DETECTED NOT DETECTED Final   Serratia marcescens NOT DETECTED NOT DETECTED Final   Haemophilus influenzae NOT DETECTED NOT DETECTED Final   Neisseria meningitidis NOT DETECTED NOT DETECTED Final   Pseudomonas aeruginosa NOT  DETECTED NOT DETECTED Final   Candida albicans NOT DETECTED NOT DETECTED Final   Candida  glabrata NOT DETECTED NOT DETECTED Final   Candida krusei NOT DETECTED NOT DETECTED Final   Candida parapsilosis NOT DETECTED NOT DETECTED Final   Candida tropicalis NOT DETECTED NOT DETECTED Final    Comment: Performed at [facility], [address] [place], [place] [address]  Aerobic/Anaerobic Culture (surgical/deep wound)     Status: None   Collection Time: 08/02/19  5:29 PM   Specimen: Abscess  Result Value Ref Range Status   Specimen Description   Final    ABSCESS Performed at [facility], [address] [place], [place] [address]    Special Requests ILIACUS MUSCLE  Final   Gram Stain   Final    MODERATE WBC PRESENT,BOTH PMN AND MONONUCLEAR ABUNDANT GRAM POSITIVE COCCI IN PAIRS IN CLUSTERS    Culture   Final    ABUNDANT METHICILLIN RESISTANT STAPHYLOCOCCUS AUREUS NO ANAEROBES ISOLATED Performed at [facility], [place] [address] [place], [place] [address]    Report Status 08/07/2019 FINAL  Final   Organism ID, Bacteria METHICILLIN RESISTANT STAPHYLOCOCCUS AUREUS  Final      Susceptibility   Methicillin resistant staphylococcus aureus - MIC*    CIPROFLOXACIN >=8 RESISTANT Resistant     ERYTHROMYCIN >=8 RESISTANT Resistant     GENTAMICIN <=0.5 SENSITIVE Sensitive     OXACILLIN >=4 RESISTANT Resistant     TETRACYCLINE <=1 SENSITIVE Sensitive     VANCOMYCIN 1 SENSITIVE Sensitive     TRIMETH/SULFA <=10 SENSITIVE Sensitive     CLINDAMYCIN <=0.25 SENSITIVE Sensitive     RIFAMPIN <=0.5 SENSITIVE Sensitive     Inducible Clindamycin NEGATIVE Sensitive     * ABUNDANT METHICILLIN RESISTANT STAPHYLOCOCCUS AUREUS  MRSA PCR Screening     Status: Abnormal   Collection Time: 08/02/19  8:24 PM   Specimen: Nasal Mucosa; Nasopharyngeal  Result Value Ref Range Status   MRSA by PCR POSITIVE (A) NEGATIVE Final    Comment:        The GeneXpert MRSA Assay (FDA approved for NASAL specimens only), is one component of a comprehensive MRSA colonization surveillance  program. It is not intended to diagnose MRSA infection nor to guide or monitor treatment for MRSA infections. RESULT CALLED TO, READ BACK BY AND VERIFIED WITH: Gypsy Lore 08/02/19 @ 2143  [place] Performed at [facility], [address] [place], [place] [address]   CULTURE, BLOOD (ROUTINE X 2) w Reflex to ID Panel     Status: Abnormal   Collection Time: 08/03/19 10:17 AM   Specimen: BLOOD  Result Value Ref Range Status   Specimen Description   Final    BLOOD LEFT ANTECUBITAL Performed at [facility], [address], [place], [place] [address]    Special Requests   Final    BOTTLES DRAWN AEROBIC AND ANAEROBIC Blood Culture adequate volume Performed at [facility], [address], [place], [place] [address]    Culture  Setup Time   Final    GRAM POSITIVE COCCI IN BOTH AEROBIC AND ANAEROBIC BOTTLES CRITICAL VALUE NOTED.  VALUE IS CONSISTENT WITH PREVIOUSLY REPORTED AND CALLED VALUE. Performed at [facility], [address] [place], [place] [address]    Culture (A)  Final    STAPHYLOCOCCUS AUREUS SUSCEPTIBILITIES PERFORMED ON PREVIOUS CULTURE WITHIN THE LAST 5 DAYS. Performed at [facility], [place] [address], [place], [place] [address]    Report Status  08/06/2019 FINAL  Final  CULTURE, BLOOD (ROUTINE X 2) w Reflex to ID Panel     Status: Abnormal   Collection Time: 08/03/19 10:32 AM   Specimen: BLOOD  Result Value Ref Range Status   Specimen Description   Final    BLOOD BLOOD LEFT HAND Performed at [facility], [address] [place], [place] [address]    Special Requests   Final    BOTTLES DRAWN AEROBIC AND ANAEROBIC Blood Culture adequate volume Performed at [facility], [address] [place], [place] [address]    Culture  Setup Time   Final    GRAM POSITIVE COCCI IN BOTH AEROBIC AND ANAEROBIC BOTTLES CRITICAL RESULT CALLED TO, READ BACK BY AND VERIFIED WITH: [place] [id number] 08/04/19  HNM Performed at [facility], [address] [place], [place] [address]    Culture (A)  Final    STAPHYLOCOCCUS AUREUS SUSCEPTIBILITIES PERFORMED ON PREVIOUS CULTURE WITHIN THE LAST 5 DAYS. Performed at [facility], [place] [address], [place], [place] [address]    Report Status 08/06/2019 FINAL  Final  Culture, blood (Routine X 2) w Reflex to ID Panel     Status: None (Preliminary result)   Collection Time: 08/05/19  7:18 AM   Specimen: BLOOD  Result Value Ref Range Status   Specimen Description BLOOD LEFT ANTECUBITAL  Final   Special Requests   Final    BOTTLES DRAWN AEROBIC AND ANAEROBIC Blood Culture adequate volume   Culture   Final    NO GROWTH 2 DAYS Performed at [facility], [place] [address] [place], [place] [address]    Report Status PENDING  Incomplete  Culture, blood (Routine X 2) w Reflex to ID Panel     Status: None (Preliminary result)   Collection Time: 08/05/19  7:19 AM   Specimen: BLOOD LEFT HAND  Result Value Ref Range Status   Specimen Description BLOOD LEFT HAND  Final   Special Requests   Final    BOTTLES DRAWN AEROBIC AND ANAEROBIC Blood Culture adequate volume   Culture   Final    NO GROWTH 2 DAYS Performed at [facility], [place] [address], [place], [place] [address]    Report Status PENDING  Incomplete      Radiology Studies: CT SOFT TISSUE NECK W CONTRAST  Result Date: 08/06/2019 CLINICAL DATA:  Infection, soft tissue infection, abscess; soft tissue mass, neck, no prior imaging. Additional history provided: Swelling, pain, redness to right side of body where the neck meets the shoulder. EXAM: CT NECK WITH CONTRAST TECHNIQUE: Multidetector CT imaging of the neck was performed using the standard protocol following the bolus administration of intravenous contrast. CONTRAST:  86mL OMNIPAQUE IOHEXOL 350 MG/ML SOLN COMPARISON:  CT angiogram chest 08/02/2019 FINDINGS: Pharynx and larynx: No appreciable mass or swelling within the oral  cavity, pharynx or larynx. Salivary glands: No inflammation, mass, or stone. Thyroid: Unremarkable thyroid gland. Lymph nodes: No pathologically enlarged cervical chain lymph nodes. Vascular: The major vascular structures of the neck appear patent. Limited intracranial: No acute intracranial abnormality identified Visualized orbits: Visualized orbits demonstrate no acute abnormality. Mastoids and visualized paranasal sinuses: The bilateral maxillary sinuses demonstrate mild mucosal thickening and reactive osteitis. No significant mastoid effusion. Skeleton: No acute bony abnormality or aggressive osseous lesion. C5-C6 disc bulge. Upper chest: New from prior CTA chest 08/02/2019, there are numerous peripheral predominant pulmonary nodules within the imaged lung apices suspicious for septic emboli. There is a  loculated collection within the right pleural space along the lateral aspect of the right lung apex measuring 8.5 x 2.7 cm in transaxial dimensions. There is a punctate focus of gas within this collection. Findings likely reflect empyema. In retrospect, this finding was present on prior examination 08/02/2019, but has increased in size since this prior study. No definite erosion of the adjacent ribs. There is extension of low-density edema into the adjacent right pectoralis musculature measuring 3.9 x 2.2 cm in transaxial dimensions (for instance as seen series 3, image 104). Associated inflammatory stranding within the right retroclavicular region and lower right neck. Partially visualized left IJ approach central venous catheter. These results will be called to the ordering clinician or representative by the Radiologist Assistant, and communication documented in the PACS or zVision Dashboard. IMPRESSION: 1. Multiple peripheral predominant nodules within the imaged lung apices, new as compared to prior examination 08/02/2019 and highly suspicious for septic emboli. 2. Right pleural collection containing a  punctate focus of gas along the lateral aspect of the right lung apex likely reflecting empyema. Extension of low attenuation edema into the adjacent right pectoralis musculature. No peripheral enhancement is demonstrated at this site on the current examination to suggest formed abscess. Associated inflammatory stranding within the right retroclavicular region and right lower neck consistent with soft tissue infection. Dedicated chest CT is recommended for further evaluation. Electronically Signed   By: Kellie Simmering DO   On: 08/06/2019 18:39     Scheduled Meds: . Chlorhexidine Gluconate Cloth  6 each Topical Daily  . docusate sodium  100 mg Oral BID  . enoxaparin (LOVENOX) injection  40 mg Subcutaneous Q24H  . polyethylene glycol  17 g Oral Daily  . sodium chloride flush  10-40 mL Intracatheter Q12H  . traZODone  150 mg Oral QHS   Continuous Infusions: . vancomycin 2,000 mg (08/07/19 0630)     LOS: 4 days   Time Spent in minutes   45 minutes  Sorayah Schrodt D.O. on 08/07/2019 at 12:51 PM  Between 7am to 7pm - Please see pager noted on [email or web address]  After 7pm go to [email or web address]  And look for the night coverage person covering for me after hours  [facility]  [phone]

## 2019-08-08 ENCOUNTER — Inpatient Hospital Stay (HOSPITAL_COMMUNITY): Payer: Medicaid Other

## 2019-08-08 DIAGNOSIS — J869 Pyothorax without fistula: Secondary | ICD-10-CM

## 2019-08-08 LAB — COMPREHENSIVE METABOLIC PANEL
ALT: 53 U/L — ABNORMAL HIGH (ref 0–44)
AST: 35 U/L (ref 15–41)
Albumin: 1.7 g/dL — ABNORMAL LOW (ref 3.5–5.0)
Alkaline Phosphatase: 122 U/L (ref 38–126)
Anion gap: 9 (ref 5–15)
BUN: 7 mg/dL (ref 6–20)
CO2: 24 mmol/L (ref 22–32)
Calcium: 8 mg/dL — ABNORMAL LOW (ref 8.9–10.3)
Chloride: 102 mmol/L (ref 98–111)
Creatinine, Ser: 0.45 mg/dL (ref 0.44–1.00)
GFR calc Af Amer: >60 mL/min (ref >60)
GFR calc non Af Amer: >60 mL/min (ref >60)
Glucose, Bld: 148 mg/dL — ABNORMAL HIGH (ref 70–99)
Potassium: 4.1 mmol/L (ref 3.5–5.1)
Sodium: 135 mmol/L (ref 135–145)
Total Bilirubin: 0.3 mg/dL (ref 0.3–1.2)
Total Protein: 5.7 g/dL — ABNORMAL LOW (ref 6.5–8.1)

## 2019-08-08 LAB — CBC
HCT: 30.8 % — ABNORMAL LOW (ref 36.0–46.0)
Hemoglobin: 10 g/dL — ABNORMAL LOW (ref 12.0–15.0)
MCH: 28.1 pg (ref 26.0–34.0)
MCHC: 32.5 g/dL (ref 30.0–36.0)
MCV: 86.5 fL (ref 80.0–100.0)
Platelets: 464 10*3/uL — ABNORMAL HIGH (ref 150–400)
RBC: 3.56 MIL/uL — ABNORMAL LOW (ref 3.87–5.11)
RDW: 15.3 % (ref 11.5–15.5)
WBC: 11.9 10*3/uL — ABNORMAL HIGH (ref 4.0–10.5)
nRBC: 0 % (ref 0.0–0.2)

## 2019-08-08 LAB — MAGNESIUM: Magnesium: 2 mg/dL (ref 1.7–2.4)

## 2019-08-08 IMAGING — MR MR [PERSON_NAME]*[PERSON_NAME]* W/O CM
4 of 5 series · 19 of 40 positions shown · non-contrast
Comparison: Radiographs [DATE]

CLINICAL DATA: Hand pain swelling.

EXAM:
MRI OF THE RIGHT HAND WITHOUT CONTRAST
TECHNIQUE: Multiplanar, multisequence MR imaging of the right hand was
performed. No intravenous contrast was administered.

[Series 3: T1 · axial · 5.0mm · 0.23mm/px · z∈[-146,-31]mm · 3 of 31 slices shown (1 of 2)]
[im 4/31]
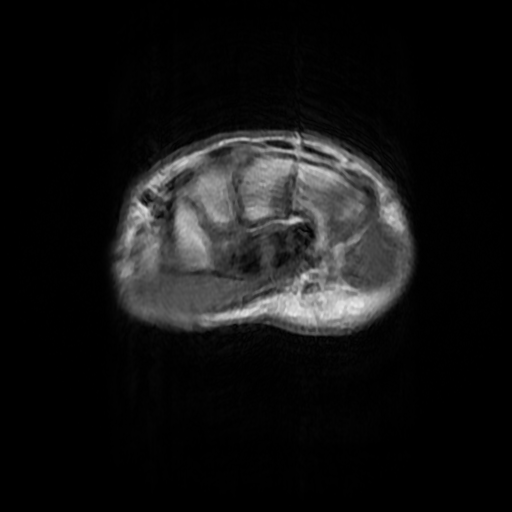
[im 16/31]
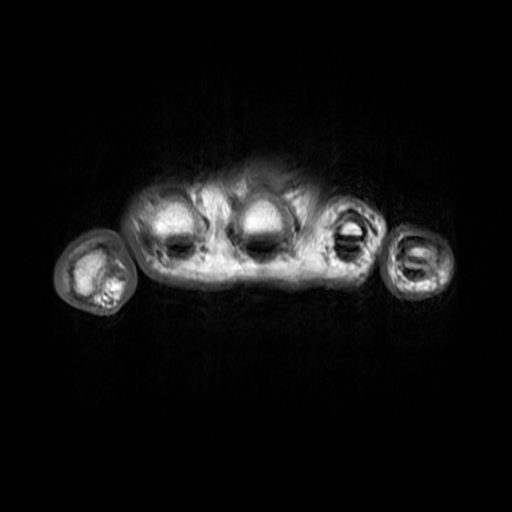
[im 27/31]
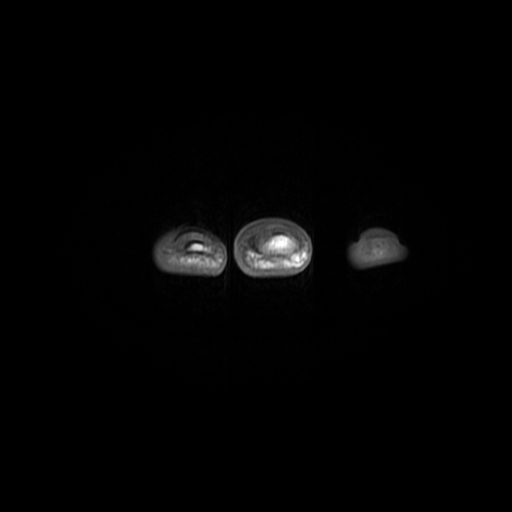

[Series 4: T2 fat-sat · axial · 5.0mm · 0.23mm/px · z∈[-161,-31]mm · 4 of 31 slices shown]
[im 1/31]
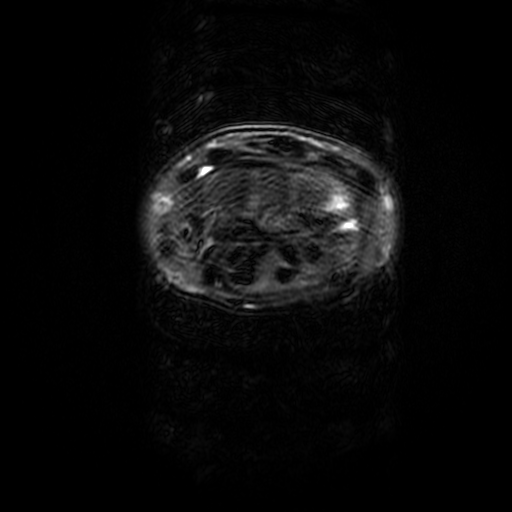
[im 4/31]
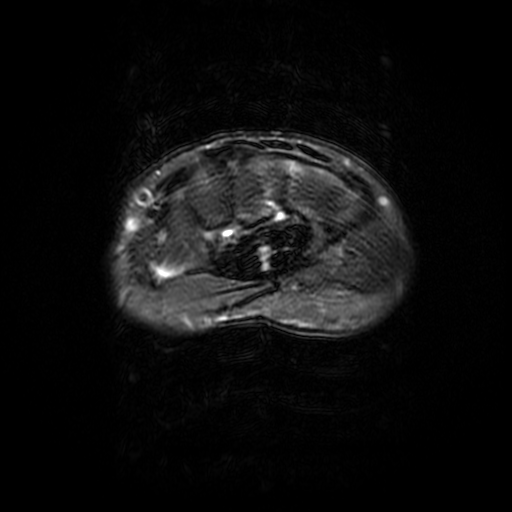
[im 17/31]
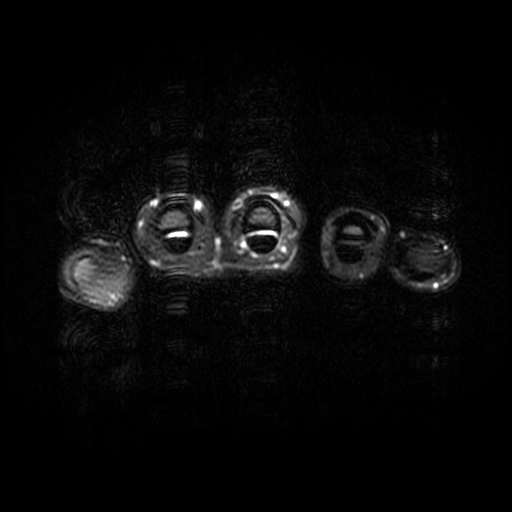
[im 27/31]
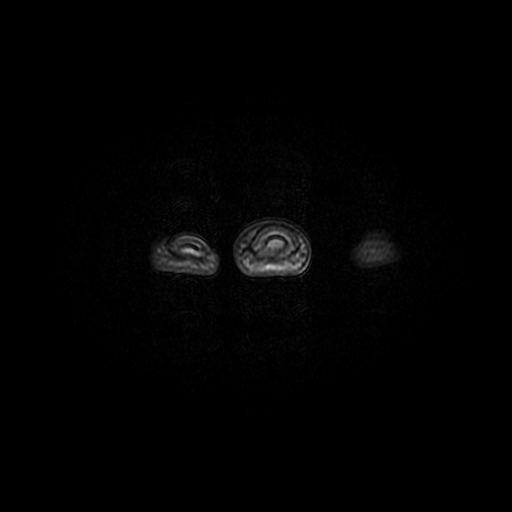

[Series 5: T1 · coronal · 3.0mm · 0.39mm/px · 3 of 17 slices shown (2 of 2)]
[im 4/17]
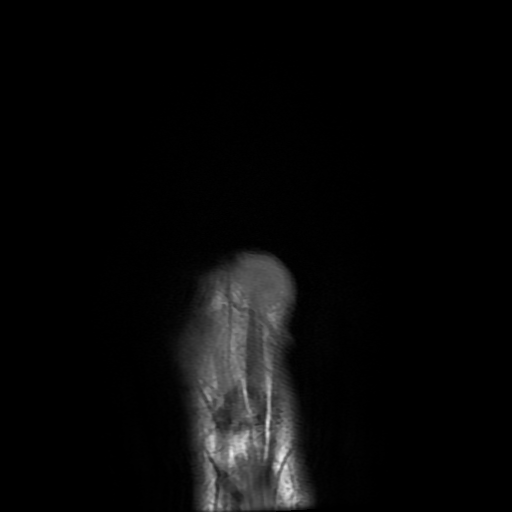
[im 10/17]
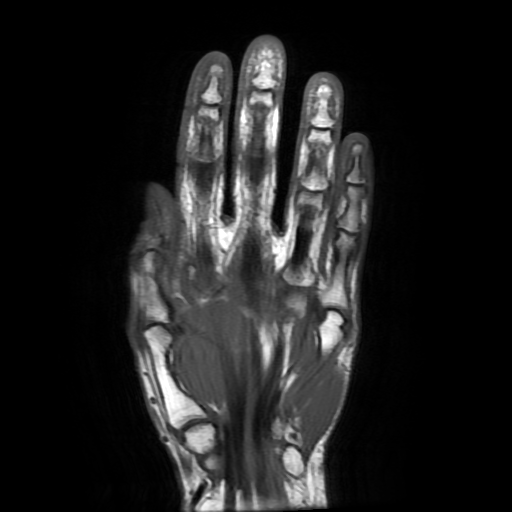
[im 17/17]
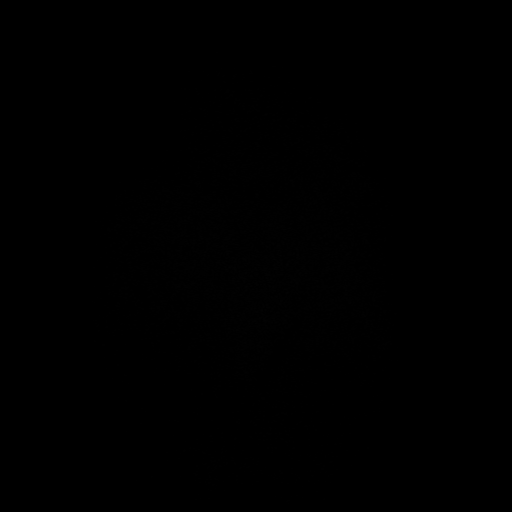

[Series 8: PD fat-sat · oblique · 3.0mm · 0.39mm/px · 9 of 26 slices shown]
[im 1/26]
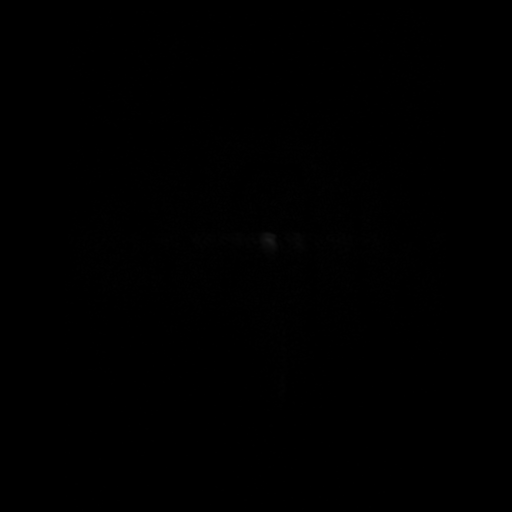
[im 4/26]
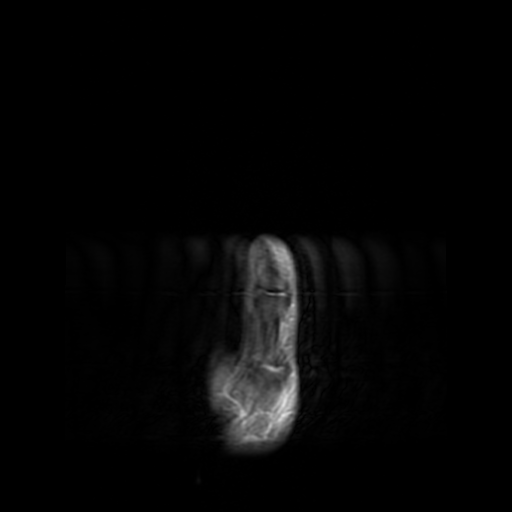
[im 7/26]
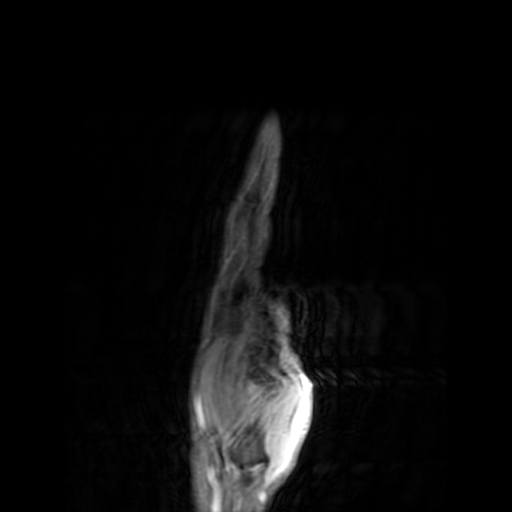
[im 10/26]
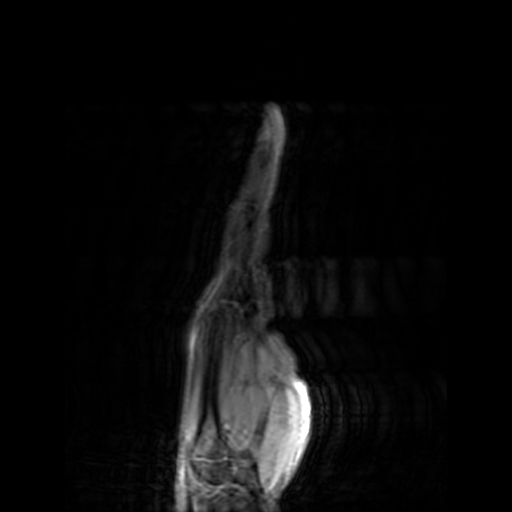
[im 13/26]
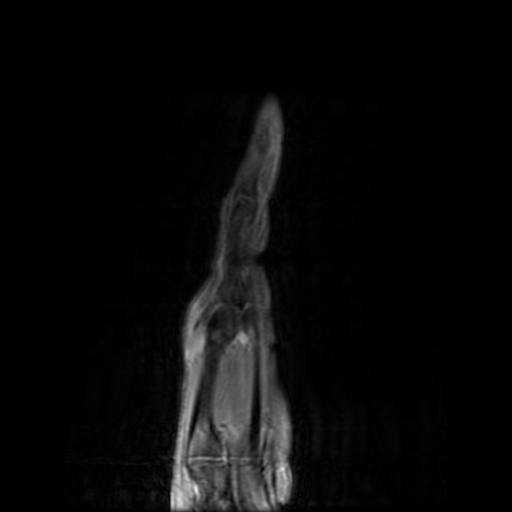
[im 16/26]
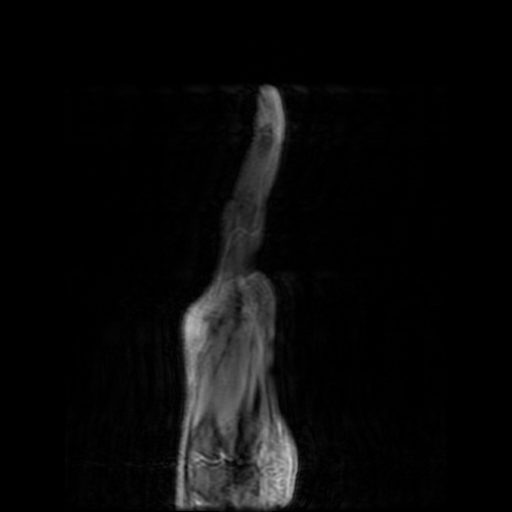
[im 19/26]
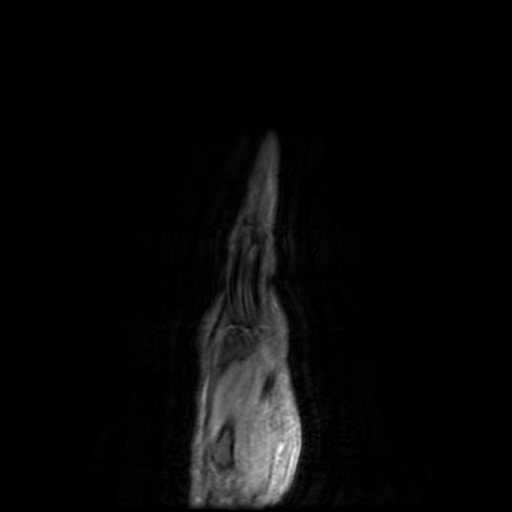
[im 22/26]
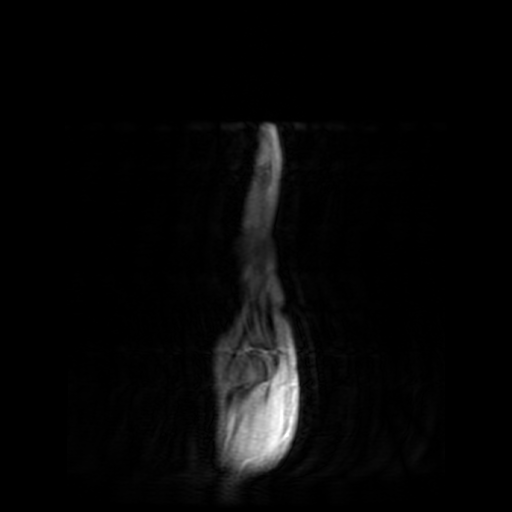
[im 26/26]
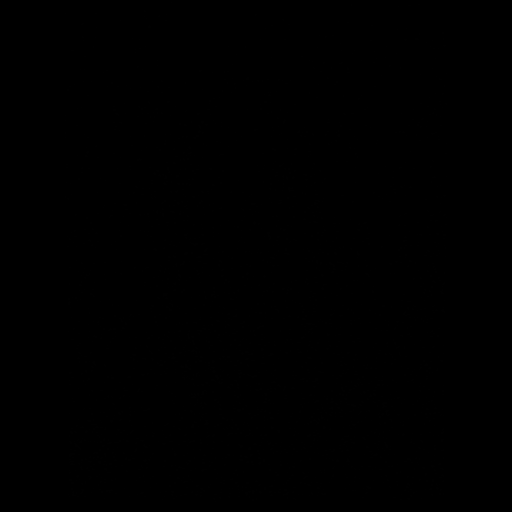

[19 of 40 positions shown; findings below may reference images not displayed]

FINDINGS: Focal inflammatory changes around the third MCP joint with soft
tissue swelling/edema mainly around the dorsum of the hand. Somewhat
ill-defined fluid collection measures 14 mm and is superficial to
the extensor tendon. The does appear to be some inflammatory
tenosynovitis also. A small joint effusion is noted but I do not see
any definite MR findings for septic arthritis or osteomyelitis. The
flexor tendons appear normal. The radial and ulnar collateral
ligaments are intact.

No findings for diffuse cellulitis or myofasciitis.
IMPRESSION: 1. MR focal inflammatory changes around the third MCP joints as
discussed above. Possible ill-defined superficial abscess dorsally.
2. No findings for septic arthritis or osteomyelitis.
3. No findings for diffuse cellulitis or myofasciitis.

## 2019-08-08 IMAGING — CT CT CHEST W/O CM
2 of 5 series · 14 of 36 positions shown, 17 images · non-contrast
Comparison: Chest radiograph [DATE], chest CT [DATE]

CLINICAL DATA: Empyema. Sepsis secondary to MRSA bacteremia.
Leukocytosis, tachycardia and tachypnea.

EXAM:
CT CHEST WITHOUT CONTRAST
TECHNIQUE: Multidetector CT imaging of the chest was performed following the
standard protocol without IV contrast.

[Series 5: thorax 2.0 · axial · 0.86mm/px · z∈[+968,+1284]mm · 11 of 190 slices shown, 14 images]
[im 16/190  mediastinal]
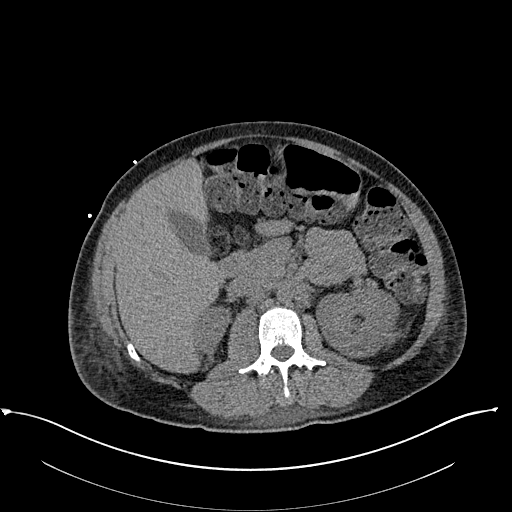
[im 16/190  lung]
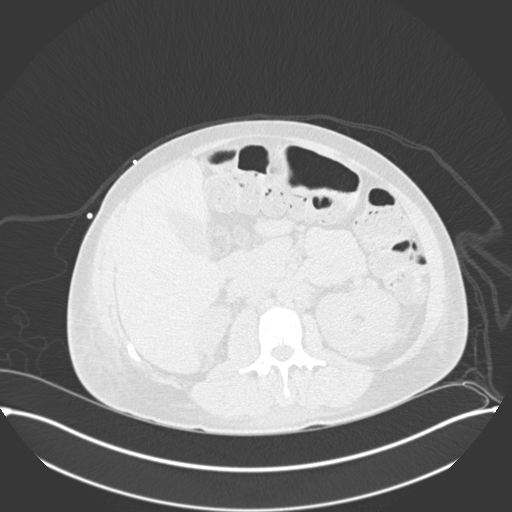
[im 32/190  lung]
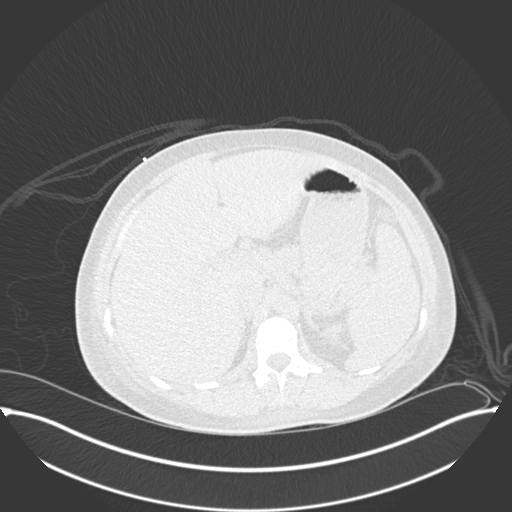
[im 48/190  lung]
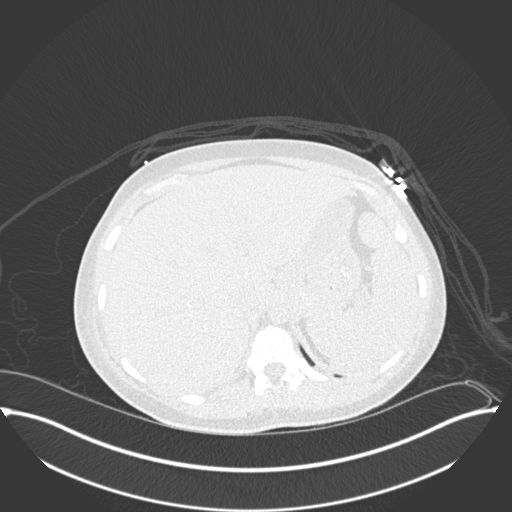
[im 64/190  lung]
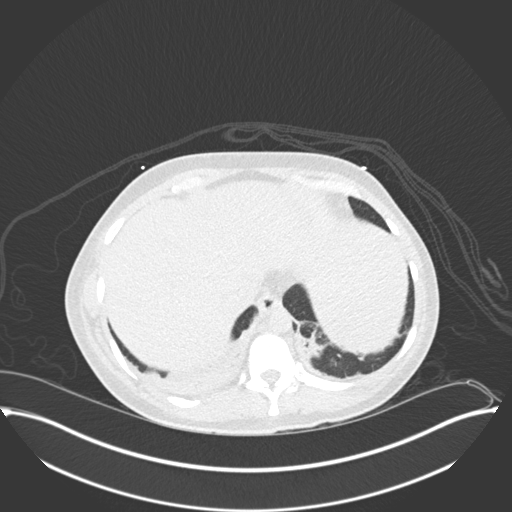
[im 79/190  mediastinal]
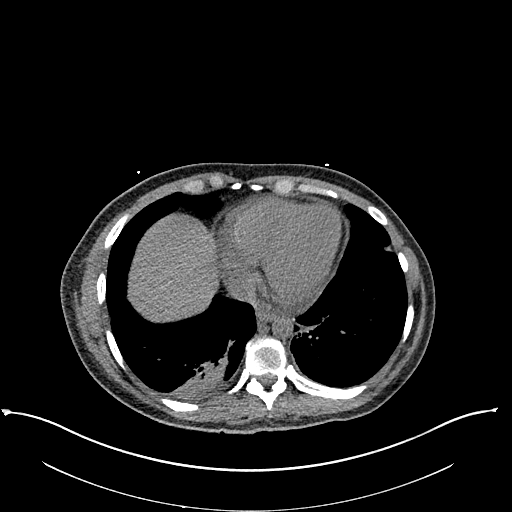
[im 79/190  lung]
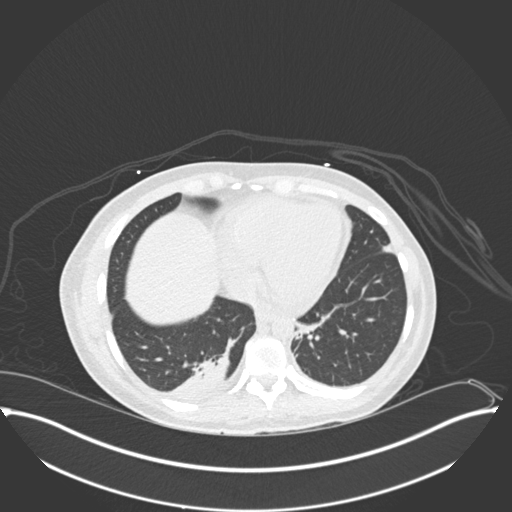
[im 95/190  lung]
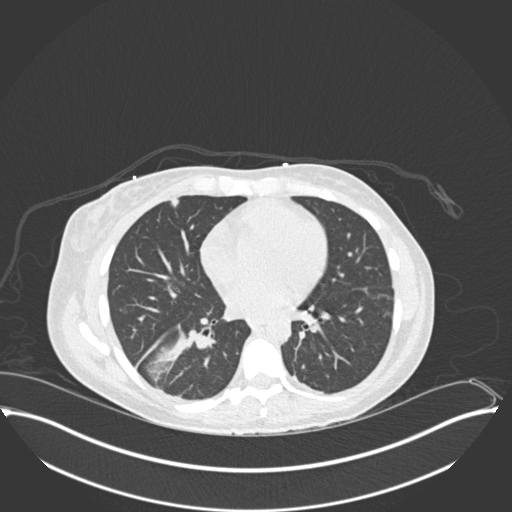
[im 111/190  lung]
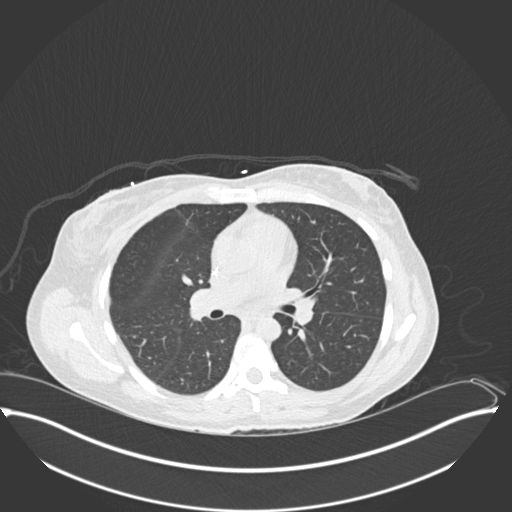
[im 127/190  lung]
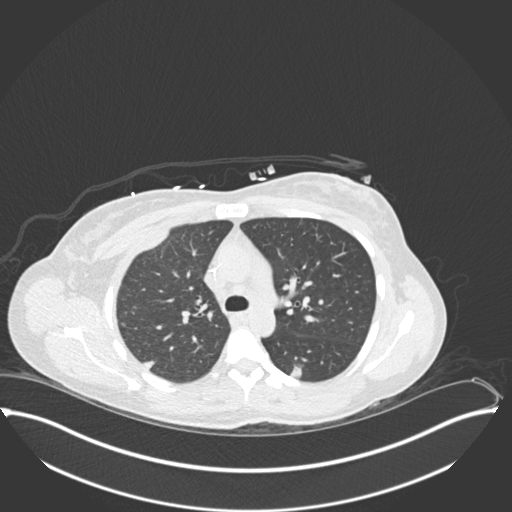
[im 142/190  mediastinal]
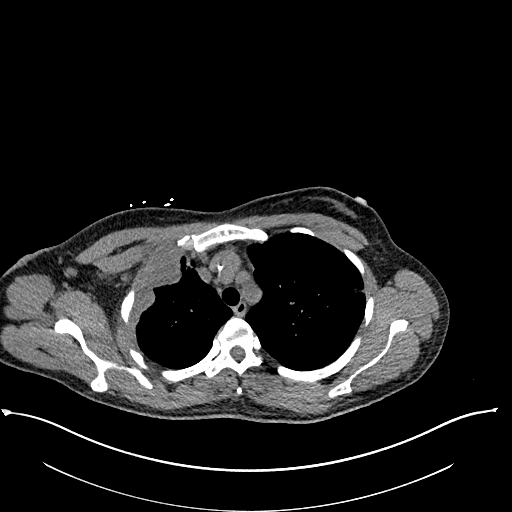
[im 142/190  lung]
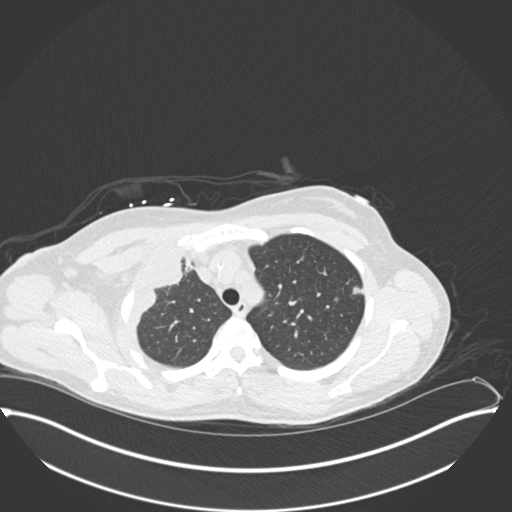
[im 158/190  lung]
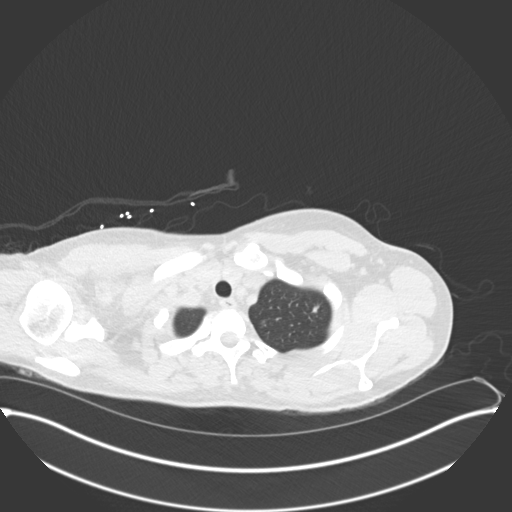
[im 174/190  lung]
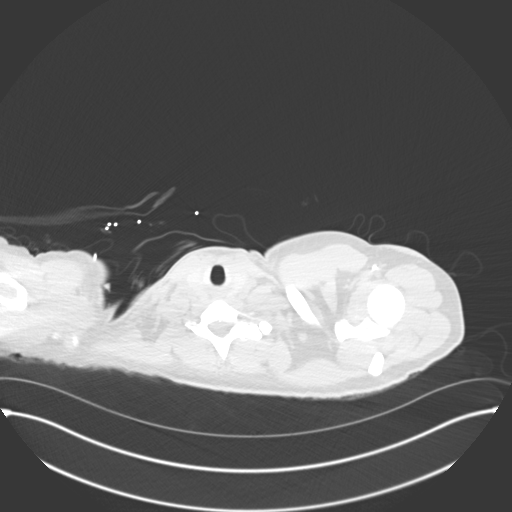

[Series 7: coronal · coronal · 0.74mm/px · 3 of 105 slices shown]
[im 21/105  lung]
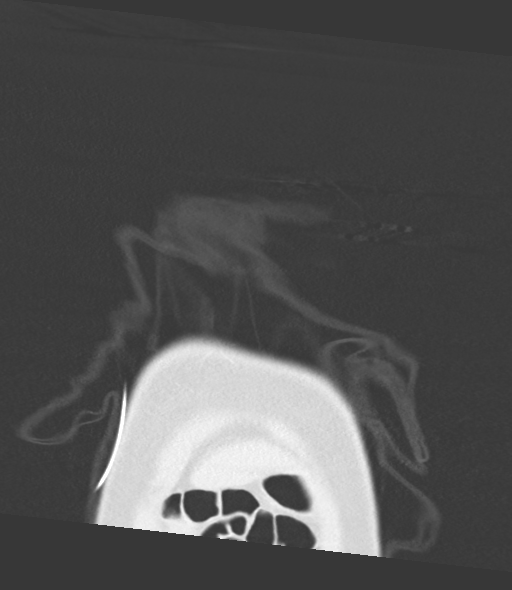
[im 42/105  lung]
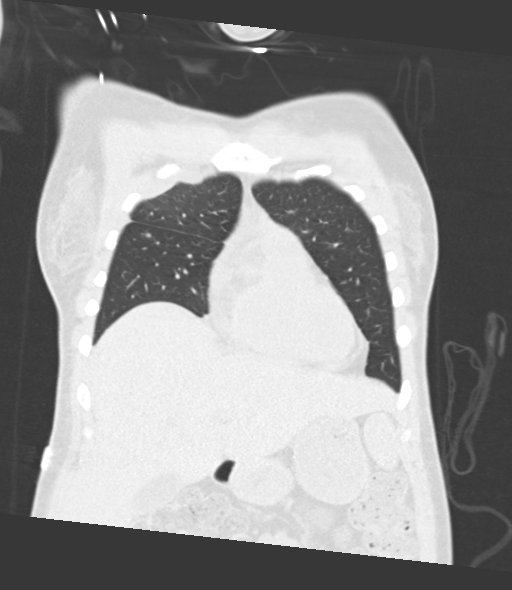
[im 63/105  lung]
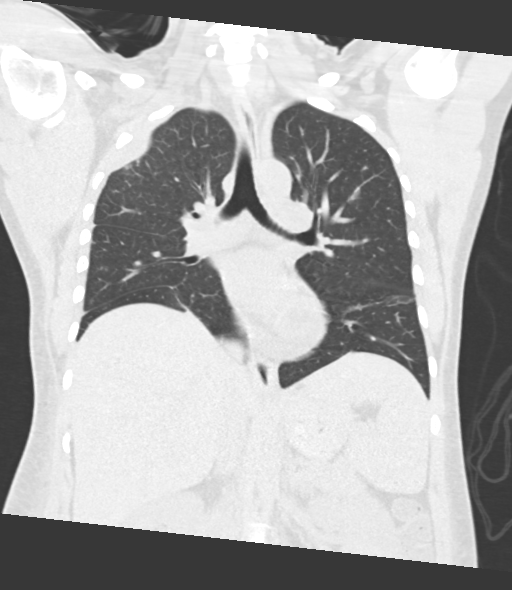

[14 of 36 positions shown; findings below may reference images not displayed]

FINDINGS: Cardiovascular: Left upper extremity PICC in place. No aortic
aneurysm. Heart is normal in size. Small pericardial effusion
measuring 12 mm in depth inferiorly.

Mediastinum/Nodes: Small mediastinal lymph nodes not enlarged by
size criteria. Limited assessment for hilar adenopathy given lack of
IV contrast. No esophageal wall thickening.

Lungs/Pleura: Dependent consolidation in right lower lobe has air
bronchograms, suspicious for pneumonia. Minimal dependent right
pleural thickening without frank effusion. Loculated pleural fluid
at the anterior right upper lobe measures up to 3 cm, increased
since prior. Multiple small pulmonary nodules and nodular densities
are new from prior exam, likely infectious. No cavitary lesions.
Streaky opacities in the dependent left lower lobe, likely
atelectasis.

Upper Abdomen: No acute findings on noncontrast exam. Moderate
volume of stool in the included colon.

Musculoskeletal: Soft tissue thickening involving the right first
and second intercostal muscles again seen, less well-defined
currently in the absence of IV contrast. There is soft tissue edema
in the adjacent right axilla, no subcutaneous fluid collection. No
periosteal reaction or bony destructive change.
IMPRESSION: 1. Small loculated pleural effusion at the right lung apex adjacent
to thickening of the intercostal muscles, slightly increased in size
from CT 6 days ago.
2. Scattered nodular densities throughout both lungs, also new,
likely infectious. No definite cavitary lesions. Dependent right
lower lobe consolidation consistent with pneumonia. Minimal right
pleural thickening without sub pulmonic effusion.
3. Small pericardial effusion.

## 2019-08-08 MED ORDER — SODIUM CHLORIDE 0.9% FLUSH: 10.0000 mL | INTRAVENOUS | Status: DC | PRN

## 2019-08-08 MED ORDER — SODIUM CHLORIDE 0.9 % IV SOLN
INTRAVENOUS | Status: DC | PRN
  Administered 2019-08-08: 500 mL via INTRAVENOUS
  Administered 2019-08-08 – 2019-09-01 (×5): 250 mL via INTRAVENOUS

## 2019-08-08 MED ORDER — HYDROMORPHONE HCL 1 MG/ML IJ SOLN
1.0000 mg | INTRAMUSCULAR | Status: DC | PRN
  Administered 2019-08-08 – 2019-08-12 (×22): 1 mg via INTRAVENOUS
  Filled 2019-08-08 (×23): qty 1

## 2019-08-08 MED ORDER — LORAZEPAM 2 MG/ML IJ SOLN
1.0000 mg | Freq: Once | INTRAMUSCULAR | Status: AC
  Administered 2019-08-08: 1 mg via INTRAVENOUS
  Filled 2019-08-08: qty 1

## 2019-08-08 NOTE — Progress Notes (Addendum)
Late Entry for 03.08.21 @ 1954: Patient seen and assessed. Patient found resting in bed watching late PM television.Physical assessment completed via computerized charting per [facility] policy. C/O pain to right hip expressed. PRN fentanyl IV given. Will reassess effectiveness. IV site to right forearm noted. Maintenance saline flush stopped. Side rails up x 2. Bed in lowest position and locked. No family present at bedside.   Nursing rounds to re evaluate pain 08/08/19 @ 0543- Nursing rounds completed. Patient could be hearing moaning outside room with closed door. Pain continues at 10/10 scale. Explained to patient that Fenanyl IV was last administered at 0435 and oxycodone 10 mg was last administered at 0255. Explained that next dose of fentanyl IV can not be administered unitl 0634 and oxycodone 10 mg PO can not be administered until 0655. Patient makes request for PRN Tylenol. Will comply with request "my fever is coming back" per patient. Oral temperature of 99.2 noted.

## 2019-08-08 NOTE — Progress Notes (Signed)
Peripherally Inserted Central Catheter/Midline Placement  The IV Nurse has discussed with the patient and/or persons authorized to consent for the patient, the purpose of this procedure and the potential benefits and risks involved with this procedure.  The benefits include less needle sticks, lab draws from the catheter, and the patient may be discharged home with the catheter. Risks include, but not limited to, infection, bleeding, blood clot (thrombus formation), and puncture of an artery; nerve damage and irregular heartbeat and possibility to perform a PICC exchange if needed/ordered by physician.  Alternatives to this procedure were also discussed.  Bard Power PICC patient education guide, fact sheet on infection prevention and patient information card has been provided to patient /or left at bedside.    PICC  Placement Documentation  PICC Single Lumen [id number] PICC Left Basilic 40 cm 0 cm (Active)  Indication for Insertion or Continuance of Line Prolonged intravenous therapies 08/08/19 1002  Exposed Catheter (cm) 0 cm 08/08/19 1002  Site Assessment Clean;Dry;Intact 08/08/19 1002  Line Status Flushed;Blood return noted;Saline locked 08/08/19 1002  Dressing Type Transparent 08/08/19 1002  Dressing Status Clean;Dry;Intact;Antimicrobial disc in place 08/08/19 1002  Dressing Change Due 08/15/19 08/08/19 1002       Xaine Sansom, Flovilla Ramos 08/08/2019, 10:06 AM

## 2019-08-08 NOTE — Progress Notes (Signed)
PROGRESS NOTE    Leslie Molina  [id number] DOB: [birth year] DOA: 08/03/2019 PCP: [facility]   Brief Narrative:  HPI on 08/02/2019 by Dr. Royce Macadamia Agbata Leslie Molina is a 34 y.o. female with medical history significant for bipolar disorder, anxiety, history of IV drug use presents to the emergency room twice within 1 week and lower back pain as well as worsening pain in her right lower extremity.  Was seen in the emergency room about a week ago and was discharged on gabapentin.  She was febrile during that visit had a Covid test done and was asked to quarantine until the results became available.  She returns to the emergency room today febrile with a T-max of 102F, she is also tachycardic and acutely ill-appearing.  She complains of worsening pain in her right extremity which she rated 10 out of 10 in intensity at its worse.  She denied having any trauma.  Denied having any sick contacts.  She denied having any cough, shortness of breath, nausea, vomiting, diaphoresis or palpitations.  She denied having any abdominal pain, no frequency of urination, dysuria or nocturia. She has not been able to bear weight on her right leg. She had an MRI of the right hip which showed extensive right iliopsoas abscess.  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  Myositis of the gluteus medius, maximus and minimus.  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  The abscess extends through the posterior aspect of the proximal right thigh  Interim history Admitted with sepsis secondary to mrsa bacteremia. ID consulted.  She also found to have empyema CT soft cardiothoracic surgery consulted and pending.  Assessment & Plan   Sepsis secondary to MRSA bacteremia, possible source empyema -Noted on admission, as patient did have leukocytosis, tachycardia, tachypnea -Noted to have seeding multiple areas causing osteomyelitis, iliopsoas and gluteal abscess -Patient continues to have  fever as well as tachycardia -Infectious disease consulted and appreciated -Continue vancomycin -Blood cultures from 08/03/2019 showed Staph aureus -Blood culture from 08/05/2019 showed no growth to date -CT soft tissue neck on 08/06/2019 showed multiple peripheral predominant nodules within the lung apices, suspicious for septic emboli.  Right pleural collection containing a punctate focus of gas along the lateral aspect of the right lung apex likely reflecting empyema.  Extension of edema into the right pectoralis musculature.  Inflammatory stranding within the right retroclavicular region and right lower neck consistent with soft tissue infection. -Discussed with Dr. Orvan Seen on 3/8, CT finding with cardiothoracic surgery, consulted and appreciated. Pending consultation   Iliopsoas/gluteal abscess/osteomyelitis of sacrum and ischium/possible infection of right hand dorsum and right sternoclavicular joint -Noted on MRI of right hip and lumbar spine -Infectious disease and orthopedics consulted and appreciated -Pending MRI of the right hand -patient complaining of pain, will change pain medications to IV Dilaudid along with oxycodone p.o.  Peritonitis/IUD perforation -CT scan -Gynecology consulted and appreciated as well as general surgery (see General surgery signed off) -Telemetry pain control -No surgical intervention at this time  Hypokalemia -Resolved with replacement, continue to monitor BMP  Bipolar disease/anxiety and depression -Home medications Cymbalta, Seroquel, trazodone held  DVT Prophylaxis  lovenox  Code Status: Full  Family Communication: None at bedside.  Disposition Plan: Admitted from home. Here with MRSA bacteremia. Continues to have fever. ID consulted. Currently on IV vancomycin. Cardiothoracic surgery consulted and pending. Dispo TBD.  Consultants PCCM Gynecology Orthopedic surgery Infectious disease General surgery Cardiothoracic surgery  Procedures  None  Antibiotics   Anti-infectives (From admission, onward)   Start     Dose/Rate Route Frequency Ordered Stop   08/07/19 1800  vancomycin (VANCOREADY) IVPB 1500 mg/300 mL     1,500 mg 150 mL/hr over 120 Minutes Intravenous Every 8 hours 08/07/19 1344     08/06/19 2300  vancomycin (VANCOREADY) IVPB 2000 mg/400 mL  Status:  Discontinued     2,000 mg 200 mL/hr over 120 Minutes Intravenous Every 8 hours 08/06/19 2230 08/07/19 1344   08/05/19 1400  vancomycin (VANCOCIN) IVPB 1000 mg/200 mL premix  Status:  Discontinued     1,000 mg 200 mL/hr over 60 Minutes Intravenous Every 8 hours 08/05/19 0815 08/06/19 2229   08/03/19 2200  ceFEPIme (MAXIPIME) 2 g in sodium chloride 0.9 % 100 mL IVPB  Status:  Discontinued     2 g 200 mL/hr over 30 Minutes Intravenous Every 8 hours 08/03/19 1851 08/04/19 1027   08/03/19 2100  vancomycin (VANCOREADY) IVPB 750 mg/150 mL  Status:  Discontinued     750 mg 150 mL/hr over 60 Minutes Intravenous Every 8 hours 08/03/19 1851 08/05/19 0815   08/03/19 2000  clindamycin (CLEOCIN) IVPB 900 mg  Status:  Discontinued     900 mg 100 mL/hr over 30 Minutes Intravenous Every 8 hours 08/03/19 1851 08/04/19 1027      Subjective:   Leslie Molina seen and examined today.  Patient complains of lots of pain and does not feel that the pain regimen is adequate.  Denies current chest pain or shortness of breath, abdominal pain, nausea or vomiting, diarrhea or constipation, dizziness or headache.    Objective:   Vitals:   08/08/19 0424 08/08/19 0900 08/08/19 1133 08/08/19 1257  BP: 97/63 102/65    Pulse:    (!) 129  Resp: 16     Temp: 98.9 F (37.2 C) 99.5 F (37.5 C) (!) 103.1 F (39.5 C) (!) 102.9 F (39.4 C)  TempSrc: Oral Oral Oral Oral  SpO2:    99%  Weight:      Height:        Intake/Output Summary (Last 24 hours) at 08/08/2019 1335 Last data filed at 08/08/2019 1259 Gross per 24 hour  Intake 1641.42 ml  Output 3650 ml  Net -2008.58 ml   Filed Weights    08/05/19 0402 08/06/19 0355 08/07/19 0500  Weight: 80.2 kg 73.6 kg 73.5 kg   Exam  General: Well developed, acutely ill-appearing, NAD  HEENT: NCAT, mucous membranes moist.   Cardiovascular: S1 S2 auscultated, RRR, no murmur  Respiratory: Right diminished breath sounds, otherwise clear  Abdomen: Soft, nontender, nondistended, + bowel sounds  Extremities: warm dry without cyanosis clubbing or edema  Neuro: AAOx3, nonfocal  Psych: Appropriate mood and affect  Data Reviewed: I have personally reviewed following labs and imaging studies  CBC: Recent Labs  Lab 08/02/19 0843 08/02/19 0843 08/03/19 1847 08/03/19 1847 08/04/19 0500 08/05/19 0530 08/06/19 0541 08/07/19 0615 08/08/19 0339  WBC 14.0*   < > 12.1*   < > 13.0* 11.0* 11.0* 13.3* 11.9*  NEUTROABS 12.3*  --  10.5*  --   --   --   --   --   --   HGB 11.8*   < > 9.7*   < > 9.7* 9.5* 10.4* 10.1* 10.0*  HCT 33.7*   < > 28.3*   < > 29.3* 28.2* 31.5* 31.6* 30.8*  MCV 82.0   < > 83.7   < > 84.7 84.4 85.1 87.1  86.5  PLT 304   < > 280   < > 303 386 437* 425* 464*   < > = values in this interval not displayed.   Basic Metabolic Panel: Recent Labs  Lab 08/03/19 1847 08/03/19 1847 08/04/19 0500 08/05/19 0530 08/06/19 0541 08/07/19 0615 08/08/19 0339  NA 134*   < > 136 135 135 139 135  K 2.9*   < > 2.8* 3.1* 3.1* 4.6 4.1  CL 102   < > 102 98 96* 106 102  CO2 22   < > 23 25 28 25 24   GLUCOSE 121*   < > 111* 149* 141* 112* 148*  BUN 6   < > 5* <5* <5* <5* 7  CREATININE 0.55   < > 0.54 0.58 0.54 0.36* 0.45  CALCIUM 7.6*   < > 7.7* 7.6* 7.7* 7.8* 8.0*  MG 1.9  --   --  1.8 1.9 2.1 2.0  PHOS 2.2*  --   --   --   --   --   --    < > = values in this interval not displayed.   GFR: Estimated Creatinine Clearance: 90.9 mL/min (by C-G formula based on SCr of 0.45 mg/dL). Liver Function Tests: Recent Labs  Lab 08/03/19 0250 08/05/19 0530 08/06/19 0541 08/07/19 0615 08/08/19 0339  AST 24 56* 57* 29 35  ALT 18 36 48*  42 53*  ALKPHOS 83 121 127* 106 122  BILITOT 0.5 0.5 0.5 0.7 0.3  PROT 6.3* 5.2* 5.6* 5.4* 5.7*  ALBUMIN 2.3* 1.7* 1.8* 1.7* 1.7*   No results for input(s): LIPASE, AMYLASE in the last 168 hours. No results for input(s): AMMONIA in the last 168 hours. Coagulation Profile: Recent Labs  Lab 08/03/19 0250  INR 1.3*   Cardiac Enzymes: Recent Labs  Lab 08/02/19 0843 08/04/19 0500  CKTOTAL 410* 104   BNP (last 3 results) No results for input(s): PROBNP in the last 8760 hours. HbA1C: No results for input(s): HGBA1C in the last 72 hours. CBG: Recent Labs  Lab 08/02/19 2113  GLUCAP 127*   Lipid Profile: No results for input(s): CHOL, HDL, LDLCALC, TRIG, CHOLHDL, LDLDIRECT in the last 72 hours. Thyroid Function Tests: No results for input(s): TSH, T4TOTAL, FREET4, T3FREE, THYROIDAB in the last 72 hours. Anemia Panel: No results for input(s): VITAMINB12, FOLATE, FERRITIN, TIBC, IRON, RETICCTPCT in the last 72 hours. Urine analysis:    Component Value Date/Time   COLORURINE YELLOW (A) 08/02/2019 0917   APPEARANCEUR CLEAR (A) 08/02/2019 0917   APPEARANCEUR Clear 04/03/2014 0059   LABSPEC 1.008 08/02/2019 0917   LABSPEC 1.016 04/03/2014 0059   PHURINE 6.0 08/02/2019 0917   GLUCOSEU NEGATIVE 08/02/2019 0917   GLUCOSEU Negative 04/03/2014 0059   HGBUR NEGATIVE 08/02/2019 0917   BILIRUBINUR NEGATIVE 08/02/2019 0917   BILIRUBINUR Negative 04/03/2014 0059   KETONESUR NEGATIVE 08/02/2019 0917   PROTEINUR NEGATIVE 08/02/2019 0917   NITRITE NEGATIVE 08/02/2019 0917   LEUKOCYTESUR NEGATIVE 08/02/2019 0917   LEUKOCYTESUR Negative 04/03/2014 0059   Sepsis Labs: @LABRCNTIP (procalcitonin:4,lacticidven:4)  ) Recent Results (from the past 240 hour(s))  Respiratory Panel by RT PCR (Flu A&B, Covid) - Nasopharyngeal Swab     Status: None   Collection Time: 08/02/19 11:20 AM   Specimen: Nasopharyngeal Swab  Result Value Ref Range Status   SARS Coronavirus 2 by RT PCR NEGATIVE  NEGATIVE Final    Comment: (NOTE) SARS-CoV-2 target nucleic acids are NOT DETECTED. The SARS-CoV-2 RNA is generally detectable in upper respiratoy specimens  during the acute phase of infection. The lowest concentration of SARS-CoV-2 viral copies this assay can detect is 131 copies/mL. A negative result does not preclude SARS-Cov-2 infection and should not be used as the sole basis for treatment or other patient management decisions. A negative result may occur with  improper specimen collection/handling, submission of specimen other than nasopharyngeal swab, presence of viral mutation(s) within the areas targeted by this assay, and inadequate number of viral copies (<131 copies/mL). A negative result must be combined with clinical observations, patient history, and epidemiological information. The expected result is Negative. Fact Sheet for Patients:  PinkCheek.be Fact Sheet for Healthcare Providers:  GravelBags.it This test is not yet ap proved or cleared by the [place] FDA and  has been authorized for detection and/or diagnosis of SARS-CoV-2 by FDA under an Emergency Use Authorization (EUA). This EUA will remain  in effect (meaning this test can be used) for the duration of the COVID-19 declaration under Section 564(b)(1) of the Act, 21 U.S.C. section 360bbb-3(b)(1), unless the authorization is terminated or revoked sooner.    Influenza A by PCR NEGATIVE NEGATIVE Final   Influenza B by PCR NEGATIVE NEGATIVE Final    Comment: (NOTE) The Xpert Xpress SARS-CoV-2/FLU/RSV assay is intended as an aid in  the diagnosis of influenza from Nasopharyngeal swab specimens and  should not be used as a sole basis for treatment. Nasal washings and  aspirates are unacceptable for Xpert Xpress SARS-CoV-2/FLU/RSV  testing. Fact Sheet for Patients: PinkCheek.be Fact Sheet for Healthcare  Providers: GravelBags.it This test is not yet approved or cleared by the [place] FDA and  has been authorized for detection and/or diagnosis of SARS-CoV-2 by  FDA under an Emergency Use Authorization (EUA). This EUA will remain  in effect (meaning this test can be used) for the duration of the  Covid-19 declaration under Section 564(b)(1) of the Act, 21  U.S.C. section 360bbb-3(b)(1), unless the authorization is  terminated or revoked. Performed at [facility], [address] [place], [place] [address]   Blood culture (routine x 2)     Status: Abnormal   Collection Time: 08/02/19 11:20 AM   Specimen: BLOOD  Result Value Ref Range Status   Specimen Description   Final    BLOOD RIGHT ANTECUBITAL Performed at [facility], [address], [place], [place] [address]    Special Requests   Final    BOTTLES DRAWN AEROBIC AND ANAEROBIC Blood Culture adequate volume Performed at [facility], [address] [place], [place] [address]    Culture  Setup Time   Final    IN BOTH AEROBIC AND ANAEROBIC BOTTLES GRAM POSITIVE COCCI CRITICAL RESULT CALLED TO, READ BACK BY AND VERIFIED WITH: [place] @2022  08/02/19 Wallace Performed at [facility], [address] [place], [place] [address]    Culture METHICILLIN RESISTANT STAPHYLOCOCCUS AUREUS (A)  Final   Report Status 08/05/2019 FINAL  Final   Organism ID, Bacteria METHICILLIN RESISTANT STAPHYLOCOCCUS AUREUS  Final      Susceptibility   Methicillin resistant staphylococcus aureus - MIC*    CIPROFLOXACIN >=8 RESISTANT Resistant     ERYTHROMYCIN >=8 RESISTANT Resistant     GENTAMICIN <=0.5 SENSITIVE Sensitive     OXACILLIN >=4 RESISTANT Resistant     TETRACYCLINE <=1 SENSITIVE Sensitive     VANCOMYCIN <=0.5 SENSITIVE Sensitive     TRIMETH/SULFA <=10 SENSITIVE Sensitive     CLINDAMYCIN <=0.25 SENSITIVE Sensitive     RIFAMPIN <=0.5 SENSITIVE Sensitive  Inducible  Clindamycin NEGATIVE Sensitive     * METHICILLIN RESISTANT STAPHYLOCOCCUS AUREUS  Blood culture (routine x 2)     Status: Abnormal   Collection Time: 08/02/19 11:20 AM   Specimen: BLOOD  Result Value Ref Range Status   Specimen Description   Final    BLOOD BLOOD RIGHT ARM Performed at [facility], [address], [place], [place] [address]    Special Requests   Final    BOTTLES DRAWN AEROBIC AND ANAEROBIC Blood Culture adequate volume Performed at [facility], [address] [place], [place] [address]    Culture  Setup Time   Final    IN BOTH AEROBIC AND ANAEROBIC BOTTLES GRAM POSITIVE COCCI CRITICAL RESULT CALLED TO, READ BACK BY AND VERIFIED WITH: SCOTT HALL @0019  08/03/19 AKT    Culture (A)  Final    STAPHYLOCOCCUS AUREUS SUSCEPTIBILITIES PERFORMED ON PREVIOUS CULTURE WITHIN THE LAST 5 DAYS. Performed at [facility], [place] [address], [place], [place] [address]    Report Status 08/05/2019 FINAL  Final  Blood Culture ID Panel (Reflexed)     Status: Abnormal   Collection Time: 08/02/19 11:20 AM  Result Value Ref Range Status   Enterococcus species NOT DETECTED NOT DETECTED Final   Listeria monocytogenes NOT DETECTED NOT DETECTED Final   Staphylococcus species DETECTED (A) NOT DETECTED Final    Comment: CRITICAL RESULT CALLED TO, READ BACK BY AND VERIFIED WITH: SCOTT HALL @0019  08/03/19 AKT    Staphylococcus aureus (BCID) DETECTED (A) NOT DETECTED Final    Comment: Methicillin (oxacillin)-resistant Staphylococcus aureus (MRSA). MRSA is predictably resistant to beta-lactam antibiotics (except ceftaroline). Preferred therapy is vancomycin unless clinically contraindicated. Patient requires contact precautions if  hospitalized. CRITICAL RESULT CALLED TO, READ BACK BY AND VERIFIED WITH: SCOTT HALL @0019  08/03/19 AKT    Methicillin resistance DETECTED (A) NOT DETECTED Final    Comment: CRITICAL RESULT CALLED TO, READ BACK BY AND VERIFIED WITH: SCOTT HALL  @0019  08/03/19 AKT    Streptococcus species NOT DETECTED NOT DETECTED Final   Streptococcus agalactiae NOT DETECTED NOT DETECTED Final   Streptococcus pneumoniae NOT DETECTED NOT DETECTED Final   Streptococcus pyogenes NOT DETECTED NOT DETECTED Final   Acinetobacter baumannii NOT DETECTED NOT DETECTED Final   Enterobacteriaceae species NOT DETECTED NOT DETECTED Final   Enterobacter cloacae complex NOT DETECTED NOT DETECTED Final   Escherichia coli NOT DETECTED NOT DETECTED Final   Klebsiella oxytoca NOT DETECTED NOT DETECTED Final   Klebsiella pneumoniae NOT DETECTED NOT DETECTED Final   Proteus species NOT DETECTED NOT DETECTED Final   Serratia marcescens NOT DETECTED NOT DETECTED Final   Haemophilus influenzae NOT DETECTED NOT DETECTED Final   Neisseria meningitidis NOT DETECTED NOT DETECTED Final   Pseudomonas aeruginosa NOT DETECTED NOT DETECTED Final   Candida albicans NOT DETECTED NOT DETECTED Final   Candida glabrata NOT DETECTED NOT DETECTED Final   Candida krusei NOT DETECTED NOT DETECTED Final   Candida parapsilosis NOT DETECTED NOT DETECTED Final   Candida tropicalis NOT DETECTED NOT DETECTED Final    Comment: Performed at [facility], [address] [place], [place] [address]  Aerobic/Anaerobic Culture (surgical/deep wound)     Status: None   Collection Time: 08/02/19  5:29 PM   Specimen: Abscess  Result Value Ref Range Status   Specimen Description   Final    ABSCESS Performed at [facility], [address], [place], [place] [address]    Special Requests ILIACUS MUSCLE  Final  Gram Stain   Final    MODERATE WBC PRESENT,BOTH PMN AND MONONUCLEAR ABUNDANT GRAM POSITIVE COCCI IN PAIRS IN CLUSTERS    Culture   Final    ABUNDANT METHICILLIN RESISTANT STAPHYLOCOCCUS AUREUS NO ANAEROBES ISOLATED Performed at [facility], [address] [place], [place] [address]    Report Status 08/07/2019 FINAL  Final   Organism ID, Bacteria METHICILLIN  RESISTANT STAPHYLOCOCCUS AUREUS  Final      Susceptibility   Methicillin resistant staphylococcus aureus - MIC*    CIPROFLOXACIN >=8 RESISTANT Resistant     ERYTHROMYCIN >=8 RESISTANT Resistant     GENTAMICIN <=0.5 SENSITIVE Sensitive     OXACILLIN >=4 RESISTANT Resistant     TETRACYCLINE <=1 SENSITIVE Sensitive     VANCOMYCIN 1 SENSITIVE Sensitive     TRIMETH/SULFA <=10 SENSITIVE Sensitive     CLINDAMYCIN <=0.25 SENSITIVE Sensitive     RIFAMPIN <=0.5 SENSITIVE Sensitive     Inducible Clindamycin NEGATIVE Sensitive     * ABUNDANT METHICILLIN RESISTANT STAPHYLOCOCCUS AUREUS  MRSA PCR Screening     Status: Abnormal   Collection Time: 08/02/19  8:24 PM   Specimen: Nasal Mucosa; Nasopharyngeal  Result Value Ref Range Status   MRSA by PCR POSITIVE (A) NEGATIVE Final    Comment:        The GeneXpert MRSA Assay (FDA approved for NASAL specimens only), is one component of a comprehensive MRSA colonization surveillance program. It is not intended to diagnose MRSA infection nor to guide or monitor treatment for MRSA infections. RESULT CALLED TO, READ BACK BY AND VERIFIED WITH: Gypsy Lore 08/02/19 @ 2143  [place] Performed at [facility], [address] [place], [place] [address]   CULTURE, BLOOD (ROUTINE X 2) w Reflex to ID Panel     Status: Abnormal   Collection Time: 08/03/19 10:17 AM   Specimen: BLOOD  Result Value Ref Range Status   Specimen Description   Final    BLOOD LEFT ANTECUBITAL Performed at [facility], [address] [place], [place] [address]    Special Requests   Final    BOTTLES DRAWN AEROBIC AND ANAEROBIC Blood Culture adequate volume Performed at [facility], [address], [place], [place] [address]    Culture  Setup Time   Final    GRAM POSITIVE COCCI IN BOTH AEROBIC AND ANAEROBIC BOTTLES CRITICAL VALUE NOTED.  VALUE IS CONSISTENT WITH PREVIOUSLY REPORTED AND CALLED VALUE. Performed at [facility], [address] [place], [place] [address]    Culture (A)  Final    STAPHYLOCOCCUS AUREUS SUSCEPTIBILITIES PERFORMED ON PREVIOUS CULTURE WITHIN THE LAST 5 DAYS. Performed at [facility], [place] [address] [place], [place] [address]    Report Status 08/06/2019 FINAL  Final  CULTURE, BLOOD (ROUTINE X 2) w Reflex to ID Panel     Status: Abnormal   Collection Time: 08/03/19 10:32 AM   Specimen: BLOOD  Result Value Ref Range Status   Specimen Description   Final    BLOOD BLOOD LEFT HAND Performed at [facility], [address], [place], [place] [address]    Special Requests   Final    BOTTLES DRAWN AEROBIC AND ANAEROBIC Blood Culture adequate volume Performed at [facility], [address], [place],  [address]    Culture  Setup Time   Final    GRAM POSITIVE COCCI IN BOTH AEROBIC AND ANAEROBIC BOTTLES CRITICAL RESULT CALLED TO, READ BACK BY AND VERIFIED WITH:  Hart Robinsons [facility] [id number] 08/04/19 HNM Performed at [facility], [place], [place] [address]    Culture (A)  Final    STAPHYLOCOCCUS AUREUS SUSCEPTIBILITIES PERFORMED ON PREVIOUS CULTURE WITHIN THE LAST 5 DAYS. Performed at [facility], [place] [address], [place], [place] [address]    Report Status 08/06/2019 FINAL  Final  Culture, blood (Routine X 2) w Reflex to ID Panel     Status: None (Preliminary result)   Collection Time: 08/05/19  7:18 AM   Specimen: BLOOD  Result Value Ref Range Status   Specimen Description BLOOD LEFT ANTECUBITAL  Final   Special Requests   Final    BOTTLES DRAWN AEROBIC AND ANAEROBIC Blood Culture adequate volume   Culture   Final    NO GROWTH 3 DAYS Performed at [facility], [place] [address] [place], [place] [address]    Report Status PENDING  Incomplete  Culture, blood (Routine X 2) w Reflex to ID Panel     Status: None (Preliminary result)   Collection Time: 08/05/19  7:19 AM   Specimen: BLOOD LEFT HAND  Result Value Ref Range Status    Specimen Description BLOOD LEFT HAND  Final   Special Requests   Final    BOTTLES DRAWN AEROBIC AND ANAEROBIC Blood Culture adequate volume   Culture   Final    NO GROWTH 3 DAYS Performed at [facility], [place] [address] [place],  [address]    Report Status PENDING  Incomplete      Radiology Studies: CT SOFT TISSUE NECK W CONTRAST  Result Date: 08/06/2019 CLINICAL DATA:  Infection, soft tissue infection, abscess; soft tissue mass, neck, no prior imaging. Additional history provided: Swelling, pain, redness to right side of body where the neck meets the shoulder. EXAM: CT NECK WITH CONTRAST TECHNIQUE: Multidetector CT imaging of the neck was performed using the standard protocol following the bolus administration of intravenous contrast. CONTRAST:  68mL OMNIPAQUE IOHEXOL 350 MG/ML SOLN COMPARISON:  CT angiogram chest 08/02/2019 FINDINGS: Pharynx and larynx: No appreciable mass or swelling within the oral cavity, pharynx or larynx. Salivary glands: No inflammation, mass, or stone. Thyroid: Unremarkable thyroid gland. Lymph nodes: No pathologically enlarged cervical chain lymph nodes. Vascular: The major vascular structures of the neck appear patent. Limited intracranial: No acute intracranial abnormality identified Visualized orbits: Visualized orbits demonstrate no acute abnormality. Mastoids and visualized paranasal sinuses: The bilateral maxillary sinuses demonstrate mild mucosal thickening and reactive osteitis. No significant mastoid effusion. Skeleton: No acute bony abnormality or aggressive osseous lesion. C5-C6 disc bulge. Upper chest: New from prior CTA chest 08/02/2019, there are numerous peripheral predominant pulmonary nodules within the imaged lung apices suspicious for septic emboli. There is a loculated collection within the right pleural space along the lateral aspect of the right lung apex measuring 8.5 x 2.7 cm in transaxial dimensions. There is a punctate focus of gas  within this collection. Findings likely reflect empyema. In retrospect, this finding was present on prior examination 08/02/2019, but has increased in size since this prior study. No definite erosion of the adjacent ribs. There is extension of low-density edema into the adjacent right pectoralis musculature measuring 3.9 x 2.2 cm in transaxial dimensions (for instance as seen series 3, image 104). Associated inflammatory stranding within the right retroclavicular region and lower right neck. Partially visualized left IJ approach central venous catheter. These results will be called to the ordering clinician or representative by the Radiologist Assistant, and  communication documented in the PACS or zVision Dashboard. IMPRESSION: 1. Multiple peripheral predominant nodules within the imaged lung apices, new as compared to prior examination 08/02/2019 and highly suspicious for septic emboli. 2. Right pleural collection containing a punctate focus of gas along the lateral aspect of the right lung apex likely reflecting empyema. Extension of low attenuation edema into the adjacent right pectoralis musculature. No peripheral enhancement is demonstrated at this site on the current examination to suggest formed abscess. Associated inflammatory stranding within the right retroclavicular region and right lower neck consistent with soft tissue infection. Dedicated chest CT is recommended for further evaluation. Electronically Signed   By: Kellie Simmering DO   On: 08/06/2019 18:39   [place] EKG SITE RITE  Result Date: 08/07/2019 If Site Rite image not attached, placement could not be confirmed due to current cardiac rhythm.  [place] EKG SITE RITE  Result Date: 08/07/2019 If Site Rite image not attached, placement could not be confirmed due to current cardiac rhythm.    Scheduled Meds: . Chlorhexidine Gluconate Cloth  6 each Topical Daily  . docusate sodium  100 mg Oral BID  . enoxaparin (LOVENOX) injection  40 mg Subcutaneous  Q24H  . LORazepam  1 mg Intravenous Once  . polyethylene glycol  17 g Oral Daily  . sodium chloride flush  10-40 mL Intracatheter Q12H  . traZODone  150 mg Oral QHS   Continuous Infusions: . sodium chloride Stopped (08/08/19 1206)  . vancomycin 1,500 mg (08/08/19 0918)     LOS: 5 days   Time Spent in minutes   45 minutes  Hilary Milks D.O. on 08/08/2019 at 1:35 PM  Between 7am to 7pm - Please see pager noted on [email or web address]  After 7pm go to [email or web address]  And look for the night coverage person covering for me after hours  [facility] Office  [phone]

## 2019-08-08 NOTE — Consult Note (Addendum)
Leslie Molina       Leslie Molina,Leslie Molina [id number]             [phone]        Leslie Molina Medical Record [id number] Date of Birth: [birth year]  Referring: No ref. provider found [facility]: [facility] [place] Primary Cardiologist:No primary care provider on file.  Chief Complaint:  Fevers  History of Present Illness:     Ms. Leslie Molina is a 34 year old female patient with a past medical history significant for bipolar disorder, anxiety, current smoker, history of IV drug abuse, and IUD malpositioning with perforation who presents to [facility] with MRSA bacteremia. She was originally at [facility] in the ER with a tmax of 102 and sinus tachycardia. She states she has been feeling poorly for a few weeks now.  Gynecology and General Surgery have been consulted for the IUD perforation. She has several abscesses (Iliopsoas, gluteal, right dorsum of the hand, right sternoclavicular joint) and one was drained in the ED at [facility]. She continues to have fevers of  > 102 degrees. On Vancomycin per infectious disease. The patient underwent a neck CT which showed right pleural collection containing punctate focus of gas along the lateral aspect of the right lung apex likely reflecting empyema. CT of the chest was recommended which I ordered today.  Echocardiogram reviewed and does not have evidence of endocarditis.   The patient has two children a 12 year old and a 29 year old. She would like to have one more child if possible. She is willing to try anything including surgery to help her feel better.       Current Activity/ Functional Status: Patient was independent with mobility/ambulation, transfers, ADL's, IADL's.   Zubrod Score: At the time of surgery this patient's most appropriate activity status/level should be described as: []     0    Normal activity, no symptoms [x]     1    Restricted in physical strenuous activity but  ambulatory, able to do out light work []     2    Ambulatory and capable of self care, unable to do work activities, up and about                 more than 50%  Of the time                            []     3    Only limited self care, in bed greater than 50% of waking hours []     4    Completely disabled, no self care, confined to bed or chair []     5    Moribund  Past Medical History:  Diagnosis Date  . Anxiety   . Bipolar 1 disorder ([place])   . Depression   . Migraine   . Nerve damage     Past Surgical History:  Procedure Laterality Date  . CESAREAN SECTION      Social History   Tobacco Use  Smoking Status Current Every Day Smoker  . Packs/day: 1.00  . Types: Cigarettes  Smokeless Tobacco Never Used    Social History   Substance and Sexual Activity  Alcohol Use No  . Alcohol/week: 0.0 standard drinks     No Known Allergies  Current Facility-Administered Medications  Medication Dose Route Frequency Provider Last Rate Last Admin  . 0.9 %  sodium chloride infusion   Intravenous PRN Mileya, [place], DO   Stopped at 08/08/19 1206  . acetaminophen (TYLENOL) tablet 650 mg  650 mg Oral Q6H PRN Audria Nine, DO   650 mg at 08/08/19 0552  . Chlorhexidine Gluconate Cloth 2 % PADS 6 each  6 each Topical Daily Audria Nine, DO   6 each at 08/08/19 1133  . docusate sodium (COLACE) capsule 100 mg  100 mg Oral BID Audria Nine, DO   100 mg at 08/08/19 0901  . enoxaparin (LOVENOX) injection 40 mg  40 mg Subcutaneous Q24H Audria Nine, DO   40 mg at 08/07/19 2159  . HYDROmorphone (DILAUDID) injection 1 mg  1 mg Intravenous Q3H PRN Stephanee, Sclafani, DO   1 mg at 08/08/19 1318  . ibuprofen (ADVIL) tablet 400 mg  400 mg Oral Q8H PRN Erick Colace, NP   400 mg at 08/08/19 1133  . LORazepam (ATIVAN) injection 1 mg  1 mg Intravenous Once Farrel, Sisemore, DO      . ondansetron [facility]) injection 4 mg  4 mg Intravenous Q6H PRN Audria Nine, DO      . oxyCODONE (Oxy  IR/ROXICODONE) immediate release tablet 5-10 mg  5-10 mg Oral Q4H PRN Audria Nine, DO   10 mg at 08/08/19 1115  . polyethylene glycol (MIRALAX / GLYCOLAX) packet 17 g  17 g Oral Daily Audria Nine, DO   17 g at 08/08/19 0901  . sodium chloride flush (NS) 0.9 % injection 10-40 mL  10-40 mL Intracatheter Q12H Audria Nine, DO   10 mL at 08/08/19 1134  . sodium chloride flush (NS) 0.9 % injection 10-40 mL  10-40 mL Intracatheter PRN Audria Nine, DO      . sodium chloride flush (NS) 0.9 % injection 10-40 mL  10-40 mL Intracatheter PRN Cristal Ford, DO      . traZODone (DESYREL) tablet 150 mg  150 mg Oral QHS Audria Nine, DO   150 mg at 08/07/19 2152  . vancomycin (VANCOREADY) IVPB 1500 mg/300 mL  1,500 mg Intravenous Q8H Mikhail, [place], DO 150 mL/hr at 08/08/19 0918 1,500 mg at 08/08/19 [id number]    Medications Prior to Admission  Medication Sig Dispense Refill Last Dose  . acetaminophen (TYLENOL) 325 MG tablet Take 2 tablets (650 mg total) by mouth every 6 (six) hours as needed for fever.   Past Week at Unknown time  . ibuprofen (ADVIL) 200 MG tablet Take 200 mg by mouth every 6 (six) hours as needed for moderate pain.   Past Week at Unknown time  . [EXPIRED] acetaminophen (OFIRMEV) 10 MG/ML SOLN Inject 100 mLs (1,000 mg total) into the vein once for 1 dose. (Patient not taking: Reported on 08/03/2019) 100 mL 0 Not Taking at Unknown time  . [EXPIRED] albumin human 25 % bottle Inject 100 mLs (25 g total) into the vein once for 1 dose. (Patient not taking: Reported on 08/03/2019) 50 mL  Not Taking at Unknown time  . ceFEPIme 2 g in sodium chloride 0.9 % 100 mL Inject 2 g into the vein every 8 (eight) hours. (Patient not taking: Reported on 08/03/2019)   Not Taking at Unknown time  . Chlorhexidine Gluconate Cloth 2 % PADS Apply 6 each topically daily. (Patient not taking: Reported on 08/03/2019)   Not Taking at Unknown time  . clindamycin (CLEOCIN) 900 MG/50ML IVPB Inject 50 mLs (900  mg total) into the vein every 8 (eight) hours. (Patient not taking: Reported on 08/03/2019) 50 mL  Not Taking at Unknown time  . [EXPIRED] DAPTOmycin 500 mg in sodium chloride 0.9 % 100 mL Inject 500 mg into the vein now for 1 dose. (Patient not taking: Reported on 08/03/2019)   Not Taking at Unknown time  . Dextrose-Sodium Chloride (DEXTROSE 5 % AND 0.45% NACL) infusion Inject 150 mL/hr into the vein continuous. (Patient not taking: Reported on 08/03/2019)   Not Taking at Unknown time  . enoxaparin (LOVENOX) 40 MG/0.4ML injection Inject 0.4 mLs (40 mg total) into the skin daily. (Patient not taking: Reported on 08/03/2019) 0 mL  Not Taking at Unknown time  . norepinephrine (LEVOPHED) 4-5 MG/250ML-% SOLN Inject 0-40 mcg/min into the vein continuous. (Patient not taking: Reported on 08/03/2019)   Not Taking at Unknown time  . oxyCODONE (OXY IR/ROXICODONE) 5 MG immediate release tablet Take 1 tablet (5 mg total) by mouth every 6 (six) hours as needed for breakthrough pain. (Patient not taking: Reported on 08/03/2019) 30 tablet 0 Not Taking at Unknown time  . potassium chloride 10 MEQ/100ML Inject 100 mLs (10 mEq total) into the vein every 1 hour x 6 doses. (Patient not taking: Reported on 08/03/2019)   Not Taking at Unknown time  . [EXPIRED] vancomycin (VANCOREADY) 500 MG/100ML IVPB Inject 100 mLs (500 mg total) into the vein once for 1 dose. (Patient not taking: Reported on 08/03/2019) 100 mL 0 Not Taking at Unknown time  . vancomycin (VANCOREADY) 750 MG/150ML SOLN Inject 150 mLs (750 mg total) into the vein every 8 (eight) hours. (Patient not taking: Reported on 08/03/2019)   Not Taking at Unknown time    Family History  Problem Relation Age of Onset  . Depression Mother   . Bipolar disorder Mother   . Depression Father   . Bipolar disorder Father   . Depression Sister   . Bipolar disorder Sister      Review of Systems:   Review of Systems  Constitutional: Positive for chills, fever and malaise/fatigue.  Negative for weight loss.  Cardiovascular: Negative for chest pain and leg swelling.  Gastrointestinal: Negative for abdominal pain, nausea and vomiting.  Musculoskeletal: Positive for back pain.  Psychiatric/Behavioral: Positive for depression and substance abuse. The patient is nervous/anxious.    Pertinent items are noted in HPI.     Physical Exam: BP 102/65 (BP Location: Right Arm)   Pulse (!) 129   Temp (!) 102.9 F (39.4 C) (Oral)   Resp 16   Ht 5\' 1"  (1.549 m)   Wt 73.5 kg   LMP  (LMP Unknown)   SpO2 99%   BMI 30.61 kg/m    General appearance: ill-appearing, alert, cooperative and no distress Resp: clear to auscultation bilaterally Cardio: sinus tachycardia GI: soft, non-tender; bowel sounds normal; no masses,  no organomegaly Extremities: extremities normal, atraumatic, no cyanosis or edema Neurologic: Grossly normal  Diagnostic Studies & Laboratory data:  ECHOCARDIOGRAM LIMITED REPORT  08/03/2019     Patient Name:  FARINA PROBST Date of Exam: 08/03/2019  Medical Rec #: [id number] Height:    61.0 in  Accession #:  [id number] Weight:    145.0 lb  Date of Birth: 1986-02-03 BSA:     1.647 m  Patient Age:  33 years  BP:      87/42 mmHg  Patient Gender: F     HR:      121 bpm.  Exam Location: [facility]   Procedure: 2D Echo, Cardiac Doppler and Color Doppler   Indications:   Endocarditis 138    History:  Patient has no prior history of Echocardiogram  examinations.          Anxiety, bipolar 1 disorder, migraine.    Sonographer:   Sherrie Sport RDCS (AE)  Referring Phys: [place]  Diagnosing Phys: Bartholome Bill MD   IMPRESSIONS    1. No evidence of vegetations. Somewhat tachycardic but able to see  valves fairly well.  2. Left ventricular ejection fraction, by estimation, is 65 to 70%. The  left ventricle has normal function. Left ventricular diastolic parameters  were normal.  3.  Right ventricular systolic function is normal. The right ventricular  size is mildly enlarged. There is moderately elevated pulmonary artery  systolic pressure.  4. Left atrial size was mildly dilated.  5. The mitral valve is grossly normal. Mild mitral valve regurgitation.  6. The aortic valve is grossly normal. Aortic valve regurgitation is  trivial.   Conclusion(s)/Recommendation(s): No evidence of valvular vegetations on  this transthoracic echocardiogram. Would recommend a transesophageal  echocardiogram to exclude infective endocarditis if clinically indicated.   FINDINGS  Left Ventricle: Left ventricular ejection fraction, by estimation, is 65  to 70%. The left ventricle has normal function. The left ventricular  internal cavity size was normal in size. There is no left ventricular  hypertrophy.   Right Ventricle: The right ventricular size is mildly enlarged. No  increase in right ventricular wall thickness. Right ventricular systolic  function is normal. There is moderately elevated pulmonary artery systolic  pressure. The tricuspid regurgitant  velocity is 3.11 m/s, and with an assumed right atrial pressure of 10  mmHg, the estimated right ventricular systolic pressure is [id number] mmHg.   Left Atrium: Left atrial size was mildly dilated.   Pericardium: There is no evidence of pericardial effusion.   Mitral Valve: The mitral valve is grossly normal. Mild mitral valve  regurgitation. There is no evidence of mitral valve vegetation.   Tricuspid Valve: The tricuspid valve is grossly normal. Tricuspid valve  regurgitation is mild.   Aortic Valve: The aortic valve is grossly normal. Aortic valve  regurgitation is trivial. There is no evidence of aortic valve vegetation.   Pulmonic Valve: The pulmonic valve was grossly normal. Pulmonic valve  regurgitation is trivial. There is no evidence of pulmonic valve  vegetation.   Aorta: The aortic root is normal in size and  structure.     LEFT VENTRICLE  PLAX 2D  LVIDd:     4.82 cm  LVIDs:     2.60 cm  LV PW:     1.11 cm  LV IVS:    0.95 cm  LVOT diam:   2.20 cm  LVOT Area:   3.80 cm     LEFT ATRIUM     Index  LA diam:  4.10 cm 2.49 cm/m             PULMONIC VALVE  AORTA         PV Vmax:    1.00 m/s  Ao Root diam: 2.60 cm PV Peak grad:  4.0 mmHg            RVOT Peak grad: 8 mmHg     TRICUSPID VALVE  TR Peak grad:  38.7 mmHg  TR Vmax:    311.00 cm/s    SHUNTS  Systemic Diam: 2.20 cm   Bartholome Bill MD  Electronically signed by Bartholome Bill MD  Signature Date/Time: 08/03/2019/11:20:44 AM      Final      Recent Radiology Findings:  CT SOFT TISSUE NECK W CONTRAST  Result Date: 08/06/2019 CLINICAL DATA:  Infection, soft tissue infection, abscess; soft tissue mass, neck, no prior imaging. Additional history provided: Swelling, pain, redness to right side of body where the neck meets the shoulder. EXAM: CT NECK WITH CONTRAST TECHNIQUE: Multidetector CT imaging of the neck was performed using the standard protocol following the bolus administration of intravenous contrast. CONTRAST:  69mL OMNIPAQUE IOHEXOL 350 MG/ML SOLN COMPARISON:  CT angiogram chest 08/02/2019 FINDINGS: Pharynx and larynx: No appreciable mass or swelling within the oral cavity, pharynx or larynx. Salivary glands: No inflammation, mass, or stone. Thyroid: Unremarkable thyroid gland. Lymph nodes: No pathologically enlarged cervical chain lymph nodes. Vascular: The major vascular structures of the neck appear patent. Limited intracranial: No acute intracranial abnormality identified Visualized orbits: Visualized orbits demonstrate no acute abnormality. Mastoids and visualized paranasal sinuses: The bilateral maxillary sinuses demonstrate mild mucosal thickening and reactive osteitis. No significant mastoid effusion. Skeleton: No acute bony abnormality or aggressive  osseous lesion. C5-C6 disc bulge. Upper chest: New from prior CTA chest 08/02/2019, there are numerous peripheral predominant pulmonary nodules within the imaged lung apices suspicious for septic emboli. There is a loculated collection within the right pleural space along the lateral aspect of the right lung apex measuring 8.5 x 2.7 cm in transaxial dimensions. There is a punctate focus of gas within this collection. Findings likely reflect empyema. In retrospect, this finding was present on prior examination 08/02/2019, but has increased in size since this prior study. No definite erosion of the adjacent ribs. There is extension of low-density edema into the adjacent right pectoralis musculature measuring 3.9 x 2.2 cm in transaxial dimensions (for instance as seen series 3, image 104). Associated inflammatory stranding within the right retroclavicular region and lower right neck. Partially visualized left IJ approach central venous catheter. These results will be called to the ordering clinician or representative by the Radiologist Assistant, and communication documented in the PACS or zVision Dashboard. IMPRESSION: 1. Multiple peripheral predominant nodules within the imaged lung apices, new as compared to prior examination 08/02/2019 and highly suspicious for septic emboli. 2. Right pleural collection containing a punctate focus of gas along the lateral aspect of the right lung apex likely reflecting empyema. Extension of low attenuation edema into the adjacent right pectoralis musculature. No peripheral enhancement is demonstrated at this site on the current examination to suggest formed abscess. Associated inflammatory stranding within the right retroclavicular region and right lower neck consistent with soft tissue infection. Dedicated chest CT is recommended for further evaluation. Electronically Signed   By: Kellie Simmering DO   On: 08/06/2019 18:39   [place] EKG SITE RITE  Result Date: 08/07/2019 If Site Rite  image not attached, placement could not be confirmed due to current cardiac rhythm.  [place] EKG SITE RITE  Result Date: 08/07/2019 If Site Rite image not attached, placement could not be confirmed due to current cardiac rhythm.    I have independently reviewed the above radiologic studies and discussed with the patient   Recent Lab Findings: Lab Results  Component Value Date   WBC 11.9 (H) 08/08/2019   HGB 10.0 (L) 08/08/2019   HCT 30.8 (L) 08/08/2019   PLT 464 (H) 08/08/2019   GLUCOSE 148 (H) 08/08/2019   ALT 53 (H) 08/08/2019   AST 35 08/08/2019   NA 135 08/08/2019   K 4.1 08/08/2019   CL 102 08/08/2019   CREATININE 0.45 08/08/2019   BUN 7 08/08/2019   CO2 24 08/08/2019  INR 1.3 (H) 08/03/2019      Assessment / Plan:      1. Suspected empyema with in the right apex of the pleural space, adjacent septic emboli, parenchymal abscess as well as infection extending into the chest wall and retroclavicular space. Ordered a CT of the chest for further evaluation. 2. MRSA bacteremia-max temp 102.9, WBC 11.0, ID following and dosing Vanco. Possible source empyema 3. Multiple abscesses (Iliopsoas, gluteal, right dorsum of the hand, right sternoclavicular joint)- ID and ortho consulted.  4. IUD perforation/Pertonitis-general surgery and gyn consulted 5. Bipolar disease/anxiety and depression-continue home medications 6. Hx of IV drug abuse, current everyday smoker. Reports being "clean" for 1 year.  7. COVID 19 negative   Plan: Explained possible VATs with drainage of an empyema with possible decortication to the patient. Also, explained insertion of a pigtail catheter for possible needed for her pleural space. I explained that we will know more information and can give more clear recommendations once her CT of the chest is completed. The patient is willing to do anything to feel better. All questions answered to the patient's satisfaction.    I  spent 25 minutes counseling the patient  face to face.   Nicholes Rough, PA-C 08/08/2019 2:12 PM    Agree with above This is a 34 year old female with history of IV drug use was admitted with MRSA bacteremia, and multiple abscesses, IUD perforation with peritonitis.  We are consulted for evaluation of right sternoclavicular joint abscess and possible empyema.  On discussion with the patient she does have some pain along the right upper chest wall, but no significant pain at her sternoclavicular joint.  She does complain of some pleuritic chest pain.  On review of her imaging she does have some stranding underneath her right clavicle but it is hard to identify a specific abscess.  There does appear to be fluid collection in her pleural space that may have necessitated down from her chest.  Additionally she has parenchymal changes to both lungs concerning for septic emboli.  Her echocardiogram was negative for endocarditis but due to concern do recommend she undergo a TEE.  In regards to her chest wall and pleural process, this creates a challenge.  Given that there is no obvious fluid collection in her chest wall on physical exam I recommend that she undergo a CT-guided drain placement for management of the pleural process.  If her physical exam changes she develops an abscess chest wall that she will need to undergo incision and drainage.  Do not feel the need to do that right now.  I have placed the order for the CT-guided drain and will follow along peripherally.

## 2019-08-08 NOTE — Progress Notes (Signed)
[facility] for Infectious Disease  Date of Admission:  08/03/2019     Total days of antibiotics 7         ASSESSMENT:  Ms. Waldon continues to remain febrile with source control remaining a concern. Awaiting CVTS evaluation. Blood cultures from 3/6 remain without growth to date. PICC line placed today. Right hand 3rd MCP joint also continues to improve. Renal function without evidence of nephrotoxicity. Continue current dose of vancomycin. Duration and end date to be determined once source control is achieved.   PLAN:  1. Continue vancomycin. 2. Monitor renal function for nephrotoxicity.  3. Await evaluation by CVTS for possible surgical intervention. 4. 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 Chlorhexidine Gluconate Cloth  6 each Topical Daily  . docusate sodium  100 mg Oral BID  . enoxaparin (LOVENOX) injection  40 mg Subcutaneous Q24H  . LORazepam  1 mg Intravenous Once  . polyethylene glycol  17 g Oral Daily  . sodium chloride flush  10-40 mL Intracatheter Q12H  . traZODone  150 mg Oral QHS    SUBJECTIVE:  Continues to have intermittent fevers with max temperature of 103 overnight. WBC count stable at 11.9. Having pain right hip pain. Awaiting CVTS consult.   No Known Allergies   Review of Systems: Review of Systems  Constitutional: Positive for chills, fever and malaise/fatigue. Negative for weight loss.  Respiratory: Negative for cough, shortness of breath and wheezing.   Cardiovascular: Negative for chest pain and leg swelling.  Gastrointestinal: Negative for abdominal pain, constipation, diarrhea, nausea and vomiting.  Musculoskeletal:       Positive for hip pain.   Skin: Negative for rash.      OBJECTIVE: Vitals:   08/07/19 1954 08/07/19 2151 08/08/19 0424 08/08/19 0900  BP:  113/76  97/63 102/65  Pulse:  (!) 116    Resp: 18  16   Temp: (!) 103 F (39.4 C) 98.9 F (37.2 C) 98.9 F (37.2 C) 99.5 F (37.5 C)  TempSrc: Oral Oral Oral Oral  SpO2: 95%     Weight:      Height:       Body mass index is 30.61 kg/m.  Physical Exam Constitutional:      General: She is not in acute distress.    Appearance: She is well-developed.     Comments: Lying in bed with head of bed elevated; pleasant.   Cardiovascular:     Rate and Rhythm: Normal rate and regular rhythm.     Heart sounds: Normal heart sounds.  Pulmonary:     Effort: Pulmonary effort is normal.     Breath sounds: Normal breath sounds.  Musculoskeletal:     Comments: Right 3rd MCP with less edema and redness compared to previous.   Skin:    General: Skin is warm and dry.  Neurological:     Mental Status: She is alert and oriented to person, place, and time.     Lab Results Lab Results  Component Value Date   WBC 11.9 (H) 08/08/2019   HGB 10.0 (L) 08/08/2019   HCT 30.8 (L) 08/08/2019   MCV 86.5 08/08/2019   PLT 464 (H) 08/08/2019    Lab Results  Component Value Date   CREATININE 0.45 08/08/2019   BUN  7 08/08/2019   NA 135 08/08/2019   K 4.1 08/08/2019   CL 102 08/08/2019   CO2 24 08/08/2019    Lab Results  Component Value Date   ALT 53 (H) 08/08/2019   AST 35 08/08/2019   ALKPHOS 122 08/08/2019   BILITOT 0.3 08/08/2019     Microbiology: Recent Results (from the past 240 hour(s))  Respiratory Panel by RT PCR (Flu A&B, Covid) - Nasopharyngeal Swab     Status: None   Collection Time: 08/02/19 11:20 AM   Specimen: Nasopharyngeal Swab  Result Value Ref Range Status   SARS Coronavirus 2 by RT PCR NEGATIVE NEGATIVE Final    Comment: (NOTE) SARS-CoV-2 target nucleic acids are NOT DETECTED. The SARS-CoV-2 RNA is generally detectable in upper respiratoy specimens during the acute phase of infection. The lowest concentration of SARS-CoV-2 viral copies this assay can detect is 131  copies/mL. A negative result does not preclude SARS-Cov-2 infection and should not be used as the sole basis for treatment or other patient management decisions. A negative result may occur with  improper specimen collection/handling, submission of specimen other than nasopharyngeal swab, presence of viral mutation(s) within the areas targeted by this assay, and inadequate number of viral copies (<131 copies/mL). A negative result must be combined with clinical observations, patient history, and epidemiological information. The expected result is Negative. Fact Sheet for Patients:  PinkCheek.be Fact Sheet for Healthcare Providers:  GravelBags.it This test is not yet ap proved or cleared by the [place] FDA and  has been authorized for detection and/or diagnosis of SARS-CoV-2 by FDA under an Emergency Use Authorization (EUA). This EUA will remain  in effect (meaning this test can be used) for the duration of the COVID-19 declaration under Section 564(b)(1) of the Act, 21 U.S.C. section 360bbb-3(b)(1), unless the authorization is terminated or revoked sooner.    Influenza A by PCR NEGATIVE NEGATIVE Final   Influenza B by PCR NEGATIVE NEGATIVE Final    Comment: (NOTE) The Xpert Xpress SARS-CoV-2/FLU/RSV assay is intended as an aid in  the diagnosis of influenza from Nasopharyngeal swab specimens and  should not be used as a sole basis for treatment. Nasal washings and  aspirates are unacceptable for Xpert Xpress SARS-CoV-2/FLU/RSV  testing. Fact Sheet for Patients: PinkCheek.be Fact Sheet for Healthcare Providers: GravelBags.it This test is not yet approved or cleared by the [place] FDA and  has been authorized for detection and/or diagnosis of SARS-CoV-2 by  FDA under an Emergency Use Authorization (EUA). This EUA will remain  in effect (meaning this test can  be used) for the duration of the  Covid-19 declaration under Section 564(b)(1) of the Act, 21  U.S.C. section 360bbb-3(b)(1), unless the authorization is  terminated or revoked. Performed at [facility], [address] [place], [place] [address]   Blood culture (routine x 2)     Status: Abnormal   Collection Time: 08/02/19 11:20 AM   Specimen: BLOOD  Result Value Ref Range Status   Specimen Description   Final    BLOOD RIGHT ANTECUBITAL Performed at [facility], [address] [place], [place] [address]    Special Requests   Final    BOTTLES DRAWN AEROBIC AND ANAEROBIC Blood Culture adequate volume Performed at [facility], [address] [place], [place] [address]    Culture  Setup Time   Final    IN BOTH AEROBIC AND ANAEROBIC BOTTLES GRAM POSITIVE COCCI CRITICAL RESULT CALLED TO, READ BACK BY AND  VERIFIED WITH: SCOTT HALL @2022  08/02/19 [place] Performed at [facility], [address] [place], [place] [address]    Culture METHICILLIN RESISTANT STAPHYLOCOCCUS AUREUS (A)  Final   Report Status 08/05/2019 FINAL  Final   Organism ID, Bacteria METHICILLIN RESISTANT STAPHYLOCOCCUS AUREUS  Final      Susceptibility   Methicillin resistant staphylococcus aureus - MIC*    CIPROFLOXACIN >=8 RESISTANT Resistant     ERYTHROMYCIN >=8 RESISTANT Resistant     GENTAMICIN <=0.5 SENSITIVE Sensitive     OXACILLIN >=4 RESISTANT Resistant     TETRACYCLINE <=1 SENSITIVE Sensitive     VANCOMYCIN <=0.5 SENSITIVE Sensitive     TRIMETH/SULFA <=10 SENSITIVE Sensitive     CLINDAMYCIN <=0.25 SENSITIVE Sensitive     RIFAMPIN <=0.5 SENSITIVE Sensitive     Inducible Clindamycin NEGATIVE Sensitive     * METHICILLIN RESISTANT STAPHYLOCOCCUS AUREUS  Blood culture (routine x 2)     Status: Abnormal   Collection Time: 08/02/19 11:20 AM   Specimen: BLOOD  Result Value Ref Range Status   Specimen Description   Final    BLOOD BLOOD RIGHT ARM Performed at [facility], [address], [place], [place] [address]    Special Requests   Final    BOTTLES DRAWN AEROBIC AND ANAEROBIC Blood Culture adequate volume Performed at [facility], [address] [place], [place] [address]    Culture  Setup Time   Final    IN BOTH AEROBIC AND ANAEROBIC BOTTLES GRAM POSITIVE COCCI CRITICAL RESULT CALLED TO, READ BACK BY AND VERIFIED WITH: SCOTT HALL @0019  08/03/19 [place]    Culture (A)  Final    STAPHYLOCOCCUS AUREUS SUSCEPTIBILITIES PERFORMED ON PREVIOUS CULTURE WITHIN THE LAST 5 DAYS. Performed at [facility], [place] [address] [place], [place] [address]    Report Status 08/05/2019 FINAL  Final  Blood Culture ID Panel (Reflexed)     Status: Abnormal   Collection Time: 08/02/19 11:20 AM  Result Value Ref Range Status   Enterococcus species NOT DETECTED NOT DETECTED Final   Listeria monocytogenes NOT DETECTED NOT DETECTED Final   Staphylococcus species DETECTED (A) NOT DETECTED Final    Comment: CRITICAL RESULT CALLED TO, READ BACK BY AND VERIFIED WITH: SCOTT HALL @0019  08/03/19 [facility]    Staphylococcus aureus (BCID) DETECTED (A) NOT DETECTED Final    Comment: Methicillin (oxacillin)-resistant Staphylococcus aureus (MRSA). MRSA is predictably resistant to beta-lactam antibiotics (except ceftaroline). Preferred therapy is vancomycin unless clinically contraindicated. Patient requires contact precautions if  hospitalized. CRITICAL RESULT CALLED TO, READ BACK BY AND VERIFIED WITH: SCOTT HALL @0019  08/03/19 [facility]    Methicillin resistance DETECTED (A) NOT DETECTED Final    Comment: CRITICAL RESULT CALLED TO, READ BACK BY AND VERIFIED WITH: SCOTT HALL @0019  08/03/19 [facility]    Streptococcus species NOT DETECTED NOT DETECTED Final   Streptococcus agalactiae NOT DETECTED NOT DETECTED Final   Streptococcus pneumoniae NOT DETECTED NOT DETECTED Final   Streptococcus pyogenes NOT DETECTED NOT DETECTED Final   Acinetobacter baumannii NOT DETECTED NOT DETECTED  Final   Enterobacteriaceae species NOT DETECTED NOT DETECTED Final   Enterobacter cloacae complex NOT DETECTED NOT DETECTED Final   Escherichia coli NOT DETECTED NOT DETECTED Final   Klebsiella oxytoca NOT DETECTED NOT DETECTED Final   Klebsiella pneumoniae NOT DETECTED NOT DETECTED Final   Proteus species NOT DETECTED NOT DETECTED Final   Serratia marcescens NOT DETECTED NOT DETECTED Final   Haemophilus influenzae NOT DETECTED NOT DETECTED Final  Neisseria meningitidis NOT DETECTED NOT DETECTED Final   Pseudomonas aeruginosa NOT DETECTED NOT DETECTED Final   Candida albicans NOT DETECTED NOT DETECTED Final   Candida glabrata NOT DETECTED NOT DETECTED Final   Candida krusei NOT DETECTED NOT DETECTED Final   Candida parapsilosis NOT DETECTED NOT DETECTED Final   Candida tropicalis NOT DETECTED NOT DETECTED Final    Comment: Performed at [facility], [address] [place], [place] [address]  Aerobic/Anaerobic Culture (surgical/deep wound)     Status: None   Collection Time: 08/02/19  5:29 PM   Specimen: Abscess  Result Value Ref Range Status   Specimen Description   Final    ABSCESS Performed at [facility], [address] [place], [place] [address]    Special Requests ILIACUS MUSCLE  Final   Gram Stain   Final    MODERATE WBC PRESENT,BOTH PMN AND MONONUCLEAR ABUNDANT GRAM POSITIVE COCCI IN PAIRS IN CLUSTERS    Culture   Final    ABUNDANT METHICILLIN RESISTANT STAPHYLOCOCCUS AUREUS NO ANAEROBES ISOLATED Performed at [facility], [place] [address], [place], [place] [address]    Report Status 08/07/2019 FINAL  Final   Organism ID, Bacteria METHICILLIN RESISTANT STAPHYLOCOCCUS AUREUS  Final      Susceptibility   Methicillin resistant staphylococcus aureus - MIC*    CIPROFLOXACIN >=8 RESISTANT Resistant     ERYTHROMYCIN >=8 RESISTANT Resistant     GENTAMICIN <=0.5 SENSITIVE Sensitive     OXACILLIN >=4 RESISTANT Resistant     TETRACYCLINE <=1  SENSITIVE Sensitive     VANCOMYCIN 1 SENSITIVE Sensitive     TRIMETH/SULFA <=10 SENSITIVE Sensitive     CLINDAMYCIN <=0.25 SENSITIVE Sensitive     RIFAMPIN <=0.5 SENSITIVE Sensitive     Inducible Clindamycin NEGATIVE Sensitive     * ABUNDANT METHICILLIN RESISTANT STAPHYLOCOCCUS AUREUS  MRSA PCR Screening     Status: Abnormal   Collection Time: 08/02/19  8:24 PM   Specimen: Nasal Mucosa; Nasopharyngeal  Result Value Ref Range Status   MRSA by PCR POSITIVE (A) NEGATIVE Final    Comment:        The GeneXpert MRSA Assay (FDA approved for NASAL specimens only), is one component of a comprehensive MRSA colonization surveillance program. It is not intended to diagnose MRSA infection nor to guide or monitor treatment for MRSA infections. RESULT CALLED TO, READ BACK BY AND VERIFIED WITH: Gypsy Lore 08/02/19 @ 2143  [place] Performed at [facility], [address] [place], [place] [address]   CULTURE, BLOOD (ROUTINE X 2) w Reflex to ID Panel     Status: Abnormal   Collection Time: 08/03/19 10:17 AM   Specimen: BLOOD  Result Value Ref Range Status   Specimen Description   Final    BLOOD LEFT ANTECUBITAL Performed at [facility], [address] [place], [place] [address]    Special Requests   Final    BOTTLES DRAWN AEROBIC AND ANAEROBIC Blood Culture adequate volume Performed at [facility], [address] [place], [place] [address]    Culture  Setup Time   Final    GRAM POSITIVE COCCI IN BOTH AEROBIC AND ANAEROBIC BOTTLES CRITICAL VALUE NOTED.  VALUE IS CONSISTENT WITH PREVIOUSLY REPORTED AND CALLED VALUE. Performed at [facility], [address] [place], [place] [address]    Culture (A)  Final    STAPHYLOCOCCUS AUREUS SUSCEPTIBILITIES PERFORMED ON PREVIOUS CULTURE WITHIN THE LAST 5 DAYS. Performed at [facility], [address] [place], [place] [address]    Report Status 08/06/2019 FINAL  Final  CULTURE, BLOOD (ROUTINE X 2) w  Reflex to ID Panel     Status: Abnormal   Collection Time: 08/03/19 10:32 AM   Specimen: BLOOD  Result Value Ref Range Status   Specimen Description   Final    BLOOD BLOOD LEFT HAND Performed at [facility], [address] [place], [place] [address]    Special Requests   Final    BOTTLES DRAWN AEROBIC AND ANAEROBIC Blood Culture adequate volume Performed at [facility], [address] [place], [place] [address]    Culture  Setup Time   Final    GRAM POSITIVE COCCI IN BOTH AEROBIC AND ANAEROBIC BOTTLES CRITICAL RESULT CALLED TO, READ BACK BY AND VERIFIED WITH: Hart Robinsons [facility] [id number] 08/04/19 [place] Performed at [facility], [address] [place], [place] [address]    Culture (A)  Final    STAPHYLOCOCCUS AUREUS SUSCEPTIBILITIES PERFORMED ON PREVIOUS CULTURE WITHIN THE LAST 5 DAYS. Performed at [facility], [place] [address], [place], [place] [address]    Report Status 08/06/2019 FINAL  Final  Culture, blood (Routine X 2) w Reflex to ID Panel     Status: None (Preliminary result)   Collection Time: 08/05/19  7:18 AM   Specimen: BLOOD  Result Value Ref Range Status   Specimen Description BLOOD LEFT ANTECUBITAL  Final   Special Requests   Final    BOTTLES DRAWN AEROBIC AND ANAEROBIC Blood Culture adequate volume   Culture   Final    NO GROWTH 2 DAYS Performed at [facility], [place] [address] [place], [place] [address]    Report Status PENDING  Incomplete  Culture, blood (Routine X 2) w Reflex to ID Panel     Status: None (Preliminary result)   Collection Time: 08/05/19  7:19 AM   Specimen: BLOOD LEFT HAND  Result Value Ref Range Status   Specimen Description BLOOD LEFT HAND  Final   Special Requests   Final    BOTTLES DRAWN AEROBIC AND ANAEROBIC Blood Culture adequate volume   Culture   Final    NO GROWTH 2 DAYS Performed at [facility], [place] [address], [place],  [address]    Report Status PENDING  Incomplete      Terri Piedra, [place] for [place] Pager  08/08/2019  11:58 AM

## 2019-08-09 ENCOUNTER — Inpatient Hospital Stay (HOSPITAL_COMMUNITY): Payer: Medicaid Other

## 2019-08-09 DIAGNOSIS — M4628 Osteomyelitis of vertebra, sacral and sacrococcygeal region: Secondary | ICD-10-CM | POA: Diagnosis present

## 2019-08-09 HISTORY — PX: IR THORACENTESIS ASP PLEURAL SPACE W/IMG GUIDE: IMG5380

## 2019-08-09 LAB — VANCOMYCIN, TROUGH: Vancomycin Tr: 11 ug/mL — ABNORMAL LOW (ref 15–20)

## 2019-08-09 LAB — VANCOMYCIN, PEAK: Vancomycin Pk: 30 ug/mL (ref 30–40)

## 2019-08-09 IMAGING — US IR THORACENTESIS ASP PLEURAL SPACE W/IMG GUIDE
1 series · 9 of 9 positions shown · non-contrast
Comparison: none

INDICATION: 33-year-old with a small right chest empyema.

[Series 1: ir rad eval and mgt. · 9 of 9 slices shown]
[im 1/9]
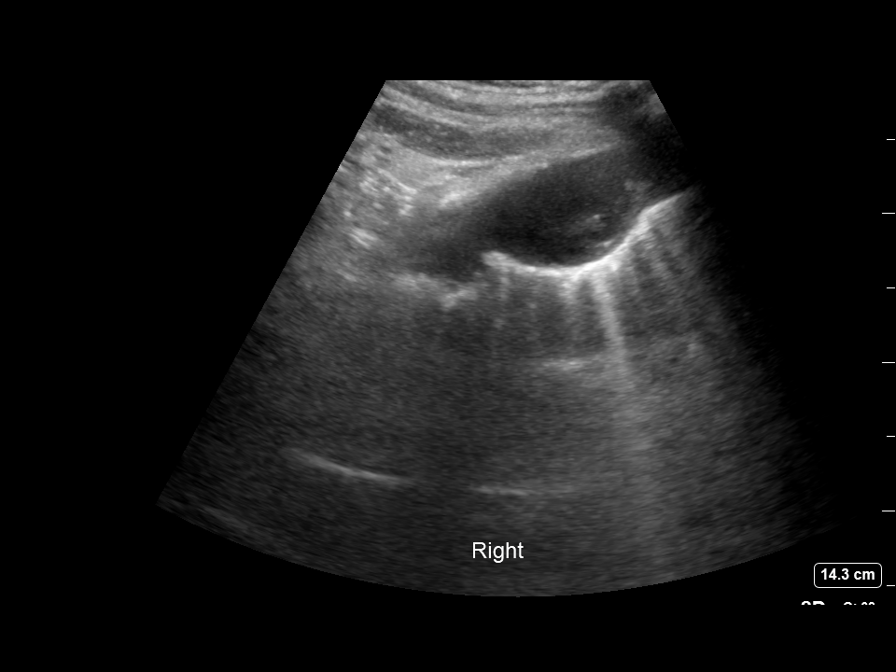
[im 2/9]
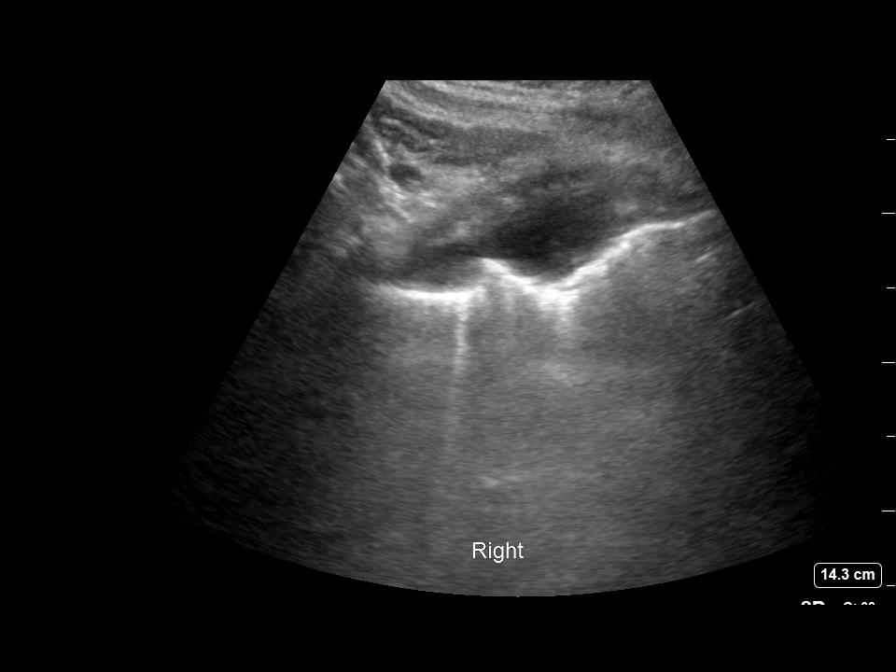
[im 3/9]
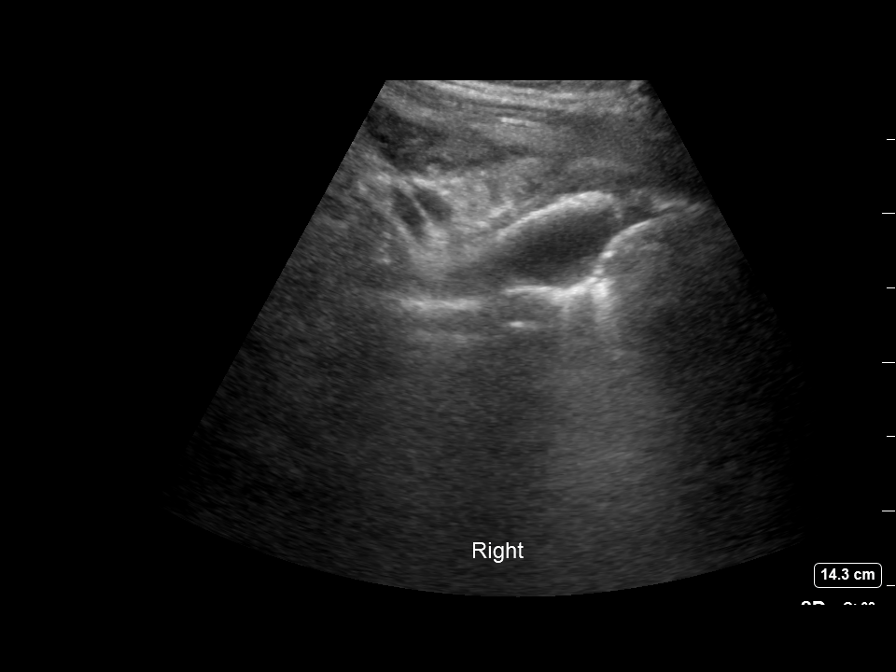
[im 4/9]
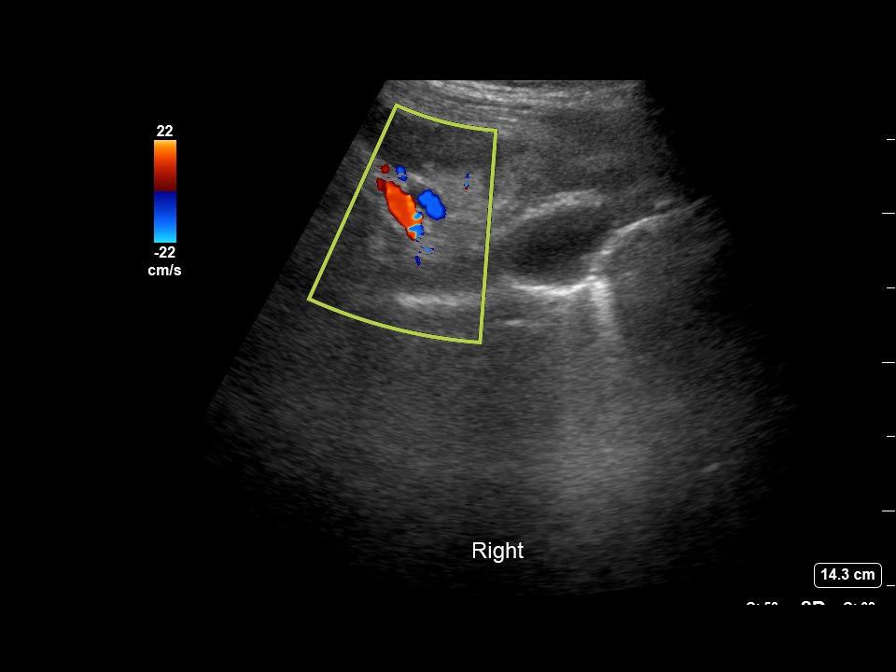
[im 5/9]
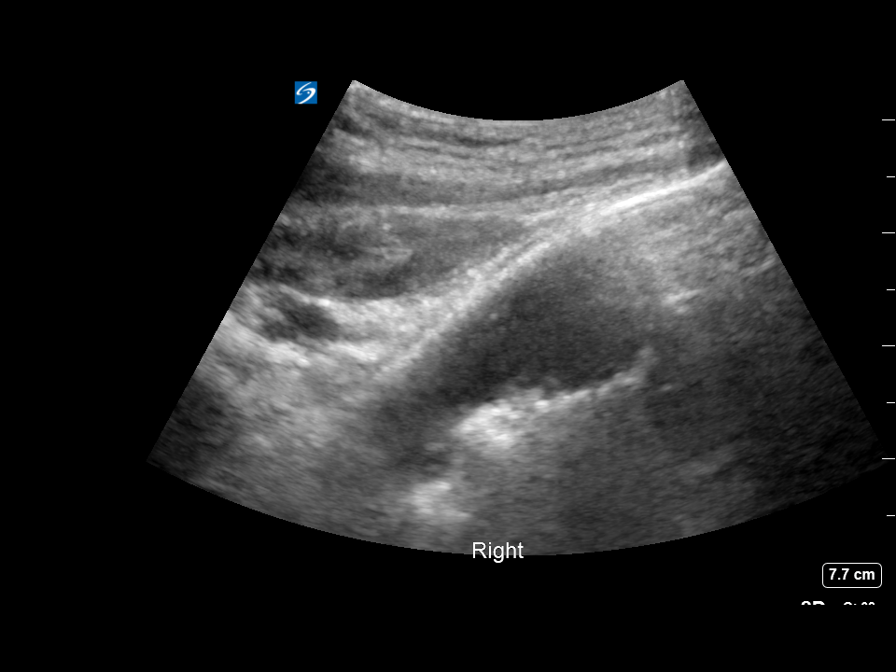
[im 6/9]
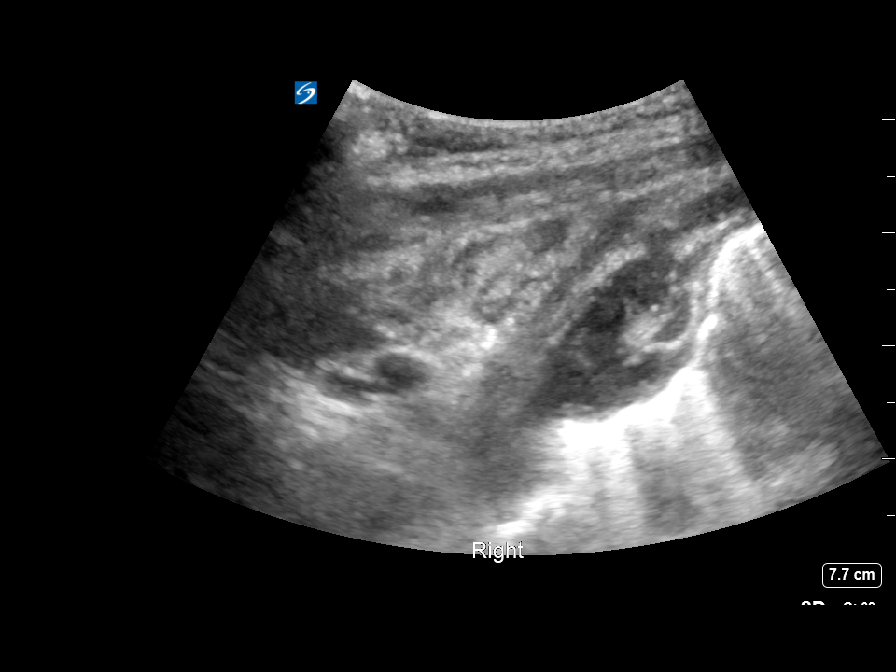
[im 7/9]
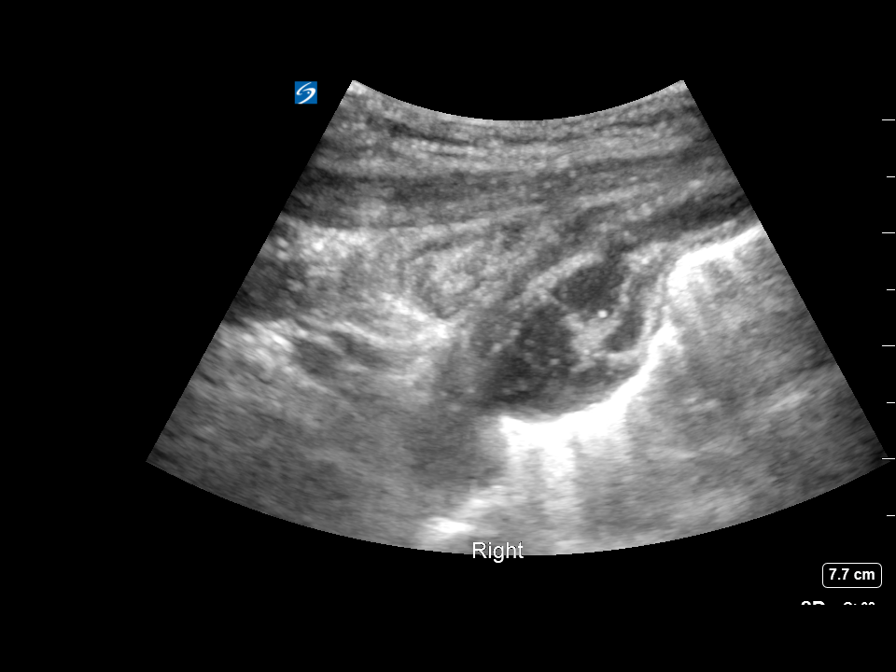
[im 8/9]
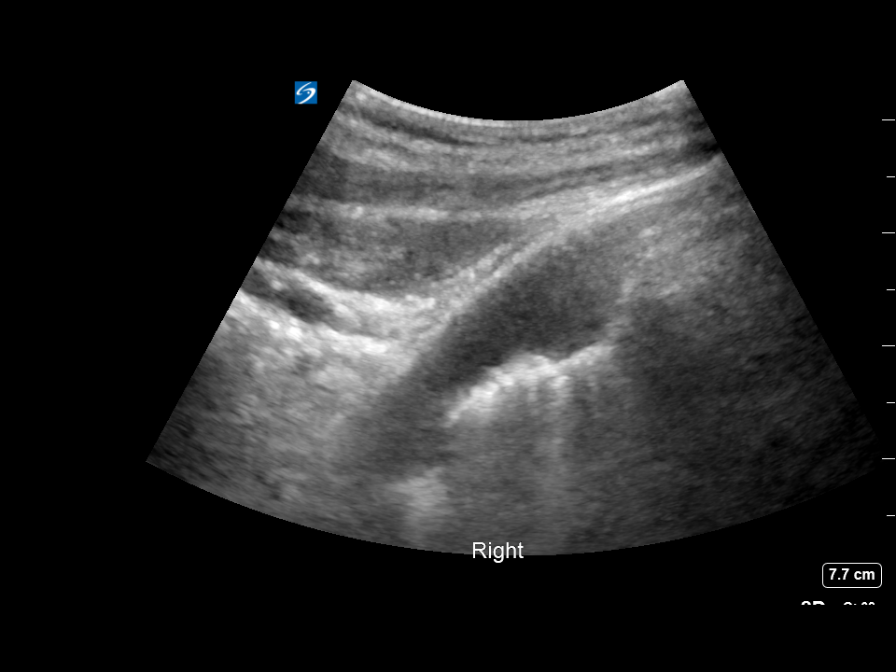
[im 9/9]
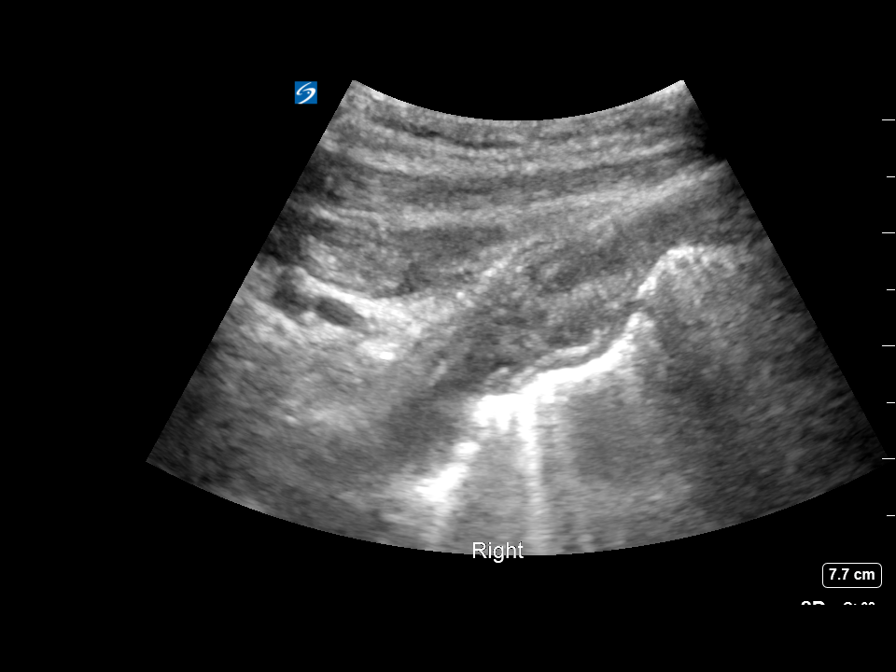

[9 of 9 positions shown; findings below may reference images not displayed]

EXAM:
ULTRASOUND GUIDED RIGHT THORACENTESIS

MEDICATIONS:
None.

COMPLICATIONS:
None immediate.

PROCEDURE:
An ultrasound guided thoracentesis was thoroughly discussed with the
patient and questions answered. The benefits, risks, alternatives
and complications were also discussed. The patient understands and
wishes to proceed with the procedure. Written consent was obtained.

Ultrasound was used to evaluate the right upper chest. Small complex
pleural-based collection was identified in the right upper chest.
Skin was prepped with chlorhexidine and sterile field was created.
Skin was anesthetized with 1% lidocaine. A 19 gauge, 7 cm Yueh
catheter was directed into the small collection under real-time
ultrasound guidance. Approximately 13 mL of thick yellow purulent
fluid was aspirated. Fluid was collected and sent for culture.
Bandage placed over the puncture site.
FINDINGS: Small irregular pleural-based collection in the anterior right upper
chest. This collection corresponds with the small pleural-based
collection on recent CT imaging. Yueh catheter was successfully
placed within the collection and 13 mL of thick yellow purulent
fluid was aspirated. Collection was slightly smaller based on
ultrasound after the fluid had been aspirated.
IMPRESSION: Successful ultrasound guided right thoracentesis yielding 13 mL of
purulent fluid from the small right chest empyema.

## 2019-08-09 IMAGING — CR DG CHEST 1V
1 series · 1 of 1 positions shown · non-contrast
Comparison: [DATE]

CLINICAL DATA: Status post right thoracentesis

EXAM:
CHEST  1 VIEW

[chest ap]
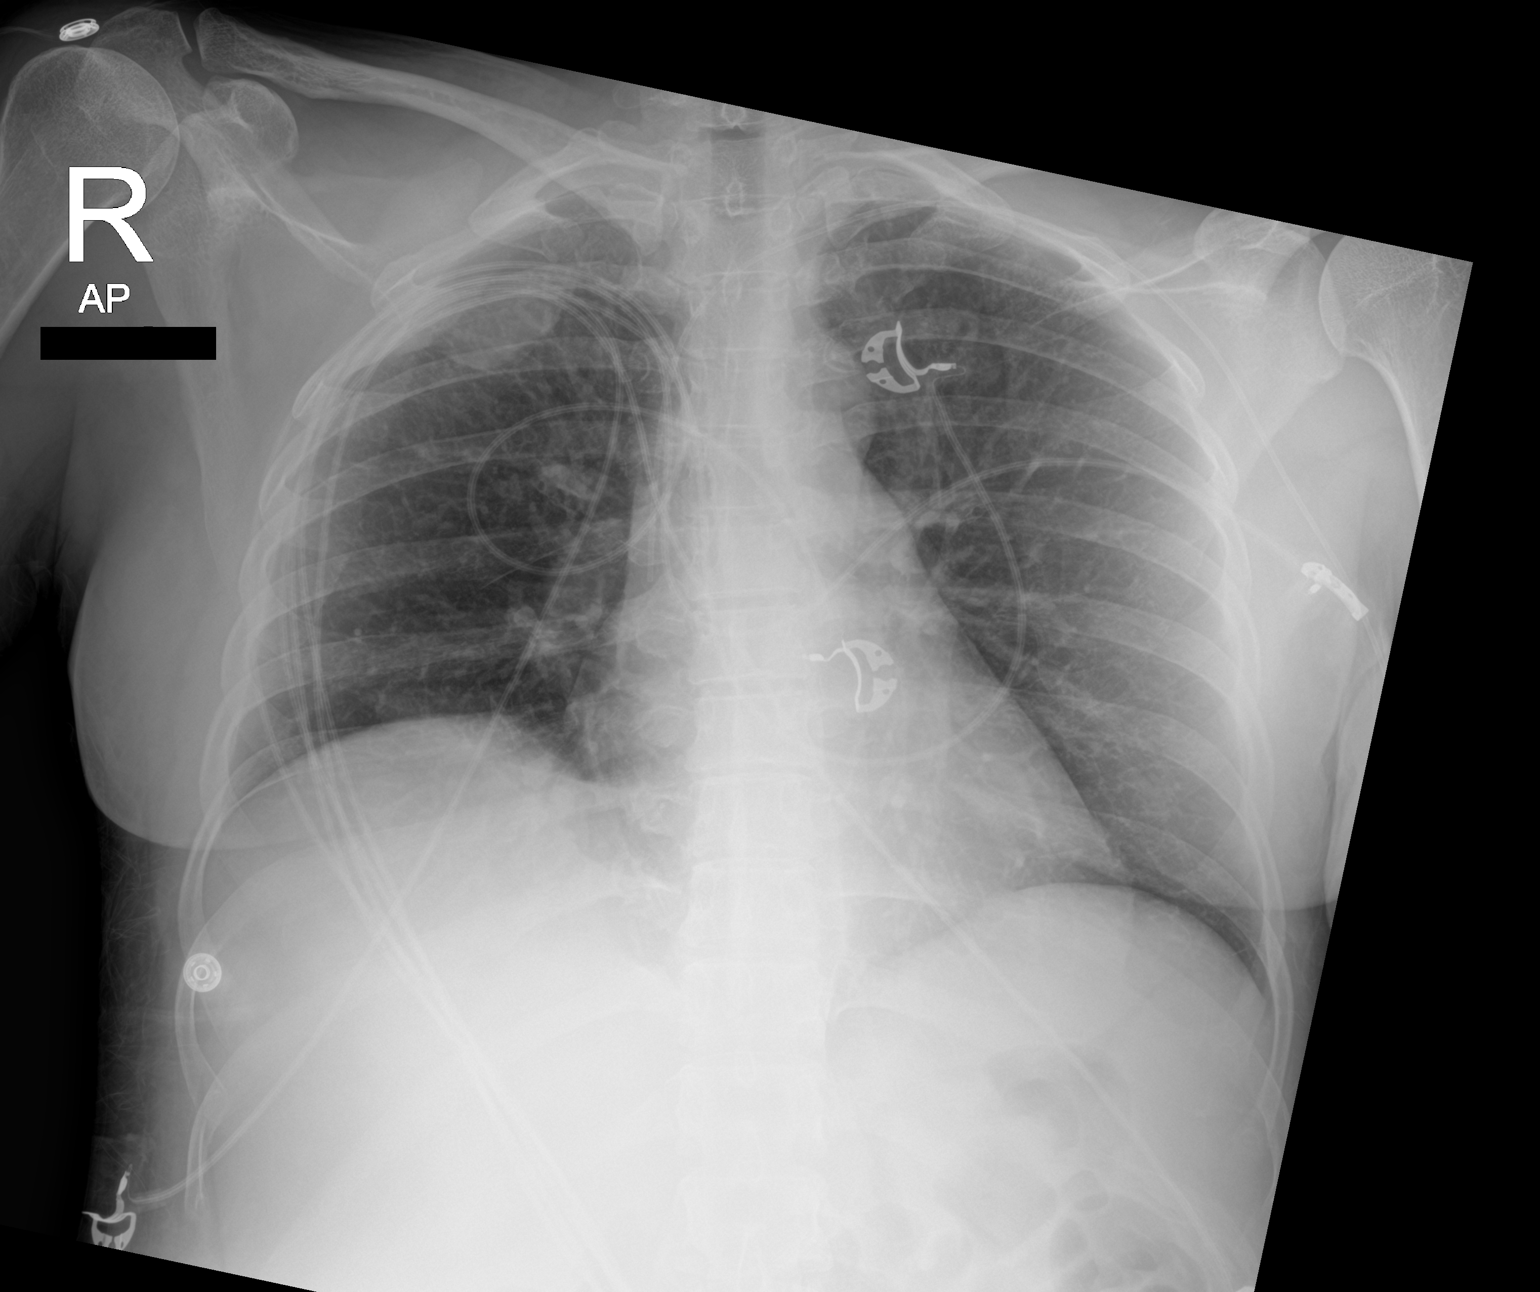

[1 of 1 positions shown; findings below may reference images not displayed]

FINDINGS: Cardiac shadow is within normal limits. The lungs are well aerated.
No pneumothorax is noted following right thoracentesis. No sizable
effusion is seen. No bony abnormality is noted.
IMPRESSION: No evidence of pneumothorax.

## 2019-08-09 MED ORDER — LIDOCAINE HCL 1 % IJ SOLN
INTRAMUSCULAR | Status: AC
  Filled 2019-08-09: qty 20

## 2019-08-09 MED ORDER — LIDOCAINE HCL 1 % IJ SOLN
INTRAMUSCULAR | Status: AC | PRN
  Administered 2019-08-09: 5 mL

## 2019-08-09 NOTE — Consult Note (Signed)
Chief Complaint: Patient was seen in consultation today for abscesses, MRSA bacteremia  Referring Physician(s): Dr. Kipp Brood  Supervising Physician: Markus Daft  Patient Status: [facility] - In-pt  History of Present Illness: Leslie Molina is a 34 y.o. female with past medical history of anxiety, depression, bipolar 1 disorder, and prior substance abuse who presented to [facility] ED with sepsis and fevers, found to have MRSA bacteremia. Patient has been treated with IV abx for the past 6 days with little improvement. She has abscesses of the SI joint, psoas muscle, right hand, right supraclavicular region, chest wall, and apical fluid collection suspicious for empyema.   CT Chest 08/08/19: 1. Small loculated pleural effusion at the right lung apex adjacent to thickening of the intercostal muscles, slightly increased in size from CT 6 days ago. 2. Scattered nodular densities throughout both lungs, also new, likely infectious. No definite cavitary lesions. Dependent right lower lobe consolidation consistent with pneumonia. Minimal right pleural thickening without sub pulmonic effusion. 3. Small pericardial effusion.  TCTS was consulted for assess for her pleural and chest wall processes.  They noted the lack of accessible fluid collection and recommend IR investigate for possible chest/apical drainage.   Patient had a late breakfast this AM-- around 1030.   Past Medical History:  Diagnosis Date   Anxiety    Bipolar 1 disorder ([place])    Depression    Migraine    Nerve damage     Past Surgical History:  Procedure Laterality Date   CESAREAN SECTION      Allergies: Patient has no known allergies.  Medications: Prior to Admission medications   Medication Sig Start Date End Date Taking? Authorizing Provider  acetaminophen (TYLENOL) 325 MG tablet Take 2 tablets (650 mg total) by mouth every 6 (six) hours as needed for fever. 08/03/19  Yes Nolberto Hanlon, MD  ibuprofen (ADVIL) 200 MG  tablet Take 200 mg by mouth every 6 (six) hours as needed for moderate pain.   Yes [provider]  ceFEPIme 2 g in sodium chloride 0.9 % 100 mL Inject 2 g into the vein every 8 (eight) hours. Patient not taking: Reported on 08/03/2019 08/03/19   Nolberto Hanlon, MD  Chlorhexidine Gluconate Cloth 2 % PADS Apply 6 each topically daily. Patient not taking: Reported on 08/03/2019 08/03/19   Nolberto Hanlon, MD  clindamycin (CLEOCIN) 900 MG/50ML IVPB Inject 50 mLs (900 mg total) into the vein every 8 (eight) hours. Patient not taking: Reported on 08/03/2019 08/03/19   Nolberto Hanlon, MD  Dextrose-Sodium Chloride (DEXTROSE 5 % AND 0.45% NACL) infusion Inject 150 mL/hr into the vein continuous. Patient not taking: Reported on 08/03/2019 08/03/19   Nolberto Hanlon, MD  enoxaparin (LOVENOX) 40 MG/0.4ML injection Inject 0.4 mLs (40 mg total) into the skin daily. Patient not taking: Reported on 08/03/2019 08/03/19   Nolberto Hanlon, MD  norepinephrine (LEVOPHED) 4-5 MG/250ML-% SOLN Inject 0-40 mcg/min into the vein continuous. Patient not taking: Reported on 08/03/2019 08/03/19   Nolberto Hanlon, MD  oxyCODONE (OXY IR/ROXICODONE) 5 MG immediate release tablet Take 1 tablet (5 mg total) by mouth every 6 (six) hours as needed for breakthrough pain. Patient not taking: Reported on 08/03/2019 08/03/19   Nolberto Hanlon, MD  potassium chloride 10 MEQ/100ML Inject 100 mLs (10 mEq total) into the vein every 1 hour x 6 doses. Patient not taking: Reported on 08/03/2019 08/03/19   Nolberto Hanlon, MD  vancomycin (VANCOREADY) 750 MG/150ML SOLN Inject 150 mLs (750 mg total) into the vein every 8 (  eight) hours. Patient not taking: Reported on 08/03/2019 08/03/19   Nolberto Hanlon, MD     Family History  Problem Relation Age of Onset   Depression Mother    Bipolar disorder Mother    Depression Father    Bipolar disorder Father    Depression Sister    Bipolar disorder Sister     Social History   Socioeconomic History   Marital status: Single    Spouse  name: Not on file   Number of children: Not on file   Years of education: Not on file   Highest education level: Not on file  Occupational History   Not on file  Tobacco Use   Smoking status: Current Every Day Smoker    Packs/day: 1.00    Types: Cigarettes   Smokeless tobacco: Never Used  Substance and Sexual Activity   Alcohol use: No    Alcohol/week: 0.0 standard drinks   Drug use: No   Sexual activity: Not on file  Other Topics Concern   Not on file  Social History Narrative   Not on file   Social Determinants of Health   Financial Resource Strain:    Difficulty of Paying Living Expenses: Not on file  Food Insecurity:    Worried About [place] in the Last Year: Not on file   Ran Out of Food in the Last Year: Not on file  Transportation Needs:    Lack of Transportation (Medical): Not on file   Lack of Transportation (Non-Medical): Not on file  Physical Activity:    Days of Exercise per Week: Not on file   Minutes of Exercise per Session: Not on file  Stress:    Feeling of Stress : Not on file  Social Connections:    Frequency of Communication with Friends and Family: Not on file   Frequency of Social Gatherings with Friends and Family: Not on file   Attends Religious Services: Not on file   Active Member of Clubs or Organizations: Not on file   Attends Archivist Meetings: Not on file   Marital Status: Not on file     Review of Systems: A 12 point ROS discussed and pertinent positives are indicated in the HPI above.  All other systems are negative.  Review of Systems  Constitutional: Positive for fatigue and fever.  Respiratory: Negative for cough and shortness of breath.   Cardiovascular: Negative for chest pain.  Gastrointestinal: Negative for abdominal pain, nausea and vomiting.  Genitourinary: Negative for dysuria.  Musculoskeletal: Positive for arthralgias and joint swelling.  Psychiatric/Behavioral: Negative  for behavioral problems and confusion.    Vital Signs: BP 108/61 (BP Location: Right Arm)    Pulse 96    Temp 99.4 F (37.4 C) (Oral)    Resp 18    Ht 5\' 1"  (1.549 m)    Wt 150 lb 3.2 oz (68.1 kg)    LMP  (LMP Unknown)    SpO2 98%    BMI 28.38 kg/m   Physical Exam Vitals and nursing note reviewed.  Constitutional:      General: She is not in acute distress.    Appearance: She is ill-appearing.  HENT:     Mouth/Throat:     Mouth: Mucous membranes are moist.     Pharynx: Oropharynx is clear.  Cardiovascular:     Rate and Rhythm: Normal rate and regular rhythm.  Pulmonary:     Effort: Pulmonary effort is normal. No respiratory distress.  Breath sounds: Normal breath sounds.  Abdominal:     General: Abdomen is flat.     Palpations: Abdomen is soft.  Skin:    General: Skin is warm and dry.  Neurological:     General: No focal deficit present.     Mental Status: She is alert and oriented to person, place, and time. Mental status is at baseline.  Psychiatric:        Mood and Affect: Mood normal.        Behavior: Behavior normal.        Thought Content: Thought content normal.        Judgment: Judgment normal.      Imaging: DG Lumbar Spine 2-3 Views  Result Date: 08/02/2019 CLINICAL DATA:  Radicular back pain to the right lower extremity, no injury. EXAM: LUMBAR SPINE - 2-3 VIEW COMPARISON:  06/10/2013. FINDINGS: Very mild levoconvex curvature of the lumbar spine may be positional. Alignment is otherwise anatomic. Vertebral body and disc space height are normal. Mild facet sclerosis in the lower lumbar spine. Intrauterine contraceptive device is incidentally noted. IMPRESSION: Mild facet sclerosis at L5-S1. Electronically Signed   By: Lorin Picket M.D.   On: 08/02/2019 09:59   DG Shoulder 1V Right  Result Date: 08/03/2019 CLINICAL DATA:  Abscess. EXAM: RIGHT SHOULDER - 1 VIEW COMPARISON:  Chest CT yesterday. FINDINGS: There is no evidence of fracture or dislocation on single  view. Overlying monitoring device projects over the acromioclavicular joint limiting assessment. No bony destruction or periosteal reaction. Mild soft tissue edema. No soft tissue air or radiopaque foreign body. IMPRESSION: Mild soft tissue edema. No acute osseous abnormality. Electronically Signed   By: Keith Rake M.D.   On: 08/03/2019 19:40   DG Abd 1 View  Result Date: 08/03/2019 CLINICAL DATA:  Peritonitis. EXAM: ABDOMEN - 1 VIEW COMPARISON:  Abdominal CT yesterday. FINDINGS: Left aspect of the abdomen not included in the field of view. Air-filled small and large bowel in a nonobstructive pattern. No evidence of free air. Intrauterine device in the left pelvis. IMPRESSION: Nonobstructive bowel gas pattern, no evidence of free air. Left aspect of the abdomen not included in the field of view. Electronically Signed   By: Keith Rake M.D.   On: 08/03/2019 19:41   CT SOFT TISSUE NECK W CONTRAST  Result Date: 08/06/2019 CLINICAL DATA:  Infection, soft tissue infection, abscess; soft tissue mass, neck, no prior imaging. Additional history provided: Swelling, pain, redness to right side of body where the neck meets the shoulder. EXAM: CT NECK WITH CONTRAST TECHNIQUE: Multidetector CT imaging of the neck was performed using the standard protocol following the bolus administration of intravenous contrast. CONTRAST:  9mL OMNIPAQUE IOHEXOL 350 MG/ML SOLN COMPARISON:  CT angiogram chest 08/02/2019 FINDINGS: Pharynx and larynx: No appreciable mass or swelling within the oral cavity, pharynx or larynx. Salivary glands: No inflammation, mass, or stone. Thyroid: Unremarkable thyroid gland. Lymph nodes: No pathologically enlarged cervical chain lymph nodes. Vascular: The major vascular structures of the neck appear patent. Limited intracranial: No acute intracranial abnormality identified Visualized orbits: Visualized orbits demonstrate no acute abnormality. Mastoids and visualized paranasal sinuses: The  bilateral maxillary sinuses demonstrate mild mucosal thickening and reactive osteitis. No significant mastoid effusion. Skeleton: No acute bony abnormality or aggressive osseous lesion. C5-C6 disc bulge. Upper chest: New from prior CTA chest 08/02/2019, there are numerous peripheral predominant pulmonary nodules within the imaged lung apices suspicious for septic emboli. There is a loculated collection within the right pleural space  along the lateral aspect of the right lung apex measuring 8.5 x 2.7 cm in transaxial dimensions. There is a punctate focus of gas within this collection. Findings likely reflect empyema. In retrospect, this finding was present on prior examination 08/02/2019, but has increased in size since this prior study. No definite erosion of the adjacent ribs. There is extension of low-density edema into the adjacent right pectoralis musculature measuring 3.9 x 2.2 cm in transaxial dimensions (for instance as seen series 3, image 104). Associated inflammatory stranding within the right retroclavicular region and lower right neck. Partially visualized left IJ approach central venous catheter. These results will be called to the ordering clinician or representative by the Radiologist Assistant, and communication documented in the PACS or zVision Dashboard. IMPRESSION: 1. Multiple peripheral predominant nodules within the imaged lung apices, new as compared to prior examination 08/02/2019 and highly suspicious for septic emboli. 2. Right pleural collection containing a punctate focus of gas along the lateral aspect of the right lung apex likely reflecting empyema. Extension of low attenuation edema into the adjacent right pectoralis musculature. No peripheral enhancement is demonstrated at this site on the current examination to suggest formed abscess. Associated inflammatory stranding within the right retroclavicular region and right lower neck consistent with soft tissue infection. Dedicated chest  CT is recommended for further evaluation. Electronically Signed   By: Kellie Simmering DO   On: 08/06/2019 18:39   CT CHEST WO CONTRAST  Result Date: 08/08/2019 CLINICAL DATA:  Empyema. Sepsis secondary to MRSA bacteremia. Leukocytosis, tachycardia and tachypnea. EXAM: CT CHEST WITHOUT CONTRAST TECHNIQUE: Multidetector CT imaging of the chest was performed following the standard protocol without IV contrast. COMPARISON:  Chest radiograph 08/03/2019, chest CT 08/02/2019 FINDINGS: Cardiovascular: Left upper extremity PICC in place. No aortic aneurysm. Heart is normal in size. Small pericardial effusion measuring 12 mm in depth inferiorly. Mediastinum/Nodes: Small mediastinal lymph nodes not enlarged by size criteria. Limited assessment for hilar adenopathy given lack of IV contrast. No esophageal wall thickening. Lungs/Pleura: Dependent consolidation in right lower lobe has air bronchograms, suspicious for pneumonia. Minimal dependent right pleural thickening without frank effusion. Loculated pleural fluid at the anterior right upper lobe measures up to 3 cm, increased since prior. Multiple small pulmonary nodules and nodular densities are new from prior exam, likely infectious. No cavitary lesions. Streaky opacities in the dependent left lower lobe, likely atelectasis. Upper Abdomen: No acute findings on noncontrast exam. Moderate volume of stool in the included colon. Musculoskeletal: Soft tissue thickening involving the right first and second intercostal muscles again seen, less well-defined currently in the absence of IV contrast. There is soft tissue edema in the adjacent right axilla, no subcutaneous fluid collection. No periosteal reaction or bony destructive change. IMPRESSION: 1. Small loculated pleural effusion at the right lung apex adjacent to thickening of the intercostal muscles, slightly increased in size from CT 6 days ago. 2. Scattered nodular densities throughout both lungs, also new, likely  infectious. No definite cavitary lesions. Dependent right lower lobe consolidation consistent with pneumonia. Minimal right pleural thickening without sub pulmonic effusion. 3. Small pericardial effusion. Electronically Signed   By: Keith Rake M.D.   On: 08/08/2019 19:14   CT Angio Chest PE W and/or Wo Contrast  Addendum Date: 08/03/2019   ADDENDUM REPORT: 08/03/2019 12:49 ADDENDUM: Not mentioned above: Thickening of the intercostal muscle between the right anterior first and second ribs with adjacent pleural reaction and soft tissue edema in the adjacent subcutaneous fat likely reflecting an inflammatory process  given the patient's abnormalities along the right iliopsoas muscles and right sacroiliitis. No drainable fluid collection. These findings were discussed with Dr. Patsey Berthold. Electronically Signed   By: Kathreen Devoid   On: 08/03/2019 12:49   Result Date: 08/03/2019 CLINICAL DATA:  Shortness of breath, severe back pain radiating down the right leg EXAM: CT ANGIOGRAPHY CHEST CT ABDOMEN AND PELVIS WITH CONTRAST TECHNIQUE: Multidetector CT imaging of the chest was performed using the standard protocol during bolus administration of intravenous contrast. Multiplanar CT image reconstructions and MIPs were obtained to evaluate the vascular anatomy. Multidetector CT imaging of the abdomen and pelvis was performed using the standard protocol during bolus administration of intravenous contrast. CONTRAST:  23mL OMNIPAQUE IOHEXOL 350 MG/ML SOLN COMPARISON:  None. FINDINGS: CTA CHEST FINDINGS Cardiovascular: Satisfactory opacification of the pulmonary arteries to the segmental level. No evidence of pulmonary embolism. Normal heart size. No pericardial effusion. Mediastinum/Nodes: No enlarged mediastinal, hilar, or axillary lymph nodes. Thyroid gland, trachea, and esophagus demonstrate no significant findings. Lungs/Pleura: Lungs are clear. No pleural effusion or pneumothorax. Musculoskeletal: No chest wall  abnormality. No acute or significant osseous findings. Review of the MIP images confirms the above findings. CT ABDOMEN and PELVIS FINDINGS Hepatobiliary: No focal liver abnormality is seen. No gallstones, gallbladder wall thickening, or biliary dilatation. Pancreas: Unremarkable. No pancreatic ductal dilatation or surrounding inflammatory changes. Spleen: Normal in size without focal abnormality. Adrenals/Urinary Tract: Adrenal glands are unremarkable. Kidneys are normal, without renal calculi, focal lesion, or hydronephrosis. Bladder is unremarkable. Stomach/Bowel: Stomach is within normal limits. Appendix appears normal. No evidence of bowel wall thickening, distention, or inflammatory changes. Vascular/Lymphatic: No significant vascular findings are present. No enlarged abdominal or pelvic lymph nodes. Reproductive: No adnexal mass. T-type IUD within the uterus. A tip of 'T' has perforated through the uterine wall and projects into the peroneal cavity. Other: No abdominal wall hernia or abnormality. No abdominopelvic ascites. Musculoskeletal: No acute osseous abnormality. No aggressive osseous lesion. Vertebral body heights are maintained and are in normal anatomic alignment. No bone destruction or periosteal reaction. Mild osteoarthritis of bilateral SI joints. Heterogeneous enhancement and severe expansion of the right psoas and iliacus muscles with mild adjacent soft tissue edema. Review of the MIP images confirms the above findings. IMPRESSION: 1. Heterogeneous enhancement and severe expansion of the right psoas and iliacus muscles with mild adjacent soft tissue edema. Overall appearance is concerning for infectious myositis versus less likely muscle strain and intramuscular hemorrhage. Subtle small hypodensities within the iliopsoas muscle measuring up to 13 mm concerning for an intramuscular abscess. The lumbar spine demonstrates no focal abnormality to suggest discitis or osteomyelitis, but MRI of the  lumbar spine without and with intravenous contrast is recommended for increased sensitivity if there is concern for discitis/osteomyelitis. 2. T-type IUD within the uterus. A tip of 'T' has perforated through the uterine wall and projects into the peroneal cavity. 3. No acute cardiopulmonary disease. Electronically Signed: By: Kathreen Devoid On: 08/02/2019 12:09   CT GUIDED NEEDLE PLACEMENT  Result Date: 08/02/2019 INDICATION: History of intravenous drug abuse, now with right pelvic/retroperitoneal abscess. Please perform CT-guided aspiration and/or drainage catheter placement. EXAM: CT-GUIDED RIGHT ILIACUS MUSCULAR ABSCESS ASPIRATION COMPARISON:  CT abdomen pelvis-earlier same day; lumbar spine and right hip MRI-earlier same MEDICATIONS: None ANESTHESIA/SEDATION: Moderate (conscious) sedation was employed during this procedure. A total of Versed 3 mg and Fentanyl 150 mcg was administered intravenously. Moderate Sedation Time: 34 minutes. The patient's level of consciousness and vital signs were monitored continuously by radiology  nursing throughout the procedure under my direct supervision. CONTRAST:  None COMPLICATIONS: None immediate. PROCEDURE: Informed written consent was obtained from the patient after a discussion of the risks, benefits and alternatives to treatment. The patient was placed supine on the CT gantry and a pre procedural CT was performed re-demonstrating the known abscess/fluid collection within the right iliacus musculature with dominant ill-defined component measuring approximately 2.4 x 2.0 cm (image 60, series 2). The procedure was planned. A timeout was performed prior to the initiation of the procedure. The skin overlying the inferior anterior aspect of the right lower abdomen/pelvis was prepped and draped in the usual sterile fashion. The overlying soft tissues were anesthetized with 1% lidocaine with epinephrine. Appropriate trajectory was planned with the use of a 22 gauge spinal  needle. An 18 gauge trocar needle was advanced into the abscess/fluid collection. Appropriate positioning was confirmed with a limited CT scan. Given the small size of the collection as well as lack of peripheral wall enhancement on preceding contrast-enhanced abdominal CT, the decision was made to pursue aspiration at this time. Next, approximately 3 cc of purulent appearing fluid was aspirated as the trocar needle was slowly retracted. All aspirated fluid was capped and sent to the laboratory for analysis. A dressing was applied. The patient tolerated the procedure well without immediate post procedural complication. IMPRESSION: Successful CT guided aspiration of approximately 3 cc of purulent appearing fluid from right iliacus intramuscular abscess, currently not amenable to image guided drainage catheter placement. All aspirated fluid was capped and sent to the laboratory as requested by the ordering clinical team. Electronically Signed   By: Sandi Mariscal M.D.   On: 08/02/2019 17:53   MR LUMBAR SPINE WO CONTRAST  Result Date: 08/02/2019 CLINICAL DATA:  Low back pain with radiculopathy. No recent injury EXAM: MRI LUMBAR SPINE WITHOUT CONTRAST TECHNIQUE: Multiplanar, multisequence MR imaging of the lumbar spine was performed. No intravenous contrast was administered. COMPARISON:  CT 08/02/2019 FINDINGS: Technical note: Examination is slightly degraded by patient motion artifact. Patient was unable to tolerate any further imaging and no postcontrast sequences were able to be performed. Segmentation:  Standard. Alignment:  Physiologic. Vertebrae:  No fracture, evidence of discitis, or bone lesion. Conus medullaris and cauda equina: Conus extends to the L2 level. Conus and cauda equina appear normal. Paraspinal and other soft tissues: Expansion and extensive fluid and edema signal within the right psoas muscle as well as the visualized portion of the right iliacus muscle. Fluid collection within the medial aspect  of the right iliacus muscle measures up to 2.2 x 1.3 cm in transaxial dimension. The craniocaudal extent of the collection is not entirely included within the field of view. Fluid signal is seen within both the medial iliacus and or distal psoas muscle at the edge of the field of view which appears to extend from the right sacroiliac joint (series 7, image 36). There is patchy bone marrow edema within the right sacrum adjacent to the right SI joint. Visualized portion of the left psoas muscle is normal. The posterior paraspinal musculature is normal. The canal from the L4-5 level and caudally is slightly heterogeneous, which is felt to most likely be secondary to motion artifact and prominence of the epidural fat. No definite epidural fluid collection. Disc levels: T12-L1: No significant disc protrusion, foraminal stenosis, or canal stenosis. L1-L2: No significant disc protrusion, foraminal stenosis, or canal stenosis. L2-L3: No significant disc protrusion, foraminal stenosis, or canal stenosis. L3-L4: No significant disc protrusion, foraminal stenosis, or  canal stenosis. L4-L5: No significant disc protrusion, foraminal stenosis, or canal stenosis. L5-S1: No significant disc protrusion, foraminal stenosis, or canal stenosis. IMPRESSION: 1. Limited study due to patient motion artifact. Patient was unable to tolerate any postcontrast imaging. 2. 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 Please see dedicated MRI of the pelvis and right hip which more fully images the musculature for further detail. 3. Negative for osteomyelitis-discitis. 4. Slightly heterogeneous appearance of the distal lumbar canal below the L4-5 level on STIR sequences, which is favored to be secondary to a combination of prominent epidural fat and motion artifact. Postcontrast imaging would be helpful to exclude the presence of  epidural extension of infection. 5. No significant disc disease. No evidence of nerve impingement. Electronically Signed   By: Davina Poke D.O.   On: 08/02/2019 13:40   MR LUMBAR SPINE W CONTRAST  Result Date: 08/03/2019 CLINICAL DATA:  Extensive right iliopsoas abscess. EXAM: MRI LUMBAR SPINE WITH CONTRAST TECHNIQUE: Postcontrast sagittal and axial images were obtained and compared with the prior MRI of 08/02/2019. CONTRAST:  72mL GADAVIST GADOBUTROL 1 MMOL/ML IV SOLN COMPARISON:  MRI dated 08/02/2019 FINDINGS: Segmentation:  Standard. Alignment:  Physiologic. Vertebrae:  No pathologic enhancement after contrast administration. Paraspinal and other soft tissues: Extensive right iliopsoas abscess is again noted. Septic right sacroiliac joint is noted with edema and enhancement in the right gluteal muscles and in the right multifidus muscle erector spinae muscle at the L5-S1 level. Enhancement of the right sacral ala consistent with osteomyelitis. Disc levels: There is no pathologic enhancement within the spinal canal. No evidence of epidural extension of the abscess at this time. No pathologic epidural enhancement. IMPRESSION: 1. 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 2. Extensive right iliopsoas abscess. 3. Edema and enhancement of the right gluteal muscles and right multifidus muscle at the L5-S1 level consistent with myositis. 4. No evidence of epidural extension of abscess at this time. Electronically Signed   By: Lorriane Shire M.D.   On: 08/03/2019 15:06   CT ABDOMEN PELVIS W CONTRAST  Addendum Date: 08/03/2019   ADDENDUM REPORT: 08/03/2019 12:49 ADDENDUM: Not mentioned above: Thickening of the intercostal muscle between the right anterior first and second ribs with adjacent pleural reaction and soft tissue edema in the adjacent subcutaneous fat likely reflecting an inflammatory process given the patient's abnormalities along the right iliopsoas muscles  and right sacroiliitis. No drainable fluid collection. These findings were discussed with Dr. Patsey Berthold. Electronically Signed   By: Kathreen Devoid   On: 08/03/2019 12:49   Result Date: 08/03/2019 CLINICAL DATA:  Shortness of breath, severe back pain radiating down the right leg EXAM: CT ANGIOGRAPHY CHEST CT ABDOMEN AND PELVIS WITH CONTRAST TECHNIQUE: Multidetector CT imaging of the chest was performed using the standard protocol during bolus administration of intravenous contrast. Multiplanar CT image reconstructions and MIPs were obtained to evaluate the vascular anatomy. Multidetector CT imaging of the abdomen and pelvis was performed using the standard protocol during bolus administration of intravenous contrast. CONTRAST:  46mL OMNIPAQUE IOHEXOL 350 MG/ML SOLN COMPARISON:  None. FINDINGS: CTA CHEST FINDINGS Cardiovascular: Satisfactory opacification of the pulmonary arteries to the segmental level. No evidence of pulmonary embolism. Normal heart size. No pericardial effusion. Mediastinum/Nodes: No enlarged mediastinal, hilar, or axillary lymph nodes. Thyroid gland, trachea, and esophagus demonstrate no significant findings. Lungs/Pleura: Lungs are clear. No pleural effusion or pneumothorax. Musculoskeletal: No chest wall abnormality.  No acute or significant osseous findings. Review of the MIP images confirms the above findings. CT ABDOMEN and PELVIS FINDINGS Hepatobiliary: No focal liver abnormality is seen. No gallstones, gallbladder wall thickening, or biliary dilatation. Pancreas: Unremarkable. No pancreatic ductal dilatation or surrounding inflammatory changes. Spleen: Normal in size without focal abnormality. Adrenals/Urinary Tract: Adrenal glands are unremarkable. Kidneys are normal, without renal calculi, focal lesion, or hydronephrosis. Bladder is unremarkable. Stomach/Bowel: Stomach is within normal limits. Appendix appears normal. No evidence of bowel wall thickening, distention, or inflammatory  changes. Vascular/Lymphatic: No significant vascular findings are present. No enlarged abdominal or pelvic lymph nodes. Reproductive: No adnexal mass. T-type IUD within the uterus. A tip of 'T' has perforated through the uterine wall and projects into the peroneal cavity. Other: No abdominal wall hernia or abnormality. No abdominopelvic ascites. Musculoskeletal: No acute osseous abnormality. No aggressive osseous lesion. Vertebral body heights are maintained and are in normal anatomic alignment. No bone destruction or periosteal reaction. Mild osteoarthritis of bilateral SI joints. Heterogeneous enhancement and severe expansion of the right psoas and iliacus muscles with mild adjacent soft tissue edema. Review of the MIP images confirms the above findings. IMPRESSION: 1. Heterogeneous enhancement and severe expansion of the right psoas and iliacus muscles with mild adjacent soft tissue edema. Overall appearance is concerning for infectious myositis versus less likely muscle strain and intramuscular hemorrhage. Subtle small hypodensities within the iliopsoas muscle measuring up to 13 mm concerning for an intramuscular abscess. The lumbar spine demonstrates no focal abnormality to suggest discitis or osteomyelitis, but MRI of the lumbar spine without and with intravenous contrast is recommended for increased sensitivity if there is concern for discitis/osteomyelitis. 2. T-type IUD within the uterus. A tip of 'T' has perforated through the uterine wall and projects into the peroneal cavity. 3. No acute cardiopulmonary disease. Electronically Signed: By: Kathreen Devoid On: 08/02/2019 12:09   MR HAND RIGHT WO CONTRAST  Result Date: 08/09/2019 CLINICAL DATA:  Hand pain swelling. EXAM: MRI OF THE RIGHT HAND WITHOUT CONTRAST TECHNIQUE: Multiplanar, multisequence MR imaging of the right hand was performed. No intravenous contrast was administered. COMPARISON:  Radiographs 08/03/2019 FINDINGS: Focal inflammatory changes  around the third MCP joint with soft tissue swelling/edema mainly around the dorsum of the hand. Somewhat ill-defined fluid collection measures 14 mm and is superficial to the extensor tendon. The does appear to be some inflammatory tenosynovitis also. A small joint effusion is noted but I do not see any definite MR findings for septic arthritis or osteomyelitis. The flexor tendons appear normal. The radial and ulnar collateral ligaments are intact. No findings for diffuse cellulitis or myofasciitis. IMPRESSION: 1. MR focal inflammatory changes around the third MCP joints as discussed above. Possible ill-defined superficial abscess dorsally. 2. No findings for septic arthritis or osteomyelitis. 3. No findings for diffuse cellulitis or myofasciitis. Electronically Signed   By: Marijo Sanes M.D.   On: 08/09/2019 06:21   DG Hand 2 View Right  Result Date: 08/03/2019 CLINICAL DATA:  Abscess. Peritonitis. EXAM: RIGHT HAND - 2 VIEW COMPARISON:  Right hand radiograph 01/30/2019 FINDINGS: There is no evidence of fracture or dislocation. There is no evidence of arthropathy or other focal bone abnormality. Generalized soft tissue edema. No soft tissue air or radiopaque foreign body. IMPRESSION: Generalized soft tissue edema. No soft tissue air or radiopaque foreign body. No osseous abnormality. Electronically Signed   By: Keith Rake M.D.   On: 08/03/2019 19:38   MR HIP RIGHT WO CONTRAST  Result Date: 08/02/2019  CLINICAL DATA:  Severe back pain radiating down the right hip and leg. Fever. EXAM: MR OF THE RIGHT HIP WITHOUT CONTRAST TECHNIQUE: Multiplanar, multisequence MR imaging was performed. No intravenous contrast was administered. COMPARISON:  CT scan of the abdomen and pelvis dated 08/02/2019 FINDINGS: Bones: There is abnormal edema in the right side of the sacrum and in the right iliac bone with fluid in the right SI joint, likely representing osteomyelitis and sepsis of the right sacroiliac joint. Articular  cartilage and labrum Articular cartilage:  Normal. Labrum:  Normal. Joint or bursal effusion Joint effusion:  Trace joint effusion. Bursae: No bursitis. Muscles and tendons Muscles and tendons: Extensive right iliopsoas abscess extending from at least the L3 level to the insertion of the iliopsoas tendon on the lesser trochanter. Is extensive edema in the iliopsoas muscle consistent with myositis. There is also edema in 1 of the right hip abductor muscles with fluid along the fascia of the muscles in the anterior aspect of the proximal right thigh likely representing infectious fasciitis. There is also extension of abscess into the right gluteal muscles through the a posterior aspect of the right sacroiliac joint seen on images 1 and 2 of series 19. There is edema in the gluteus minimus, medius, and maximus muscles consistent with myositis. There is edema in the right piriformis muscle consistent with myositis seen on image 8 of series 19. Other findings Miscellaneous:   Distended urinary bladder. IMPRESSION: 1. Extensive right iliopsoas abscess extending from at least the L3-4 level to the insertion of the iliopsoas tendon on the lesser trochanter. 2. 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 3. Myositis of the right gluteus minimus, medius, and maximus and right piriformis muscles. 4. 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 5. Abscess extends through the posterior aspect of the right SI joint into the gluteal musculature. 6. Fluid along the fascial planes in the anterior aspect of the proximal right thigh likely representing infectious fasciitis. 7. MRI with contrast may better define the extent of the infection and provide a baseline for a serial evaluation. Electronically Signed   By: Lorriane Shire M.D.   On: 08/02/2019 13:45   DG Chest Port 1 View  Result Date: 08/03/2019 CLINICAL DATA:  Status post central line placement. EXAM: PORTABLE CHEST 1 VIEW COMPARISON:  Single-view of  the chest 06/10/2013. FINDINGS: Left IJ approach central venous catheter is in place with the tip projecting in the right atrium. Recommend 3 cm withdrawal. Elevation of the right hemidiaphragm is noted. There is mild basilar atelectasis. No pneumothorax or pleural effusion. Heart size is normal. IMPRESSION: Left IJ catheter tip projects in the right atrium. Recommend withdrawal of 3 cm. Negative for pneumothorax. No acute disease. Electronically Signed   By: Inge Rise M.D.   On: 08/03/2019 12:20   ECHOCARDIOGRAM LIMITED  Result Date: 08/03/2019    ECHOCARDIOGRAM LIMITED REPORT   Patient Name:   OMARIANA RANDELL Date of Exam: 08/03/2019 Medical Rec #:  [id number]  Height:       61.0 in Accession #:    [id number] Weight:       145.0 lb Date of Birth:  [birth year]  BSA:          1.647 m Patient Age:    33 years   BP:           87/42 mmHg Patient Gender: F          HR:  121 bpm. Exam Location:  [facility] Procedure: 2D Echo, Cardiac Doppler and Color Doppler Indications:     Endocarditis 138  History:         Patient has no prior history of Echocardiogram examinations.                  Anxiety, bipolar 1 disorder, migraine.  Sonographer:     Sherrie Sport RDCS (AE) Referring Phys:  [place] Diagnosing Phys: Bartholome Bill MD IMPRESSIONS  1. No evidence of vegetations. Somewhat tachycardic but able to see valves fairly well.  2. Left ventricular ejection fraction, by estimation, is 65 to 70%. The left ventricle has normal function. Left ventricular diastolic parameters were normal.  3. Right ventricular systolic function is normal. The right ventricular size is mildly enlarged. There is moderately elevated pulmonary artery systolic pressure.  4. Left atrial size was mildly dilated.  5. The mitral valve is grossly normal. Mild mitral valve regurgitation.  6. The aortic valve is grossly normal. Aortic valve regurgitation is trivial. Conclusion(s)/Recommendation(s): No evidence of valvular vegetations on  this transthoracic echocardiogram. Would recommend a transesophageal echocardiogram to exclude infective endocarditis if clinically indicated. FINDINGS  Left Ventricle: Left ventricular ejection fraction, by estimation, is 65 to 70%. The left ventricle has normal function. The left ventricular internal cavity size was normal in size. There is no left ventricular hypertrophy. Right Ventricle: The right ventricular size is mildly enlarged. No increase in right ventricular wall thickness. Right ventricular systolic function is normal. There is moderately elevated pulmonary artery systolic pressure. The tricuspid regurgitant velocity is 3.11 m/s, and with an assumed right atrial pressure of 10 mmHg, the estimated right ventricular systolic pressure is [id number] mmHg. Left Atrium: Left atrial size was mildly dilated. Pericardium: There is no evidence of pericardial effusion. Mitral Valve: The mitral valve is grossly normal. Mild mitral valve regurgitation. There is no evidence of mitral valve vegetation. Tricuspid Valve: The tricuspid valve is grossly normal. Tricuspid valve regurgitation is mild. Aortic Valve: The aortic valve is grossly normal. Aortic valve regurgitation is trivial. There is no evidence of aortic valve vegetation. Pulmonic Valve: The pulmonic valve was grossly normal. Pulmonic valve regurgitation is trivial. There is no evidence of pulmonic valve vegetation. Aorta: The aortic root is normal in size and structure.  LEFT VENTRICLE PLAX 2D LVIDd:         4.82 cm LVIDs:         2.60 cm LV PW:         1.11 cm LV IVS:        0.95 cm LVOT diam:     2.20 cm LVOT Area:     3.80 cm  LEFT ATRIUM         Index LA diam:    4.10 cm 2.49 cm/m                        PULMONIC VALVE AORTA                 PV Vmax:        1.00 m/s Ao Root diam: 2.60 cm PV Peak grad:   4.0 mmHg                       RVOT Peak grad: 8 mmHg  TRICUSPID VALVE TR Peak grad:   38.7 mmHg TR Vmax:        311.00 cm/s  SHUNTS Systemic Diam: 2.20  cm  Bartholome Bill MD Electronically signed by Bartholome Bill MD Signature Date/Time: 08/03/2019/11:20:44 AM    Final    [place] EKG SITE RITE  Result Date: 08/07/2019 If Site Rite image not attached, placement could not be confirmed due to current cardiac rhythm.  [place] EKG SITE RITE  Result Date: 08/07/2019 If Site Rite image not attached, placement could not be confirmed due to current cardiac rhythm.   Labs:  CBC: Recent Labs    08/05/19 0530 08/06/19 0541 08/07/19 0615 08/08/19 0339  WBC 11.0* 11.0* 13.3* 11.9*  HGB 9.5* 10.4* 10.1* 10.0*  HCT 28.2* 31.5* 31.6* 30.8*  PLT 386 437* 425* 464*    COAGS: Recent Labs    08/03/19 0250  INR 1.3*    BMP: Recent Labs    08/05/19 0530 08/06/19 0541 08/07/19 0615 08/08/19 0339  NA 135 135 139 135  K 3.1* 3.1* 4.6 4.1  CL 98 96* 106 102  CO2 25 28 25 24   GLUCOSE 149* 141* 112* 148*  BUN <5* <5* <5* 7  CALCIUM 7.6* 7.7* 7.8* 8.0*  CREATININE 0.58 0.54 0.36* 0.45  GFRNONAA >60 >60 >60 >60  GFRAA >60 >60 >60 >60    LIVER FUNCTION TESTS: Recent Labs    08/05/19 0530 08/06/19 0541 08/07/19 0615 08/08/19 0339  BILITOT 0.5 0.5 0.7 0.3  AST 56* 57* 29 35  ALT 36 48* 42 53*  ALKPHOS 121 127* 106 122  PROT 5.2* 5.6* 5.4* 5.7*  ALBUMIN 1.7* 1.8* 1.7* 1.7*    TUMOR MARKERS: No results for input(s): AFPTM, CEA, CA199, CHROMGRNA in the last 8760 hours.  Assessment and Plan: Empyema vs. Pleural effusion Patient with possible apical abscess/empyema based on Chest CT from yesterday.  TCTS recommends IR evaluate for possible drainage.  Dr. Anselm Pancoast notes limited visualization with CT without contrast.  Plan to proceed with assessment via [place] today as schedule allows.  Patient is not NPO today.  If drainage/chest tube is needed, will set for possible procedure tomorrow.  Again, this will be based on any findings today vs. Procedure planning for tomorrow.   Patient assessed at bedside.  She is agreeable.  She reports she does not feel  better after 6-7 days of IV Vanc and would be interested in procedures that will help with source control and improve her symptoms.   Risks and benefits discussed with the patient including bleeding, infection, damage to adjacent structures, and sepsis.  All of the patient's questions were answered, patient is agreeable to proceed. Consent signed and in chart.  Thank you for this interesting consult.  I greatly enjoyed meeting Jaxon Gilles and look forward to participating in their care.  A copy of this report was sent to the requesting provider on this date.  Electronically Signed: Docia Barrier, PA 08/09/2019, 3:30 PM   I spent a total of 40 Minutes    in face to face in clinical consultation, greater than 50% of which was counseling/coordinating care for MRSA bacteremia, pleural abscess.

## 2019-08-09 NOTE — Plan of Care (Signed)
  Problem: Nutrition: Goal: Adequate nutrition will be maintained Outcome: Progressing   Problem: Coping: Goal: Level of anxiety will decrease Outcome: Progressing   

## 2019-08-09 NOTE — Procedures (Signed)
Interventional Radiology Procedure:   Indications: Small right chest empyema  Procedure: US guided right thoracentesis  Findings: Complex small right pleural collection. Aspirated 13 ml of yellow purulent fluid.  Complications: None     EBL: less than 10 ml  Plan: CXR and send fluid for culture.   Leslie Molina R. Anselm Pancoast, MD  Pager: [phone]

## 2019-08-09 NOTE — Progress Notes (Signed)
[facility] PROGRESS NOTE    Progress Note  Leslie Molina  [id number] DOB: 10/28/1985 DOA: 08/03/2019 PCP: [facility] [place]     Brief Narrative:   Leslie Molina is an 34 y.o. 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  The abscess extends to the posterior aspect of the proximal right thigh.  Culture data: COVID 3/3 >negative  MRSA PCR 3/3 >Positive  Blood culture 3/3 >MRSA  Pelvic abcess culture 3/3 >Abundant Staph aures, full result pending.  Antibiotic regimen: Daptomycin 3/3 once Cefepime 3/3-3/4 Clindamycin 3/3 once Vancomycin 3/3>>   Assessment/Plan:   Sepsis secondary to MRSA bacteremia and iliopsoas abscess/osteomyelitis of the sacrum/myositis and empyema: CT of the neck on 08/06/2019 showed multiple peripheral predominant nodules at the lung apices suspicious for septic emboli and a right empyema with inflammation and stranding on the right retroclavicular region and right lower neck consistent with soft tissue infection. Patient continues to have fever and tachycardia infectious disease was consulted recommended IV vancomycin. Blood cultures from 08/03/2019 show staph aureus repeat blood cultures on 08/05/2019 showed no growth till date. CT of the chest showed multiloculated effusion in the right apex increase in size, with scatter nodules. Cardiothoracic surgery was consulted Dr. Orvan Seen did VATS with drainage of the empyema and possible decortication.  Iliopsoas/gluteal abscess/osteomyelitis of the sacrum and possible infection of the right dorsum hand and right sternoclavicular joint infection: MRI of the hip and lumbar spine as below. MRI  of the hand on 08/08/2019 showed focal inflammatory changes of the third MCP joints and possible ill-defined superficial abscess. She is currently on IV Dilaudid and oral oxycodone.  Peritonitis/IUD perforation: CT scan of the abdomen her abdominal exam is benign. Gynecology and general surgery was consulted and they have signed off. They recommended no surgical intervention at this time, no surgical intervention at this time.  Hypokalemia: Repleted orally now resolved.  Bipolar disorder/anxiety/depression: Home medications Cymbalta, Seroquel trazodone were held.   DVT prophylaxis: heparin Family Communication:none Disposition Plan/Barrier to D/C:   Code Status:     Code Status Orders  (From admission, onward)         Start     Ordered   08/03/19 1729  Full code  Continuous     08/03/19 1729        Code Status History    Date Active Date Inactive Code Status Order ID Comments User Context   08/02/2019 1859 08/03/2019 1708 Full Code [id number]  Collier Bullock, MD ED   Advance Care Planning Activity        IV Access:    Peripheral IV   Procedures and diagnostic studies:   CT CHEST WO CONTRAST  Result Date: 08/08/2019 CLINICAL DATA:  Empyema. Sepsis secondary to MRSA bacteremia. Leukocytosis, tachycardia and tachypnea. EXAM: CT CHEST WITHOUT CONTRAST TECHNIQUE: Multidetector CT imaging of the chest was performed following the standard protocol without IV contrast. COMPARISON:  Chest radiograph 08/03/2019, chest CT 08/02/2019 FINDINGS: Cardiovascular: Left upper extremity PICC in place. No aortic aneurysm. Heart is normal in size. Small pericardial effusion measuring 12 mm in depth inferiorly. Mediastinum/Nodes: Small mediastinal lymph nodes not enlarged by size criteria. Limited assessment for hilar adenopathy given lack  of IV contrast. No esophageal wall thickening. Lungs/Pleura: Dependent consolidation in right lower lobe has air bronchograms, suspicious for pneumonia.  Minimal dependent right pleural thickening without frank effusion. Loculated pleural fluid at the anterior right upper lobe measures up to 3 cm, increased since prior. Multiple small pulmonary nodules and nodular densities are new from prior exam, likely infectious. No cavitary lesions. Streaky opacities in the dependent left lower lobe, likely atelectasis. Upper Abdomen: No acute findings on noncontrast exam. Moderate volume of stool in the included colon. Musculoskeletal: Soft tissue thickening involving the right first and second intercostal muscles again seen, less well-defined currently in the absence of IV contrast. There is soft tissue edema in the adjacent right axilla, no subcutaneous fluid collection. No periosteal reaction or bony destructive change. IMPRESSION: 1. Small loculated pleural effusion at the right lung apex adjacent to thickening of the intercostal muscles, slightly increased in size from CT 6 days ago. 2. Scattered nodular densities throughout both lungs, also new, likely infectious. No definite cavitary lesions. Dependent right lower lobe consolidation consistent with pneumonia. Minimal right pleural thickening without sub pulmonic effusion. 3. Small pericardial effusion. Electronically Signed   By: Keith Rake M.D.   On: 08/08/2019 19:14   MR HAND RIGHT WO CONTRAST  Result Date: 08/09/2019 CLINICAL DATA:  Hand pain swelling. EXAM: MRI OF THE RIGHT HAND WITHOUT CONTRAST TECHNIQUE: Multiplanar, multisequence MR imaging of the right hand was performed. No intravenous contrast was administered. COMPARISON:  Radiographs 08/03/2019 FINDINGS: Focal inflammatory changes around the third MCP joint with soft tissue swelling/edema mainly around the dorsum of the hand. Somewhat ill-defined fluid collection measures 14 mm and is superficial to the extensor tendon. The does appear to be some inflammatory tenosynovitis also. A small joint effusion is noted but I do not see any definite MR  findings for septic arthritis or osteomyelitis. The flexor tendons appear normal. The radial and ulnar collateral ligaments are intact. No findings for diffuse cellulitis or myofasciitis. IMPRESSION: 1. MR focal inflammatory changes around the third MCP joints as discussed above. Possible ill-defined superficial abscess dorsally. 2. No findings for septic arthritis or osteomyelitis. 3. No findings for diffuse cellulitis or myofasciitis. Electronically Signed   By: Marijo Sanes M.D.   On: 08/09/2019 06:21   [place] EKG SITE RITE  Result Date: 08/07/2019 If Site Rite image not attached, placement could not be confirmed due to current cardiac rhythm.  [place] EKG SITE RITE  Result Date: 08/07/2019 If Site Rite image not attached, placement could not be confirmed due to current cardiac rhythm.    Medical Consultants:    None.  Anti-Infectives:   IV vancomycin  Subjective:    Leslie Molina.  She relates her pain is controlled.  Objective:    Vitals:   08/09/19 0528 08/09/19 0544 08/09/19 0629 08/09/19 0733  BP: 105/64  (!) 114/59 120/63  Pulse:   (!) 104 (!) 105  Resp: (!) 22  20 18   Temp:   98.6 F (37 C) 99.3 F (37.4 C)  TempSrc:   Oral Oral  SpO2:  95% 96% 98%  Weight:  68.1 kg    Height:       SpO2: 98 % O2 Flow Rate (L/min): 3 L/min   Intake/Output Summary (Last 24 hours) at 08/09/2019 0856 Last data filed at 08/09/2019 0451 Gross per 24 hour  Intake 911.42 ml  Output 4850 ml  Net -3938.58 ml   Filed Weights   08/06/19 0355 08/07/19 0500 08/09/19 0544  Weight: 73.6  kg 73.5 kg 68.1 kg    Exam: General exam: In no acute distress. Respiratory system: Good air movement and clear to auscultation. Cardiovascular system: S1 & S2 heard, RRR. No JVD, murmurs, rubs, gallops or clicks.  Gastrointestinal system: Abdomen is nondistended, soft and nontender.  Central nervous system: Alert and oriented. No focal neurological deficits. Extremities: No pedal edema. Skin: No rashes,  lesions or ulcers Psychiatry: Judgement and insight appear normal. Mood & affect appropriate.    Data Reviewed:    Labs: Basic Metabolic Panel: Recent Labs  Lab 08/03/19 1847 08/03/19 1847 08/04/19 0500 08/04/19 0500 08/05/19 0530 08/05/19 0530 08/06/19 0541 08/06/19 0541 08/07/19 0615 08/08/19 0339  NA 134*   < > 136  --  135  --  135  --  139 135  K 2.9*   < > 2.8*   < > 3.1*   < > 3.1*   < > 4.6 4.1  CL 102   < > 102  --  98  --  96*  --  106 102  CO2 22   < > 23  --  25  --  28  --  25 24  GLUCOSE 121*   < > 111*  --  149*  --  141*  --  112* 148*  BUN 6   < > 5*  --  <5*  --  <5*  --  <5* 7  CREATININE 0.55   < > 0.54  --  0.58  --  0.54  --  0.36* 0.45  CALCIUM 7.6*   < > 7.7*  --  7.6*  --  7.7*  --  7.8* 8.0*  MG 1.9  --   --   --  1.8  --  1.9  --  2.1 2.0  PHOS 2.2*  --   --   --   --   --   --   --   --   --    < > = values in this interval not displayed.   GFR Estimated Creatinine Clearance: 87.4 mL/min (by C-G formula based on SCr of 0.45 mg/dL). Liver Function Tests: Recent Labs  Lab 08/03/19 0250 08/05/19 0530 08/06/19 0541 08/07/19 0615 08/08/19 0339  AST 24 56* 57* 29 35  ALT 18 36 48* 42 53*  ALKPHOS 83 121 127* 106 122  BILITOT 0.5 0.5 0.5 0.7 0.3  PROT 6.3* 5.2* 5.6* 5.4* 5.7*  ALBUMIN 2.3* 1.7* 1.8* 1.7* 1.7*   No results for input(s): LIPASE, AMYLASE in the last 168 hours. No results for input(s): AMMONIA in the last 168 hours. Coagulation profile Recent Labs  Lab 08/03/19 0250  INR 1.3*   COVID-19 Labs  No results for input(s): DDIMER, FERRITIN, LDH, CRP in the last 72 hours.  Lab Results  Component Value Date   SARSCOV2NAA NEGATIVE 08/02/2019   [place] NEGATIVE 07/29/2019    CBC: Recent Labs  Lab 08/03/19 1847 08/03/19 1847 08/04/19 0500 08/05/19 0530 08/06/19 0541 08/07/19 0615 08/08/19 0339  WBC 12.1*   < > 13.0* 11.0* 11.0* 13.3* 11.9*  NEUTROABS 10.5*  --   --   --   --   --   --   HGB 9.7*   < > 9.7* 9.5*  10.4* 10.1* 10.0*  HCT 28.3*   < > 29.3* 28.2* 31.5* 31.6* 30.8*  MCV 83.7   < > 84.7 84.4 85.1 87.1 86.5  PLT 280   < > 303 386 437* 425* 464*   < > =  values in this interval not displayed.   Cardiac Enzymes: Recent Labs  Lab 08/04/19 0500  CKTOTAL 104   BNP (last 3 results) No results for input(s): PROBNP in the last 8760 hours. CBG: Recent Labs  Lab 08/02/19 2113  GLUCAP 127*   D-Dimer: No results for input(s): DDIMER in the last 72 hours. Hgb A1c: No results for input(s): HGBA1C in the last 72 hours. Lipid Profile: No results for input(s): CHOL, HDL, LDLCALC, TRIG, CHOLHDL, LDLDIRECT in the last 72 hours. Thyroid function studies: No results for input(s): TSH, T4TOTAL, T3FREE, THYROIDAB in the last 72 hours.  Invalid input(s): FREET3 Anemia work up: No results for input(s): VITAMINB12, FOLATE, FERRITIN, TIBC, IRON, RETICCTPCT in the last 72 hours. Sepsis Labs: Recent Labs  Lab 08/02/19 1130 08/03/19 0250 08/03/19 1847 08/04/19 0500 08/05/19 0530 08/06/19 0541 08/07/19 0615 08/08/19 0339  PROCALCITON  --  5.99  --   --   --   --   --   --   WBC  --   --  12.1*   < > 11.0* 11.0* 13.3* 11.9*  LATICACIDVEN 1.4  --  0.8  --   --   --   --   --    < > = values in this interval not displayed.   Microbiology Recent Results (from the past 240 hour(s))  Respiratory Panel by RT PCR (Flu A&B, Covid) - Nasopharyngeal Swab     Status: None   Collection Time: 08/02/19 11:20 AM   Specimen: Nasopharyngeal Swab  Result Value Ref Range Status   SARS Coronavirus 2 by RT PCR NEGATIVE NEGATIVE Final    Comment: (NOTE) SARS-CoV-2 target nucleic acids are NOT DETECTED. The SARS-CoV-2 RNA is generally detectable in upper respiratoy specimens during the acute phase of infection. The lowest concentration of SARS-CoV-2 viral copies this assay can detect is 131 copies/mL. A negative result does not preclude SARS-Cov-2 infection and should not be used as the sole basis for  treatment or other patient management decisions. A negative result may occur with  improper specimen collection/handling, submission of specimen other than nasopharyngeal swab, presence of viral mutation(s) within the areas targeted by this assay, and inadequate number of viral copies (<131 copies/mL). A negative result must be combined with clinical observations, patient history, and epidemiological information. The expected result is Negative. Fact Sheet for Patients:  PinkCheek.be Fact Sheet for Healthcare Providers:  GravelBags.it This test is not yet ap proved or cleared by the [place] FDA and  has been authorized for detection and/or diagnosis of SARS-CoV-2 by FDA under an Emergency Use Authorization (EUA). This EUA will remain  in effect (meaning this test can be used) for the duration of the COVID-19 declaration under Section 564(b)(1) of the Act, 21 U.S.C. section 360bbb-3(b)(1), unless the authorization is terminated or revoked sooner.    Influenza A by PCR NEGATIVE NEGATIVE Final   Influenza B by PCR NEGATIVE NEGATIVE Final    Comment: (NOTE) The Xpert Xpress SARS-CoV-2/FLU/RSV assay is intended as an aid in  the diagnosis of influenza from Nasopharyngeal swab specimens and  should not be used as a sole basis for treatment. Nasal washings and  aspirates are unacceptable for Xpert Xpress SARS-CoV-2/FLU/RSV  testing. Fact Sheet for Patients: PinkCheek.be Fact Sheet for Healthcare Providers: GravelBags.it This test is not yet approved or cleared by the [place] FDA and  has been authorized for detection and/or diagnosis of SARS-CoV-2 by  FDA under an Emergency Use Authorization (EUA). This EUA will  remain  in effect (meaning this test can be used) for the duration of the  Covid-19 declaration under Section 564(b)(1) of the Act, 21  U.S.C. section  360bbb-3(b)(1), unless the authorization is  terminated or revoked. Performed at [facility], [address] [place], [place] [address]   Blood culture (routine x 2)     Status: Abnormal   Collection Time: 08/02/19 11:20 AM   Specimen: BLOOD  Result Value Ref Range Status   Specimen Description   Final    BLOOD RIGHT ANTECUBITAL Performed at [facility], [address], [place], [place] [address]    Special Requests   Final    BOTTLES DRAWN AEROBIC AND ANAEROBIC Blood Culture adequate volume Performed at [facility], [address] [place], [place] [address]    Culture  Setup Time   Final    IN BOTH AEROBIC AND ANAEROBIC BOTTLES GRAM POSITIVE COCCI CRITICAL RESULT CALLED TO, READ BACK BY AND VERIFIED WITH: [place] @2022  08/02/19 [place] Performed at [facility], [address] [place], [place] [address]    Culture METHICILLIN RESISTANT STAPHYLOCOCCUS AUREUS (A)  Final   Report Status 08/05/2019 FINAL  Final   Organism ID, Bacteria METHICILLIN RESISTANT STAPHYLOCOCCUS AUREUS  Final      Susceptibility   Methicillin resistant staphylococcus aureus - MIC*    CIPROFLOXACIN >=8 RESISTANT Resistant     ERYTHROMYCIN >=8 RESISTANT Resistant     GENTAMICIN <=0.5 SENSITIVE Sensitive     OXACILLIN >=4 RESISTANT Resistant     TETRACYCLINE <=1 SENSITIVE Sensitive     VANCOMYCIN <=0.5 SENSITIVE Sensitive     TRIMETH/SULFA <=10 SENSITIVE Sensitive     CLINDAMYCIN <=0.25 SENSITIVE Sensitive     RIFAMPIN <=0.5 SENSITIVE Sensitive     Inducible Clindamycin NEGATIVE Sensitive     * METHICILLIN RESISTANT STAPHYLOCOCCUS AUREUS  Blood culture (routine x 2)     Status: Abnormal   Collection Time: 08/02/19 11:20 AM   Specimen: BLOOD  Result Value Ref Range Status   Specimen Description   Final    BLOOD BLOOD RIGHT ARM Performed at [facility], [address], [place], [place] [address]    Special Requests   Final    BOTTLES DRAWN AEROBIC  AND ANAEROBIC Blood Culture adequate volume Performed at [facility], [address] [place], [place] [address]    Culture  Setup Time   Final    IN BOTH AEROBIC AND ANAEROBIC BOTTLES GRAM POSITIVE COCCI CRITICAL RESULT CALLED TO, READ BACK BY AND VERIFIED WITH: [place] @0019  08/03/19 [place]    Culture (A)  Final    STAPHYLOCOCCUS AUREUS SUSCEPTIBILITIES PERFORMED ON PREVIOUS CULTURE WITHIN THE LAST 5 DAYS. Performed at [facility], [place] [address] [place], [place] [address]    Report Status 08/05/2019 FINAL  Final  Blood Culture ID Panel (Reflexed)     Status: Abnormal   Collection Time: 08/02/19 11:20 AM  Result Value Ref Range Status   Enterococcus species NOT DETECTED NOT DETECTED Final   Listeria monocytogenes NOT DETECTED NOT DETECTED Final   Staphylococcus species DETECTED (A) NOT DETECTED Final    Comment: CRITICAL RESULT CALLED TO, READ BACK BY AND VERIFIED WITH: [place] @0019  08/03/19 AKT    Staphylococcus aureus (BCID) DETECTED (A) NOT DETECTED Final    Comment: Methicillin (oxacillin)-resistant Staphylococcus aureus (MRSA). MRSA is predictably resistant to beta-lactam antibiotics (except ceftaroline). Preferred therapy is vancomycin unless clinically contraindicated. Patient requires contact precautions if  hospitalized. CRITICAL RESULT CALLED TO, READ BACK BY AND VERIFIED WITH: SCOTT HALL @0019  08/03/19 AKT    Methicillin resistance DETECTED (A) NOT DETECTED Final    Comment: CRITICAL RESULT CALLED TO, READ BACK BY AND VERIFIED WITH: SCOTT HALL @0019  08/03/19 AKT    Streptococcus species NOT DETECTED NOT DETECTED Final   Streptococcus agalactiae NOT DETECTED NOT DETECTED Final   Streptococcus pneumoniae NOT DETECTED NOT DETECTED Final   Streptococcus pyogenes NOT DETECTED NOT DETECTED Final   Acinetobacter baumannii NOT DETECTED NOT DETECTED Final   Enterobacteriaceae species NOT DETECTED NOT DETECTED Final   Enterobacter cloacae complex NOT DETECTED  NOT DETECTED Final   Escherichia coli NOT DETECTED NOT DETECTED Final   Klebsiella oxytoca NOT DETECTED NOT DETECTED Final   Klebsiella pneumoniae NOT DETECTED NOT DETECTED Final   Proteus species NOT DETECTED NOT DETECTED Final   Serratia marcescens NOT DETECTED NOT DETECTED Final   Haemophilus influenzae NOT DETECTED NOT DETECTED Final   Neisseria meningitidis NOT DETECTED NOT DETECTED Final   Pseudomonas aeruginosa NOT DETECTED NOT DETECTED Final   Candida albicans NOT DETECTED NOT DETECTED Final   Candida glabrata NOT DETECTED NOT DETECTED Final   Candida krusei NOT DETECTED NOT DETECTED Final   Candida parapsilosis NOT DETECTED NOT DETECTED Final   Candida tropicalis NOT DETECTED NOT DETECTED Final    Comment: Performed at [facility], [address] [place], [place] [address]  Aerobic/Anaerobic Culture (surgical/deep wound)     Status: None   Collection Time: 08/02/19  5:29 PM   Specimen: Abscess  Result Value Ref Range Status   Specimen Description   Final    ABSCESS Performed at [facility], [place] [place], [place] [address]    Special Requests ILIACUS MUSCLE  Final   Gram Stain   Final    MODERATE WBC PRESENT,BOTH PMN AND MONONUCLEAR ABUNDANT GRAM POSITIVE COCCI IN PAIRS IN CLUSTERS    Culture   Final    ABUNDANT METHICILLIN RESISTANT STAPHYLOCOCCUS AUREUS NO ANAEROBES ISOLATED Performed at [facility], [place] [address] [place], [place] [address]    Report Status 08/07/2019 FINAL  Final   Organism ID, Bacteria METHICILLIN RESISTANT STAPHYLOCOCCUS AUREUS  Final      Susceptibility   Methicillin resistant staphylococcus aureus - MIC*    CIPROFLOXACIN >=8 RESISTANT Resistant     ERYTHROMYCIN >=8 RESISTANT Resistant     GENTAMICIN <=0.5 SENSITIVE Sensitive     OXACILLIN >=4 RESISTANT Resistant     TETRACYCLINE <=1 SENSITIVE Sensitive     VANCOMYCIN 1 SENSITIVE Sensitive     TRIMETH/SULFA <=10 SENSITIVE Sensitive     CLINDAMYCIN  <=0.25 SENSITIVE Sensitive     RIFAMPIN <=0.5 SENSITIVE Sensitive     Inducible Clindamycin NEGATIVE Sensitive     * ABUNDANT METHICILLIN RESISTANT STAPHYLOCOCCUS AUREUS  MRSA PCR Screening     Status: Abnormal   Collection Time: 08/02/19  8:24 PM   Specimen: Nasal Mucosa; Nasopharyngeal  Result Value Ref Range Status   MRSA by PCR POSITIVE (A) NEGATIVE Final    Comment:        The GeneXpert MRSA Assay (FDA approved for NASAL specimens only), is one component of a comprehensive MRSA colonization surveillance program. It is not intended to diagnose MRSA infection nor to guide or monitor treatment for MRSA infections. RESULT CALLED TO, READ BACK BY AND VERIFIED WITH: Gypsy Lore 08/02/19 @ 2143  [facility] Performed at [facility], [address] [place], [place] [address]  CULTURE, BLOOD (ROUTINE X 2) w Reflex to ID Panel     Status: Abnormal   Collection Time: 08/03/19 10:17 AM   Specimen: BLOOD  Result Value Ref Range Status   Specimen Description   Final    BLOOD LEFT ANTECUBITAL Performed at [facility], [address] [place], [place] [address]    Special Requests   Final    BOTTLES DRAWN AEROBIC AND ANAEROBIC Blood Culture adequate volume Performed at [facility], [address], [place], [place] [address]    Culture  Setup Time   Final    GRAM POSITIVE COCCI IN BOTH AEROBIC AND ANAEROBIC BOTTLES CRITICAL VALUE NOTED.  VALUE IS CONSISTENT WITH PREVIOUSLY REPORTED AND CALLED VALUE. Performed at [facility], [address] [place], [place] [address]    Culture (A)  Final    STAPHYLOCOCCUS AUREUS SUSCEPTIBILITIES PERFORMED ON PREVIOUS CULTURE WITHIN THE LAST 5 DAYS. Performed at [facility], [place] [address] [place], [place] [address]    Report Status 08/06/2019 FINAL  Final  CULTURE, BLOOD (ROUTINE X 2) w Reflex to ID Panel     Status: Abnormal   Collection Time: 08/03/19 10:32 AM   Specimen: BLOOD  Result Value Ref  Range Status   Specimen Description   Final    BLOOD BLOOD LEFT HAND Performed at [facility], [address], [place], [place] [address]    Special Requests   Final    BOTTLES DRAWN AEROBIC AND ANAEROBIC Blood Culture adequate volume Performed at [facility], [address] [place], [place] [address]    Culture  Setup Time   Final    GRAM POSITIVE COCCI IN BOTH AEROBIC AND ANAEROBIC BOTTLES CRITICAL RESULT CALLED TO, READ BACK BY AND VERIFIED WITH: [place] [id number] 08/04/19 H[place] Performed at [facility], [address] [place], [place] [address]    Culture (A)  Final    STAPHYLOCOCCUS AUREUS SUSCEPTIBILITIES PERFORMED ON PREVIOUS CULTURE WITHIN THE LAST 5 DAYS. Performed at [facility], [place] [address], [place], [place] [address]    Report Status 08/06/2019 FINAL  Final  Culture, blood (Routine X 2) w Reflex to ID Panel     Status: None (Preliminary result)   Collection Time: 08/05/19  7:18 AM   Specimen: BLOOD  Result Value Ref Range Status   Specimen Description BLOOD LEFT ANTECUBITAL  Final   Special Requests   Final    BOTTLES DRAWN AEROBIC AND ANAEROBIC Blood Culture adequate volume   Culture   Final    NO GROWTH 4 DAYS Performed at [facility], [place] [address] [place], [place] [address]    Report Status PENDING  Incomplete  Culture, blood (Routine X 2) w Reflex to ID Panel     Status: None (Preliminary result)   Collection Time: 08/05/19  7:19 AM   Specimen: BLOOD LEFT HAND  Result Value Ref Range Status   Specimen Description BLOOD LEFT HAND  Final   Special Requests   Final    BOTTLES DRAWN AEROBIC AND ANAEROBIC Blood Culture adequate volume   Culture   Final    NO GROWTH 4 DAYS Performed at [facility], [place] [address] [place], [place] [address]    Report Status PENDING  Incomplete     Medications:   . Chlorhexidine Gluconate Cloth  6 each Topical Daily  . docusate sodium  100 mg Oral BID  .  enoxaparin (LOVENOX) injection  40 mg Subcutaneous Q24H  . polyethylene glycol  17 g Oral Daily  . sodium chloride flush  10-40 mL Intracatheter Q12H  . traZODone  150 mg Oral QHS   Continuous Infusions: . sodium chloride 250 mL (08/08/19 1839)  . vancomycin Stopped (08/09/19 0451)      LOS: 6 days   Charlynne Cousins  [facility]  08/09/2019, 8:56 AM

## 2019-08-09 NOTE — Progress Notes (Addendum)
Late Entry for 08/08/19 @ 2031: Patient seen and assessed. Patient found resting in bed. Physical assessment completed per [facility] policy. New PICC (single lumen) noted. Flushed with 10 ml Normal Saline with positive blood return noted. Cooling blanket noted. No family present at bedside. Assisted to bedside commode for an attempt at bowel movement. Linens on bed readjusted. Sanitary napkin soaked with urine and changed. Staff nurse makes suggestion to discontinue purwick and use bedside commode. Patient states that she needs purwick use at night and uses bedside commode during the day. Patient ambulates back to bed with standby assistance. Siderails up x 4. Call light in reach.   Rapid Response Nurse Communication 03.10.21 @ 0459: Update given to Waunita Schooner, staff Rapid Response Nurse. Staff nurse expresses primary concern related to most recent low BP and Red MEWS. Explained that RED Mews are second to patient elevated temp. Instructed to notify primary care team  MD Correspondence 03.10.21 @ [phone]. On call provider notified of most recent RED Mews second to elevated temp and low BP.

## 2019-08-09 NOTE — Progress Notes (Signed)
Leslie Molina for Infectious Disease  Date of Admission:  08/03/2019     Total days of antibiotics 8        ASSESSMENT:  Leslie Molina continues to have pain in her right shoulder and right hip. Awaiting IR aspiration and drain placement. She continues to have fevers albeit slightly lower. Renal function stable with no evidence of nephrotoxicity. Continue current dose of vancomycin. Discussed pain management with Leslie Molina and she will continue to work with her primary team.   PLAN:  1. Continue vancomycin 2. Monitor renal function while on vancomycin at least twice weekly.  3. Await IR aspiration of empyema.   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 Chlorhexidine Gluconate Cloth  6 each Topical Daily  . docusate sodium  100 mg Oral BID  . enoxaparin (LOVENOX) injection  40 mg Subcutaneous Q24H  . polyethylene glycol  17 g Oral Daily  . sodium chloride flush  10-40 mL Intracatheter Q12H  . traZODone  150 mg Oral QHS    SUBJECTIVE:  Febrile overnight with temperature of 102.3. CT chest performed with small loculated pleural effusion at the right lung apex and scatted nodular densities throughout both lungs. CVTS recommends CT guided drain.    No Known Allergies   Review of Systems: Review of Systems  Constitutional: Negative for chills, fever and weight loss.  Respiratory: Negative for cough, shortness of breath and wheezing.   Cardiovascular: Negative for chest pain and leg swelling.  Gastrointestinal: Negative for abdominal pain, constipation, diarrhea, nausea and vomiting.  Skin: Negative for rash.      OBJECTIVE: Vitals:   08/09/19 0733 08/09/19 0915 08/09/19 1025 08/09/19 1133  BP: 120/63   108/61  Pulse: (!) 105   96  Resp: 18   18  Temp: 99.3 F (37.4 C) 100 F (37.8 C) 99.6 F (37.6 C)  99.4 F (37.4 C)  TempSrc: Oral Other (Comment) Other (Comment) Oral  SpO2: 98%   98%  Weight:      Height:       Body mass index is 28.38 kg/m.  Physical Exam Constitutional:      General: She is not in acute distress.    Appearance: She is well-developed.  Cardiovascular:     Rate and Rhythm: Normal rate and regular rhythm.     Heart sounds: Normal heart sounds.  Pulmonary:     Effort: Pulmonary effort is normal.     Breath sounds: Normal breath sounds.  Skin:    General: Skin is warm and dry.  Neurological:     Mental Status: She is alert and oriented to person, place, and time.  Psychiatric:        Behavior: Behavior normal.        Thought Content: Thought content normal.        Judgment: Judgment normal.     Lab Results Lab Results  Component Value Date   WBC 11.9 (H) 08/08/2019   HGB 10.0 (L) 08/08/2019   HCT 30.8 (L) 08/08/2019   MCV 86.5 08/08/2019   PLT 464 (H) 08/08/2019    Lab Results  Component Value Date   CREATININE 0.45 08/08/2019   BUN 7 08/08/2019   NA 135 08/08/2019   K 4.1 08/08/2019   CL 102 08/08/2019  CO2 24 08/08/2019    Lab Results  Component Value Date   ALT 53 (H) 08/08/2019   AST 35 08/08/2019   ALKPHOS 122 08/08/2019   BILITOT 0.3 08/08/2019     Microbiology: Recent Results (from the past 240 hour(s))  Respiratory Panel by RT PCR (Flu A&B, Covid) - Nasopharyngeal Swab     Status: None   Collection Time: 08/02/19 11:20 AM   Specimen: Nasopharyngeal Swab  Result Value Ref Range Status   SARS Coronavirus 2 by RT PCR NEGATIVE NEGATIVE Final    Comment: (NOTE) SARS-CoV-2 target nucleic acids are NOT DETECTED. The SARS-CoV-2 RNA is generally detectable in upper respiratoy specimens during the acute phase of infection. The lowest concentration of SARS-CoV-2 viral copies this assay can detect is 131 copies/mL. A negative result does not preclude SARS-Cov-2 infection and should not be used as the sole basis for treatment  or other patient management decisions. A negative result may occur with  improper specimen collection/handling, submission of specimen other than nasopharyngeal swab, presence of viral mutation(s) within the areas targeted by this assay, and inadequate number of viral copies (<131 copies/mL). A negative result must be combined with clinical observations, patient history, and epidemiological information. The expected result is Negative. Fact Sheet for Patients:  PinkCheek.be Fact Sheet for Healthcare Providers:  GravelBags.it This test is not yet ap proved or cleared by the [place] FDA and  has been authorized for detection and/or diagnosis of SARS-CoV-2 by FDA under an Emergency Use Authorization (EUA). This EUA will remain  in effect (meaning this test can be used) for the duration of the COVID-19 declaration under Section 564(b)(1) of the Act, 21 U.S.C. section 360bbb-3(b)(1), unless the authorization is terminated or revoked sooner.    Influenza A by PCR NEGATIVE NEGATIVE Final   Influenza B by PCR NEGATIVE NEGATIVE Final    Comment: (NOTE) The Xpert Xpress SARS-CoV-2/FLU/RSV assay is intended as an aid in  the diagnosis of influenza from Nasopharyngeal swab specimens and  should not be used as a sole basis for treatment. Nasal washings and  aspirates are unacceptable for Xpert Xpress SARS-CoV-2/FLU/RSV  testing. Fact Sheet for Patients: PinkCheek.be Fact Sheet for Healthcare Providers: GravelBags.it This test is not yet approved or cleared by the [place] FDA and  has been authorized for detection and/or diagnosis of SARS-CoV-2 by  FDA under an Emergency Use Authorization (EUA). This EUA will remain  in effect (meaning this test can be used) for the duration of the  Covid-19 declaration under Section 564(b)(1) of the Act, 21  U.S.C. section  360bbb-3(b)(1), unless the authorization is  terminated or revoked. Performed at [facility], [address] [place], [place] [address]   Blood culture (routine x 2)     Status: Abnormal   Collection Time: 08/02/19 11:20 AM   Specimen: BLOOD  Result Value Ref Range Status   Specimen Description   Final    BLOOD RIGHT ANTECUBITAL Performed at [facility], [address], [place], [place] [address]    Special Requests   Final    BOTTLES DRAWN AEROBIC AND ANAEROBIC Blood Culture adequate volume Performed at [facility], [address] [place], [place] [address]    Culture  Setup Time   Final    IN BOTH AEROBIC AND ANAEROBIC BOTTLES GRAM POSITIVE COCCI CRITICAL RESULT CALLED TO, READ BACK BY AND VERIFIED WITH: [place] @2022  08/02/19 [facility] Performed at [facility], [address] [place], [place] [address]  Culture METHICILLIN RESISTANT STAPHYLOCOCCUS AUREUS (A)  Final   Report Status 08/05/2019 FINAL  Final   Organism ID, Bacteria METHICILLIN RESISTANT STAPHYLOCOCCUS AUREUS  Final      Susceptibility   Methicillin resistant staphylococcus aureus - MIC*    CIPROFLOXACIN >=8 RESISTANT Resistant     ERYTHROMYCIN >=8 RESISTANT Resistant     GENTAMICIN <=0.5 SENSITIVE Sensitive     OXACILLIN >=4 RESISTANT Resistant     TETRACYCLINE <=1 SENSITIVE Sensitive     VANCOMYCIN <=0.5 SENSITIVE Sensitive     TRIMETH/SULFA <=10 SENSITIVE Sensitive     CLINDAMYCIN <=0.25 SENSITIVE Sensitive     RIFAMPIN <=0.5 SENSITIVE Sensitive     Inducible Clindamycin NEGATIVE Sensitive     * METHICILLIN RESISTANT STAPHYLOCOCCUS AUREUS  Blood culture (routine x 2)     Status: Abnormal   Collection Time: 08/02/19 11:20 AM   Specimen: BLOOD  Result Value Ref Range Status   Specimen Description   Final    BLOOD BLOOD RIGHT ARM Performed at [facility], [address], [place], [place] [address]    Special Requests   Final    BOTTLES DRAWN AEROBIC  AND ANAEROBIC Blood Culture adequate volume Performed at [facility], [address] [place], [place] [address]    Culture  Setup Time   Final    IN BOTH AEROBIC AND ANAEROBIC BOTTLES GRAM POSITIVE COCCI CRITICAL RESULT CALLED TO, READ BACK BY AND VERIFIED WITH: [place] @0019  08/03/19 [facility]    Culture (A)  Final    STAPHYLOCOCCUS AUREUS SUSCEPTIBILITIES PERFORMED ON PREVIOUS CULTURE WITHIN THE LAST 5 DAYS. Performed at [facility], [place] [address], [place], [place] [address]    Report Status 08/05/2019 FINAL  Final  Blood Culture ID Panel (Reflexed)     Status: Abnormal   Collection Time: 08/02/19 11:20 AM  Result Value Ref Range Status   Enterococcus species NOT DETECTED NOT DETECTED Final   Listeria monocytogenes NOT DETECTED NOT DETECTED Final   Staphylococcus species DETECTED (A) NOT DETECTED Final    Comment: CRITICAL RESULT CALLED TO, READ BACK BY AND VERIFIED WITH: [place] @0019  08/03/19 [facility]    Staphylococcus aureus (BCID) DETECTED (A) NOT DETECTED Final    Comment: Methicillin (oxacillin)-resistant Staphylococcus aureus (MRSA). MRSA is predictably resistant to beta-lactam antibiotics (except ceftaroline). Preferred therapy is vancomycin unless clinically contraindicated. Patient requires contact precautions if  hospitalized. CRITICAL RESULT CALLED TO, READ BACK BY AND VERIFIED WITH: SCOTT HALL @0019  08/03/19 [facility]    Methicillin resistance DETECTED (A) NOT DETECTED Final    Comment: CRITICAL RESULT CALLED TO, READ BACK BY AND VERIFIED WITH: [place] @0019  08/03/19 [facility]    Streptococcus species NOT DETECTED NOT DETECTED Final   Streptococcus agalactiae NOT DETECTED NOT DETECTED Final   Streptococcus pneumoniae NOT DETECTED NOT DETECTED Final   Streptococcus pyogenes NOT DETECTED NOT DETECTED Final   Acinetobacter baumannii NOT DETECTED NOT DETECTED Final   Enterobacteriaceae species NOT DETECTED NOT DETECTED Final   Enterobacter cloacae complex NOT DETECTED  NOT DETECTED Final   Escherichia coli NOT DETECTED NOT DETECTED Final   Klebsiella oxytoca NOT DETECTED NOT DETECTED Final   Klebsiella pneumoniae NOT DETECTED NOT DETECTED Final   Proteus species NOT DETECTED NOT DETECTED Final   Serratia marcescens NOT DETECTED NOT DETECTED Final   Haemophilus influenzae NOT DETECTED NOT DETECTED Final   Neisseria meningitidis NOT DETECTED NOT DETECTED Final   Pseudomonas aeruginosa NOT DETECTED NOT DETECTED Final   Candida albicans NOT DETECTED  NOT DETECTED Final   Candida glabrata NOT DETECTED NOT DETECTED Final   Candida krusei NOT DETECTED NOT DETECTED Final   Candida parapsilosis NOT DETECTED NOT DETECTED Final   Candida tropicalis NOT DETECTED NOT DETECTED Final    Comment: Performed at [facility], [address] [place], [place] [address]  Aerobic/Anaerobic Culture (surgical/deep wound)     Status: None   Collection Time: 08/02/19  5:29 PM   Specimen: Abscess  Result Value Ref Range Status   Specimen Description   Final    ABSCESS Performed at [facility], [address] [place], [place] [address]    Special Requests ILIACUS MUSCLE  Final   Gram Stain   Final    MODERATE WBC PRESENT,BOTH PMN AND MONONUCLEAR ABUNDANT GRAM POSITIVE COCCI IN PAIRS IN CLUSTERS    Culture   Final    ABUNDANT METHICILLIN RESISTANT STAPHYLOCOCCUS AUREUS NO ANAEROBES ISOLATED Performed at [facility], [address] [place], [place] [address]    Report Status 08/07/2019 FINAL  Final   Organism ID, Bacteria METHICILLIN RESISTANT STAPHYLOCOCCUS AUREUS  Final      Susceptibility   Methicillin resistant staphylococcus aureus - MIC*    CIPROFLOXACIN >=8 RESISTANT Resistant     ERYTHROMYCIN >=8 RESISTANT Resistant     GENTAMICIN <=0.5 SENSITIVE Sensitive     OXACILLIN >=4 RESISTANT Resistant     TETRACYCLINE <=1 SENSITIVE Sensitive     VANCOMYCIN 1 SENSITIVE Sensitive     TRIMETH/SULFA <=10 SENSITIVE Sensitive     CLINDAMYCIN  <=0.25 SENSITIVE Sensitive     RIFAMPIN <=0.5 SENSITIVE Sensitive     Inducible Clindamycin NEGATIVE Sensitive     * ABUNDANT METHICILLIN RESISTANT STAPHYLOCOCCUS AUREUS  MRSA PCR Screening     Status: Abnormal   Collection Time: 08/02/19  8:24 PM   Specimen: Nasal Mucosa; Nasopharyngeal  Result Value Ref Range Status   MRSA by PCR POSITIVE (A) NEGATIVE Final    Comment:        The GeneXpert MRSA Assay (FDA approved for NASAL specimens only), is one component of a comprehensive MRSA colonization surveillance program. It is not intended to diagnose MRSA infection nor to guide or monitor treatment for MRSA infections. RESULT CALLED TO, READ BACK BY AND VERIFIED WITH: Gypsy Lore 08/02/19 @ 2143  [place] Performed at [facility], [address] [place], [place] [address]   CULTURE, BLOOD (ROUTINE X 2) w Reflex to ID Panel     Status: Abnormal   Collection Time: 08/03/19 10:17 AM   Specimen: BLOOD  Result Value Ref Range Status   Specimen Description   Final    BLOOD LEFT ANTECUBITAL Performed at [facility], [address], [place], [place] [address]    Special Requests   Final    BOTTLES DRAWN AEROBIC AND ANAEROBIC Blood Culture adequate volume Performed at [facility], [address], [place], [place] [address]    Culture  Setup Time   Final    GRAM POSITIVE COCCI IN BOTH AEROBIC AND ANAEROBIC BOTTLES CRITICAL VALUE NOTED.  VALUE IS CONSISTENT WITH PREVIOUSLY REPORTED AND CALLED VALUE. Performed at [facility], [address] [place], [place] [address]    Culture (A)  Final    STAPHYLOCOCCUS AUREUS SUSCEPTIBILITIES PERFORMED ON PREVIOUS CULTURE WITHIN THE LAST 5 DAYS. Performed at [facility], [place] [address], [place], [place] [address]    Report Status 08/06/2019 FINAL  Final  CULTURE, BLOOD (ROUTINE X 2) w  Reflex to ID Panel     Status: Abnormal   Collection Time: 08/03/19 10:32 AM   Specimen: BLOOD  Result Value Ref  Range Status   Specimen Description   Final    BLOOD BLOOD LEFT HAND Performed at [facility], [address], [place], [place] [address]    Special Requests   Final    BOTTLES DRAWN AEROBIC AND ANAEROBIC Blood Culture adequate volume Performed at [facility], [address] [place], [place] [address]    Culture  Setup Time   Final    GRAM POSITIVE COCCI IN BOTH AEROBIC AND ANAEROBIC BOTTLES CRITICAL RESULT CALLED TO, READ BACK BY AND VERIFIED WITH: Hart Robinsons [facility] [id number] 08/04/19 [facility] Performed at [facility], [address] [place], [place] [address]    Culture (A)  Final    STAPHYLOCOCCUS AUREUS SUSCEPTIBILITIES PERFORMED ON PREVIOUS CULTURE WITHIN THE LAST 5 DAYS. Performed at [facility], [place] [address], [place], [place] [address]    Report Status 08/06/2019 FINAL  Final  Culture, blood (Routine X 2) w Reflex to ID Panel     Status: None (Preliminary result)   Collection Time: 08/05/19  7:18 AM   Specimen: BLOOD  Result Value Ref Range Status   Specimen Description BLOOD LEFT ANTECUBITAL  Final   Special Requests   Final    BOTTLES DRAWN AEROBIC AND ANAEROBIC Blood Culture adequate volume   Culture   Final    NO GROWTH 4 DAYS Performed at [facility], [place] [address] [place], [place] [address]    Report Status PENDING  Incomplete  Culture, blood (Routine X 2) w Reflex to ID Panel     Status: None (Preliminary result)   Collection Time: 08/05/19  7:19 AM   Specimen: BLOOD LEFT HAND  Result Value Ref Range Status   Specimen Description BLOOD LEFT HAND  Final   Special Requests   Final    BOTTLES DRAWN AEROBIC AND ANAEROBIC Blood Culture adequate volume   Culture   Final    NO GROWTH 4 DAYS Performed at [facility], [place] [address], [place], [place] [address]    Report Status PENDING  Incomplete     Terri Piedra, [place] for [place] [facility] [phone]  Pager  08/09/2019  2:03 PM

## 2019-08-09 NOTE — [place] Notes (Signed)
Pharmacy Antibiotic Note  Leslie Molina is a 34 y.o. female admitted with MRSA bacteremia/abscess w/ osteo ofof iliac bone and sacrum.  Pharmacy dosing vancomycin.   Day 8 vancomycin. PICC line placed 3/9. Pt is febrile overnight, Tmax 101.2, likely due to possible empyema. CTS following, ordered CT-guided drain.  3/10 Vanc peak 30, vanc trough 11. Estimated AUC 523. SCr stable.   Plan: Continue vancomycin 1500 IV q8h  F/u clinical improvement, renal function, and vanc levels as needed   Height: 5\' 1"  (154.9 cm) Weight: 150 lb 3.2 oz (68.1 kg) IBW/kg (Calculated) : 47.8  Temp (24hrs), Avg:100.9 F (38.3 C), Min:98.6 F (37 C), Max:103.1 F (39.5 C)  Recent Labs  Lab 08/02/19 1130 08/03/19 0250 08/03/19 1847 08/03/19 1847 08/04/19 0500 08/05/19 0100 08/05/19 0529 08/05/19 0530 08/06/19 0541 08/06/19 1750 08/06/19 2100 08/07/19 0615 08/08/19 0339 08/09/19 0500 08/09/19 0959  WBC  --   --  12.1*   < > 13.0*  --   --  11.0* 11.0*  --   --  13.3* 11.9*  --   --   CREATININE  --    < > 0.55   < > 0.54  --   --  0.58 0.54  --   --  0.36* 0.45  --   --   LATICACIDVEN 1.4  --  0.8  --   --   --   --   --   --   --   --   --   --   --   --   VANCOTROUGH  --   --   --   --   --    < >   < >  --   --   --  7*  --   --   --  11*  VANCOPEAK  --   --   --   --   --    < >  --   --   --  19*  --   --   --  30  --    < > = values in this interval not displayed.    Estimated Creatinine Clearance: 87.4 mL/min (by C-G formula based on SCr of 0.45 mg/dL).    No Known Allergies  Antimicrobials this admission: 3/3 metronidazole x 1 3/3 vancomycin >>  3/3 cefepime >>3/5 3/4 daptomycin 500mg  x 1   Microbiology results: 3/3 BCx: GPC clusters 4/4 bottles  (BCID = MRSA) 3/3 iliopsoas abscess: MRSA   Berenice Bouton, PharmD PGY1 Pharmacy Resident  Please check AMION for all [place] phone numbers After 10:00 PM, call [place] [phone]  08/09/2019 11:23 AM

## 2019-08-10 ENCOUNTER — Inpatient Hospital Stay (HOSPITAL_COMMUNITY): Payer: Medicaid Other

## 2019-08-10 LAB — CULTURE, BLOOD (ROUTINE X 2)
Culture: NO GROWTH
Culture: NO GROWTH
Special Requests: ADEQUATE
Special Requests: ADEQUATE

## 2019-08-10 LAB — CREATININE, SERUM
Creatinine, Ser: 0.5 mg/dL (ref 0.44–1.00)
GFR calc Af Amer: >60 mL/min (ref >60)
GFR calc non Af Amer: >60 mL/min (ref >60)

## 2019-08-10 LAB — CK: Total CK: 37 U/L — ABNORMAL LOW (ref 38–234)

## 2019-08-10 IMAGING — CT CT ABD-PELV W/ CM
2 of 4 series · 13 of 36 positions shown, 16 images · IV contrast (Omni 300)
Comparison: None.

[DATE].[DATE].

CLINICAL DATA: Right ileo psoas abscess.  Empyema.

EXAM:
CT CHEST, ABDOMEN, AND PELVIS WITH CONTRAST
TECHNIQUE: Multidetector CT imaging of the chest, abdomen and pelvis was
performed following the standard protocol during bolus
administration of intravenous contrast.
CONTRAST:  100mL OMNIPAQUE IOHEXOL 300 MG/ML  SOLN
80 mL OMNIPAQUE IOHEXOL 300 MG/ML  SOLN

[Series 3: cap with 5mm st · axial · 0.89mm/px · z∈[+860,+1380]mm · 10 of 124 slices shown, 13 images]
[im 10/124  mediastinal]
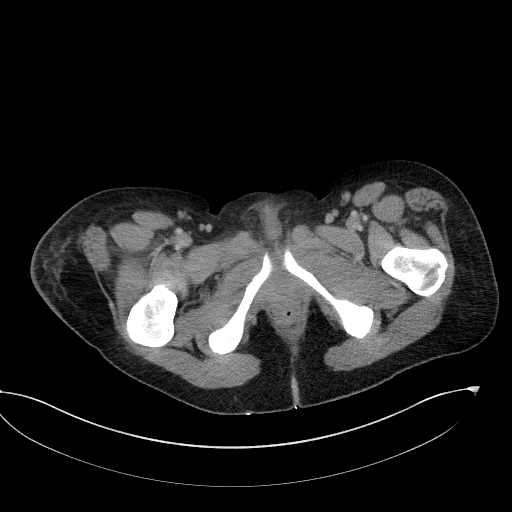
[im 10/124  lung]
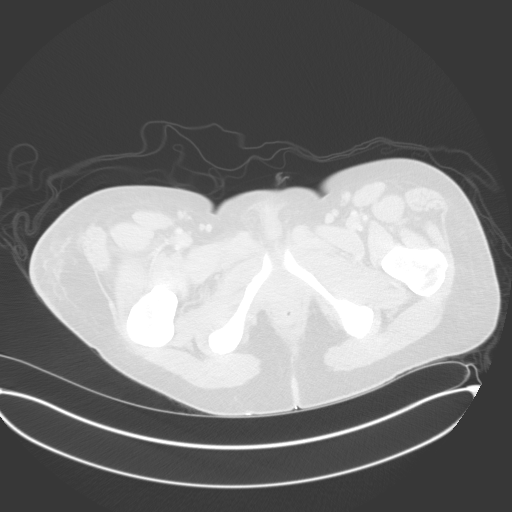
[im 19/124  lung]
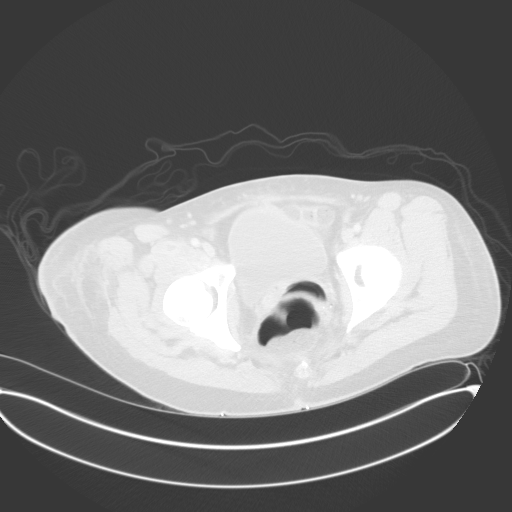
[im 38/124  lung]
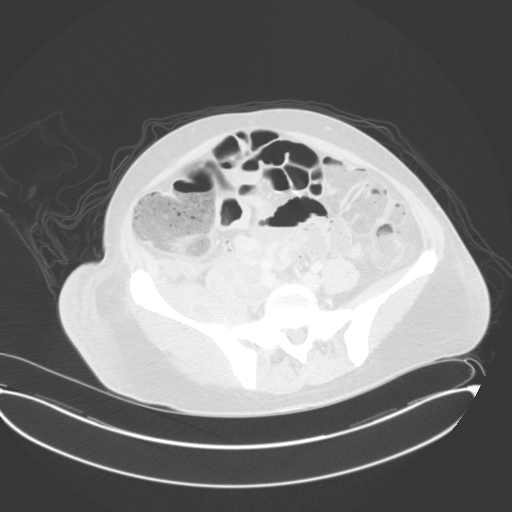
[im 48/124  lung]
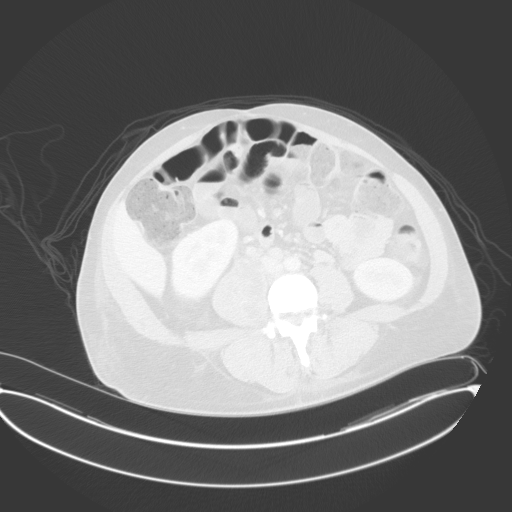
[im 57/124  mediastinal]
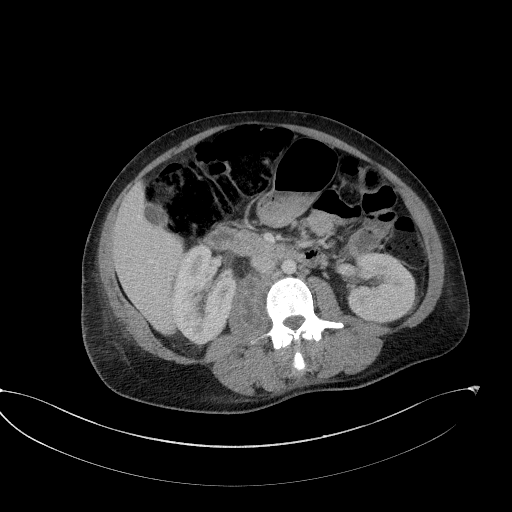
[im 57/124  lung]
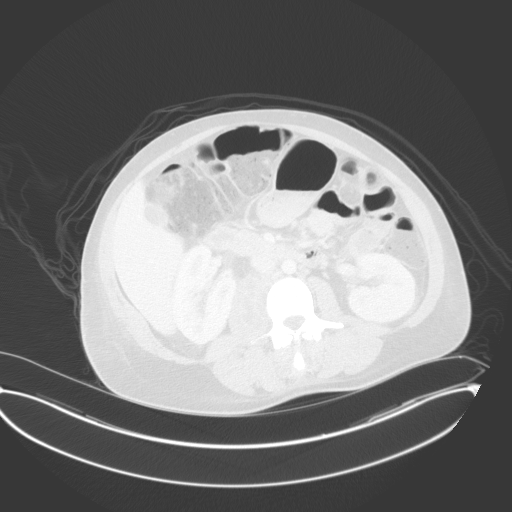
[im 67/124  lung]
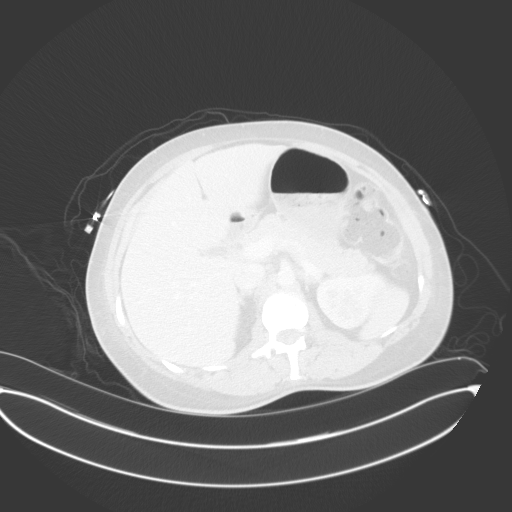
[im 76/124  lung]
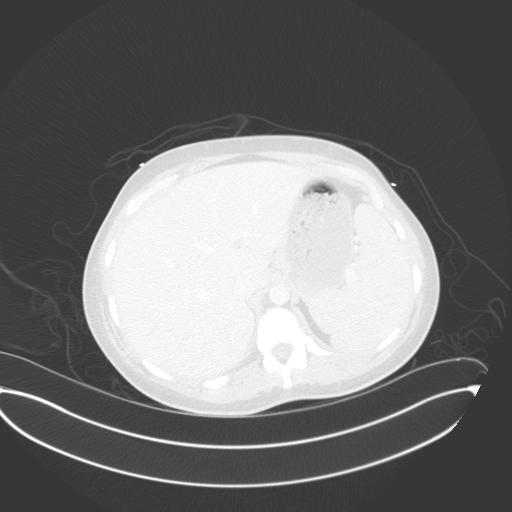
[im 95/124  lung]
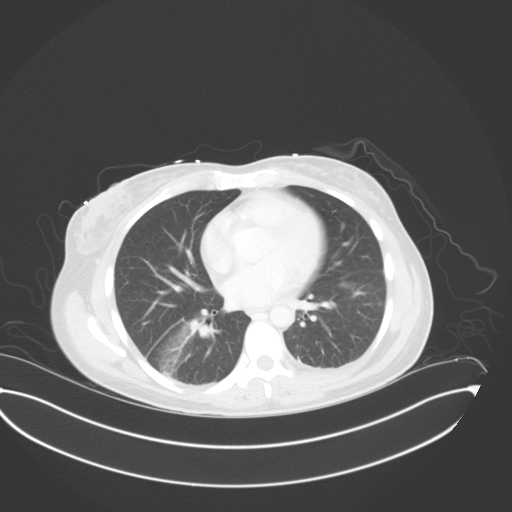
[im 105/124  mediastinal]
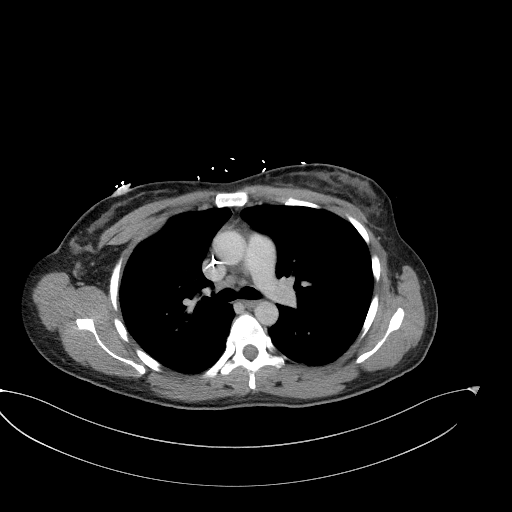
[im 105/124  lung]
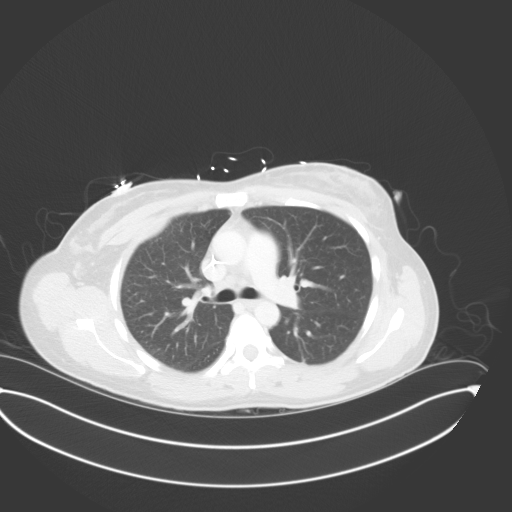
[im 114/124  lung]
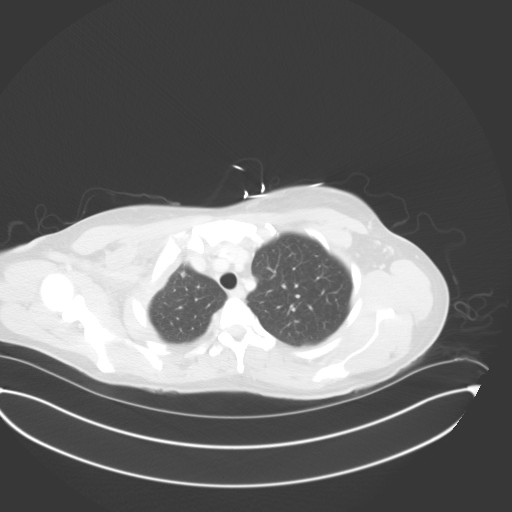

[Series 6: cap with 3mm st cor · coronal · 0.84mm/px · 3 of 168 slices shown]
[im 34/168  lung]
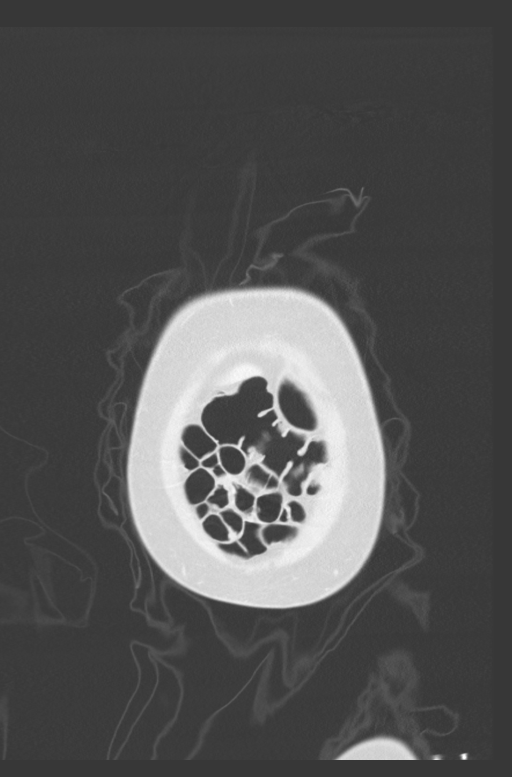
[im 67/168  lung]
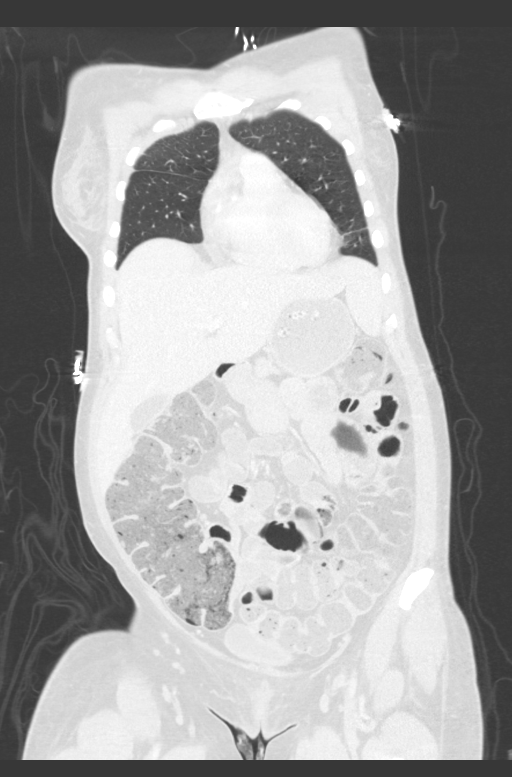
[im 101/168  lung]
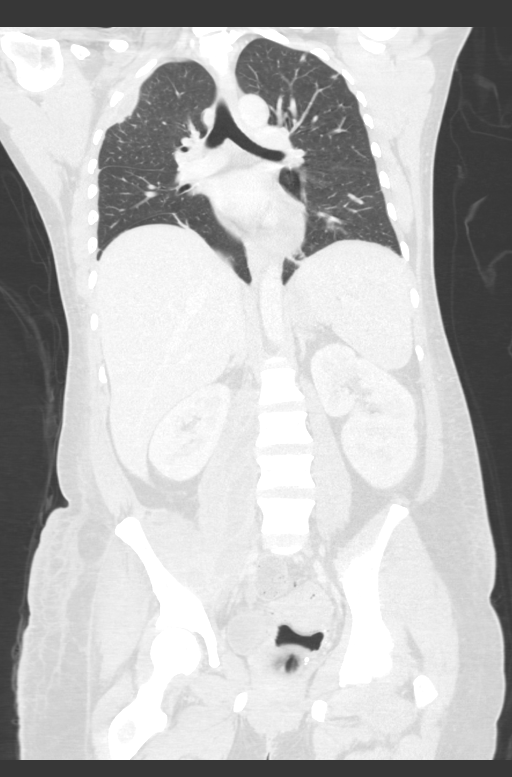

[13 of 36 positions shown; findings below may reference images not displayed]

FINDINGS: CT ABDOMEN PELVIS FINDINGS

Hepatobiliary: No focal liver abnormality is seen. No gallstones,
gallbladder wall thickening, or biliary dilatation.

Pancreas: Unremarkable. No pancreatic ductal dilatation or
surrounding inflammatory changes.

Spleen: Normal in size without focal abnormality.

Adrenals/Urinary Tract: Adrenal glands are unremarkable. Kidneys are
normal, without renal calculi, focal lesion, or hydronephrosis.
Bladder is unremarkable.

Stomach/Bowel: Stomach is within normal limits. Appendix appears
normal. No evidence of bowel wall thickening, distention, or
inflammatory changes.

Vascular/Lymphatic: No significant vascular findings are present. No
enlarged abdominal or pelvic lymph nodes.

Reproductive: Intrauterine device is noted. No adnexal abnormality
is noted.

Other: No abdominal wall hernia or abnormality. No abdominopelvic
ascites.

Musculoskeletal: There is significant enlargement of the right ileo
psoas muscle with multiple irregular peripherally enhancing low
densities throughout its course concerning for multiple abscesses.
This is seen as far distally as its insertion with the proximal
femur. There is also noted a 3.0 x 2.4 cm fluid collection seen in
the gluteus medius muscle on the right. 20 x 15 mm fluid collection
is noted in right iliacus muscle.

Chest:

Cardiovascular: No significant vascular findings. Normal heart size.
Small pericardial effusion.

Mediastinum/Nodes: No enlarged mediastinal, hilar, or axillary lymph
nodes. Thyroid gland, trachea, and esophagus demonstrate no
significant findings.

Lungs/Pleura: Grossly stable appearance of probable small loculated
pleural effusion seen along the lateral and anterior aspect of the
right upper lobe. It currently measures 8.6 x 2.7 cm. No
pneumothorax is noted. Right posterior atelectasis or infiltrate is
noted. Mild left basilar atelectasis is noted. Stable small
bilateral lung opacities are noted, most likely infectious in
etiology.
IMPRESSION: Grossly stable loculated effusion or empyema is seen involving the
anterior and lateral portion of the right upper lobe.

Grossly stable small bilateral lung opacities are noted, most likely
infectious in etiology.

Right posterior atelectasis or infiltrate is noted.

Continued significant enlargement of right ileo psoas muscle with
multiple irregular and peripherally enhancing low densities
throughout its course, most consistent with abscesses. Probable
abscesses are also noted in the right iliacus muscle as well as in
the right gluteus medius muscle.

## 2019-08-10 MED ORDER — LORAZEPAM 2 MG/ML IJ SOLN
1.0000 mg | Freq: Once | INTRAMUSCULAR | Status: AC
  Administered 2019-08-10: 1 mg via INTRAVENOUS
  Filled 2019-08-10: qty 1

## 2019-08-10 MED ORDER — LINEZOLID 600 MG/300ML IV SOLN: 600.0000 mg | Freq: Two times a day (BID) | INTRAVENOUS | Status: DC

## 2019-08-10 MED ORDER — IOHEXOL 300 MG/ML  SOLN
100.0000 mL | Freq: Once | INTRAMUSCULAR | Status: AC | PRN
  Administered 2019-08-10: 100 mL via INTRAVENOUS

## 2019-08-10 MED ORDER — SODIUM CHLORIDE 0.9 % IV SOLN
600.0000 mg | Freq: Two times a day (BID) | INTRAVENOUS | Status: DC
  Administered 2019-08-10: 600 mg via INTRAVENOUS
  Filled 2019-08-10 (×3): qty 600

## 2019-08-10 MED ORDER — ENOXAPARIN SODIUM 40 MG/0.4ML ~~LOC~~ SOLN
40.0000 mg | SUBCUTANEOUS | Status: DC
  Administered 2019-08-11: 40 mg via SUBCUTANEOUS
  Filled 2019-08-10: qty 0.4

## 2019-08-10 MED ORDER — SODIUM CHLORIDE 0.9 % IV SOLN
500.0000 mg | Freq: Every day | INTRAVENOUS | Status: DC
  Administered 2019-08-10 – 2019-08-13 (×4): 500 mg via INTRAVENOUS
  Filled 2019-08-10 (×5): qty 10

## 2019-08-10 NOTE — Progress Notes (Signed)
[facility] PROGRESS NOTE    Progress Note  Leslie Molina  [id number] DOB: 07/26/1985 DOA: 08/03/2019 PCP: [facility]     Brief Narrative:   Leslie Molina is an 34 y.o. 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  The abscess extends to the posterior aspect of the proximal right thigh.  Culture data: COVID 3/3 >negative  MRSA PCR 3/3 >Positive  Blood culture 3/3 >MRSA  Pelvic abcess culture 3/3 >Abundant Staph aures, full result pending.  Antibiotic regimen: Daptomycin 3/3 once Cefepime 3/3-3/4 Clindamycin 3/3 once Vancomycin 3/3>>   Assessment/Plan:   Sepsis secondary to MRSA bacteremia and iliopsoas abscess/osteomyelitis of the sacrum/myositis and empyema: CT of the neck on 08/06/2019 showed multiple peripheral predominant nodules at the lung apices suspicious for septic emboli and a right empyema with inflammation and stranding on the right retroclavicular region and right lower neck consistent with soft tissue infection. Blood cultures from 08/03/2019 show staph aureus repeated blood cultures from 08/05/2019 showed no growth over 20. Patient continues to spike fever, along with tachycardia with ongoing IV vancomycin. Patient continues to have fever and tachycardia infectious disease was consulted recommended IV vancomycin.  As she continues to spike fevers we will repeat an MRI of the hip. IR was consulted to perform a ultrasound-guided right thoracocentesis the aspirated 13 mL of your purulent fluid CT of the chest showed multiloculated effusion in the right apex increase in size, with scatter nodules. Cardiothoracic surgery was consulted Dr.  Orvan Seen did VATS with drainage of the empyema and possible decortication.  Iliopsoas/gluteal abscess/osteomyelitis of the sacrum and possible infection of the right dorsum hand and right sternoclavicular joint infection: MRI of the hip and lumbar spine as below. She is currently on IV Dilaudid and oral oxycodone. As she continues to spike fevers we will repeat an MRI of the right hip, will ask IR if we can aspirate  this abscess.  Peritonitis/IUD perforation: CT scan of the abdomen her abdominal exam is benign. Gynecology and general surgery was consulted and they have signed off. They recommended no surgical intervention at this time, no surgical intervention at this time.  Hypokalemia: Repleted orally now resolved.  Bipolar disorder/anxiety/depression: Home medications Cymbalta, Seroquel trazodone were held.   DVT prophylaxis: heparin Family Communication:none Disposition Plan/Barrier to D/C: She will probably be discharged when she has completed her course of IV antibiotic she is not a candidate to go home with a PICC line and 6 weeks of IV antibiotics.  Code Status:     Code Status Orders  (From admission, onward)         Start     Ordered   08/03/19 1729  Full code  Continuous     08/03/19 1729        Code Status History    Date Active Date Inactive Code Status Order ID Comments User Context   08/02/2019 1859 08/03/2019 1708 Full Code [id number]  Collier Bullock, MD ED   Advance Care Planning Activity        IV Access:    Peripheral IV   Procedures and diagnostic studies:   DG Chest 1 View  Result Date: 08/09/2019 CLINICAL DATA:  Status  post right thoracentesis EXAM: CHEST  1 VIEW COMPARISON:  08/03/2019 FINDINGS: Cardiac shadow is within normal limits. The lungs are well aerated. No pneumothorax is noted following right thoracentesis. No sizable effusion is seen. No bony abnormality is noted. IMPRESSION: No evidence of pneumothorax. Electronically Signed   By:  Inez Catalina M.D.   On: 08/09/2019 17:14   CT CHEST WO CONTRAST  Result Date: 08/08/2019 CLINICAL DATA:  Empyema. Sepsis secondary to MRSA bacteremia. Leukocytosis, tachycardia and tachypnea. EXAM: CT CHEST WITHOUT CONTRAST TECHNIQUE: Multidetector CT imaging of the chest was performed following the standard protocol without IV contrast. COMPARISON:  Chest radiograph 08/03/2019, chest CT 08/02/2019 FINDINGS: Cardiovascular: Left upper extremity PICC in place. No aortic aneurysm. Heart is normal in size. Small pericardial effusion measuring 12 mm in depth inferiorly. Mediastinum/Nodes: Small mediastinal lymph nodes not enlarged by size criteria. Limited assessment for hilar adenopathy given lack of IV contrast. No esophageal wall thickening. Lungs/Pleura: Dependent consolidation in right lower lobe has air bronchograms, suspicious for pneumonia. Minimal dependent right pleural thickening without frank effusion. Loculated pleural fluid at the anterior right upper lobe measures up to 3 cm, increased since prior. Multiple small pulmonary nodules and nodular densities are new from prior exam, likely infectious. No cavitary lesions. Streaky opacities in the dependent left lower lobe, likely atelectasis. Upper Abdomen: No acute findings on noncontrast exam. Moderate volume of stool in the included colon. Musculoskeletal: Soft tissue thickening involving the right first and second intercostal muscles again seen, less well-defined currently in the absence of IV contrast. There is soft tissue edema in the adjacent right axilla, no subcutaneous fluid collection. No periosteal reaction or bony destructive change. IMPRESSION: 1. Small loculated pleural effusion at the right lung apex adjacent to thickening of the intercostal muscles, slightly increased in size from CT 6 days ago. 2. Scattered nodular densities throughout both lungs, also new, likely infectious. No definite cavitary lesions. Dependent right lower lobe  consolidation consistent with pneumonia. Minimal right pleural thickening without sub pulmonic effusion. 3. Small pericardial effusion. Electronically Signed   By: Keith Rake M.D.   On: 08/08/2019 19:14   MR HAND RIGHT WO CONTRAST  Result Date: 08/09/2019 CLINICAL DATA:  Hand pain swelling. EXAM: MRI OF THE RIGHT HAND WITHOUT CONTRAST TECHNIQUE: Multiplanar, multisequence MR imaging of the right hand was performed. No intravenous contrast was administered. COMPARISON:  Radiographs 08/03/2019 FINDINGS: Focal inflammatory changes around the third MCP joint with soft tissue swelling/edema mainly around the dorsum of the hand. Somewhat ill-defined fluid collection measures 14 mm and is superficial to the extensor tendon. The does appear to be some inflammatory tenosynovitis also. A small joint effusion is noted but I do not see any definite MR findings for septic arthritis or osteomyelitis. The flexor tendons appear normal. The radial and ulnar collateral ligaments are intact. No findings for diffuse cellulitis or myofasciitis. IMPRESSION: 1. MR focal inflammatory changes around the third MCP joints as discussed above. Possible ill-defined superficial abscess dorsally. 2. No findings for septic arthritis or osteomyelitis. 3. No findings for diffuse cellulitis or myofasciitis. Electronically Signed   By: Marijo Sanes M.D.   On: 08/09/2019 06:21   IR THORACENTESIS ASP PLEURAL SPACE W/IMG GUIDE  Result Date: 08/09/2019 INDICATION: 34 year old with a small right chest empyema. EXAM: ULTRASOUND GUIDED RIGHT THORACENTESIS MEDICATIONS: None. COMPLICATIONS: None immediate. PROCEDURE: An ultrasound guided thoracentesis was thoroughly discussed with the patient and questions answered. The benefits, risks, alternatives and complications were also discussed. The patient understands and wishes to  proceed with the procedure. Written consent was obtained. Ultrasound was used to evaluate the right upper chest. Small  complex pleural-based collection was identified in the right upper chest. Skin was prepped with chlorhexidine and sterile field was created. Skin was anesthetized with 1% lidocaine. A 19 gauge, 7 cm Yueh catheter was directed into the small collection under real-time ultrasound guidance. Approximately 13 mL of thick yellow purulent fluid was aspirated. Fluid was collected and sent for culture. Bandage placed over the puncture site. FINDINGS: Small irregular pleural-based collection in the anterior right upper chest. This collection corresponds with the small pleural-based collection on recent CT imaging. Yueh catheter was successfully placed within the collection and 13 mL of thick yellow purulent fluid was aspirated. Collection was slightly smaller based on ultrasound after the fluid had been aspirated. IMPRESSION: Successful ultrasound guided right thoracentesis yielding 13 mL of purulent fluid from the small right chest empyema. Electronically Signed   By: Markus Daft M.D.   On: 08/09/2019 18:27     Medical Consultants:    None.  Anti-Infectives:   IV vancomycin  Subjective:    Leslie Molina.  Relates her chest pain is controlled, but she continues to spike fevers and feels tired and fatigued.  Objective:    Vitals:   08/09/19 2010 08/10/19 0009 08/10/19 0534 08/10/19 0800  BP: (!) 98/59 (!) 92/58 (!) 93/57 93/63  Pulse:    (!) 101  Resp: 20 18 18 16   Temp: (!) 102.2 F (39 C) (!) 101.2 F (38.4 C) (!) 96.8 F (36 C) 98 F (36.7 C)  TempSrc: Rectal Oral Oral Rectal  SpO2: 98% 99% 99% 99%  Weight:   66.6 kg   Height:       SpO2: 99 % O2 Flow Rate (L/min): 3 L/min   Intake/Output Summary (Last 24 hours) at 08/10/2019 0926 Last data filed at 08/10/2019 0102 Gross per 24 hour  Intake 490 ml  Output 3700 ml  Net -3210 ml   Filed Weights   08/07/19 0500 08/09/19 0544 08/10/19 0534  Weight: 73.5 kg 68.1 kg 66.6 kg    Exam: General exam: In no acute distress. Respiratory  system: Good air movement and clear to auscultation. Cardiovascular system: S1 & S2 heard, RRR. No JVD. Gastrointestinal system: Abdomen is nondistended, soft and nontender.  Extremities: No pedal edema. Skin: Multiple tattoos and piercing. Psychiatry: Judgement and insight appear normal.  Data Reviewed:    Labs: Basic Metabolic Panel: Recent Labs  Lab 08/03/19 1847 08/03/19 1847 08/04/19 0500 08/04/19 0500 08/05/19 0530 08/05/19 0530 08/06/19 0541 08/06/19 0541 08/07/19 0615 08/08/19 0339 08/10/19 0500  NA 134*   < > 136  --  135  --  135  --  139 135  --   K 2.9*   < > 2.8*   < > 3.1*   < > 3.1*   < > 4.6 4.1  --   CL 102   < > 102  --  98  --  96*  --  106 102  --   CO2 22   < > 23  --  25  --  28  --  25 24  --   GLUCOSE 121*   < > 111*  --  149*  --  141*  --  112* 148*  --   BUN 6   < > 5*  --  <5*  --  <5*  --  <5* 7  --   CREATININE 0.55   < >  0.54   < > 0.58  --  0.54  --  0.36* 0.45 0.50  CALCIUM 7.6*   < > 7.7*  --  7.6*  --  7.7*  --  7.8* 8.0*  --   MG 1.9  --   --   --  1.8  --  1.9  --  2.1 2.0  --   PHOS 2.2*  --   --   --   --   --   --   --   --   --   --    < > = values in this interval not displayed.   GFR Estimated Creatinine Clearance: 86.5 mL/min (by C-G formula based on SCr of 0.5 mg/dL). Liver Function Tests: Recent Labs  Lab 08/05/19 0530 08/06/19 0541 08/07/19 0615 08/08/19 0339  AST 56* 57* 29 35  ALT 36 48* 42 53*  ALKPHOS 121 127* 106 122  BILITOT 0.5 0.5 0.7 0.3  PROT 5.2* 5.6* 5.4* 5.7*  ALBUMIN 1.7* 1.8* 1.7* 1.7*   No results for input(s): LIPASE, AMYLASE in the last 168 hours. No results for input(s): AMMONIA in the last 168 hours. Coagulation profile No results for input(s): INR, PROTIME in the last 168 hours. COVID-19 Labs  No results for input(s): DDIMER, FERRITIN, LDH, CRP in the last 72 hours.  Lab Results  Component Value Date   [place] NEGATIVE 08/02/2019   [place] NEGATIVE 07/29/2019    CBC: Recent  Labs  Lab 08/03/19 1847 08/03/19 1847 08/04/19 0500 08/05/19 0530 08/06/19 0541 08/07/19 0615 08/08/19 0339  WBC 12.1*   < > 13.0* 11.0* 11.0* 13.3* 11.9*  NEUTROABS 10.5*  --   --   --   --   --   --   HGB 9.7*   < > 9.7* 9.5* 10.4* 10.1* 10.0*  HCT 28.3*   < > 29.3* 28.2* 31.5* 31.6* 30.8*  MCV 83.7   < > 84.7 84.4 85.1 87.1 86.5  PLT 280   < > 303 386 437* 425* 464*   < > = values in this interval not displayed.   Cardiac Enzymes: Recent Labs  Lab 08/04/19 0500  CKTOTAL 104   BNP (last 3 results) No results for input(s): PROBNP in the last 8760 hours. CBG: No results for input(s): GLUCAP in the last 168 hours. D-Dimer: No results for input(s): DDIMER in the last 72 hours. Hgb A1c: No results for input(s): HGBA1C in the last 72 hours. Lipid Profile: No results for input(s): CHOL, HDL, LDLCALC, TRIG, CHOLHDL, LDLDIRECT in the last 72 hours. Thyroid function studies: No results for input(s): TSH, T4TOTAL, T3FREE, THYROIDAB in the last 72 hours.  Invalid input(s): FREET3 Anemia work up: No results for input(s): VITAMINB12, FOLATE, FERRITIN, TIBC, IRON, RETICCTPCT in the last 72 hours. Sepsis Labs: Recent Labs  Lab 08/03/19 1847 08/04/19 0500 08/05/19 0530 08/06/19 0541 08/07/19 0615 08/08/19 0339  WBC 12.1*   < > 11.0* 11.0* 13.3* 11.9*  LATICACIDVEN 0.8  --   --   --   --   --    < > = values in this interval not displayed.   Microbiology Recent Results (from the past 240 hour(s))  Respiratory Panel by RT PCR (Flu A&B, Covid) - Nasopharyngeal Swab     Status: None   Collection Time: 08/02/19 11:20 AM   Specimen: Nasopharyngeal Swab  Result Value Ref Range Status   SARS Coronavirus 2 by RT PCR NEGATIVE NEGATIVE Final    Comment: (NOTE) SARS-CoV-2  target nucleic acids are NOT DETECTED. The SARS-CoV-2 RNA is generally detectable in upper respiratoy specimens during the acute phase of infection. The lowest concentration of SARS-CoV-2 viral copies this assay  can detect is 131 copies/mL. A negative result does not preclude SARS-Cov-2 infection and should not be used as the sole basis for treatment or other patient management decisions. A negative result may occur with  improper specimen collection/handling, submission of specimen other than nasopharyngeal swab, presence of viral mutation(s) within the areas targeted by this assay, and inadequate number of viral copies (<131 copies/mL). A negative result must be combined with clinical observations, patient history, and epidemiological information. The expected result is Negative. Fact Sheet for Patients:  PinkCheek.be Fact Sheet for Healthcare Providers:  GravelBags.it This test is not yet ap proved or cleared by the [place] FDA and  has been authorized for detection and/or diagnosis of SARS-CoV-2 by FDA under an Emergency Use Authorization (EUA). This EUA will remain  in effect (meaning this test can be used) for the duration of the COVID-19 declaration under Section 564(b)(1) of the Act, 21 U.S.C. section 360bbb-3(b)(1), unless the authorization is terminated or revoked sooner.    Influenza A by PCR NEGATIVE NEGATIVE Final   Influenza B by PCR NEGATIVE NEGATIVE Final    Comment: (NOTE) The Xpert Xpress SARS-CoV-2/FLU/RSV assay is intended as an aid in  the diagnosis of influenza from Nasopharyngeal swab specimens and  should not be used as a sole basis for treatment. Nasal washings and  aspirates are unacceptable for Xpert Xpress SARS-CoV-2/FLU/RSV  testing. Fact Sheet for Patients: PinkCheek.be Fact Sheet for Healthcare Providers: GravelBags.it This test is not yet approved or cleared by the [place] FDA and  has been authorized for detection and/or diagnosis of SARS-CoV-2 by  FDA under an Emergency Use Authorization (EUA). This EUA will remain  in effect  (meaning this test can be used) for the duration of the  Covid-19 declaration under Section 564(b)(1) of the Act, 21  U.S.C. section 360bbb-3(b)(1), unless the authorization is  terminated or revoked. Performed at [facility], [address] [place], [place] [address]   Blood culture (routine x 2)     Status: Abnormal   Collection Time: 08/02/19 11:20 AM   Specimen: BLOOD  Result Value Ref Range Status   Specimen Description   Final    BLOOD RIGHT ANTECUBITAL Performed at [facility], [address] [place], [place] [address]    Special Requests   Final    BOTTLES DRAWN AEROBIC AND ANAEROBIC Blood Culture adequate volume Performed at [facility], [address] [place], [place] [address]    Culture  Setup Time   Final    IN BOTH AEROBIC AND ANAEROBIC BOTTLES GRAM POSITIVE COCCI CRITICAL RESULT CALLED TO, READ BACK BY AND VERIFIED WITH: [place] @2022  08/02/19 [place] Performed at [facility], [address] [place], [place] [address]    Culture METHICILLIN RESISTANT STAPHYLOCOCCUS AUREUS (A)  Final   Report Status 08/05/2019 FINAL  Final   Organism ID, Bacteria METHICILLIN RESISTANT STAPHYLOCOCCUS AUREUS  Final      Susceptibility   Methicillin resistant staphylococcus aureus - MIC*    CIPROFLOXACIN >=8 RESISTANT Resistant     ERYTHROMYCIN >=8 RESISTANT Resistant     GENTAMICIN <=0.5 SENSITIVE Sensitive     OXACILLIN >=4 RESISTANT Resistant     TETRACYCLINE <=1 SENSITIVE Sensitive     VANCOMYCIN <=0.5 SENSITIVE Sensitive     TRIMETH/SULFA <=10 SENSITIVE  Sensitive     CLINDAMYCIN <=0.25 SENSITIVE Sensitive     RIFAMPIN <=0.5 SENSITIVE Sensitive     Inducible Clindamycin NEGATIVE Sensitive     * METHICILLIN RESISTANT STAPHYLOCOCCUS AUREUS  Blood culture (routine x 2)     Status: Abnormal   Collection Time: 08/02/19 11:20 AM   Specimen: BLOOD  Result Value Ref Range Status   Specimen Description   Final    BLOOD BLOOD RIGHT  ARM Performed at [facility], [address] [place], [place] [address]    Special Requests   Final    BOTTLES DRAWN AEROBIC AND ANAEROBIC Blood Culture adequate volume Performed at [facility], [address] [place], [place] [address]    Culture  Setup Time   Final    IN BOTH AEROBIC AND ANAEROBIC BOTTLES GRAM POSITIVE COCCI CRITICAL RESULT CALLED TO, READ BACK BY AND VERIFIED WITH: SCOTT HALL @0019  08/03/19 [place]    Culture (A)  Final    STAPHYLOCOCCUS AUREUS SUSCEPTIBILITIES PERFORMED ON PREVIOUS CULTURE WITHIN THE LAST 5 DAYS. Performed at [facility], [place] [address] [place], [place] [address]    Report Status 08/05/2019 FINAL  Final  Blood Culture ID Panel (Reflexed)     Status: Abnormal   Collection Time: 08/02/19 11:20 AM  Result Value Ref Range Status   Enterococcus species NOT DETECTED NOT DETECTED Final   Listeria monocytogenes NOT DETECTED NOT DETECTED Final   Staphylococcus species DETECTED (A) NOT DETECTED Final    Comment: CRITICAL RESULT CALLED TO, READ BACK BY AND VERIFIED WITH: SCOTT HALL @0019  08/03/19 AKT    Staphylococcus aureus (BCID) DETECTED (A) NOT DETECTED Final    Comment: Methicillin (oxacillin)-resistant Staphylococcus aureus (MRSA). MRSA is predictably resistant to beta-lactam antibiotics (except ceftaroline). Preferred therapy is vancomycin unless clinically contraindicated. Patient requires contact precautions if  hospitalized. CRITICAL RESULT CALLED TO, READ BACK BY AND VERIFIED WITH: SCOTT HALL @0019  08/03/19 AKT    Methicillin resistance DETECTED (A) NOT DETECTED Final    Comment: CRITICAL RESULT CALLED TO, READ BACK BY AND VERIFIED WITH: SCOTT HALL @0019  08/03/19 AKT    Streptococcus species NOT DETECTED NOT DETECTED Final   Streptococcus agalactiae NOT DETECTED NOT DETECTED Final   Streptococcus pneumoniae NOT DETECTED NOT DETECTED Final   Streptococcus pyogenes NOT DETECTED NOT DETECTED Final   Acinetobacter  baumannii NOT DETECTED NOT DETECTED Final   Enterobacteriaceae species NOT DETECTED NOT DETECTED Final   Enterobacter cloacae complex NOT DETECTED NOT DETECTED Final   Escherichia coli NOT DETECTED NOT DETECTED Final   Klebsiella oxytoca NOT DETECTED NOT DETECTED Final   Klebsiella pneumoniae NOT DETECTED NOT DETECTED Final   Proteus species NOT DETECTED NOT DETECTED Final   Serratia marcescens NOT DETECTED NOT DETECTED Final   Haemophilus influenzae NOT DETECTED NOT DETECTED Final   Neisseria meningitidis NOT DETECTED NOT DETECTED Final   Pseudomonas aeruginosa NOT DETECTED NOT DETECTED Final   Candida albicans NOT DETECTED NOT DETECTED Final   Candida glabrata NOT DETECTED NOT DETECTED Final   Candida krusei NOT DETECTED NOT DETECTED Final   Candida parapsilosis NOT DETECTED NOT DETECTED Final   Candida tropicalis NOT DETECTED NOT DETECTED Final    Comment: Performed at [facility], [address] [place], [place] [address]  Aerobic/Anaerobic Culture (surgical/deep wound)     Status: None   Collection Time: 08/02/19  5:29 PM   Specimen: Abscess  Result Value Ref Range Status   Specimen Description   Final    ABSCESS  Performed at [facility], [address] [place], [place] [address]    Special Requests ILIACUS MUSCLE  Final   Gram Stain   Final    MODERATE WBC PRESENT,BOTH PMN AND MONONUCLEAR ABUNDANT GRAM POSITIVE COCCI IN PAIRS IN CLUSTERS    Culture   Final    ABUNDANT METHICILLIN RESISTANT STAPHYLOCOCCUS AUREUS NO ANAEROBES ISOLATED Performed at [facility], [place] [address], [place], [place] [address]    Report Status 08/07/2019 FINAL  Final   Organism ID, Bacteria METHICILLIN RESISTANT STAPHYLOCOCCUS AUREUS  Final      Susceptibility   Methicillin resistant staphylococcus aureus - MIC*    CIPROFLOXACIN >=8 RESISTANT Resistant     ERYTHROMYCIN >=8 RESISTANT Resistant     GENTAMICIN <=0.5 SENSITIVE Sensitive     OXACILLIN >=4 RESISTANT  Resistant     TETRACYCLINE <=1 SENSITIVE Sensitive     VANCOMYCIN 1 SENSITIVE Sensitive     TRIMETH/SULFA <=10 SENSITIVE Sensitive     CLINDAMYCIN <=0.25 SENSITIVE Sensitive     RIFAMPIN <=0.5 SENSITIVE Sensitive     Inducible Clindamycin NEGATIVE Sensitive     * ABUNDANT METHICILLIN RESISTANT STAPHYLOCOCCUS AUREUS  MRSA PCR Screening     Status: Abnormal   Collection Time: 08/02/19  8:24 PM   Specimen: Nasal Mucosa; Nasopharyngeal  Result Value Ref Range Status   MRSA by PCR POSITIVE (A) NEGATIVE Final    Comment:        The GeneXpert MRSA Assay (FDA approved for NASAL specimens only), is one component of a comprehensive MRSA colonization surveillance program. It is not intended to diagnose MRSA infection nor to guide or monitor treatment for MRSA infections. RESULT CALLED TO, READ BACK BY AND VERIFIED WITH: Gypsy Lore 08/02/19 @ 2143  [place] Performed at [facility], [address] [place], [place] [address]   CULTURE, BLOOD (ROUTINE X 2) w Reflex to ID Panel     Status: Abnormal   Collection Time: 08/03/19 10:17 AM   Specimen: BLOOD  Result Value Ref Range Status   Specimen Description   Final    BLOOD LEFT ANTECUBITAL Performed at [facility], [address], [place], [place] [address]    Special Requests   Final    BOTTLES DRAWN AEROBIC AND ANAEROBIC Blood Culture adequate volume Performed at [facility], [address], [place], [place] [address]    Culture  Setup Time   Final    GRAM POSITIVE COCCI IN BOTH AEROBIC AND ANAEROBIC BOTTLES CRITICAL VALUE NOTED.  VALUE IS CONSISTENT WITH PREVIOUSLY REPORTED AND CALLED VALUE. Performed at [facility], [address] [place], [place] [address]    Culture (A)  Final    STAPHYLOCOCCUS AUREUS SUSCEPTIBILITIES PERFORMED ON PREVIOUS CULTURE WITHIN THE LAST 5 DAYS. Performed at [facility], [place] [address] [place], [place] [address]    Report Status 08/06/2019 FINAL  Final   CULTURE, BLOOD (ROUTINE X 2) w Reflex to ID Panel     Status: Abnormal   Collection Time: 08/03/19 10:32 AM   Specimen: BLOOD  Result Value Ref Range Status   Specimen Description   Final    BLOOD BLOOD LEFT HAND Performed at [facility], [address], [place], [place] [address]    Special Requests   Final    BOTTLES DRAWN AEROBIC AND ANAEROBIC Blood Culture adequate volume Performed at [facility], [address] [place], [place] [address]    Culture  Setup Time  Final    GRAM POSITIVE COCCI IN BOTH AEROBIC AND ANAEROBIC BOTTLES CRITICAL RESULT CALLED TO, READ BACK BY AND VERIFIED WITH: Hart Robinsons [facility] [id number] 08/04/19 [place] Performed at [facility], [address] [place], [place] [address]    Culture (A)  Final    STAPHYLOCOCCUS AUREUS SUSCEPTIBILITIES PERFORMED ON PREVIOUS CULTURE WITHIN THE LAST 5 DAYS. Performed at [facility], [place] [address], [place], [place] [address]    Report Status 08/06/2019 FINAL  Final  Culture, blood (Routine X 2) w Reflex to ID Panel     Status: None   Collection Time: 08/05/19  7:18 AM   Specimen: BLOOD  Result Value Ref Range Status   Specimen Description BLOOD LEFT ANTECUBITAL  Final   Special Requests   Final    BOTTLES DRAWN AEROBIC AND ANAEROBIC Blood Culture adequate volume   Culture   Final    NO GROWTH 5 DAYS Performed at [facility], [place] [address] [place], [place] [address]    Report Status 08/10/2019 FINAL  Final  Culture, blood (Routine X 2) w Reflex to ID Panel     Status: None   Collection Time: 08/05/19  7:19 AM   Specimen: BLOOD LEFT HAND  Result Value Ref Range Status   Specimen Description BLOOD LEFT HAND  Final   Special Requests   Final    BOTTLES DRAWN AEROBIC AND ANAEROBIC Blood Culture adequate volume   Culture   Final    NO GROWTH 5 DAYS Performed at [facility], [place] [address], [place], [place] [address]    Report Status 08/10/2019 FINAL  Final  Body  fluid culture     Status: None (Preliminary result)   Collection Time: 08/09/19  4:54 PM   Specimen: Body Fluid  Result Value Ref Range Status   Specimen Description FLUID PLEURAL RIGHT  Final   Special Requests NONE  Final   Gram Stain   Final    ABUNDANT WBC PRESENT, PREDOMINANTLY PMN MODERATE GRAM POSITIVE COCCI    Culture   Final    CULTURE REINCUBATED FOR BETTER GROWTH Performed at [facility], [place] [address] [place], [place] [address]    Report Status PENDING  Incomplete     Medications:   . Chlorhexidine Gluconate Cloth  6 each Topical Daily  . docusate sodium  100 mg Oral BID  . enoxaparin (LOVENOX) injection  40 mg Subcutaneous Q24H  . polyethylene glycol  17 g Oral Daily  . sodium chloride flush  10-40 mL Intracatheter Q12H  . traZODone  150 mg Oral QHS   Continuous Infusions: . sodium chloride 250 mL (08/08/19 1839)  . vancomycin 1,500 mg (08/10/19 0102)      LOS: 7 days   Charlynne Cousins  [facility]  08/10/2019, 9:26 AM

## 2019-08-10 NOTE — Progress Notes (Addendum)
Referring Physician(s): Sandi Mariscal  Supervising Physician: Daryll Brod  Patient Status:  [name] - In-pt  Chief Complaint:  Chest/abdominal pain, recent fever/chills  Subjective: Patient familiar to IR service from right iliac [place] muscle abscess aspiration on 08/02/2019 and right thoracentesis on 08/09/2019 yielding about 13 cc of purulent fluid. She is s a 34 y.o. female with past medical history of anxiety, depression, bipolar 1 disorder, and prior IV drug/substance abuse who presented to [facility] ED with sepsis and fevers, found to have MRSA bacteremia. Patient has been treated with IV abx for the past 6 days with little improvement.  Follow-up CT chest abdomen pelvis today revealed: Grossly stable loculated effusion or empyema is seen involving the anterior and lateral portion of the right upper lobe.  Grossly stable small bilateral lung opacities are noted, most likely infectious in etiology.  Right posterior atelectasis or infiltrate is noted.  Continued significant enlargement of right ileo psoas muscle with multiple irregular and peripherally enhancing low densities throughout its course, most consistent with abscesses. Probable abscesses are also noted in the right iliacus muscle as well as in the right gluteus medius muscle.  She is currently afebrile, but tachycardic; creat nl; pleural fluid cx with few staph; WBC 3/9  11.9; she denies headache, worsening dyspnea, cough, back pain, nausea, vomiting or bleeding.  She does have some abdominal/right hip pain as well as constipation.  Request received from primary care team for image guided aspiration possible drainage of iliacus muscle abscess.  Past Medical History:  Diagnosis Date  . Anxiety   . Bipolar 1 disorder ([place])   . Depression   . Migraine   . Nerve damage    Past Surgical History:  Procedure Laterality Date  . CESAREAN SECTION    . IR THORACENTESIS ASP PLEURAL SPACE W/IMG GUIDE  08/09/2019        Allergies: Patient has no known allergies.  Medications: Prior to Admission medications   Medication Sig Start Date End Date Taking? Authorizing Provider  acetaminophen (TYLENOL) 325 MG tablet Take 2 tablets (650 mg total) by mouth every 6 (six) hours as needed for fever. 08/03/19  Yes Nolberto Hanlon, MD  ibuprofen (ADVIL) 200 MG tablet Take 200 mg by mouth every 6 (six) hours as needed for moderate pain.   Yes [provider]  ceFEPIme 2 g in sodium chloride 0.9 % 100 mL Inject 2 g into the vein every 8 (eight) hours. Patient not taking: Reported on 08/03/2019 08/03/19   Nolberto Hanlon, MD  Chlorhexidine Gluconate Cloth 2 % PADS Apply 6 each topically daily. Patient not taking: Reported on 08/03/2019 08/03/19   Nolberto Hanlon, MD  clindamycin (CLEOCIN) 900 MG/50ML IVPB Inject 50 mLs (900 mg total) into the vein every 8 (eight) hours. Patient not taking: Reported on 08/03/2019 08/03/19   Nolberto Hanlon, MD  Dextrose-Sodium Chloride (DEXTROSE 5 % AND 0.45% NACL) infusion Inject 150 mL/hr into the vein continuous. Patient not taking: Reported on 08/03/2019 08/03/19   Nolberto Hanlon, MD  enoxaparin (LOVENOX) 40 MG/0.4ML injection Inject 0.4 mLs (40 mg total) into the skin daily. Patient not taking: Reported on 08/03/2019 08/03/19   Nolberto Hanlon, MD  norepinephrine (LEVOPHED) 4-5 MG/250ML-% SOLN Inject 0-40 mcg/min into the vein continuous. Patient not taking: Reported on 08/03/2019 08/03/19   Nolberto Hanlon, MD  oxyCODONE (OXY IR/ROXICODONE) 5 MG immediate release tablet Take 1 tablet (5 mg total) by mouth every 6 (six) hours as needed for breakthrough pain. Patient not taking: Reported on 08/03/2019  08/03/19   Nolberto Hanlon, MD  potassium chloride 10 MEQ/100ML Inject 100 mLs (10 mEq total) into the vein every 1 hour x 6 doses. Patient not taking: Reported on 08/03/2019 08/03/19   Nolberto Hanlon, MD  vancomycin (VANCOREADY) 750 MG/150ML SOLN Inject 150 mLs (750 mg total) into the vein every 8 (eight) hours. Patient not  taking: Reported on 08/03/2019 08/03/19   Nolberto Hanlon, MD     Vital Signs: BP 93/63 (BP Location: Right Arm)   Pulse (!) 101   Temp 98 F (36.7 C) (Rectal)   Resp 16   Ht 5\' 1"  (1.549 m)   Wt 146 lb 13.2 oz (66.6 kg)   LMP  (LMP Unknown)   SpO2 99%   BMI 27.74 kg/m   Physical Exam awake, alert.  Chest with slightly diminished breath sounds bases, occasional wheeze.  Heart with tachycardic but regular rhythm.  Abdomen soft, positive bowel sounds, some mild generalized tenderness to palpation.  No lower extremity edema.  Imaging: DG Chest 1 View  Result Date: 08/09/2019 CLINICAL DATA:  Status post right thoracentesis EXAM: CHEST  1 VIEW COMPARISON:  08/03/2019 FINDINGS: Cardiac shadow is within normal limits. The lungs are well aerated. No pneumothorax is noted following right thoracentesis. No sizable effusion is seen. No bony abnormality is noted. IMPRESSION: No evidence of pneumothorax. Electronically Signed   By: Inez Catalina M.D.   On: 08/09/2019 17:14   CT SOFT TISSUE NECK W CONTRAST  Result Date: 08/06/2019 CLINICAL DATA:  Infection, soft tissue infection, abscess; soft tissue mass, neck, no prior imaging. Additional history provided: Swelling, pain, redness to right side of body where the neck meets the shoulder. EXAM: CT NECK WITH CONTRAST TECHNIQUE: Multidetector CT imaging of the neck was performed using the standard protocol following the bolus administration of intravenous contrast. CONTRAST:  48mL OMNIPAQUE IOHEXOL 350 MG/ML SOLN COMPARISON:  CT angiogram chest 08/02/2019 FINDINGS: Pharynx and larynx: No appreciable mass or swelling within the oral cavity, pharynx or larynx. Salivary glands: No inflammation, mass, or stone. Thyroid: Unremarkable thyroid gland. Lymph nodes: No pathologically enlarged cervical chain lymph nodes. Vascular: The major vascular structures of the neck appear patent. Limited intracranial: No acute intracranial abnormality identified Visualized orbits:  Visualized orbits demonstrate no acute abnormality. Mastoids and visualized paranasal sinuses: The bilateral maxillary sinuses demonstrate mild mucosal thickening and reactive osteitis. No significant mastoid effusion. Skeleton: No acute bony abnormality or aggressive osseous lesion. C5-C6 disc bulge. Upper chest: New from prior CTA chest 08/02/2019, there are numerous peripheral predominant pulmonary nodules within the imaged lung apices suspicious for septic emboli. There is a loculated collection within the right pleural space along the lateral aspect of the right lung apex measuring 8.5 x 2.7 cm in transaxial dimensions. There is a punctate focus of gas within this collection. Findings likely reflect empyema. In retrospect, this finding was present on prior examination 08/02/2019, but has increased in size since this prior study. No definite erosion of the adjacent ribs. There is extension of low-density edema into the adjacent right pectoralis musculature measuring 3.9 x 2.2 cm in transaxial dimensions (for instance as seen series 3, image 104). Associated inflammatory stranding within the right retroclavicular region and lower right neck. Partially visualized left IJ approach central venous catheter. These results will be called to the ordering clinician or representative by the Radiologist Assistant, and communication documented in the PACS or zVision Dashboard. IMPRESSION: 1. Multiple peripheral predominant nodules within the imaged lung apices, new as compared to prior examination  08/02/2019 and highly suspicious for septic emboli. 2. Right pleural collection containing a punctate focus of gas along the lateral aspect of the right lung apex likely reflecting empyema. Extension of low attenuation edema into the adjacent right pectoralis musculature. No peripheral enhancement is demonstrated at this site on the current examination to suggest formed abscess. Associated inflammatory stranding within the right  retroclavicular region and right lower neck consistent with soft tissue infection. Dedicated chest CT is recommended for further evaluation. Electronically Signed   By: Kellie Simmering DO   On: 08/06/2019 18:39   CT CHEST WO CONTRAST  Result Date: 08/08/2019 CLINICAL DATA:  Empyema. Sepsis secondary to MRSA bacteremia. Leukocytosis, tachycardia and tachypnea. EXAM: CT CHEST WITHOUT CONTRAST TECHNIQUE: Multidetector CT imaging of the chest was performed following the standard protocol without IV contrast. COMPARISON:  Chest radiograph 08/03/2019, chest CT 08/02/2019 FINDINGS: Cardiovascular: Left upper extremity PICC in place. No aortic aneurysm. Heart is normal in size. Small pericardial effusion measuring 12 mm in depth inferiorly. Mediastinum/Nodes: Small mediastinal lymph nodes not enlarged by size criteria. Limited assessment for hilar adenopathy given lack of IV contrast. No esophageal wall thickening. Lungs/Pleura: Dependent consolidation in right lower lobe has air bronchograms, suspicious for pneumonia. Minimal dependent right pleural thickening without frank effusion. Loculated pleural fluid at the anterior right upper lobe measures up to 3 cm, increased since prior. Multiple small pulmonary nodules and nodular densities are new from prior exam, likely infectious. No cavitary lesions. Streaky opacities in the dependent left lower lobe, likely atelectasis. Upper Abdomen: No acute findings on noncontrast exam. Moderate volume of stool in the included colon. Musculoskeletal: Soft tissue thickening involving the right first and second intercostal muscles again seen, less well-defined currently in the absence of IV contrast. There is soft tissue edema in the adjacent right axilla, no subcutaneous fluid collection. No periosteal reaction or bony destructive change. IMPRESSION: 1. Small loculated pleural effusion at the right lung apex adjacent to thickening of the intercostal muscles, slightly increased in size  from CT 6 days ago. 2. Scattered nodular densities throughout both lungs, also new, likely infectious. No definite cavitary lesions. Dependent right lower lobe consolidation consistent with pneumonia. Minimal right pleural thickening without sub pulmonic effusion. 3. Small pericardial effusion. Electronically Signed   By: Keith Rake M.D.   On: 08/08/2019 19:14   CT CHEST W CONTRAST  Result Date: 08/10/2019 CLINICAL DATA:  Right ileo psoas abscess.  Empyema. EXAM: CT CHEST, ABDOMEN, AND PELVIS WITH CONTRAST TECHNIQUE: Multidetector CT imaging of the chest, abdomen and pelvis was performed following the standard protocol during bolus administration of intravenous contrast. CONTRAST:  1104mL OMNIPAQUE IOHEXOL 300 MG/ML  SOLN 80 mL OMNIPAQUE IOHEXOL 300 MG/ML  SOLN COMPARISON:  None. August 02, 2019.  August 08, 2019. FINDINGS: CT ABDOMEN PELVIS FINDINGS Hepatobiliary: No focal liver abnormality is seen. No gallstones, gallbladder wall thickening, or biliary dilatation. Pancreas: Unremarkable. No pancreatic ductal dilatation or surrounding inflammatory changes. Spleen: Normal in size without focal abnormality. Adrenals/Urinary Tract: Adrenal glands are unremarkable. Kidneys are normal, without renal calculi, focal lesion, or hydronephrosis. Bladder is unremarkable. Stomach/Bowel: Stomach is within normal limits. Appendix appears normal. No evidence of bowel wall thickening, distention, or inflammatory changes. Vascular/Lymphatic: No significant vascular findings are present. No enlarged abdominal or pelvic lymph nodes. Reproductive: Intrauterine device is noted. No adnexal abnormality is noted. Other: No abdominal wall hernia or abnormality. No abdominopelvic ascites. Musculoskeletal: There is significant enlargement of the right ileo psoas muscle with multiple irregular peripherally enhancing  low densities throughout its course concerning for multiple abscesses. This is seen as far distally as its insertion with  the proximal femur. There is also noted a 3.0 x 2.4 cm fluid collection seen in the gluteus medius muscle on the right. 20 x 15 mm fluid collection is noted in right iliacus muscle. Chest: Cardiovascular: No significant vascular findings. Normal heart size. Small pericardial effusion. Mediastinum/Nodes: No enlarged mediastinal, hilar, or axillary lymph nodes. Thyroid gland, trachea, and esophagus demonstrate no significant findings. Lungs/Pleura: Grossly stable appearance of probable small loculated pleural effusion seen along the lateral and anterior aspect of the right upper lobe. It currently measures 8.6 x 2.7 cm. No pneumothorax is noted. Right posterior atelectasis or infiltrate is noted. Mild left basilar atelectasis is noted. Stable small bilateral lung opacities are noted, most likely infectious in etiology. IMPRESSION: Grossly stable loculated effusion or empyema is seen involving the anterior and lateral portion of the right upper lobe. Grossly stable small bilateral lung opacities are noted, most likely infectious in etiology. Right posterior atelectasis or infiltrate is noted. Continued significant enlargement of right ileo psoas muscle with multiple irregular and peripherally enhancing low densities throughout its course, most consistent with abscesses. Probable abscesses are also noted in the right iliacus muscle as well as in the right gluteus medius muscle. Electronically Signed   By: Marijo Conception M.D.   On: 08/10/2019 13:28   CT ABDOMEN PELVIS W CONTRAST  Result Date: 08/10/2019 CLINICAL DATA:  Right ileo psoas abscess.  Empyema. EXAM: CT CHEST, ABDOMEN, AND PELVIS WITH CONTRAST TECHNIQUE: Multidetector CT imaging of the chest, abdomen and pelvis was performed following the standard protocol during bolus administration of intravenous contrast. CONTRAST:  136mL OMNIPAQUE IOHEXOL 300 MG/ML  SOLN 80 mL OMNIPAQUE IOHEXOL 300 MG/ML  SOLN COMPARISON:  None. August 02, 2019.  August 08, 2019. FINDINGS:  CT ABDOMEN PELVIS FINDINGS Hepatobiliary: No focal liver abnormality is seen. No gallstones, gallbladder wall thickening, or biliary dilatation. Pancreas: Unremarkable. No pancreatic ductal dilatation or surrounding inflammatory changes. Spleen: Normal in size without focal abnormality. Adrenals/Urinary Tract: Adrenal glands are unremarkable. Kidneys are normal, without renal calculi, focal lesion, or hydronephrosis. Bladder is unremarkable. Stomach/Bowel: Stomach is within normal limits. Appendix appears normal. No evidence of bowel wall thickening, distention, or inflammatory changes. Vascular/Lymphatic: No significant vascular findings are present. No enlarged abdominal or pelvic lymph nodes. Reproductive: Intrauterine device is noted. No adnexal abnormality is noted. Other: No abdominal wall hernia or abnormality. No abdominopelvic ascites. Musculoskeletal: There is significant enlargement of the right ileo psoas muscle with multiple irregular peripherally enhancing low densities throughout its course concerning for multiple abscesses. This is seen as far distally as its insertion with the proximal femur. There is also noted a 3.0 x 2.4 cm fluid collection seen in the gluteus medius muscle on the right. 20 x 15 mm fluid collection is noted in right iliacus muscle. Chest: Cardiovascular: No significant vascular findings. Normal heart size. Small pericardial effusion. Mediastinum/Nodes: No enlarged mediastinal, hilar, or axillary lymph nodes. Thyroid gland, trachea, and esophagus demonstrate no significant findings. Lungs/Pleura: Grossly stable appearance of probable small loculated pleural effusion seen along the lateral and anterior aspect of the right upper lobe. It currently measures 8.6 x 2.7 cm. No pneumothorax is noted. Right posterior atelectasis or infiltrate is noted. Mild left basilar atelectasis is noted. Stable small bilateral lung opacities are noted, most likely infectious in etiology. IMPRESSION:  Grossly stable loculated effusion or empyema is seen involving the anterior and lateral  portion of the right upper lobe. Grossly stable small bilateral lung opacities are noted, most likely infectious in etiology. Right posterior atelectasis or infiltrate is noted. Continued significant enlargement of right ileo psoas muscle with multiple irregular and peripherally enhancing low densities throughout its course, most consistent with abscesses. Probable abscesses are also noted in the right iliacus muscle as well as in the right gluteus medius muscle. Electronically Signed   By: Marijo Conception M.D.   On: 08/10/2019 13:28   MR HAND RIGHT WO CONTRAST  Result Date: 08/09/2019 CLINICAL DATA:  Hand pain swelling. EXAM: MRI OF THE RIGHT HAND WITHOUT CONTRAST TECHNIQUE: Multiplanar, multisequence MR imaging of the right hand was performed. No intravenous contrast was administered. COMPARISON:  Radiographs 08/03/2019 FINDINGS: Focal inflammatory changes around the third MCP joint with soft tissue swelling/edema mainly around the dorsum of the hand. Somewhat ill-defined fluid collection measures 14 mm and is superficial to the extensor tendon. The does appear to be some inflammatory tenosynovitis also. A small joint effusion is noted but I do not see any definite MR findings for septic arthritis or osteomyelitis. The flexor tendons appear normal. The radial and ulnar collateral ligaments are intact. No findings for diffuse cellulitis or myofasciitis. IMPRESSION: 1. MR focal inflammatory changes around the third MCP joints as discussed above. Possible ill-defined superficial abscess dorsally. 2. No findings for septic arthritis or osteomyelitis. 3. No findings for diffuse cellulitis or myofasciitis. Electronically Signed   By: Marijo Sanes M.D.   On: 08/09/2019 06:21   [place] EKG SITE RITE  Result Date: 08/07/2019 If Site Rite image not attached, placement could not be confirmed due to current cardiac rhythm.  [place] EKG  SITE RITE  Result Date: 08/07/2019 If Site Rite image not attached, placement could not be confirmed due to current cardiac rhythm.  IR THORACENTESIS ASP PLEURAL SPACE W/IMG GUIDE  Result Date: 08/09/2019 INDICATION: 34 year old with a small right chest empyema. EXAM: ULTRASOUND GUIDED RIGHT THORACENTESIS MEDICATIONS: None. COMPLICATIONS: None immediate. PROCEDURE: An ultrasound guided thoracentesis was thoroughly discussed with the patient and questions answered. The benefits, risks, alternatives and complications were also discussed. The patient understands and wishes to proceed with the procedure. Written consent was obtained. Ultrasound was used to evaluate the right upper chest. Small complex pleural-based collection was identified in the right upper chest. Skin was prepped with chlorhexidine and sterile field was created. Skin was anesthetized with 1% lidocaine. A 19 gauge, 7 cm Yueh catheter was directed into the small collection under real-time ultrasound guidance. Approximately 13 mL of thick yellow purulent fluid was aspirated. Fluid was collected and sent for culture. Bandage placed over the puncture site. FINDINGS: Small irregular pleural-based collection in the anterior right upper chest. This collection corresponds with the small pleural-based collection on recent CT imaging. Yueh catheter was successfully placed within the collection and 13 mL of thick yellow purulent fluid was aspirated. Collection was slightly smaller based on ultrasound after the fluid had been aspirated. IMPRESSION: Successful ultrasound guided right thoracentesis yielding 13 mL of purulent fluid from the small right chest empyema. Electronically Signed   By: Markus Daft M.D.   On: 08/09/2019 18:27    Labs:  CBC: Recent Labs    08/05/19 0530 08/06/19 0541 08/07/19 0615 08/08/19 0339  WBC 11.0* 11.0* 13.3* 11.9*  HGB 9.5* 10.4* 10.1* 10.0*  HCT 28.2* 31.5* 31.6* 30.8*  PLT 386 437* 425* 464*    COAGS: Recent  Labs    08/03/19 0250  INR 1.3*  BMP: Recent Labs    08/05/19 0530 08/05/19 0530 08/06/19 0541 08/07/19 0615 08/08/19 0339 08/10/19 0500  NA 135  --  135 139 135  --   K 3.1*  --  3.1* 4.6 4.1  --   CL 98  --  96* 106 102  --   CO2 25  --  28 25 24   --   GLUCOSE 149*  --  141* 112* 148*  --   BUN <5*  --  <5* <5* 7  --   CALCIUM 7.6*  --  7.7* 7.8* 8.0*  --   CREATININE 0.58   < > 0.54 0.36* 0.45 0.50  GFRNONAA >60   < > >60 >60 >60 >60  GFRAA >60   < > >60 >60 >60 >60   < > = values in this interval not displayed.    LIVER FUNCTION TESTS: Recent Labs    08/05/19 0530 08/06/19 0541 08/07/19 0615 08/08/19 0339  BILITOT 0.5 0.5 0.7 0.3  AST 56* 57* 29 35  ALT 36 48* 42 53*  ALKPHOS 121 127* 106 122  PROT 5.2* 5.6* 5.4* 5.7*  ALBUMIN 1.7* 1.8* 1.7* 1.7*    Assessment and Plan: 34 y.o. female with past medical history of anxiety, depression, bipolar 1 disorder, and prior IV drug/substance abuse who presented to [facility] ED with sepsis and fevers, found to have MRSA bacteremia. Patient has been treated with IV abx for the past 6 days with little improvement.  She underwent right thoracentesis yesterday yielding about 13 cc of purulent fluid with cultures growing staph aureus.  Follow-up CT chest abdomen pelvis today revealed: Grossly stable loculated effusion or empyema is seen involving the anterior and lateral portion of the right upper lobe.  Grossly stable small bilateral lung opacities are noted, most likely infectious in etiology.  Right posterior atelectasis or infiltrate is noted.  Continued significant enlargement of right ileo psoas muscle with multiple irregular and peripherally enhancing low densities throughout its course, most consistent with abscesses. Probable abscesses are also noted in the right iliacus muscle as well as in the right gluteus medius muscle.  She is currently afebrile, but tachycardic; creat nl; pleural fluid cx with few staph; WBC  3/9  11.9; request received from primary care team for image guided aspiration possible drainage of iliacus muscle abscess.  Latest imaging studies were reviewed by Dr. Annamaria Boots.  At this time we will plan possible CT-guided right retroperitoneal aspiration versus drain placement, possible right gluteal abscess aspiration/drain well as right anterior pelvic aspiration/drain depending upon findings on follow-up CT tomorrow.Risks and benefits discussed with the patient/mother including bleeding, infection, damage to adjacent structures, bowel perforation/fistula connection, and sepsis.  All of the patient's questions were answered, patient is agreeable to proceed. Consent signed and in chart.  Procedure scheduled for 3/12  Pt also inquiring about IUD removal- rec GYN consult  Electronically Signed: D. Rowe Robert, PA-C 08/10/2019, 3:25 PM   I spent a total of 25 minutes at the the patient's bedside AND on the patient's hospital floor or unit, greater than 50% of which was counseling/coordinating care for     Patient ID: Leslie Molina, female   DOB: 09-21-1985, 34 y.o.   MRN: [id number]

## 2019-08-10 NOTE — Progress Notes (Signed)
Pharmacy Antibiotic Note  Leslie Molina is a 34 y.o. 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  Pharmacy consulted for Daptomycin dosing.  Vancomycin discontinued. Also starting Ceftaroline tonight.  Day 9 vancomycin. PICC line placed 3/9. Still having high fevers, and repeat imaging shows slightly larger empyema and larger iliopsoas abscess. CT-guided drain of iliopsoas abscess planned for 3/12. TCTS or pulmonary to review empyema for need for chest tube.   Requested to dose Daptomycin at 8 mg/kg.  80.2 kg on admit 3/5 and down to 66.6 kg today.  Plan: Daptomycin 500 mg IV q24hrs.  ~7.5 mg/kg, but vial size, so will begin with this dose.  Will follow up weight for any need to modify. CK already ordered for tonight. Will check weekly while on Daptomycin. Also starting Ceftaroline 600 mg IV q12hrs. Follow weights, renal function, culture data, clinical progress.   Height: 5\' 1"  (154.9 cm) Weight: 146 lb 13.2 oz (66.6 kg) IBW/kg (Calculated) : 47.8  Temp (24hrs), Avg:99.6 F (37.6 C), Min:96.8 F (36 C), Max:102.2 F (39 C)  Recent Labs  Lab 08/03/19 1847 08/03/19 1847 08/04/19 0500 08/05/19 0100 08/05/19 0529 08/05/19 0530 08/06/19 0541 08/06/19 1750 08/06/19 2100 08/07/19 0615 08/08/19 0339 08/09/19 0500 08/09/19 0959 08/10/19 0500  WBC 12.1*   < > 13.0*  --   --  11.0* 11.0*  --   --  13.3* 11.9*  --   --   --   CREATININE 0.55   < > 0.54  --   --  0.58 0.54  --   --  0.36* 0.45  --   --  0.50  LATICACIDVEN 0.8  --   --   --   --   --   --   --   --   --   --   --   --   --   VANCOTROUGH  --   --   --    < >   < >  --   --   --  7*  --   --   --  11*  --   VANCOPEAK  --   --   --    < >  --   --   --  19*  --   --   --  30  --   --    < > = values in this interval not displayed.    Estimated Creatinine Clearance: 86.5 mL/min (by C-G formula based on SCr of 0.5 mg/dL).    No Known Allergies  Antimicrobials this  admission: metronidazole x 1 on 3/3 vancomycin 3/3 >> 3/11 cefepime 3/4 >>3/5 daptomycin 500mg  x 1 on 3/4; resume 3/11 ceftaroline 3/11 >>  Microbiology results: 3/3 BCx: GPC clusters 4/4 bottles  (BCID = MRSA) 3/3 iliopsoas abscess: MRSA 3/6 blood x 2: negative 3/10 right pleural fluid: few Staph aureus, reincubated  Arty Baumgartner, [place] Phone: [phone] 08/10/2019 6:22 PM

## 2019-08-10 NOTE — Progress Notes (Addendum)
Noted that muse turned red 1215, pt off floor at CT / MRI .

## 2019-08-10 NOTE — Progress Notes (Signed)
[facility]  Date of Admission:  08/03/2019     Total days of antibiotics 8         ASSESSMENT:  Leslie Molina fever curve appears to be slowly improving. IR able to aspirate 13 cc of purulent fluid with cultures growing Staphylococcus aureus. Repeat blood cultures finalized without growth to date. CT scan with stable loculated effusion/empyema of the right upper lobe and increase in iliopsoas, right iliacus and right gluteal abscesses. IR to drain abscesses tomorrow. Source control remains significant concern. Renal function without evidence of nephrotoxicity. Will check CBC with next blood work. Right 3rd MCP remains stable. Continue current dose of vancomycin.   PLAN:  1. Continue current dose of vancomycin.  2. Monitor fever curve for improvements.  3. IR for aspiration tomorrow.  4. Therapeutic monitoring of vancomycin and renal function. 5. Pain management per primary team.   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 Chlorhexidine Gluconate Cloth  6 each Topical Daily  . docusate sodium  100 mg Oral BID  . enoxaparin (LOVENOX) injection  40 mg Subcutaneous Q24H  . polyethylene glycol  17 g Oral Daily  . sodium chloride flush  10-40 mL Intracatheter Q12H  . traZODone  150 mg Oral QHS    SUBJECTIVE:  Febrile to 102 within the past 24 hours and appears down trending. No new WBC count available. IR aspirated 13 cc of purulent fluid. Update CT scan with increasing abscess in psoas, iliacus and medial gluteus.   No Known Allergies   Review of Systems: Review of Systems  Constitutional: Negative for chills, fever and weight loss.  Respiratory: Negative for cough, shortness of breath and wheezing.   Cardiovascular: Negative for chest pain and leg swelling.  Gastrointestinal:  Negative for abdominal pain, constipation, diarrhea, nausea and vomiting.  Skin: Negative for rash.      OBJECTIVE: Vitals:   08/09/19 2010 08/10/19 0009 08/10/19 0534 08/10/19 0800  BP: (!) 98/59 (!) 92/58 (!) 93/57 93/63  Pulse:    (!) 101  Resp: 20 18 18 16   Temp: (!) 102.2 F (39 C) (!) 101.2 F (38.4 C) (!) 96.8 F (36 C) 98 F (36.7 C)  TempSrc: Rectal Oral Oral Rectal  SpO2: 98% 99% 99% 99%  Weight:   66.6 kg   Height:       Body mass index is 27.74 kg/m.  Physical Exam Constitutional:      General: She is not in acute distress.    Appearance: She is well-developed.  Cardiovascular:     Rate and Rhythm: Normal rate and regular rhythm.     Heart sounds: Normal heart sounds.  Pulmonary:     Effort: Pulmonary effort is normal.     Breath sounds: Normal breath sounds.  Skin:    General: Skin is warm and dry.  Neurological:     Mental Status: She is alert and oriented to person, place, and time.  Psychiatric:        Behavior: Behavior normal.        Thought Content: Thought content normal.        Judgment: Judgment normal.     Lab Results Lab Results  Component Value Date   WBC 11.9 (H) 08/08/2019   HGB 10.0 (L)  08/08/2019   HCT 30.8 (L) 08/08/2019   MCV 86.5 08/08/2019   PLT 464 (H) 08/08/2019    Lab Results  Component Value Date   CREATININE 0.50 08/10/2019   BUN 7 08/08/2019   NA 135 08/08/2019   K 4.1 08/08/2019   CL 102 08/08/2019   CO2 24 08/08/2019    Lab Results  Component Value Date   ALT 53 (H) 08/08/2019   AST 35 08/08/2019   ALKPHOS 122 08/08/2019   BILITOT 0.3 08/08/2019     Microbiology: Recent Results (from the past 240 hour(s))  Respiratory Panel by RT PCR (Flu A&B, Covid) - Nasopharyngeal Swab     Status: None   Collection Time: 08/02/19 11:20 AM   Specimen: Nasopharyngeal Swab  Result Value Ref Range Status   SARS Coronavirus 2 by RT PCR NEGATIVE NEGATIVE Final    Comment: (NOTE) SARS-CoV-2 target nucleic acids are  NOT DETECTED. The SARS-CoV-2 RNA is generally detectable in upper respiratoy specimens during the acute phase of infection. The lowest concentration of SARS-CoV-2 viral copies this assay can detect is 131 copies/mL. A negative result does not preclude SARS-Cov-2 infection and should not be used as the sole basis for treatment or other patient management decisions. A negative result may occur with  improper specimen collection/handling, submission of specimen other than nasopharyngeal swab, presence of viral mutation(s) within the areas targeted by this assay, and inadequate number of viral copies (<131 copies/mL). A negative result must be combined with clinical observations, patient history, and epidemiological information. The expected result is Negative. Fact Sheet for Patients:  PinkCheek.be Fact Sheet for Healthcare Providers:  GravelBags.it This test is not yet ap proved or cleared by the [place] FDA and  has been authorized for detection and/or diagnosis of SARS-CoV-2 by FDA under an Emergency Use Authorization (EUA). This EUA will remain  in effect (meaning this test can be used) for the duration of the COVID-19 declaration under Section 564(b)(1) of the Act, 21 U.S.C. section 360bbb-3(b)(1), unless the authorization is terminated or revoked sooner.    Influenza A by PCR NEGATIVE NEGATIVE Final   Influenza B by PCR NEGATIVE NEGATIVE Final    Comment: (NOTE) The Xpert Xpress SARS-CoV-2/FLU/RSV assay is intended as an aid in  the diagnosis of influenza from Nasopharyngeal swab specimens and  should not be used as a sole basis for treatment. Nasal washings and  aspirates are unacceptable for Xpert Xpress SARS-CoV-2/FLU/RSV  testing. Fact Sheet for Patients: PinkCheek.be Fact Sheet for Healthcare Providers: GravelBags.it This test is not yet approved or  cleared by the [place] FDA and  has been authorized for detection and/or diagnosis of SARS-CoV-2 by  FDA under an Emergency Use Authorization (EUA). This EUA will remain  in effect (meaning this test can be used) for the duration of the  Covid-19 declaration under Section 564(b)(1) of the Act, 21  U.S.C. section 360bbb-3(b)(1), unless the authorization is  terminated or revoked. Performed at [facility], [address] [place], [place] [address]   Blood culture (routine x 2)     Status: Abnormal   Collection Time: 08/02/19 11:20 AM   Specimen: BLOOD  Result Value Ref Range Status   Specimen Description   Final    BLOOD RIGHT ANTECUBITAL Performed at [facility], [address], [place], [place] [address]    Special Requests   Final    BOTTLES DRAWN AEROBIC AND ANAEROBIC Blood Culture adequate volume Performed at [facility], [place]  [address] [place], [place] [address]    Culture  Setup Time   Final    IN BOTH AEROBIC AND ANAEROBIC BOTTLES GRAM POSITIVE COCCI CRITICAL RESULT CALLED TO, READ BACK BY AND VERIFIED WITH: SCOTT HALL @2022  08/02/19 [place] Performed at [facility], [address] [place], [place] [address]    Culture METHICILLIN RESISTANT STAPHYLOCOCCUS AUREUS (A)  Final   Report Status 08/05/2019 FINAL  Final   Organism ID, Bacteria METHICILLIN RESISTANT STAPHYLOCOCCUS AUREUS  Final      Susceptibility   Methicillin resistant staphylococcus aureus - MIC*    CIPROFLOXACIN >=8 RESISTANT Resistant     ERYTHROMYCIN >=8 RESISTANT Resistant     GENTAMICIN <=0.5 SENSITIVE Sensitive     OXACILLIN >=4 RESISTANT Resistant     TETRACYCLINE <=1 SENSITIVE Sensitive     VANCOMYCIN <=0.5 SENSITIVE Sensitive     TRIMETH/SULFA <=10 SENSITIVE Sensitive     CLINDAMYCIN <=0.25 SENSITIVE Sensitive     RIFAMPIN <=0.5 SENSITIVE Sensitive     Inducible Clindamycin NEGATIVE Sensitive     * METHICILLIN RESISTANT STAPHYLOCOCCUS AUREUS  Blood  culture (routine x 2)     Status: Abnormal   Collection Time: 08/02/19 11:20 AM   Specimen: BLOOD  Result Value Ref Range Status   Specimen Description   Final    BLOOD BLOOD RIGHT ARM Performed at [facility], [address], [place], [place] [address]    Special Requests   Final    BOTTLES DRAWN AEROBIC AND ANAEROBIC Blood Culture adequate volume Performed at [facility], [address] [place], [place] [address]    Culture  Setup Time   Final    IN BOTH AEROBIC AND ANAEROBIC BOTTLES GRAM POSITIVE COCCI CRITICAL RESULT CALLED TO, READ BACK BY AND VERIFIED WITH: [place] HALL @0019  08/03/19 AKT    Culture (A)  Final    STAPHYLOCOCCUS AUREUS SUSCEPTIBILITIES PERFORMED ON PREVIOUS CULTURE WITHIN THE LAST 5 DAYS. Performed at [facility], [place] [address], [place], [place] [address]    Report Status 08/05/2019 FINAL  Final  Blood Culture ID Panel (Reflexed)     Status: Abnormal   Collection Time: 08/02/19 11:20 AM  Result Value Ref Range Status   Enterococcus species NOT DETECTED NOT DETECTED Final   Listeria monocytogenes NOT DETECTED NOT DETECTED Final   Staphylococcus species DETECTED (A) NOT DETECTED Final    Comment: CRITICAL RESULT CALLED TO, READ BACK BY AND VERIFIED WITH: [place] HALL @0019  08/03/19 AKT    Staphylococcus aureus (BCID) DETECTED (A) NOT DETECTED Final    Comment: Methicillin (oxacillin)-resistant Staphylococcus aureus (MRSA). MRSA is predictably resistant to beta-lactam antibiotics (except ceftaroline). Preferred therapy is vancomycin unless clinically contraindicated. Patient requires contact precautions if  hospitalized. CRITICAL RESULT CALLED TO, READ BACK BY AND VERIFIED WITH: [place] HALL @0019  08/03/19 AKT    Methicillin resistance DETECTED (A) NOT DETECTED Final    Comment: CRITICAL RESULT CALLED TO, READ BACK BY AND VERIFIED WITH: [place] HALL @0019  08/03/19 AKT    Streptococcus species NOT DETECTED NOT DETECTED Final   Streptococcus  agalactiae NOT DETECTED NOT DETECTED Final   Streptococcus pneumoniae NOT DETECTED NOT DETECTED Final   Streptococcus pyogenes NOT DETECTED NOT DETECTED Final   Acinetobacter baumannii NOT DETECTED NOT DETECTED Final   Enterobacteriaceae species NOT DETECTED NOT DETECTED Final   Enterobacter cloacae complex NOT DETECTED NOT DETECTED Final   Escherichia coli NOT DETECTED NOT DETECTED Final   Klebsiella oxytoca NOT DETECTED NOT DETECTED Final   Klebsiella  pneumoniae NOT DETECTED NOT DETECTED Final   Proteus species NOT DETECTED NOT DETECTED Final   Serratia marcescens NOT DETECTED NOT DETECTED Final   Haemophilus influenzae NOT DETECTED NOT DETECTED Final   Neisseria meningitidis NOT DETECTED NOT DETECTED Final   Pseudomonas aeruginosa NOT DETECTED NOT DETECTED Final   Candida albicans NOT DETECTED NOT DETECTED Final   Candida glabrata NOT DETECTED NOT DETECTED Final   Candida krusei NOT DETECTED NOT DETECTED Final   Candida parapsilosis NOT DETECTED NOT DETECTED Final   Candida tropicalis NOT DETECTED NOT DETECTED Final    Comment: Performed at [facility], [address], [place], [place] [address]  Aerobic/Anaerobic Culture (surgical/deep wound)     Status: None   Collection Time: 08/02/19  5:29 PM   Specimen: Abscess  Result Value Ref Range Status   Specimen Description   Final    ABSCESS Performed at [facility], [address] [place], [place] [address]    Special Requests ILIACUS MUSCLE  Final   Gram Stain   Final    MODERATE WBC PRESENT,BOTH PMN AND MONONUCLEAR ABUNDANT GRAM POSITIVE COCCI IN PAIRS IN CLUSTERS    Culture   Final    ABUNDANT METHICILLIN RESISTANT STAPHYLOCOCCUS AUREUS NO ANAEROBES ISOLATED Performed at [facility], [place] [address] [place], [place] [address]    Report Status 08/07/2019 FINAL  Final   Organism ID, Bacteria METHICILLIN RESISTANT STAPHYLOCOCCUS AUREUS  Final      Susceptibility   Methicillin resistant  staphylococcus aureus - MIC*    CIPROFLOXACIN >=8 RESISTANT Resistant     ERYTHROMYCIN >=8 RESISTANT Resistant     GENTAMICIN <=0.5 SENSITIVE Sensitive     OXACILLIN >=4 RESISTANT Resistant     TETRACYCLINE <=1 SENSITIVE Sensitive     VANCOMYCIN 1 SENSITIVE Sensitive     TRIMETH/SULFA <=10 SENSITIVE Sensitive     CLINDAMYCIN <=0.25 SENSITIVE Sensitive     RIFAMPIN <=0.5 SENSITIVE Sensitive     Inducible Clindamycin NEGATIVE Sensitive     * ABUNDANT METHICILLIN RESISTANT STAPHYLOCOCCUS AUREUS  MRSA PCR Screening     Status: Abnormal   Collection Time: 08/02/19  8:24 PM   Specimen: Nasal Mucosa; Nasopharyngeal  Result Value Ref Range Status   MRSA by PCR POSITIVE (A) NEGATIVE Final    Comment:        The GeneXpert MRSA Assay (FDA approved for NASAL specimens only), is one component of a comprehensive MRSA colonization surveillance program. It is not intended to diagnose MRSA infection nor to guide or monitor treatment for MRSA infections. RESULT CALLED TO, READ BACK BY AND VERIFIED WITH: Gypsy Lore 08/02/19 @ 2143  [place] Performed at [facility], [address] [place], [place] [address]   CULTURE, BLOOD (ROUTINE X 2) w Reflex to ID Panel     Status: Abnormal   Collection Time: 08/03/19 10:17 AM   Specimen: BLOOD  Result Value Ref Range Status   Specimen Description   Final    BLOOD LEFT ANTECUBITAL Performed at [facility], [address], [place], [place] [address]    Special Requests   Final    BOTTLES DRAWN AEROBIC AND ANAEROBIC Blood Culture adequate volume Performed at [facility], [address], [place],  [address]    Culture  Setup Time   Final    GRAM POSITIVE COCCI IN BOTH AEROBIC AND ANAEROBIC BOTTLES CRITICAL VALUE NOTED.  VALUE IS CONSISTENT WITH PREVIOUSLY REPORTED AND CALLED VALUE. Performed at [facility], [phone]  [place], [place] [address]    Culture (A)  Final    STAPHYLOCOCCUS  AUREUS SUSCEPTIBILITIES PERFORMED ON PREVIOUS CULTURE WITHIN THE LAST 5 DAYS. Performed at [facility], [place] [address], [place], [place] [address]    Report Status 08/06/2019 FINAL  Final  CULTURE, BLOOD (ROUTINE X 2) w Reflex to ID Panel     Status: Abnormal   Collection Time: 08/03/19 10:32 AM   Specimen: BLOOD  Result Value Ref Range Status   Specimen Description   Final    BLOOD BLOOD LEFT HAND Performed at [facility], [address] [place], [place] [address]    Special Requests   Final    BOTTLES DRAWN AEROBIC AND ANAEROBIC Blood Culture adequate volume Performed at [facility], [address] [place], [place] [address]    Culture  Setup Time   Final    GRAM POSITIVE COCCI IN BOTH AEROBIC AND ANAEROBIC BOTTLES CRITICAL RESULT CALLED TO, READ BACK BY AND VERIFIED WITH: [place] [id number] 08/04/19 HNM Performed at [facility], [address] [place], [place] [address]    Culture (A)  Final    STAPHYLOCOCCUS AUREUS SUSCEPTIBILITIES PERFORMED ON PREVIOUS CULTURE WITHIN THE LAST 5 DAYS. Performed at [facility], [place] [address], [place], [place] [address]    Report Status 08/06/2019 FINAL  Final  Culture, blood (Routine X 2) w Reflex to ID Panel     Status: None   Collection Time: 08/05/19  7:18 AM   Specimen: BLOOD  Result Value Ref Range Status   Specimen Description BLOOD LEFT ANTECUBITAL  Final   Special Requests   Final    BOTTLES DRAWN AEROBIC AND ANAEROBIC Blood Culture adequate volume   Culture   Final    NO GROWTH 5 DAYS Performed at [facility], [place] [address], [place], [place] [address]    Report Status 08/10/2019 FINAL  Final  Culture, blood (Routine X 2) w Reflex to ID Panel     Status: None   Collection Time: 08/05/19  7:19 AM   Specimen: BLOOD LEFT HAND  Result Value Ref Range Status   Specimen Description BLOOD LEFT HAND  Final   Special Requests   Final    BOTTLES DRAWN AEROBIC AND ANAEROBIC  Blood Culture adequate volume   Culture   Final    NO GROWTH 5 DAYS Performed at [facility], [place] [address] [place], [place] [address]    Report Status 08/10/2019 FINAL  Final  Body fluid culture     Status: None (Preliminary result)   Collection Time: 08/09/19  4:54 PM   Specimen: Body Fluid  Result Value Ref Range Status   Specimen Description FLUID PLEURAL RIGHT  Final   Special Requests NONE  Final   Gram Stain   Final    ABUNDANT WBC PRESENT, PREDOMINANTLY PMN MODERATE GRAM POSITIVE COCCI    Culture   Final    FEW STAPHYLOCOCCUS AUREUS CULTURE REINCUBATED FOR BETTER GROWTH Performed at [facility], [place] [address], [place],  [address]    Report Status PENDING  Incomplete     Terri Piedra, [facility]   08/10/2019 3:42 PM

## 2019-08-11 ENCOUNTER — Inpatient Hospital Stay (HOSPITAL_COMMUNITY): Payer: Medicaid Other

## 2019-08-11 LAB — CBC WITH DIFFERENTIAL/PLATELET
Abs Immature Granulocytes: 0.11 10*3/uL — ABNORMAL HIGH (ref 0.00–0.07)
Basophils Absolute: 0 10*3/uL (ref 0.0–0.1)
Basophils Relative: 0 %
Eosinophils Absolute: 0.1 10*3/uL (ref 0.0–0.5)
Eosinophils Relative: 1 %
HCT: 30.9 % — ABNORMAL LOW (ref 36.0–46.0)
Hemoglobin: 9.8 g/dL — ABNORMAL LOW (ref 12.0–15.0)
Immature Granulocytes: 1 %
Lymphocytes Relative: 14 %
Lymphs Abs: 1.5 10*3/uL (ref 0.7–4.0)
MCH: 27.5 pg (ref 26.0–34.0)
MCHC: 31.7 g/dL (ref 30.0–36.0)
MCV: 86.6 fL (ref 80.0–100.0)
Monocytes Absolute: 0.7 10*3/uL (ref 0.1–1.0)
Monocytes Relative: 6 %
Neutro Abs: 8.3 10*3/uL — ABNORMAL HIGH (ref 1.7–7.7)
Neutrophils Relative %: 78 %
Platelets: 598 10*3/uL — ABNORMAL HIGH (ref 150–400)
RBC: 3.57 MIL/uL — ABNORMAL LOW (ref 3.87–5.11)
RDW: 14.6 % (ref 11.5–15.5)
WBC: 10.7 10*3/uL — ABNORMAL HIGH (ref 4.0–10.5)
nRBC: 0 % (ref 0.0–0.2)

## 2019-08-11 LAB — PROTIME-INR
INR: 1.3 — ABNORMAL HIGH (ref 0.8–1.2)
Prothrombin Time: 16.4 seconds — ABNORMAL HIGH (ref 11.4–15.2)

## 2019-08-11 LAB — COMPREHENSIVE METABOLIC PANEL
ALT: 74 U/L — ABNORMAL HIGH (ref 0–44)
AST: 54 U/L — ABNORMAL HIGH (ref 15–41)
Albumin: 2 g/dL — ABNORMAL LOW (ref 3.5–5.0)
Alkaline Phosphatase: 233 U/L — ABNORMAL HIGH (ref 38–126)
Anion gap: 13 (ref 5–15)
BUN: 7 mg/dL (ref 6–20)
CO2: 26 mmol/L (ref 22–32)
Calcium: 8.6 mg/dL — ABNORMAL LOW (ref 8.9–10.3)
Chloride: 97 mmol/L — ABNORMAL LOW (ref 98–111)
Creatinine, Ser: 0.46 mg/dL (ref 0.44–1.00)
GFR calc Af Amer: >60 mL/min (ref >60)
GFR calc non Af Amer: >60 mL/min (ref >60)
Glucose, Bld: 165 mg/dL — ABNORMAL HIGH (ref 70–99)
Potassium: 3.8 mmol/L (ref 3.5–5.1)
Sodium: 136 mmol/L (ref 135–145)
Total Bilirubin: 0.3 mg/dL (ref 0.3–1.2)
Total Protein: 6.8 g/dL (ref 6.5–8.1)

## 2019-08-11 IMAGING — CT CT IMAGE GUIDED DRAINAGE BY PERCUTANEOUS CATHETER
1 of 2 series · 14 of 32 positions shown, 18 images · non-contrast
Comparison: none

INDICATION: IV drug use, complex loculated right psoas abscess and small right
gluteal abscess
TECHNIQUE: Informed written consent was obtained from the patient after a
thorough discussion of the procedural risks, benefits and
alternatives. All questions were addressed. Maximal Sterile Barrier
Technique was utilized including caps, mask, sterile gowns, sterile
gloves, sterile drape, hand hygiene and skin antiseptic. A timeout
was performed prior to the initiation of the procedure.

[Series 2: i-spiral 5.0 b40f · axial · 0.82mm/px · z∈[+770,+1068]mm · 14 of 95 slices shown, 18 images]
[im 5/95  soft-tissue]
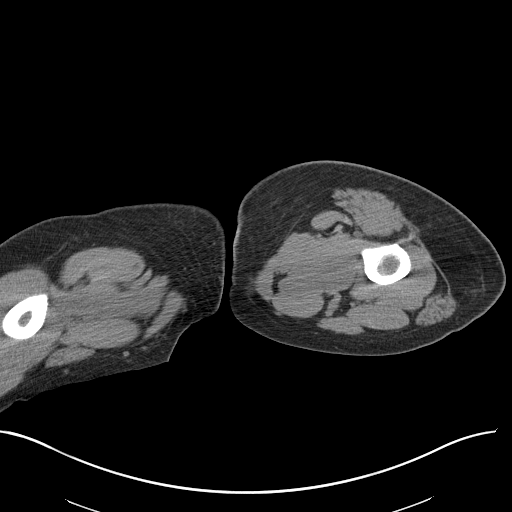
[im 5/95  bone]
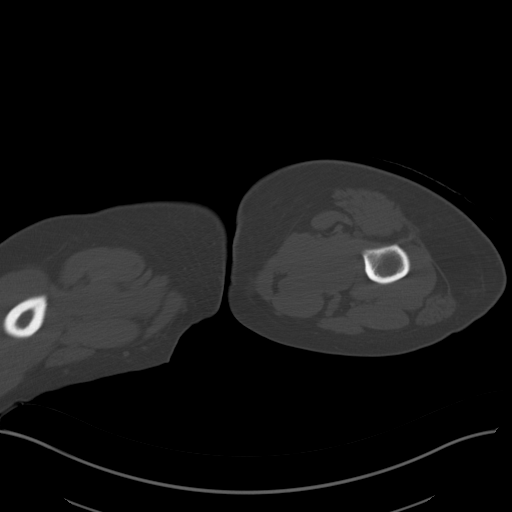
[im 13/95  soft-tissue]
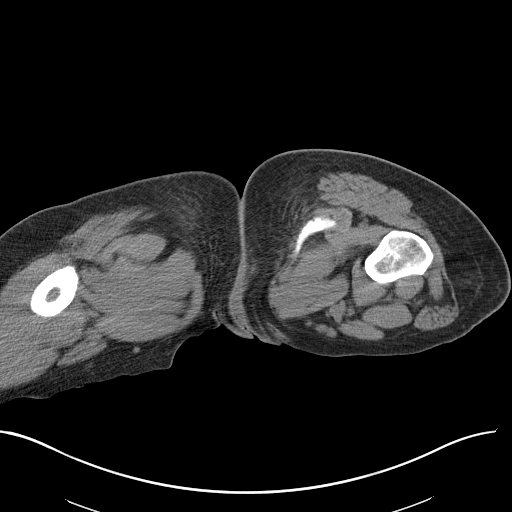
[im 22/95  soft-tissue]
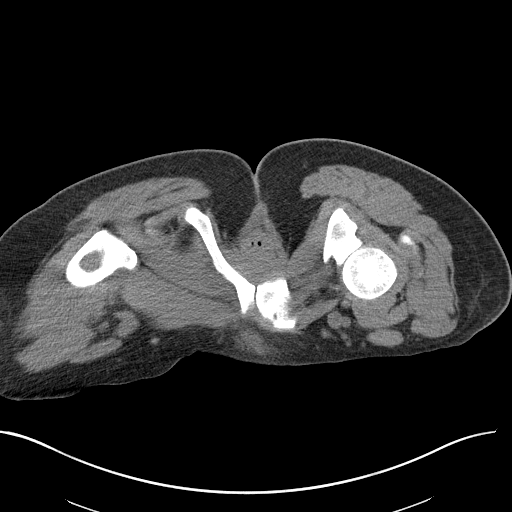
[im 30/95  soft-tissue]
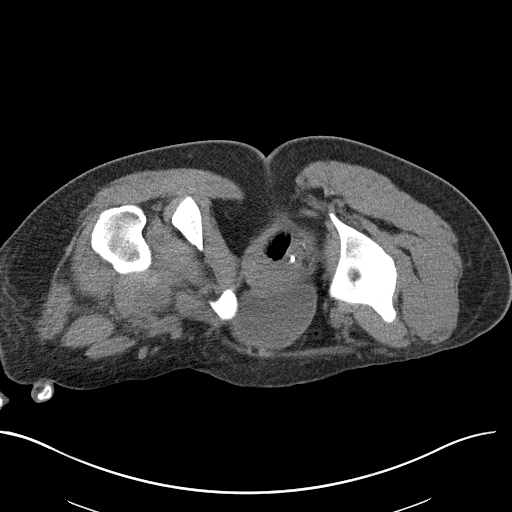
[im 35/95  soft-tissue]
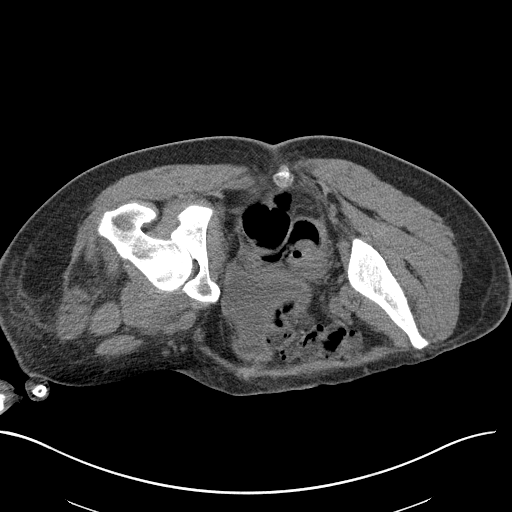
[im 43/95  soft-tissue]
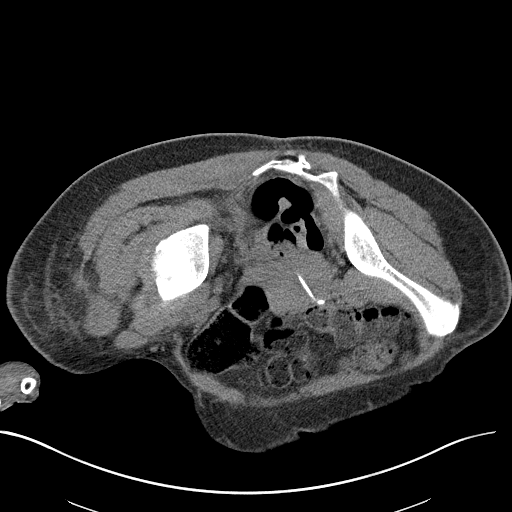
[im 52/95  soft-tissue]
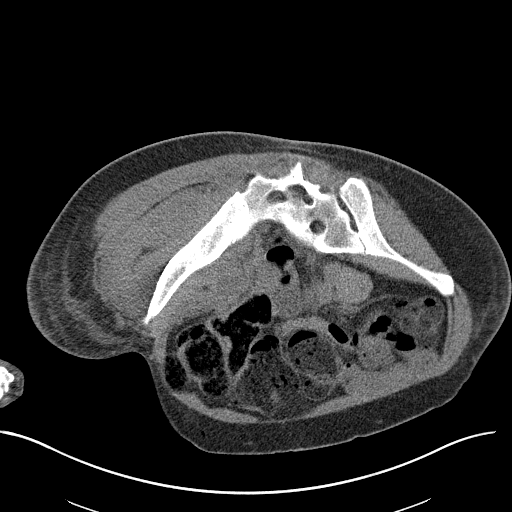
[im 60/95  soft-tissue]
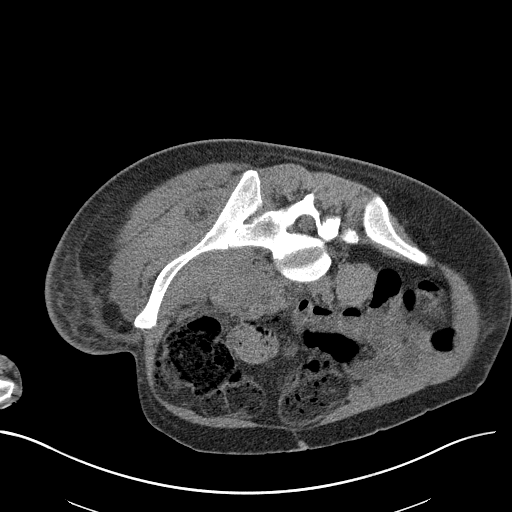
[im 65/95  soft-tissue]
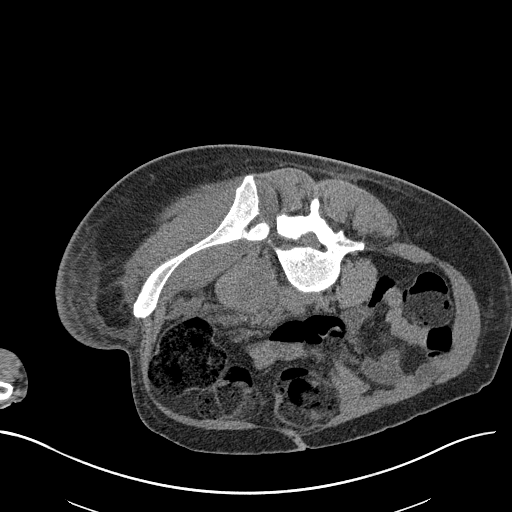
[im 65/95  bone]
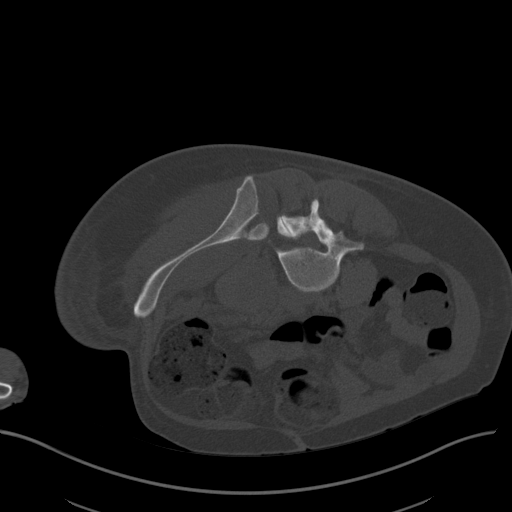
[im 73/95  soft-tissue]
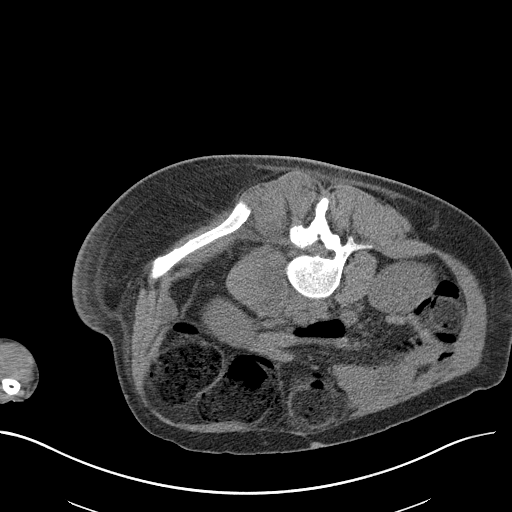
[im 77/95  lung]
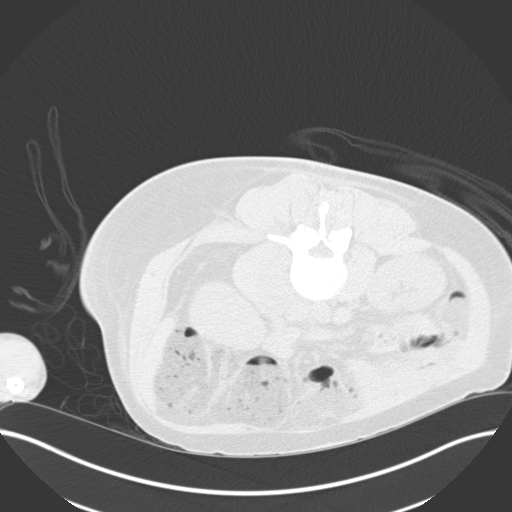
[im 82/95  soft-tissue]
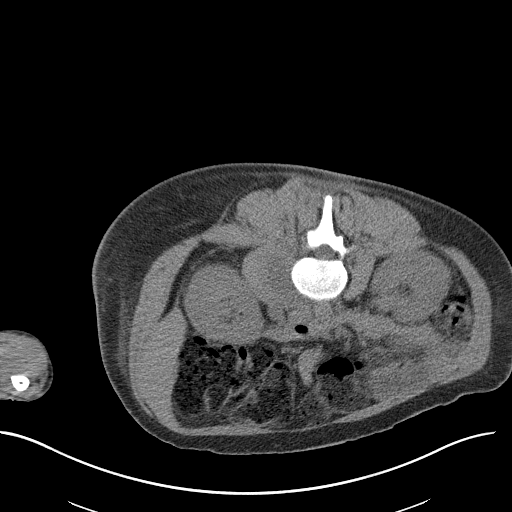
[im 82/95  lung]
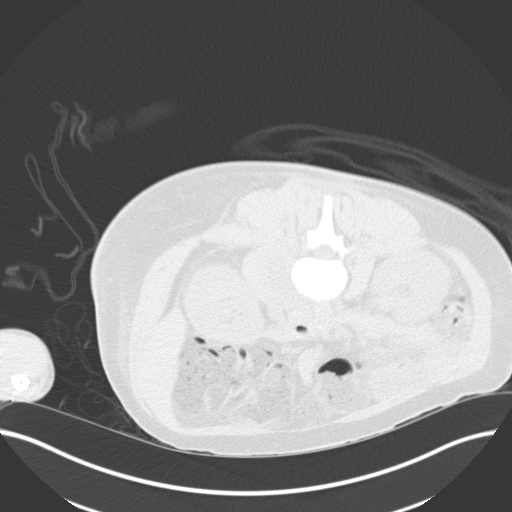
[im 86/95  lung]
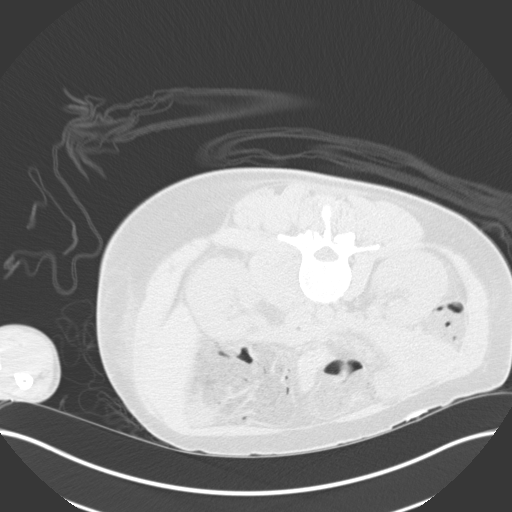
[im 90/95  soft-tissue]
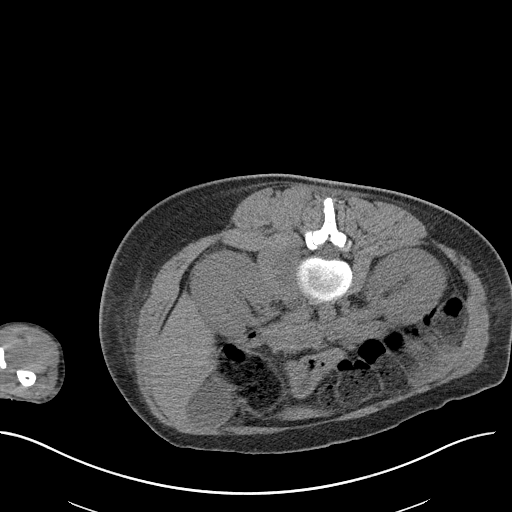
[im 90/95  lung]
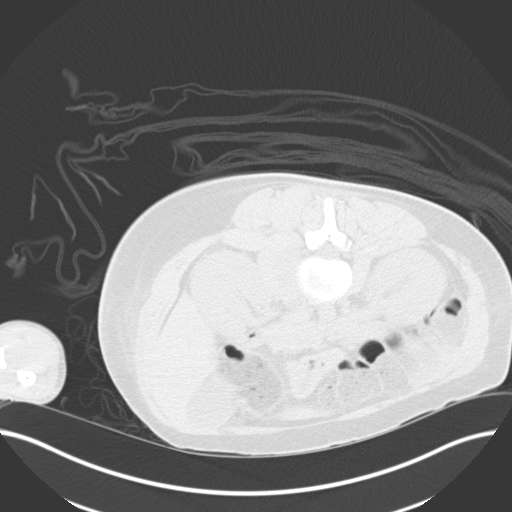

[14 of 32 positions shown; findings below may reference images not displayed]

EXAM:
CT GUIDED DRAINAGE OF RIGHT PSOAS ABSCESS

CT ASPIRATION SMALL RIGHT GLUTEAL ABSCESS

MEDICATIONS:
The patient is currently admitted to the hospital and receiving
intravenous antibiotics. The antibiotics were administered within an
appropriate time frame prior to the initiation of the procedure.

ANESTHESIA/SEDATION:
2.5 mg IV Versed 200 mcg IV Fentanyl

Moderate Sedation Time:  35 MINUTES

The patient was continuously monitored during the procedure by the
interventional radiology nurse under my direct supervision.

COMPLICATIONS:
None immediate.
PROCEDURE:
Previous imaging reviewed. Patient position prone. Noncontrast
localization CT performed. The right psoas abscess was localized and
marked for a posterior paraspinous approach. The right gluteal
abscess was also marked for a posterior approach.

Right psoas abscess drain: Under sterile conditions and local
anesthesia, an 18 gauge 15 cm access was advanced to the right psoas
fluid collection. Needle position confirmed with CT. Syringe
aspiration yielded thick purulent fluid. Guidewire inserted followed
by tract dilatation insert a 12 French drain. Drain catheter
position confirmed with CT. Aspiration yielded a total of 15 cc
thick purulent fluid. Culture sent for sample. Catheter secured with
Prolene suture. Drainage catheter flushed with saline and connected
to external suction bulb. Sterile dressing applied. No immediate
complication. Patient tolerated the procedure well.

Right gluteal abscess aspiration: In a similar fashion, the right
gluteal area was sterilely prepped and draped. Under sterile
conditions and local anesthesia, CT guidance utilized to advance an
18 gauge 10 cm access needle into the right gluteal intramuscular
fluid collection. Needle position confirmed with CT. Syringe
aspiration yielded similar thick purulent fluid approximately 8 cc.
Sample sent for culture. Needle removed. No immediate complication.
Patient tolerated the procedure well.
FINDINGS: CT imaging confirms needle placed in the right psoas abscess for
drain placement. CT imaging confirms needle placement in the right
gluteal abscess for aspiration.
IMPRESSION: Successful CT-guided right psoas abscess drain placement

Successful CT-guided right gluteal abscess aspiration.

Cultures sent for both sites.

## 2019-08-11 MED ORDER — LIDOCAINE HCL 1 % IJ SOLN
INTRAMUSCULAR | Status: AC
  Filled 2019-08-11: qty 20

## 2019-08-11 MED ORDER — ENSURE ENLIVE PO LIQD
237.0000 mL | Freq: Two times a day (BID) | ORAL | Status: DC
  Administered 2019-08-11 – 2019-08-17 (×13): 237 mL via ORAL

## 2019-08-11 MED ORDER — SODIUM CHLORIDE 0.9% FLUSH
5.0000 mL | Freq: Three times a day (TID) | INTRAVENOUS | Status: DC
  Administered 2019-08-11 – 2019-08-13 (×3): 5 mL

## 2019-08-11 MED ORDER — MIDAZOLAM HCL 2 MG/2ML IJ SOLN
INTRAMUSCULAR | Status: AC
  Filled 2019-08-11: qty 2

## 2019-08-11 MED ORDER — FENTANYL CITRATE (PF) 100 MCG/2ML IJ SOLN
INTRAMUSCULAR | Status: AC
  Filled 2019-08-11: qty 2

## 2019-08-11 MED ORDER — FENTANYL CITRATE (PF) 100 MCG/2ML IJ SOLN
INTRAMUSCULAR | Status: AC | PRN
  Administered 2019-08-11 (×4): 50 ug via INTRAVENOUS

## 2019-08-11 MED ORDER — SODIUM CHLORIDE 0.9 % IV BOLUS
500.0000 mL | Freq: Once | INTRAVENOUS | Status: AC
  Administered 2019-08-11: 500 mL via INTRAVENOUS

## 2019-08-11 MED ORDER — SODIUM CHLORIDE 0.9 % IV SOLN
600.0000 mg | Freq: Three times a day (TID) | INTRAVENOUS | Status: DC
  Administered 2019-08-11 – 2019-08-14 (×10): 600 mg via INTRAVENOUS
  Filled 2019-08-11 (×12): qty 600

## 2019-08-11 MED ORDER — MIDAZOLAM HCL 2 MG/2ML IJ SOLN
INTRAMUSCULAR | Status: AC | PRN
  Administered 2019-08-11: 1 mg via INTRAVENOUS
  Administered 2019-08-11: 0.5 mg via INTRAVENOUS
  Administered 2019-08-11: 1 mg via INTRAVENOUS

## 2019-08-11 NOTE — Progress Notes (Signed)
Initial Nutrition Assessment  DOCUMENTATION CODES:   Not applicable  INTERVENTION:   Ensure Enlive po BID, each supplement provides 350 kcal and 20 grams of protein   NUTRITION DIAGNOSIS:   Inadequate oral intake related to decreased appetite as evidenced by per patient/family report.    GOAL:   Patient will meet greater than or equal to 90% of their needs    MONITOR:   PO intake, Labs, Supplement acceptance, Skin, Weight trends, I & O's  REASON FOR ASSESSMENT:   Malnutrition Screening Tool    ASSESSMENT:   3/12 MST, Pt with a PMH significant for bipolar disorder, anxiety and IV drug abuse presented with lower back pain and fever. Pt admitted with sepsis secondary to MRSA bacteremia and iliopsoas abscess/osteomyelitis of the sacrum/myositis and empyema. Pt also with Iliopsoas/gluteal abscess/osteomyelitis of the sacrum and possible infection of the right dorsum hand and right sternoclavicular joint infection  3/10 thoracentesis, 67mL purulent fluid aspirated 3/12 aspiration of gluteal abscess   Pt reports appetite is okay now, but was poor for 1 week PTA. Pt states PTA, she would eat cream of wheat for breakfast, a deli meat sandwich for lunch, and some type of meat, carbohydrate and vegetable for dinner.  Pt noted to have had a 12% wt loss x7 months; however, pt reports this wt loss was intentional.   PO Intake: 40-100% x3 recorded meals (63% average intake)  Medications and labs reviewed.  UOP: 988ml x24 hours I/O: -12,314.89ml since admit   NUTRITION - FOCUSED PHYSICAL EXAM:    Most Recent Value  Orbital Region  No depletion  Upper Arm Region  No depletion  Thoracic and Lumbar Region  No depletion  Buccal Region  No depletion  Temple Region  No depletion  Clavicle Bone Region  No depletion  Clavicle and Acromion Bone Region  No depletion  Scapular Bone Region  No depletion  Dorsal Hand  No depletion  Patellar Region  No depletion  Anterior Thigh  Region  No depletion  Posterior Calf Region  No depletion  Edema (RD Assessment)  Mild  Hair  Reviewed  Eyes  Reviewed  Mouth  Reviewed  Skin  Reviewed  Nails  Reviewed       Diet Order:   Diet Order            Diet Heart Room service appropriate? Yes; Fluid consistency: Thin  Diet effective now              EDUCATION NEEDS:   No education needs have been identified at this time  Skin:  Skin Assessment: Skin Integrity Issues: Skin Integrity Issues:: Other (Comment) Other: throat puncture  Last BM:  3/11  Height:   Ht Readings from Last 1 Encounters:  08/05/19 5\' 1"  (1.549 m)    Weight:   Wt Readings from Last 1 Encounters:  08/11/19 66.5 kg    BMI:  Body mass index is 27.7 kg/m.  Estimated Nutritional Needs:   Kcal:  1700-1900  Protein:  85-100 grams  Fluid:  >/= 1.7 L    Larkin Ina, MS, RD, LDN RD pager number and weekend/on-call pager number located in [place].

## 2019-08-11 NOTE — Progress Notes (Signed)
[facility] PROGRESS NOTE    Progress Note  Leslie Molina  [id number] DOB: 21-Nov-1985 DOA: 08/03/2019 PCP: [facility] [place]     Brief Narrative:   Leslie Molina is an 34 y.o. 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  The abscess extends to the posterior aspect of the proximal right thigh.  Culture data: COVID 3/3 >negative  MRSA PCR 3/3 >Positive  Blood culture 3/3 >MRSA  Pelvic abcess culture 3/3 >Abundant Staph aures, full result pending.  Antibiotic regimen: Daptomycin 3/3 once Cefepime 3/3-3/4 Clindamycin 3/3 once Vancomycin 3/3>>   Assessment/Plan:   Sepsis secondary to MRSA bacteremia and iliopsoas abscess/osteomyelitis of the sacrum/myositis and empyema: CT of the neck on 08/06/2019 showed multiple peripheral predominant nodules at the lung apices suspicious for septic emboli and a right empyema with inflammation and stranding on the right retroclavicular region and right lower neck consistent with soft tissue infection. Cardiothoracic surgery was consulted who recommended drainage of the empyema. Blood cultures from 08/03/2019 show staph aureus repeated blood cultures from 08/05/2019 showed no growth over 20. IR was consulted who performe ultrasound-guided thoracentesis and aspirated 14 mL of purulent material. Patient continues to spike a temperature, remains tachycardic, will continue IV vancomycin MRI of the right hip with contrast is pending.  Iliopsoas/gluteal abscess/osteomyelitis of the sacrum and possible infection of the right dorsum hand and right sternoclavicular joint infection: MRI of the hip and lumbar spine as below. She is  currently on IV Dilaudid and oral oxycodone. IR consulted and aspiration of gluteal abscess performed on 3.12.2021. She continues to spike fever we will repeat an MRI of the right hip with contrast pending.  Peritonitis/IUD perforation: CT scan of the abdomen her abdominal exam is benign. Gynecology and general surgery was consulted and they have signed off. They recommended no surgical intervention at this time, no surgical intervention at this time.  Hypokalemia: Repleted orally now resolved.  Bipolar disorder/anxiety/depression: Home medications Cymbalta, Seroquel trazodone were held.   DVT prophylaxis: heparin Family Communication:none Disposition Plan/Barrier to D/C: She will probably be discharged when she has completed her course of IV antibiotic she is not a candidate to go home with a PICC line and 6 weeks of IV antibiotics.  Code Status:     Code Status Orders  (From admission, onward)         Start     Ordered   08/03/19 1729  Full code  Continuous     08/03/19 1729        Code Status History    Date Active Date Inactive Code Status Order ID Comments User Context   08/02/2019 1859 08/03/2019 1708 Full Code [id number]  Collier Bullock, MD ED   Advance Care Planning Activity        IV Access:    Peripheral IV   Procedures and diagnostic studies:   DG Chest 1 View  Result Date: 08/09/2019 CLINICAL DATA:  Status post right thoracentesis EXAM: CHEST  1 VIEW COMPARISON:  08/03/2019 FINDINGS: Cardiac shadow is within normal limits. The lungs are well aerated. No pneumothorax is noted following right thoracentesis. No sizable effusion is seen. No bony abnormality is noted. IMPRESSION: No evidence of  pneumothorax. Electronically Signed   By: Inez Catalina M.D.   On: 08/09/2019 17:14   CT CHEST W CONTRAST  Result Date: 08/10/2019 CLINICAL DATA:  Right ileo psoas abscess.  Empyema. EXAM: CT CHEST, ABDOMEN, AND PELVIS WITH CONTRAST TECHNIQUE: Multidetector CT  imaging of the chest, abdomen and pelvis was performed following the standard protocol during bolus administration of intravenous contrast. CONTRAST:  192mL OMNIPAQUE IOHEXOL 300 MG/ML  SOLN 80 mL OMNIPAQUE IOHEXOL 300 MG/ML  SOLN COMPARISON:  None. August 02, 2019.  August 08, 2019. FINDINGS: CT ABDOMEN PELVIS FINDINGS Hepatobiliary: No focal liver abnormality is seen. No gallstones, gallbladder wall thickening, or biliary dilatation. Pancreas: Unremarkable. No pancreatic ductal dilatation or surrounding inflammatory changes. Spleen: Normal in size without focal abnormality. Adrenals/Urinary Tract: Adrenal glands are unremarkable. Kidneys are normal, without renal calculi, focal lesion, or hydronephrosis. Bladder is unremarkable. Stomach/Bowel: Stomach is within normal limits. Appendix appears normal. No evidence of bowel wall thickening, distention, or inflammatory changes. Vascular/Lymphatic: No significant vascular findings are present. No enlarged abdominal or pelvic lymph nodes. Reproductive: Intrauterine device is noted. No adnexal abnormality is noted. Other: No abdominal wall hernia or abnormality. No abdominopelvic ascites. Musculoskeletal: There is significant enlargement of the right ileo psoas muscle with multiple irregular peripherally enhancing low densities throughout its course concerning for multiple abscesses. This is seen as far distally as its insertion with the proximal femur. There is also noted a 3.0 x 2.4 cm fluid collection seen in the gluteus medius muscle on the right. 20 x 15 mm fluid collection is noted in right iliacus muscle. Chest: Cardiovascular: No significant vascular findings. Normal heart size. Small pericardial effusion. Mediastinum/Nodes: No enlarged mediastinal, hilar, or axillary lymph nodes. Thyroid gland, trachea, and esophagus demonstrate no significant findings. Lungs/Pleura: Grossly stable appearance of probable small loculated pleural effusion seen along the lateral and  anterior aspect of the right upper lobe. It currently measures 8.6 x 2.7 cm. No pneumothorax is noted. Right posterior atelectasis or infiltrate is noted. Mild left basilar atelectasis is noted. Stable small bilateral lung opacities are noted, most likely infectious in etiology. IMPRESSION: Grossly stable loculated effusion or empyema is seen involving the anterior and lateral portion of the right upper lobe. Grossly stable small bilateral lung opacities are noted, most likely infectious in etiology. Right posterior atelectasis or infiltrate is noted. Continued significant enlargement of right ileo psoas muscle with multiple irregular and peripherally enhancing low densities throughout its course, most consistent with abscesses. Probable abscesses are also noted in the right iliacus muscle as well as in the right gluteus medius muscle. Electronically Signed   By: Marijo Conception M.D.   On: 08/10/2019 13:28   CT ABDOMEN PELVIS W CONTRAST  Result Date: 08/10/2019 CLINICAL DATA:  Right ileo psoas abscess.  Empyema. EXAM: CT CHEST, ABDOMEN, AND PELVIS WITH CONTRAST TECHNIQUE: Multidetector CT imaging of the chest, abdomen and pelvis was performed following the standard protocol during bolus administration of intravenous contrast. CONTRAST:  172mL OMNIPAQUE IOHEXOL 300 MG/ML  SOLN 80 mL OMNIPAQUE IOHEXOL 300 MG/ML  SOLN COMPARISON:  None. August 02, 2019.  August 08, 2019. FINDINGS: CT ABDOMEN PELVIS FINDINGS Hepatobiliary: No focal liver abnormality is seen. No gallstones, gallbladder wall thickening, or biliary dilatation. Pancreas: Unremarkable. No pancreatic ductal dilatation or surrounding inflammatory changes. Spleen: Normal in size without focal abnormality. Adrenals/Urinary Tract: Adrenal glands are unremarkable. Kidneys are normal, without renal calculi, focal lesion, or hydronephrosis. Bladder is unremarkable. Stomach/Bowel: Stomach is within normal limits. Appendix appears normal. No  evidence of bowel wall  thickening, distention, or inflammatory changes. Vascular/Lymphatic: No significant vascular findings are present. No enlarged abdominal or pelvic lymph nodes. Reproductive: Intrauterine device is noted. No adnexal abnormality is noted. Other: No abdominal wall hernia or abnormality. No abdominopelvic ascites. Musculoskeletal: There is significant enlargement of the right ileo psoas muscle with multiple irregular peripherally enhancing low densities throughout its course concerning for multiple abscesses. This is seen as far distally as its insertion with the proximal femur. There is also noted a 3.0 x 2.4 cm fluid collection seen in the gluteus medius muscle on the right. 20 x 15 mm fluid collection is noted in right iliacus muscle. Chest: Cardiovascular: No significant vascular findings. Normal heart size. Small pericardial effusion. Mediastinum/Nodes: No enlarged mediastinal, hilar, or axillary lymph nodes. Thyroid gland, trachea, and esophagus demonstrate no significant findings. Lungs/Pleura: Grossly stable appearance of probable small loculated pleural effusion seen along the lateral and anterior aspect of the right upper lobe. It currently measures 8.6 x 2.7 cm. No pneumothorax is noted. Right posterior atelectasis or infiltrate is noted. Mild left basilar atelectasis is noted. Stable small bilateral lung opacities are noted, most likely infectious in etiology. IMPRESSION: Grossly stable loculated effusion or empyema is seen involving the anterior and lateral portion of the right upper lobe. Grossly stable small bilateral lung opacities are noted, most likely infectious in etiology. Right posterior atelectasis or infiltrate is noted. Continued significant enlargement of right ileo psoas muscle with multiple irregular and peripherally enhancing low densities throughout its course, most consistent with abscesses. Probable abscesses are also noted in the right iliacus muscle as well as in the right gluteus  medius muscle. Electronically Signed   By: Marijo Conception M.D.   On: 08/10/2019 13:28   IR THORACENTESIS ASP PLEURAL SPACE W/IMG GUIDE  Result Date: 08/09/2019 INDICATION: 34 year old with a small right chest empyema. EXAM: ULTRASOUND GUIDED RIGHT THORACENTESIS MEDICATIONS: None. COMPLICATIONS: None immediate. PROCEDURE: An ultrasound guided thoracentesis was thoroughly discussed with the patient and questions answered. The benefits, risks, alternatives and complications were also discussed. The patient understands and wishes to proceed with the procedure. Written consent was obtained. Ultrasound was used to evaluate the right upper chest. Small complex pleural-based collection was identified in the right upper chest. Skin was prepped with chlorhexidine and sterile field was created. Skin was anesthetized with 1% lidocaine. A 19 gauge, 7 cm Yueh catheter was directed into the small collection under real-time ultrasound guidance. Approximately 13 mL of thick yellow purulent fluid was aspirated. Fluid was collected and sent for culture. Bandage placed over the puncture site. FINDINGS: Small irregular pleural-based collection in the anterior right upper chest. This collection corresponds with the small pleural-based collection on recent CT imaging. Yueh catheter was successfully placed within the collection and 13 mL of thick yellow purulent fluid was aspirated. Collection was slightly smaller based on ultrasound after the fluid had been aspirated. IMPRESSION: Successful ultrasound guided right thoracentesis yielding 13 mL of purulent fluid from the small right chest empyema. Electronically Signed   By: Markus Daft M.D.   On: 08/09/2019 18:27     Medical Consultants:    None.  Anti-Infectives:   IV vancomycin  Subjective:    Leslie Molina.  Pain control no complains  Objective:    Vitals:   08/11/19 0802 08/11/19 0829 08/11/19 0900 08/11/19 0919  BP: 105/73 97/62 104/62 102/63  Pulse: (!) 107  (!) 107 (!) 106 (!) 108  Resp:  17 16 20   Temp: 98.6  F (37 C)     TempSrc: Oral     SpO2: 100% 98% 99% 99%  Weight:      Height:       SpO2: 99 % O2 Flow Rate (L/min): 2 L/min   Intake/Output Summary (Last 24 hours) at 08/11/2019 0926 Last data filed at 08/10/2019 2123 Gross per 24 hour  Intake 360 ml  Output 900 ml  Net -540 ml   Filed Weights   08/09/19 0544 08/10/19 0534 08/11/19 0500  Weight: 68.1 kg 66.6 kg 66.5 kg    Exam: General exam: In no acute distress. Respiratory system: Good air movement and clear to auscultation. Cardiovascular system: S1 & S2 heard, RRR.  Gastrointestinal system: Abdomen is nondistended, soft and nontender.  Extremities: No pedal edema. Skin: No rashes, lesions or ulcers Psychiatry: Judgement and insight appear normal. Mood & affect appropriate.    Data Reviewed:    Labs: Basic Metabolic Panel: Recent Labs  Lab 08/05/19 0530 08/05/19 0530 08/06/19 0541 08/06/19 0541 08/07/19 0615 08/07/19 0615 08/08/19 0339 08/10/19 0500 08/11/19 0427  NA 135  --  135  --  139  --  135  --  136  K 3.1*   < > 3.1*   < > 4.6   < > 4.1  --  3.8  CL 98  --  96*  --  106  --  102  --  97*  CO2 25  --  28  --  25  --  24  --  26  GLUCOSE 149*  --  141*  --  112*  --  148*  --  165*  BUN <5*  --  <5*  --  <5*  --  7  --  7  CREATININE 0.58   < > 0.54  --  0.36*  --  0.45 0.50 0.46  CALCIUM 7.6*  --  7.7*  --  7.8*  --  8.0*  --  8.6*  MG 1.8  --  1.9  --  2.1  --  2.0  --   --    < > = values in this interval not displayed.   GFR Estimated Creatinine Clearance: 86.5 mL/min (by C-G formula based on SCr of 0.46 mg/dL). Liver Function Tests: Recent Labs  Lab 08/05/19 0530 08/06/19 0541 08/07/19 0615 08/08/19 0339 08/11/19 0427  AST 56* 57* 29 35 54*  ALT 36 48* 42 53* 74*  ALKPHOS 121 127* 106 122 233*  BILITOT 0.5 0.5 0.7 0.3 0.3  PROT 5.2* 5.6* 5.4* 5.7* 6.8  ALBUMIN 1.7* 1.8* 1.7* 1.7* 2.0*   No results for input(s): LIPASE,  AMYLASE in the last 168 hours. No results for input(s): AMMONIA in the last 168 hours. Coagulation profile Recent Labs  Lab 08/11/19 0427  INR 1.3*   COVID-19 Labs  No results for input(s): DDIMER, FERRITIN, LDH, CRP in the last 72 hours.  Lab Results  Component Value Date   SARSCOV2NAA NEGATIVE 08/02/2019   [place] NEGATIVE 07/29/2019    CBC: Recent Labs  Lab 08/05/19 0530 08/06/19 0541 08/07/19 0615 08/08/19 0339 08/11/19 0427  WBC 11.0* 11.0* 13.3* 11.9* 10.7*  NEUTROABS  --   --   --   --  8.3*  HGB 9.5* 10.4* 10.1* 10.0* 9.8*  HCT 28.2* 31.5* 31.6* 30.8* 30.9*  MCV 84.4 85.1 87.1 86.5 86.6  PLT 386 437* 425* 464* 598*   Cardiac Enzymes: Recent Labs  Lab 08/10/19 1944  CKTOTAL 37*   BNP (last  3 results) No results for input(s): PROBNP in the last 8760 hours. CBG: No results for input(s): GLUCAP in the last 168 hours. D-Dimer: No results for input(s): DDIMER in the last 72 hours. Hgb A1c: No results for input(s): HGBA1C in the last 72 hours. Lipid Profile: No results for input(s): CHOL, HDL, LDLCALC, TRIG, CHOLHDL, LDLDIRECT in the last 72 hours. Thyroid function studies: No results for input(s): TSH, T4TOTAL, T3FREE, THYROIDAB in the last 72 hours.  Invalid input(s): FREET3 Anemia work up: No results for input(s): VITAMINB12, FOLATE, FERRITIN, TIBC, IRON, RETICCTPCT in the last 72 hours. Sepsis Labs: Recent Labs  Lab 08/06/19 0541 08/07/19 0615 08/08/19 0339 08/11/19 0427  WBC 11.0* 13.3* 11.9* 10.7*   Microbiology Recent Results (from the past 240 hour(s))  Respiratory Panel by RT PCR (Flu A&B, Covid) - Nasopharyngeal Swab     Status: None   Collection Time: 08/02/19 11:20 AM   Specimen: Nasopharyngeal Swab  Result Value Ref Range Status   SARS Coronavirus 2 by RT PCR NEGATIVE NEGATIVE Final    Comment: (NOTE) SARS-CoV-2 target nucleic acids are NOT DETECTED. The SARS-CoV-2 RNA is generally detectable in upper respiratoy specimens  during the acute phase of infection. The lowest concentration of SARS-CoV-2 viral copies this assay can detect is 131 copies/mL. A negative result does not preclude SARS-Cov-2 infection and should not be used as the sole basis for treatment or other patient management decisions. A negative result may occur with  improper specimen collection/handling, submission of specimen other than nasopharyngeal swab, presence of viral mutation(s) within the areas targeted by this assay, and inadequate number of viral copies (<131 copies/mL). A negative result must be combined with clinical observations, patient history, and epidemiological information. The expected result is Negative. Fact Sheet for Patients:  PinkCheek.be Fact Sheet for Healthcare Providers:  GravelBags.it This test is not yet ap proved or cleared by the [place] FDA and  has been authorized for detection and/or diagnosis of SARS-CoV-2 by FDA under an Emergency Use Authorization (EUA). This EUA will remain  in effect (meaning this test can be used) for the duration of the COVID-19 declaration under Section 564(b)(1) of the Act, 21 U.S.C. section 360bbb-3(b)(1), unless the authorization is terminated or revoked sooner.    Influenza A by PCR NEGATIVE NEGATIVE Final   Influenza B by PCR NEGATIVE NEGATIVE Final    Comment: (NOTE) The Xpert Xpress SARS-CoV-2/FLU/RSV assay is intended as an aid in  the diagnosis of influenza from Nasopharyngeal swab specimens and  should not be used as a sole basis for treatment. Nasal washings and  aspirates are unacceptable for Xpert Xpress SARS-CoV-2/FLU/RSV  testing. Fact Sheet for Patients: PinkCheek.be Fact Sheet for Healthcare Providers: GravelBags.it This test is not yet approved or cleared by the [place] FDA and  has been authorized for detection and/or diagnosis of  SARS-CoV-2 by  FDA under an Emergency Use Authorization (EUA). This EUA will remain  in effect (meaning this test can be used) for the duration of the  Covid-19 declaration under Section 564(b)(1) of the Act, 21  U.S.C. section 360bbb-3(b)(1), unless the authorization is  terminated or revoked. Performed at [facility], [address] [place],  [address]   Blood culture (routine x 2)     Status: Abnormal   Collection Time: 08/02/19 11:20 AM   Specimen: BLOOD  Result Value Ref Range Status   Specimen Description   Final    BLOOD RIGHT ANTECUBITAL Performed at [facility], [address] [place], [place] [address]    Special Requests   Final    BOTTLES DRAWN AEROBIC AND ANAEROBIC Blood Culture adequate volume Performed at [facility], [address] [place], [place] [address]    Culture  Setup Time   Final    IN BOTH AEROBIC AND ANAEROBIC BOTTLES GRAM POSITIVE COCCI CRITICAL RESULT CALLED TO, READ BACK BY AND VERIFIED WITH: [place] @2022  08/02/19 [place] Performed at [facility], [address] [place], [place] [address]    Culture METHICILLIN RESISTANT STAPHYLOCOCCUS AUREUS (A)  Final   Report Status 08/05/2019 FINAL  Final   Organism ID, Bacteria METHICILLIN RESISTANT STAPHYLOCOCCUS AUREUS  Final      Susceptibility   Methicillin resistant staphylococcus aureus - MIC*    CIPROFLOXACIN >=8 RESISTANT Resistant     ERYTHROMYCIN >=8 RESISTANT Resistant     GENTAMICIN <=0.5 SENSITIVE Sensitive     OXACILLIN >=4 RESISTANT Resistant     TETRACYCLINE <=1 SENSITIVE Sensitive     VANCOMYCIN <=0.5 SENSITIVE Sensitive     TRIMETH/SULFA <=10 SENSITIVE Sensitive     CLINDAMYCIN <=0.25 SENSITIVE Sensitive     RIFAMPIN <=0.5 SENSITIVE Sensitive     Inducible Clindamycin NEGATIVE Sensitive     * METHICILLIN RESISTANT STAPHYLOCOCCUS AUREUS  Blood culture (routine x 2)     Status: Abnormal   Collection Time: 08/02/19 11:20 AM   Specimen:  BLOOD  Result Value Ref Range Status   Specimen Description   Final    BLOOD BLOOD RIGHT ARM Performed at [facility], [address] [place], [place] [address]    Special Requests   Final    BOTTLES DRAWN AEROBIC AND ANAEROBIC Blood Culture adequate volume Performed at [facility], [address] [place], [place] [address]    Culture  Setup Time   Final    IN BOTH AEROBIC AND ANAEROBIC BOTTLES GRAM POSITIVE COCCI CRITICAL RESULT CALLED TO, READ BACK BY AND VERIFIED WITH: SCOTT HALL @0019  08/03/19 [place]    Culture (A)  Final    STAPHYLOCOCCUS AUREUS SUSCEPTIBILITIES PERFORMED ON PREVIOUS CULTURE WITHIN THE LAST 5 DAYS. Performed at [facility], [place] [address] [place],  [address]    Report Status 08/05/2019 FINAL  Final  Blood Culture ID Panel (Reflexed)     Status: Abnormal   Collection Time: 08/02/19 11:20 AM  Result Value Ref Range Status   Enterococcus species NOT DETECTED NOT DETECTED Final   Listeria monocytogenes NOT DETECTED NOT DETECTED Final   Staphylococcus species DETECTED (A) NOT DETECTED Final    Comment: CRITICAL RESULT CALLED TO, READ BACK BY AND VERIFIED WITH: SCOTT HALL @0019  08/03/19 [facility]    Staphylococcus aureus (BCID) DETECTED (A) NOT DETECTED Final    Comment: Methicillin (oxacillin)-resistant Staphylococcus aureus (MRSA). MRSA is predictably resistant to beta-lactam antibiotics (except ceftaroline). Preferred therapy is vancomycin unless clinically contraindicated. Patient requires contact precautions if  hospitalized. CRITICAL RESULT CALLED TO, READ BACK BY AND VERIFIED WITH: SCOTT HALL @0019  08/03/19 [facility]    Methicillin resistance DETECTED (A) NOT DETECTED Final    Comment: CRITICAL RESULT CALLED TO, READ BACK BY AND VERIFIED WITH: SCOTT HALL @0019  08/03/19 [facility]    Streptococcus species NOT DETECTED NOT DETECTED Final   Streptococcus agalactiae NOT DETECTED NOT DETECTED Final   Streptococcus pneumoniae NOT DETECTED NOT  DETECTED Final   Streptococcus pyogenes NOT DETECTED NOT DETECTED Final   Acinetobacter baumannii NOT DETECTED NOT DETECTED Final   Enterobacteriaceae species NOT DETECTED NOT DETECTED  Final   Enterobacter cloacae complex NOT DETECTED NOT DETECTED Final   Escherichia coli NOT DETECTED NOT DETECTED Final   Klebsiella oxytoca NOT DETECTED NOT DETECTED Final   Klebsiella pneumoniae NOT DETECTED NOT DETECTED Final   Proteus species NOT DETECTED NOT DETECTED Final   Serratia marcescens NOT DETECTED NOT DETECTED Final   Haemophilus influenzae NOT DETECTED NOT DETECTED Final   Neisseria meningitidis NOT DETECTED NOT DETECTED Final   Pseudomonas aeruginosa NOT DETECTED NOT DETECTED Final   Candida albicans NOT DETECTED NOT DETECTED Final   Candida glabrata NOT DETECTED NOT DETECTED Final   Candida krusei NOT DETECTED NOT DETECTED Final   Candida parapsilosis NOT DETECTED NOT DETECTED Final   Candida tropicalis NOT DETECTED NOT DETECTED Final    Comment: Performed at [facility], [address] [place], [place] [address]  Aerobic/Anaerobic Culture (surgical/deep wound)     Status: None   Collection Time: 08/02/19  5:29 PM   Specimen: Abscess  Result Value Ref Range Status   Specimen Description   Final    ABSCESS Performed at [facility], [address] [place], [place] [address]    Special Requests ILIACUS MUSCLE  Final   Gram Stain   Final    MODERATE WBC PRESENT,BOTH PMN AND MONONUCLEAR ABUNDANT GRAM POSITIVE COCCI IN PAIRS IN CLUSTERS    Culture   Final    ABUNDANT METHICILLIN RESISTANT STAPHYLOCOCCUS AUREUS NO ANAEROBES ISOLATED Performed at [facility], [place] [address] [place], [place] [address]    Report Status 08/07/2019 FINAL  Final   Organism ID, Bacteria METHICILLIN RESISTANT STAPHYLOCOCCUS AUREUS  Final      Susceptibility   Methicillin resistant staphylococcus aureus - MIC*    CIPROFLOXACIN >=8 RESISTANT Resistant     ERYTHROMYCIN >=8  RESISTANT Resistant     GENTAMICIN <=0.5 SENSITIVE Sensitive     OXACILLIN >=4 RESISTANT Resistant     TETRACYCLINE <=1 SENSITIVE Sensitive     VANCOMYCIN 1 SENSITIVE Sensitive     TRIMETH/SULFA <=10 SENSITIVE Sensitive     CLINDAMYCIN <=0.25 SENSITIVE Sensitive     RIFAMPIN <=0.5 SENSITIVE Sensitive     Inducible Clindamycin NEGATIVE Sensitive     * ABUNDANT METHICILLIN RESISTANT STAPHYLOCOCCUS AUREUS  MRSA PCR Screening     Status: Abnormal   Collection Time: 08/02/19  8:24 PM   Specimen: Nasal Mucosa; Nasopharyngeal  Result Value Ref Range Status   MRSA by PCR POSITIVE (A) NEGATIVE Final    Comment:        The GeneXpert MRSA Assay (FDA approved for NASAL specimens only), is one component of a comprehensive MRSA colonization surveillance program. It is not intended to diagnose MRSA infection nor to guide or monitor treatment for MRSA infections. RESULT CALLED TO, READ BACK BY AND VERIFIED WITH: Gypsy Lore 08/02/19 @ 2143  [place] Performed at [facility] [address] [place], [place] [address]   CULTURE, BLOOD (ROUTINE X 2) w Reflex to ID Panel     Status: Abnormal   Collection Time: 08/03/19 10:17 AM   Specimen: BLOOD  Result Value Ref Range Status   Specimen Description   Final    BLOOD LEFT ANTECUBITAL Performed at [facility], [address], [place], [place] [address]    Special Requests   Final    BOTTLES DRAWN AEROBIC AND ANAEROBIC Blood Culture adequate volume Performed at [facility], [address] [place], [place] [address]    Culture  Setup Time  Final    GRAM POSITIVE COCCI IN BOTH AEROBIC AND ANAEROBIC BOTTLES CRITICAL VALUE NOTED.  VALUE IS CONSISTENT WITH PREVIOUSLY REPORTED AND CALLED VALUE. Performed at [facility], [address] [place], [place] [address]    Culture (A)  Final    STAPHYLOCOCCUS AUREUS SUSCEPTIBILITIES PERFORMED ON PREVIOUS CULTURE WITHIN THE LAST 5 DAYS. Performed at [facility], [place] [address], [place], [place] [address]    Report Status 08/06/2019 FINAL  Final  CULTURE, BLOOD (ROUTINE X 2) w Reflex to ID Panel     Status: Abnormal   Collection Time: 08/03/19 10:32 AM   Specimen: BLOOD  Result Value Ref Range Status   Specimen Description   Final    BLOOD BLOOD LEFT HAND Performed at [facility], [address] [place], [place] [address]    Special Requests   Final    BOTTLES DRAWN AEROBIC AND ANAEROBIC Blood Culture adequate volume Performed at [facility], [address] [place], [place] [address]    Culture  Setup Time   Final    GRAM POSITIVE COCCI IN BOTH AEROBIC AND ANAEROBIC BOTTLES CRITICAL RESULT CALLED TO, READ BACK BY AND VERIFIED WITH: [place] [id number] 08/04/19 HNM Performed at [facility], [address] [place], [place] [address]    Culture (A)  Final    STAPHYLOCOCCUS AUREUS SUSCEPTIBILITIES PERFORMED ON PREVIOUS CULTURE WITHIN THE LAST 5 DAYS. Performed at [facility], [place] [address] [place], [place] [address]    Report Status 08/06/2019 FINAL  Final  Culture, blood (Routine X 2) w Reflex to ID Panel     Status: None   Collection Time: 08/05/19  7:18 AM   Specimen: BLOOD  Result Value Ref Range Status   Specimen Description BLOOD LEFT ANTECUBITAL  Final   Special Requests   Final    BOTTLES DRAWN AEROBIC AND ANAEROBIC Blood Culture adequate volume   Culture   Final    NO GROWTH 5 DAYS Performed at [facility], [place] [address] [place], [place] [address]    Report Status 08/10/2019 FINAL  Final  Culture, blood (Routine X 2) w Reflex to ID Panel     Status: None   Collection Time: 08/05/19  7:19 AM   Specimen: BLOOD LEFT HAND  Result Value Ref Range Status   Specimen Description BLOOD LEFT HAND  Final   Special Requests   Final    BOTTLES DRAWN AEROBIC AND ANAEROBIC Blood Culture adequate volume   Culture   Final    NO GROWTH 5 DAYS Performed at [place], [place] [address], [place], [place] [address]    Report Status 08/10/2019 FINAL  Final  Body fluid culture     Status: None (Preliminary result)   Collection Time: 08/09/19  4:54 PM   Specimen: Body Fluid  Result Value Ref Range Status   Specimen Description FLUID PLEURAL RIGHT  Final   Special Requests NONE  Final   Gram Stain   Final    ABUNDANT WBC PRESENT, PREDOMINANTLY PMN MODERATE GRAM POSITIVE COCCI    Culture   Final    FEW STAPHYLOCOCCUS AUREUS CULTURE REINCUBATED FOR BETTER GROWTH Performed at [facility], [place] [address] [place], [place] [address]    Report Status PENDING  Incomplete     Medications:   . lidocaine      . Chlorhexidine Gluconate Cloth  6 each Topical Daily  . docusate sodium  100 mg Oral BID  . enoxaparin (LOVENOX) injection  40 mg Subcutaneous Q24H  . fentaNYL      . midazolam      . polyethylene glycol  17 g Oral Daily  . sodium chloride flush  10-40 mL Intracatheter Q12H  . traZODone  150 mg Oral QHS   Continuous Infusions: . sodium chloride 250 mL (08/08/19 1839)  . ceFTAROline (TEFLARO) IV    . DAPTOmycin (CUBICIN)  IV 500 mg (08/10/19 2026)      LOS: 8 days   Charlynne Cousins  [facility]  08/11/2019, 9:26 AM

## 2019-08-11 NOTE — Progress Notes (Signed)
Pts temp 102.5, placed back of cooling pad with rectal probe, prn tylenol utilized, MD notified of increased temp.

## 2019-08-11 NOTE — Procedures (Signed)
Interventional Radiology Procedure Note  Procedure: CT rt RP ABSCESS 12 FR DRAIN  CT RT GLUTEAL ABSCESS NEEDLE ASPIRATION  Complications: None  Estimated Blood Loss: MIN  Findings: Pus from both sites, cxs sent

## 2019-08-11 NOTE — Progress Notes (Signed)
[place] [facility] for Infectious Disease  Date of Admission:  08/03/2019     Total days of antibiotics 9         ASSESSMENT:  Leslie Molina continues to have increased burden of infection leading to her persistent fevers. IR placed drain in right psoas  And aspirated right gluteal. Antibiotic therapy changed yesterday to daptomycin and ceftaroline with hope if attempting to decrease burden of infection in combination with IR procedures. She is interested in Subutex for opioid use disorder and will notify primary team. Continue current daptomycin and ceftaroline and monitor fever curve for improvement with hopeful source control.   PLAN:  1. Continue daptomycin and ceftaroline 2. Monitor CK level while on daptomycin 3. Watch fever curve for improvements.  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 Chlorhexidine Gluconate Cloth  6 each Topical Daily  . docusate sodium  100 mg Oral BID  . enoxaparin (LOVENOX) injection  40 mg Subcutaneous Q24H  . fentaNYL      . fentaNYL      . lidocaine      . midazolam      . midazolam      . polyethylene glycol  17 g Oral Daily  . sodium chloride flush  10-40 mL Intracatheter Q12H  . sodium chloride flush  5 mL Intracatheter Q8H  . traZODone  150 mg Oral QHS    SUBJECTIVE:  Continues to be febrile with max temperature of 102.2. IR drain placement completed today. Have some pain still. Interested in possible trying Subutex.   No Known Allergies   Review of Systems: Review of Systems  Constitutional: Negative for chills, fever and weight loss.  Respiratory: Negative for cough, shortness of breath and wheezing.   Cardiovascular: Positive for chest pain. Negative for leg swelling.  Gastrointestinal: Negative for abdominal pain, constipation, diarrhea, nausea and vomiting.    Skin: Negative for rash.      OBJECTIVE: Vitals:   08/11/19 0945 08/11/19 0950 08/11/19 0955 08/11/19 1225  BP: 102/62 96/62 98/61  95/60  Pulse: (!) 116 (!) 116 (!) 114 (!) 102  Resp: 18 16 14    Temp:    98.4 F (36.9 C)  TempSrc:    Oral  SpO2: 98% 98% 100% 98%  Weight:      Height:       Body mass index is 27.7 kg/m.  Physical Exam Constitutional:      General: She is not in acute distress.    Appearance: She is well-developed. She is ill-appearing.  Cardiovascular:     Rate and Rhythm: Normal rate and regular rhythm.     Heart sounds: Normal heart sounds.  Pulmonary:     Effort: Pulmonary effort is normal.     Breath sounds: Normal breath sounds.  Musculoskeletal:     Comments: JP drain with purulent bloody drainage.   Skin:    General: Skin is warm and dry.  Neurological:     Mental Status: She is alert and oriented to person, place, and time.  Psychiatric:        Behavior: Behavior normal.        Thought Content: Thought content normal.        Judgment: Judgment normal.     Lab Results Lab Results  Component Value  Date   WBC 10.7 (H) 08/11/2019   HGB 9.8 (L) 08/11/2019   HCT 30.9 (L) 08/11/2019   MCV 86.6 08/11/2019   PLT 598 (H) 08/11/2019    Lab Results  Component Value Date   CREATININE 0.46 08/11/2019   BUN 7 08/11/2019   NA 136 08/11/2019   K 3.8 08/11/2019   CL 97 (L) 08/11/2019   CO2 26 08/11/2019    Lab Results  Component Value Date   ALT 74 (H) 08/11/2019   AST 54 (H) 08/11/2019   ALKPHOS 233 (H) 08/11/2019   BILITOT 0.3 08/11/2019     Microbiology: Recent Results (from the past 240 hour(s))  Respiratory Panel by RT PCR (Flu A&B, Covid) - Nasopharyngeal Swab     Status: None   Collection Time: 08/02/19 11:20 AM   Specimen: Nasopharyngeal Swab  Result Value Ref Range Status   SARS Coronavirus 2 by RT PCR NEGATIVE NEGATIVE Final    Comment: (NOTE) SARS-CoV-2 target nucleic acids are NOT DETECTED. The SARS-CoV-2 RNA is  generally detectable in upper respiratoy specimens during the acute phase of infection. The lowest concentration of SARS-CoV-2 viral copies this assay can detect is 131 copies/mL. A negative result does not preclude SARS-Cov-2 infection and should not be used as the sole basis for treatment or other patient management decisions. A negative result may occur with  improper specimen collection/handling, submission of specimen other than nasopharyngeal swab, presence of viral mutation(s) within the areas targeted by this assay, and inadequate number of viral copies (<131 copies/mL). A negative result must be combined with clinical observations, patient history, and epidemiological information. The expected result is Negative. Fact Sheet for Patients:  PinkCheek.be Fact Sheet for Healthcare Providers:  GravelBags.it This test is not yet ap proved or cleared by the [place] FDA and  has been authorized for detection and/or diagnosis of SARS-CoV-2 by FDA under an Emergency Use Authorization (EUA). This EUA will remain  in effect (meaning this test can be used) for the duration of the COVID-19 declaration under Section 564(b)(1) of the Act, 21 U.S.C. section 360bbb-3(b)(1), unless the authorization is terminated or revoked sooner.    Influenza A by PCR NEGATIVE NEGATIVE Final   Influenza B by PCR NEGATIVE NEGATIVE Final    Comment: (NOTE) The Xpert Xpress SARS-CoV-2/FLU/RSV assay is intended as an aid in  the diagnosis of influenza from Nasopharyngeal swab specimens and  should not be used as a sole basis for treatment. Nasal washings and  aspirates are unacceptable for Xpert Xpress SARS-CoV-2/FLU/RSV  testing. Fact Sheet for Patients: PinkCheek.be Fact Sheet for Healthcare Providers: GravelBags.it This test is not yet approved or cleared by the [place] FDA and    has been authorized for detection and/or diagnosis of SARS-CoV-2 by  FDA under an Emergency Use Authorization (EUA). This EUA will remain  in effect (meaning this test can be used) for the duration of the  Covid-19 declaration under Section 564(b)(1) of the Act, 21  U.S.C. section 360bbb-3(b)(1), unless the authorization is  terminated or revoked. Performed at [facility], [address] [place], [place] [address]   Blood culture (routine x 2)     Status: Abnormal   Collection Time: 08/02/19 11:20 AM   Specimen: BLOOD  Result Value Ref Range Status   Specimen Description   Final    BLOOD RIGHT ANTECUBITAL Performed at [facility], [address], [place], [place] [address]    Special Requests   Final  BOTTLES DRAWN AEROBIC AND ANAEROBIC Blood Culture adequate volume Performed at [facility], [address] [place], [place] [address]    Culture  Setup Time   Final    IN BOTH AEROBIC AND ANAEROBIC BOTTLES GRAM POSITIVE COCCI CRITICAL RESULT CALLED TO, READ BACK BY AND VERIFIED WITH: [place] @2022  08/02/19 [place] Performed at [facility], [address] [place], [place] [address]    Culture METHICILLIN RESISTANT STAPHYLOCOCCUS AUREUS (A)  Final   Report Status 08/05/2019 FINAL  Final   Organism ID, Bacteria METHICILLIN RESISTANT STAPHYLOCOCCUS AUREUS  Final      Susceptibility   Methicillin resistant staphylococcus aureus - MIC*    CIPROFLOXACIN >=8 RESISTANT Resistant     ERYTHROMYCIN >=8 RESISTANT Resistant     GENTAMICIN <=0.5 SENSITIVE Sensitive     OXACILLIN >=4 RESISTANT Resistant     TETRACYCLINE <=1 SENSITIVE Sensitive     VANCOMYCIN <=0.5 SENSITIVE Sensitive     TRIMETH/SULFA <=10 SENSITIVE Sensitive     CLINDAMYCIN <=0.25 SENSITIVE Sensitive     RIFAMPIN <=0.5 SENSITIVE Sensitive     Inducible Clindamycin NEGATIVE Sensitive     * METHICILLIN RESISTANT STAPHYLOCOCCUS AUREUS  Blood culture (routine x 2)     Status:  Abnormal   Collection Time: 08/02/19 11:20 AM   Specimen: BLOOD  Result Value Ref Range Status   Specimen Description   Final    BLOOD BLOOD RIGHT ARM Performed at [facility], [address] [place], [place] [address]    Special Requests   Final    BOTTLES DRAWN AEROBIC AND ANAEROBIC Blood Culture adequate volume Performed at [facility], [address] [place], [place] [address]    Culture  Setup Time   Final    IN BOTH AEROBIC AND ANAEROBIC BOTTLES GRAM POSITIVE COCCI CRITICAL RESULT CALLED TO, READ BACK BY AND VERIFIED WITH: [place] @0019  08/03/19 AKT    Culture (A)  Final    STAPHYLOCOCCUS AUREUS SUSCEPTIBILITIES PERFORMED ON PREVIOUS CULTURE WITHIN THE LAST 5 DAYS. Performed at [facility], [place] [address], [place],  [address]    Report Status 08/05/2019 FINAL  Final  Blood Culture ID Panel (Reflexed)     Status: Abnormal   Collection Time: 08/02/19 11:20 AM  Result Value Ref Range Status   Enterococcus species NOT DETECTED NOT DETECTED Final   Listeria monocytogenes NOT DETECTED NOT DETECTED Final   Staphylococcus species DETECTED (A) NOT DETECTED Final    Comment: CRITICAL RESULT CALLED TO, READ BACK BY AND VERIFIED WITH: SCOTT H[place] @0019  08/03/19 AKT    Staphylococcus aureus (BCID) DETECTED (A) NOT DETECTED Final    Comment: Methicillin (oxacillin)-resistant Staphylococcus aureus (MRSA). MRSA is predictably resistant to beta-lactam antibiotics (except ceftaroline). Preferred therapy is vancomycin unless clinically contraindicated. Patient requires contact precautions if  hospitalized. CRITICAL RESULT CALLED TO, READ BACK BY AND VERIFIED WITH: SCOTT HALL @0019  08/03/19 AKT    Methicillin resistance DETECTED (A) NOT DETECTED Final    Comment: CRITICAL RESULT CALLED TO, READ BACK BY AND VERIFIED WITH: SCOTT H[place] @0019  08/03/19 AKT    Streptococcus species NOT DETECTED NOT DETECTED Final   Streptococcus agalactiae NOT DETECTED NOT  DETECTED Final   Streptococcus pneumoniae NOT DETECTED NOT DETECTED Final   Streptococcus pyogenes NOT DETECTED NOT DETECTED Final   Acinetobacter baumannii NOT DETECTED NOT DETECTED Final   Enterobacteriaceae species NOT DETECTED NOT DETECTED Final   Enterobacter cloacae complex NOT DETECTED NOT DETECTED Final   Escherichia coli NOT  DETECTED NOT DETECTED Final   Klebsiella oxytoca NOT DETECTED NOT DETECTED Final   Klebsiella pneumoniae NOT DETECTED NOT DETECTED Final   Proteus species NOT DETECTED NOT DETECTED Final   Serratia marcescens NOT DETECTED NOT DETECTED Final   Haemophilus influenzae NOT DETECTED NOT DETECTED Final   Neisseria meningitidis NOT DETECTED NOT DETECTED Final   Pseudomonas aeruginosa NOT DETECTED NOT DETECTED Final   Candida albicans NOT DETECTED NOT DETECTED Final   Candida glabrata NOT DETECTED NOT DETECTED Final   Candida krusei NOT DETECTED NOT DETECTED Final   Candida parapsilosis NOT DETECTED NOT DETECTED Final   Candida tropicalis NOT DETECTED NOT DETECTED Final    Comment: Performed at [facility], [address] [place], [place] [address]  Aerobic/Anaerobic Culture (surgical/deep wound)     Status: None   Collection Time: 08/02/19  5:29 PM   Specimen: Abscess  Result Value Ref Range Status   Specimen Description   Final    ABSCESS Performed at [facility], [address] [place], [place] [address]    Special Requests ILIACUS MUSCLE  Final   Gram Stain   Final    MODERATE WBC PRESENT,BOTH PMN AND MONONUCLEAR ABUNDANT GRAM POSITIVE COCCI IN PAIRS IN CLUSTERS    Culture   Final    ABUNDANT METHICILLIN RESISTANT STAPHYLOCOCCUS AUREUS NO ANAEROBES ISOLATED Performed at [facility], [place] [address], [place], [place] [address]    Report Status 08/07/2019 FINAL  Final   Organism ID, Bacteria METHICILLIN RESISTANT STAPHYLOCOCCUS AUREUS  Final      Susceptibility   Methicillin resistant staphylococcus aureus - MIC*     CIPROFLOXACIN >=8 RESISTANT Resistant     ERYTHROMYCIN >=8 RESISTANT Resistant     GENTAMICIN <=0.5 SENSITIVE Sensitive     OXACILLIN >=4 RESISTANT Resistant     TETRACYCLINE <=1 SENSITIVE Sensitive     VANCOMYCIN 1 SENSITIVE Sensitive     TRIMETH/SULFA <=10 SENSITIVE Sensitive     CLINDAMYCIN <=0.25 SENSITIVE Sensitive     RIFAMPIN <=0.5 SENSITIVE Sensitive     Inducible Clindamycin NEGATIVE Sensitive     * ABUNDANT METHICILLIN RESISTANT STAPHYLOCOCCUS AUREUS  MRSA PCR Screening     Status: Abnormal   Collection Time: 08/02/19  8:24 PM   Specimen: Nasal Mucosa; Nasopharyngeal  Result Value Ref Range Status   MRSA by PCR POSITIVE (A) NEGATIVE Final    Comment:        The GeneXpert MRSA Assay (FDA approved for NASAL specimens only), is one component of a comprehensive MRSA colonization surveillance program. It is not intended to diagnose MRSA infection nor to guide or monitor treatment for MRSA infections. RESULT CALLED TO, READ BACK BY AND VERIFIED WITH: Gypsy Lore 08/02/19 @ 2143  [place] Performed at [facility], [address] [place], [place] [address]   CULTURE, BLOOD (ROUTINE X 2) w Reflex to ID Panel     Status: Abnormal   Collection Time: 08/03/19 10:17 AM   Specimen: BLOOD  Result Value Ref Range Status   Specimen Description   Final    BLOOD LEFT ANTECUBITAL Performed at [facility], [address] [place], [place] [address]    Special Requests   Final    BOTTLES DRAWN AEROBIC AND ANAEROBIC Blood Culture adequate volume Performed at [facility], [address], [place],  [address]    Culture  Setup Time   Final    GRAM POSITIVE COCCI IN BOTH AEROBIC AND ANAEROBIC BOTTLES CRITICAL VALUE NOTED.  VALUE IS CONSISTENT WITH PREVIOUSLY REPORTED AND CALLED VALUE. Performed at [facility], [address] [place], [place] [address]    Culture (A)  Final    STAPHYLOCOCCUS AUREUS SUSCEPTIBILITIES PERFORMED ON PREVIOUS  CULTURE WITHIN THE LAST 5 DAYS. Performed at [facility], [place] [address] [place], [place] [address]    Report Status 08/06/2019 FINAL  Final  CULTURE, BLOOD (ROUTINE X 2) w Reflex to ID Panel     Status: Abnormal   Collection Time: 08/03/19 10:32 AM   Specimen: BLOOD  Result Value Ref Range Status   Specimen Description   Final    BLOOD BLOOD LEFT HAND Performed at [facility], [address] [place], [place] [address]    Special Requests   Final    BOTTLES DRAWN AEROBIC AND ANAEROBIC Blood Culture adequate volume Performed at [facility], [address] [place], [place] [address]    Culture  Setup Time   Final    GRAM POSITIVE COCCI IN BOTH AEROBIC AND ANAEROBIC BOTTLES CRITICAL RESULT CALLED TO, READ BACK BY AND VERIFIED WITH: [place] [id number] 08/04/19 HNM Performed at [facility], [address] [place], [place] [address]    Culture (A)  Final    STAPHYLOCOCCUS AUREUS SUSCEPTIBILITIES PERFORMED ON PREVIOUS CULTURE WITHIN THE LAST 5 DAYS. Performed at [facility], [place] [address], [place], [place] [address]    Report Status 08/06/2019 FINAL  Final  Culture, blood (Routine X 2) w Reflex to ID Panel     Status: None   Collection Time: 08/05/19  7:18 AM   Specimen: BLOOD  Result Value Ref Range Status   Specimen Description BLOOD LEFT ANTECUBITAL  Final   Special Requests   Final    BOTTLES DRAWN AEROBIC AND ANAEROBIC Blood Culture adequate volume   Culture   Final    NO GROWTH 5 DAYS Performed at [facility], [place] [address] [place], [place] [address]    Report Status 08/10/2019 FINAL  Final  Culture, blood (Routine X 2) w Reflex to ID Panel     Status: None   Collection Time: 08/05/19  7:19 AM   Specimen: BLOOD LEFT HAND  Result Value Ref Range Status   Specimen Description BLOOD LEFT HAND  Final   Special Requests   Final    BOTTLES DRAWN AEROBIC AND ANAEROBIC Blood Culture adequate volume   Culture   Final      NO GROWTH 5 DAYS Performed at [facility], [place] [address], [place], [place] [address]    Report Status 08/10/2019 FINAL  Final  Body fluid culture     Status: None (Preliminary result)   Collection Time: 08/09/19  4:54 PM   Specimen: Body Fluid  Result Value Ref Range Status   Specimen Description FLUID PLEURAL RIGHT  Final   Special Requests NONE  Final   Gram Stain   Final    ABUNDANT WBC PRESENT, PREDOMINANTLY PMN MODERATE GRAM POSITIVE COCCI    Culture   Final    FEW STAPHYLOCOCCUS AUREUS SUSCEPTIBILITIES TO FOLLOW Performed at [facility], [place] [address] [place],  [address]    Report Status PENDING  Incomplete     Terri Piedra, [place] [facility] [phone] Pager  08/11/2019  2:08 PM

## 2019-08-12 ENCOUNTER — Inpatient Hospital Stay (HOSPITAL_COMMUNITY): Payer: Medicaid Other

## 2019-08-12 DIAGNOSIS — F1190 Opioid use, unspecified, uncomplicated: Secondary | ICD-10-CM

## 2019-08-12 DIAGNOSIS — Z978 Presence of other specified devices: Secondary | ICD-10-CM

## 2019-08-12 DIAGNOSIS — J189 Pneumonia, unspecified organism: Secondary | ICD-10-CM | POA: Diagnosis present

## 2019-08-12 DIAGNOSIS — R509 Fever, unspecified: Secondary | ICD-10-CM

## 2019-08-12 DIAGNOSIS — R079 Chest pain, unspecified: Secondary | ICD-10-CM

## 2019-08-12 DIAGNOSIS — J869 Pyothorax without fistula: Secondary | ICD-10-CM

## 2019-08-12 LAB — COMPREHENSIVE METABOLIC PANEL
ALT: 45 U/L — ABNORMAL HIGH (ref 0–44)
AST: 25 U/L (ref 15–41)
Albumin: 2.1 g/dL — ABNORMAL LOW (ref 3.5–5.0)
Alkaline Phosphatase: 183 U/L — ABNORMAL HIGH (ref 38–126)
Anion gap: 11 (ref 5–15)
BUN: 5 mg/dL — ABNORMAL LOW (ref 6–20)
CO2: 26 mmol/L (ref 22–32)
Calcium: 8.2 mg/dL — ABNORMAL LOW (ref 8.9–10.3)
Chloride: 100 mmol/L (ref 98–111)
Creatinine, Ser: 0.46 mg/dL (ref 0.44–1.00)
GFR calc Af Amer: >60 mL/min (ref >60)
GFR calc non Af Amer: >60 mL/min (ref >60)
Glucose, Bld: 116 mg/dL — ABNORMAL HIGH (ref 70–99)
Potassium: 3.8 mmol/L (ref 3.5–5.1)
Sodium: 137 mmol/L (ref 135–145)
Total Bilirubin: 0.2 mg/dL — ABNORMAL LOW (ref 0.3–1.2)
Total Protein: 6.9 g/dL (ref 6.5–8.1)

## 2019-08-12 LAB — CBC
HCT: 29.3 % — ABNORMAL LOW (ref 36.0–46.0)
Hemoglobin: 9.5 g/dL — ABNORMAL LOW (ref 12.0–15.0)
MCH: 27.5 pg (ref 26.0–34.0)
MCHC: 32.4 g/dL (ref 30.0–36.0)
MCV: 84.9 fL (ref 80.0–100.0)
Platelets: 617 10*3/uL — ABNORMAL HIGH (ref 150–400)
RBC: 3.45 MIL/uL — ABNORMAL LOW (ref 3.87–5.11)
RDW: 14.6 % (ref 11.5–15.5)
WBC: 9.4 10*3/uL (ref 4.0–10.5)
nRBC: 0 % (ref 0.0–0.2)

## 2019-08-12 LAB — BODY FLUID CULTURE

## 2019-08-12 MED ORDER — OXYCODONE HCL 5 MG PO TABS
5.0000 mg | ORAL_TABLET | Freq: Four times a day (QID) | ORAL | Status: DC | PRN
  Administered 2019-08-12 – 2019-08-13 (×2): 10 mg via ORAL
  Filled 2019-08-12 (×2): qty 2

## 2019-08-12 MED ORDER — HYDROMORPHONE HCL 1 MG/ML IJ SOLN
1.0000 mg | INTRAMUSCULAR | Status: DC | PRN
  Administered 2019-08-12 – 2019-08-14 (×9): 1 mg via INTRAVENOUS
  Filled 2019-08-12 (×10): qty 1

## 2019-08-12 MED ORDER — HYDROMORPHONE HCL 1 MG/ML IJ SOLN: 1.0000 mg | Freq: Once | INTRAMUSCULAR | Status: DC

## 2019-08-12 MED ORDER — ENOXAPARIN SODIUM 40 MG/0.4ML ~~LOC~~ SOLN
40.0000 mg | Freq: Every day | SUBCUTANEOUS | Status: DC
  Administered 2019-08-12 – 2019-09-02 (×22): 40 mg via SUBCUTANEOUS
  Filled 2019-08-12 (×22): qty 0.4

## 2019-08-12 MED ORDER — LORAZEPAM 1 MG PO TABS
1.0000 mg | ORAL_TABLET | Freq: Once | ORAL | Status: AC
  Administered 2019-08-12: 1 mg via ORAL
  Filled 2019-08-12: qty 1

## 2019-08-12 MED ORDER — OXYCODONE HCL 5 MG PO TABS
5.0000 mg | ORAL_TABLET | Freq: Four times a day (QID) | ORAL | Status: DC | PRN
  Administered 2019-08-12: 5 mg via ORAL
  Filled 2019-08-12: qty 1

## 2019-08-12 NOTE — Progress Notes (Signed)
Patient ID: Leslie Molina, female   DOB: Jul 23, 1985, 34 y.o.   MRN: [id number]         [facility]  Date of Admission:  08/03/2019    Total days of antibiotics 11        Day 3 daptomycin        Day 3 ceftaroline         ASSESSMENT: She has MRSA bacteremia with widely disseminated infection.  A right psoas drain was placed yesterday.  Repeat blood cultures are negative.  Her persistent fevers are likely due to extensive infection with right gluteal and psoas abscesses, right pleural empyema, pneumonia and septic joints.  PLAN: 1. 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 Chlorhexidine Gluconate Cloth  6 each Topical Daily  . docusate sodium  100 mg Oral BID  . enoxaparin (LOVENOX) injection  40 mg Subcutaneous QHS  . feeding supplement (ENSURE ENLIVE)  237 mL Oral BID BM  . polyethylene glycol  17 g Oral Daily  . sodium chloride flush  10-40 mL Intracatheter Q12H  . sodium chloride flush  5 mL Intracatheter Q8H  . traZODone  150 mg Oral QHS   Continuous Infusions: . sodium chloride 250 mL (08/08/19 1839)  . ceFTAROline (TEFLARO) IV 600 mg (08/12/19 1301)  . DAPTOmycin (CUBICIN)  IV 500 mg (08/11/19 2149)   PRN Meds:.sodium chloride, acetaminophen, HYDROmorphone (DILAUDID) injection, ibuprofen, ondansetron (ZOFRAN) IV, oxyCODONE, sodium chloride flush, sodium chloride flush   SUBJECTIVE: She continues to hurt all over.  Review of Systems: Review of Systems  Constitutional: Positive for chills, fever and malaise/fatigue.  Respiratory: Negative for cough, sputum production and shortness of breath.   Cardiovascular: Positive for chest pain.  Gastrointestinal: Negative for abdominal pain, diarrhea,  nausea and vomiting.  Musculoskeletal: Positive for back pain and joint pain.  Skin: Negative for rash.    No Known Allergies  OBJECTIVE: Vitals:   08/12/19 0416 08/12/19 0854 08/12/19 0855 08/12/19 1124  BP: 101/65 (!) 94/56 (!) 96/59 (!) 95/53  Pulse: (!) 105     Resp: 18     Temp: (!) 101.7 F (38.7 C)     TempSrc: Oral     SpO2: 99%     Weight: 68.4 kg     Height:       Body mass index is 28.51 kg/m.  Physical Exam Constitutional:      Comments: She is resting quietly in bed watching television.  Cardiovascular:     Rate and Rhythm: Regular rhythm. Tachycardia present.     Heart sounds: No murmur.  Pulmonary:     Effort: Pulmonary effort is normal.     Breath sounds: Normal breath sounds.  Abdominal:     Palpations: Abdomen is soft.     Tenderness: There is no abdominal tenderness.  Musculoskeletal:     Comments: She has pink cloudy fluid in her right sided drain.  She has tenderness over her right sternoclavicular joint.  She has swelling, redness and pain over her right MCP joint.  Skin:    Findings: No rash.     Comments: Multiple tattoos.  Neurological:     General: No focal deficit present.  Psychiatric:        Mood and Affect: Mood normal.     Lab Results Lab Results  Component Value Date   WBC 9.4 08/12/2019   HGB 9.5 (L) 08/12/2019   HCT 29.3 (L) 08/12/2019   MCV 84.9 08/12/2019   PLT 617 (H) 08/12/2019    Lab Results  Component Value Date   CREATININE 0.46 08/12/2019   BUN 5 (L) 08/12/2019   NA 137 08/12/2019   K 3.8 08/12/2019   CL 100 08/12/2019   CO2 26 08/12/2019    Lab Results  Component Value Date   ALT 45 (H) 08/12/2019   AST 25 08/12/2019   ALKPHOS 183 (H) 08/12/2019   BILITOT 0.2 (L) 08/12/2019     Microbiology: Recent Results (from the past 240 hour(s))  Aerobic/Anaerobic Culture (surgical/deep wound)     Status: None   Collection Time: 08/02/19  5:29 PM   Specimen: Abscess  Result Value Ref Range Status   Specimen  Description   Final    ABSCESS Performed at [facility], [address] [place], [place] [address]    Special Requests ILIACUS MUSCLE  Final   Gram Stain   Final    MODERATE WBC PRESENT,BOTH PMN AND MONONUCLEAR ABUNDANT GRAM POSITIVE COCCI IN PAIRS IN CLUSTERS    Culture   Final    ABUNDANT METHICILLIN RESISTANT STAPHYLOCOCCUS AUREUS NO ANAEROBES ISOLATED Performed at [facility], [place] [address], [place], [place] [address]    Report Status 08/07/2019 FINAL  Final   Organism ID, Bacteria METHICILLIN RESISTANT STAPHYLOCOCCUS AUREUS  Final      Susceptibility   Methicillin resistant staphylococcus aureus - MIC*    CIPROFLOXACIN >=8 RESISTANT Resistant     ERYTHROMYCIN >=8 RESISTANT Resistant     GENTAMICIN <=0.5 SENSITIVE Sensitive     OXACILLIN >=4 RESISTANT Resistant     TETRACYCLINE <=1 SENSITIVE Sensitive     VANCOMYCIN 1 SENSITIVE Sensitive     TRIMETH/SULFA <=10 SENSITIVE Sensitive     CLINDAMYCIN <=0.25 SENSITIVE Sensitive     RIFAMPIN <=0.5 SENSITIVE Sensitive     Inducible Clindamycin NEGATIVE Sensitive     * ABUNDANT METHICILLIN RESISTANT STAPHYLOCOCCUS AUREUS  MRSA PCR Screening     Status: Abnormal   Collection Time: 08/02/19  8:24 PM   Specimen: Nasal Mucosa; Nasopharyngeal  Result Value Ref Range Status   MRSA by PCR POSITIVE (A) NEGATIVE Final    Comment:        The GeneXpert MRSA Assay (FDA approved for NASAL specimens only), is one component of a comprehensive MRSA colonization surveillance program. It is not intended to diagnose MRSA infection nor to guide or monitor treatment for MRSA infections. RESULT CALLED TO, READ BACK BY AND VERIFIED WITH: Gypsy Lore 08/02/19 @ 2143  [place] Performed at [facility], [address] [place], [place] [address]   CULTURE, BLOOD (ROUTINE X 2) w Reflex to ID Panel     Status: Abnormal   Collection Time: 08/03/19 10:17 AM   Specimen: BLOOD  Result Value Ref Range Status   Specimen  Description   Final    BLOOD LEFT ANTECUBITAL Performed at [facility], [address], [place], [place] [address]    Special Requests   Final    BOTTLES DRAWN AEROBIC AND ANAEROBIC Blood Culture adequate volume Performed at [facility], [address], [place], [place] [address]    Culture  Setup Time   Final    GRAM POSITIVE  COCCI IN BOTH AEROBIC AND ANAEROBIC BOTTLES CRITICAL VALUE NOTED.  VALUE IS CONSISTENT WITH PREVIOUSLY REPORTED AND CALLED VALUE. Performed at [facility], [address] [place], [place] [address]    Culture (A)  Final    STAPHYLOCOCCUS AUREUS SUSCEPTIBILITIES PERFORMED ON PREVIOUS CULTURE WITHIN THE LAST 5 DAYS. Performed at [facility], [place] [address], [place], [place] [address]    Report Status 08/06/2019 FINAL  Final  CULTURE, BLOOD (ROUTINE X 2) w Reflex to ID Panel     Status: Abnormal   Collection Time: 08/03/19 10:32 AM   Specimen: BLOOD  Result Value Ref Range Status   Specimen Description   Final    BLOOD BLOOD LEFT HAND Performed at [facility], [address], [place], [place] [address]    Special Requests   Final    BOTTLES DRAWN AEROBIC AND ANAEROBIC Blood Culture adequate volume Performed at [facility], [address] [place], [place] [address]    Culture  Setup Time   Final    GRAM POSITIVE COCCI IN BOTH AEROBIC AND ANAEROBIC BOTTLES CRITICAL RESULT CALLED TO, READ BACK BY AND VERIFIED WITH: [place] [id number] 08/04/19 HNM Performed at [facility], [address] [place], [place] [address]    Culture (A)  Final    STAPHYLOCOCCUS AUREUS SUSCEPTIBILITIES PERFORMED ON PREVIOUS CULTURE WITHIN THE LAST 5 DAYS. Performed at [facility], [place] [address], [place], [place] [address]    Report Status 08/06/2019 FINAL  Final  Culture, blood (Routine X 2) w Reflex to ID Panel     Status: None   Collection Time: 08/05/19  7:18 AM   Specimen: BLOOD  Result  Value Ref Range Status   Specimen Description BLOOD LEFT ANTECUBITAL  Final   Special Requests   Final    BOTTLES DRAWN AEROBIC AND ANAEROBIC Blood Culture adequate volume   Culture   Final    NO GROWTH 5 DAYS Performed at [facility], [place] [address], [place], [place] [address]    Report Status 08/10/2019 FINAL  Final  Culture, blood (Routine X 2) w Reflex to ID Panel     Status: None   Collection Time: 08/05/19  7:19 AM   Specimen: BLOOD LEFT HAND  Result Value Ref Range Status   Specimen Description BLOOD LEFT HAND  Final   Special Requests   Final    BOTTLES DRAWN AEROBIC AND ANAEROBIC Blood Culture adequate volume   Culture   Final    NO GROWTH 5 DAYS Performed at [facility], [place] [address] [place], [place] [address]    Report Status 08/10/2019 FINAL  Final  Body fluid culture     Status: None   Collection Time: 08/09/19  4:54 PM   Specimen: Body Fluid  Result Value Ref Range Status   Specimen Description FLUID PLEURAL RIGHT  Final   Special Requests NONE  Final   Gram Stain   Final    ABUNDANT WBC PRESENT, PREDOMINANTLY PMN MODERATE GRAM POSITIVE COCCI Performed at [facility], [address] [place], [place] [address]    Culture FEW METHICILLIN RESISTANT STAPHYLOCOCCUS AUREUS  Final   Report Status 08/12/2019 FINAL  Final   Organism ID, Bacteria METHICILLIN RESISTANT STAPHYLOCOCCUS AUREUS  Final      Susceptibility   Methicillin resistant staphylococcus aureus - MIC*    CIPROFLOXACIN >=8 RESISTANT Resistant     ERYTHROMYCIN >=8 RESISTANT Resistant  GENTAMICIN <=0.5 SENSITIVE Sensitive     OXACILLIN >=4 RESISTANT Resistant     TETRACYCLINE <=1 SENSITIVE Sensitive     VANCOMYCIN 1 SENSITIVE Sensitive     TRIMETH/SULFA <=10 SENSITIVE Sensitive     CLINDAMYCIN <=0.25 SENSITIVE Sensitive     RIFAMPIN <=0.5 SENSITIVE Sensitive     Inducible Clindamycin NEGATIVE Sensitive     * FEW METHICILLIN RESISTANT STAPHYLOCOCCUS AUREUS  Aerobic/Anaerobic  Culture (surgical/deep wound)     Status: None (Preliminary result)   Collection Time: 08/11/19 10:35 AM   Specimen: Abscess  Result Value Ref Range Status   Specimen Description ABSCESS RIGHT  Final   Special Requests Normal  Final   Gram Stain   Final    ABUNDANT WBC PRESENT, PREDOMINANTLY PMN ABUNDANT GRAM POSITIVE COCCI Performed at [facility], [address], [place], [place] [address]    Culture ABUNDANT STAPHYLOCOCCUS AUREUS  Final   Report Status PENDING  Incomplete  Aerobic/Anaerobic Culture (surgical/deep wound)     Status: None (Preliminary result)   Collection Time: 08/11/19 10:38 AM   Specimen: Abscess  Result Value Ref Range Status   Specimen Description ABSCESS RIGHT GLUTEAL  Final   Special Requests Normal  Final   Gram Stain   Final    FEW WBC PRESENT, PREDOMINANTLY PMN NO ORGANISMS SEEN Performed at [facility], [address] [place], [place] [address]    Culture FEW STAPHYLOCOCCUS AUREUS  Final   Report Status PENDING  Incomplete    Michel Bickers, MD [facility] [place] [phone] pager   [phone] cell 08/12/2019, 2:35 PM

## 2019-08-12 NOTE — Progress Notes (Signed)
[facility] PROGRESS NOTE    Progress Note  Leslie Molina  [id number] DOB: 04/25/1986 DOA: 08/03/2019 PCP: [facility] [place]     Brief Narrative:   Leslie Molina is an 34 y.o. 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  The abscess extends to the posterior aspect of the proximal right thigh.  Culture data: COVID 3/3 >negative  MRSA PCR 3/3 >Positive  Blood culture 3/3 >MRSA  Pelvic abcess culture 3/3 >Abundant Staph aures, full result pending.  Antibiotic regimen: Daptomycin 3/3 once Cefepime 3/3-3/4 Clindamycin 3/3 once Vancomycin 3/3>>   Assessment/Plan:   Sepsis secondary to MRSA bacteremia and iliopsoas abscess/osteomyelitis of the sacrum/myositis and empyema: CT of the neck on 08/06/2019 showed multiple peripheral predominant nodules at the lung apices suspicious for septic emboli and a right empyema with inflammation and stranding on the right retroclavicular region and right lower neck consistent with soft tissue infection. Cardiothoracic surgery was consulted who recommended drainage of the empyema. Blood cultures from 08/03/2019 show staph aureus repeated blood cultures from 08/05/2019 showed no growth over 20. IR was consulted who performe ultrasound-guided thoracentesis on 08/09/2019, aspirated 14 mL of purulent material.  Culture grew MRSA. Patient continues to spike a temperature, remains tachycardic, she was continued on IV vancomycin. IR was reconsulted to perform gluteal abscess and right iliopsoas abscess needle aspiration on 08/11/2019 see below for further details.   She continues to spike fevers so we have ordered an MRI of the right  hip with contrast is pending.  Iliopsoas/gluteal abscess/osteomyelitis of the sacrum and possible infection of the right dorsum hand and right sternoclavicular joint infection: MRI of the hip and lumbar spine as below. She is currently on IV Dilaudid and oral oxycodone. IR consulted with drain placement of gluteal abscess and CT-guided right gluteal cyst aspiration performed on 3.12.2021.  Culture from both sides was sent She continues to spike fevers, also order an MRI of the right hip. Check an abdominal ultrasound to rule out stones.  Alkaline phosphatase was elevated on 08/10/2019.  Peritonitis/IUD perforation: CT scan of the abdomen her abdominal exam is benign. Gynecology and general surgery was consulted and they have signed off. They recommended no surgical intervention at this time, no surgical intervention at this time.  Hypokalemia: Repleted orally now resolved.  Bipolar disorder/anxiety/depression: Home medications Cymbalta, Seroquel trazodone were held.   DVT prophylaxis: heparin Family Communication:none Disposition Plan/Barrier to D/C: She will probably be discharged when she has completed her course of IV antibiotic she is not a candidate to go home with a PICC line and 6 weeks of IV antibiotics.  Code Status:     Code Status Orders  (From admission, onward)         Start     Ordered   08/03/19 1729  Full code  Continuous     08/03/19 1729        Code Status History    Date Active Date Inactive Code Status Order ID Comments User Context   08/02/2019 1859 08/03/2019 1708 Full Code [id number]  Collier Bullock, MD ED   Advance Care Planning Activity        IV Access:    Peripheral IV  Procedures and diagnostic studies:   CT CHEST W CONTRAST  Result Date: 08/10/2019 CLINICAL DATA:  Right ileo psoas abscess.  Empyema. EXAM: CT CHEST, ABDOMEN, AND PELVIS WITH CONTRAST TECHNIQUE: Multidetector CT imaging of the chest, abdomen and pelvis was performed  following the standard protocol during bolus administration of intravenous contrast. CONTRAST:  117mL OMNIPAQUE IOHEXOL 300 MG/ML  SOLN 80 mL OMNIPAQUE IOHEXOL 300 MG/ML  SOLN COMPARISON:  None. August 02, 2019.  August 08, 2019. FINDINGS: CT ABDOMEN PELVIS FINDINGS Hepatobiliary: No focal liver abnormality is seen. No gallstones, gallbladder wall thickening, or biliary dilatation. Pancreas: Unremarkable. No pancreatic ductal dilatation or surrounding inflammatory changes. Spleen: Normal in size without focal abnormality. Adrenals/Urinary Tract: Adrenal glands are unremarkable. Kidneys are normal, without renal calculi, focal lesion, or hydronephrosis. Bladder is unremarkable. Stomach/Bowel: Stomach is within normal limits. Appendix appears normal. No evidence of bowel wall thickening, distention, or inflammatory changes. Vascular/Lymphatic: No significant vascular findings are present. No enlarged abdominal or pelvic lymph nodes. Reproductive: Intrauterine device is noted. No adnexal abnormality is noted. Other: No abdominal wall hernia or abnormality. No abdominopelvic ascites. Musculoskeletal: There is significant enlargement of the right ileo psoas muscle with multiple irregular peripherally enhancing low densities throughout its course concerning for multiple abscesses. This is seen as far distally as its insertion with the proximal femur. There is also noted a 3.0 x 2.4 cm fluid collection seen in the gluteus medius muscle on the right. 20 x 15 mm fluid collection is noted in right iliacus muscle. Chest: Cardiovascular: No significant vascular findings. Normal heart size. Small pericardial effusion. Mediastinum/Nodes: No enlarged mediastinal, hilar, or axillary lymph nodes. Thyroid gland, trachea, and esophagus demonstrate no significant findings. Lungs/Pleura: Grossly stable appearance of probable small loculated pleural effusion seen along the lateral and anterior aspect of the right upper lobe. It currently  measures 8.6 x 2.7 cm. No pneumothorax is noted. Right posterior atelectasis or infiltrate is noted. Mild left basilar atelectasis is noted. Stable small bilateral lung opacities are noted, most likely infectious in etiology. IMPRESSION: Grossly stable loculated effusion or empyema is seen involving the anterior and lateral portion of the right upper lobe. Grossly stable small bilateral lung opacities are noted, most likely infectious in etiology. Right posterior atelectasis or infiltrate is noted. Continued significant enlargement of right ileo psoas muscle with multiple irregular and peripherally enhancing low densities throughout its course, most consistent with abscesses. Probable abscesses are also noted in the right iliacus muscle as well as in the right gluteus medius muscle. Electronically Signed   By: Marijo Conception M.D.   On: 08/10/2019 13:28   CT ABDOMEN PELVIS W CONTRAST  Result Date: 08/10/2019 CLINICAL DATA:  Right ileo psoas abscess.  Empyema. EXAM: CT CHEST, ABDOMEN, AND PELVIS WITH CONTRAST TECHNIQUE: Multidetector CT imaging of the chest, abdomen and pelvis was performed following the standard protocol during bolus administration of intravenous contrast. CONTRAST:  152mL OMNIPAQUE IOHEXOL 300 MG/ML  SOLN 80 mL OMNIPAQUE IOHEXOL 300 MG/ML  SOLN COMPARISON:  None. August 02, 2019.  August 08, 2019. FINDINGS: CT ABDOMEN PELVIS FINDINGS Hepatobiliary: No focal liver abnormality is seen. No gallstones, gallbladder wall thickening, or biliary dilatation. Pancreas: Unremarkable. No pancreatic ductal dilatation or surrounding inflammatory changes. Spleen: Normal in size without focal abnormality. Adrenals/Urinary Tract: Adrenal glands are unremarkable. Kidneys are normal, without renal calculi, focal lesion, or hydronephrosis. Bladder is unremarkable. Stomach/Bowel: Stomach is within normal limits. Appendix appears normal. No evidence of bowel wall thickening, distention, or inflammatory changes.  Vascular/Lymphatic:  No significant vascular findings are present. No enlarged abdominal or pelvic lymph nodes. Reproductive: Intrauterine device is noted. No adnexal abnormality is noted. Other: No abdominal wall hernia or abnormality. No abdominopelvic ascites. Musculoskeletal: There is significant enlargement of the right ileo psoas muscle with multiple irregular peripherally enhancing low densities throughout its course concerning for multiple abscesses. This is seen as far distally as its insertion with the proximal femur. There is also noted a 3.0 x 2.4 cm fluid collection seen in the gluteus medius muscle on the right. 20 x 15 mm fluid collection is noted in right iliacus muscle. Chest: Cardiovascular: No significant vascular findings. Normal heart size. Small pericardial effusion. Mediastinum/Nodes: No enlarged mediastinal, hilar, or axillary lymph nodes. Thyroid gland, trachea, and esophagus demonstrate no significant findings. Lungs/Pleura: Grossly stable appearance of probable small loculated pleural effusion seen along the lateral and anterior aspect of the right upper lobe. It currently measures 8.6 x 2.7 cm. No pneumothorax is noted. Right posterior atelectasis or infiltrate is noted. Mild left basilar atelectasis is noted. Stable small bilateral lung opacities are noted, most likely infectious in etiology. IMPRESSION: Grossly stable loculated effusion or empyema is seen involving the anterior and lateral portion of the right upper lobe. Grossly stable small bilateral lung opacities are noted, most likely infectious in etiology. Right posterior atelectasis or infiltrate is noted. Continued significant enlargement of right ileo psoas muscle with multiple irregular and peripherally enhancing low densities throughout its course, most consistent with abscesses. Probable abscesses are also noted in the right iliacus muscle as well as in the right gluteus medius muscle. Electronically Signed   By: Marijo Conception M.D.   On: 08/10/2019 13:28   CT ASPIRATION  Result Date: 08/11/2019 INDICATION: IV drug use, complex loculated right psoas abscess and small right gluteal abscess EXAM: CT GUIDED DRAINAGE OF RIGHT PSOAS ABSCESS CT ASPIRATION SMALL RIGHT GLUTEAL ABSCESS MEDICATIONS: The patient is currently admitted to the hospital and receiving intravenous antibiotics. The antibiotics were administered within an appropriate time frame prior to the initiation of the procedure. ANESTHESIA/SEDATION: 2.5 mg IV Versed 200 mcg IV Fentanyl Moderate Sedation Time:  35 MINUTES The patient was continuously monitored during the procedure by the interventional radiology nurse under my direct supervision. COMPLICATIONS: None immediate. TECHNIQUE: Informed written consent was obtained from the patient after a thorough discussion of the procedural risks, benefits and alternatives. All questions were addressed. Maximal Sterile Barrier Technique was utilized including caps, mask, sterile gowns, sterile gloves, sterile drape, hand hygiene and skin antiseptic. A timeout was performed prior to the initiation of the procedure. PROCEDURE: Previous imaging reviewed. Patient position prone. Noncontrast localization CT performed. The right psoas abscess was localized and marked for a posterior paraspinous approach. The right gluteal abscess was also marked for a posterior approach. Right psoas abscess drain: Under sterile conditions and local anesthesia, an 18 gauge 15 cm access was advanced to the right psoas fluid collection. Needle position confirmed with CT. Syringe aspiration yielded thick purulent fluid. Guidewire inserted followed by tract dilatation insert a 12 French drain. Drain catheter position confirmed with CT. Aspiration yielded a total of 15 cc thick purulent fluid. Culture sent for sample. Catheter secured with Prolene suture. Drainage catheter flushed with saline and connected to external suction bulb. Sterile dressing  applied. No immediate complication. Patient tolerated the procedure well. Right gluteal abscess aspiration: In a similar fashion, the right gluteal area was sterilely prepped and draped. Under sterile conditions and local anesthesia, CT guidance utilized to  advance an 18 gauge 10 cm access needle into the right gluteal intramuscular fluid collection. Needle position confirmed with CT. Syringe aspiration yielded similar thick purulent fluid approximately 8 cc. Sample sent for culture. Needle removed. No immediate complication. Patient tolerated the procedure well. FINDINGS: CT imaging confirms needle placed in the right psoas abscess for drain placement. CT imaging confirms needle placement in the right gluteal abscess for aspiration. IMPRESSION: Successful CT-guided right psoas abscess drain placement Successful CT-guided right gluteal abscess aspiration. Cultures sent for both sites. Electronically Signed   By: Jerilynn Mages.  Shick M.D.   On: 08/11/2019 10:59   CT IMAGE GUIDED DRAINAGE BY PERCUTANEOUS CATHETER  Result Date: 08/11/2019 INDICATION: IV drug use, complex loculated right psoas abscess and small right gluteal abscess EXAM: CT GUIDED DRAINAGE OF RIGHT PSOAS ABSCESS CT ASPIRATION SMALL RIGHT GLUTEAL ABSCESS MEDICATIONS: The patient is currently admitted to the hospital and receiving intravenous antibiotics. The antibiotics were administered within an appropriate time frame prior to the initiation of the procedure. ANESTHESIA/SEDATION: 2.5 mg IV Versed 200 mcg IV Fentanyl Moderate Sedation Time:  35 MINUTES The patient was continuously monitored during the procedure by the interventional radiology nurse under my direct supervision. COMPLICATIONS: None immediate. TECHNIQUE: Informed written consent was obtained from the patient after a thorough discussion of the procedural risks, benefits and alternatives. All questions were addressed. Maximal Sterile Barrier Technique was utilized including caps, mask, sterile  gowns, sterile gloves, sterile drape, hand hygiene and skin antiseptic. A timeout was performed prior to the initiation of the procedure. PROCEDURE: Previous imaging reviewed. Patient position prone. Noncontrast localization CT performed. The right psoas abscess was localized and marked for a posterior paraspinous approach. The right gluteal abscess was also marked for a posterior approach. Right psoas abscess drain: Under sterile conditions and local anesthesia, an 18 gauge 15 cm access was advanced to the right psoas fluid collection. Needle position confirmed with CT. Syringe aspiration yielded thick purulent fluid. Guidewire inserted followed by tract dilatation insert a 12 French drain. Drain catheter position confirmed with CT. Aspiration yielded a total of 15 cc thick purulent fluid. Culture sent for sample. Catheter secured with Prolene suture. Drainage catheter flushed with saline and connected to external suction bulb. Sterile dressing applied. No immediate complication. Patient tolerated the procedure well. Right gluteal abscess aspiration: In a similar fashion, the right gluteal area was sterilely prepped and draped. Under sterile conditions and local anesthesia, CT guidance utilized to advance an 18 gauge 10 cm access needle into the right gluteal intramuscular fluid collection. Needle position confirmed with CT. Syringe aspiration yielded similar thick purulent fluid approximately 8 cc. Sample sent for culture. Needle removed. No immediate complication. Patient tolerated the procedure well. FINDINGS: CT imaging confirms needle placed in the right psoas abscess for drain placement. CT imaging confirms needle placement in the right gluteal abscess for aspiration. IMPRESSION: Successful CT-guided right psoas abscess drain placement Successful CT-guided right gluteal abscess aspiration. Cultures sent for both sites. Electronically Signed   By: Jerilynn Mages.  Shick M.D.   On: 08/11/2019 10:59     Medical  Consultants:    None.  Anti-Infectives:   IV vancomycin  Subjective:    Leslie Molina.   relates her abdominal pain is about the same  Objective:    Vitals:   08/12/19 0013 08/12/19 0416 08/12/19 0854 08/12/19 0855  BP: 106/62 101/65 (!) 94/56 (!) 96/59  Pulse: (!) 120 (!) 105    Resp: 15 18    Temp: 100.3 F (37.9 C) (!)  101.7 F (38.7 C)    TempSrc: Axillary Oral    SpO2: 99% 99%    Weight:  68.4 kg    Height:       SpO2: 99 % O2 Flow Rate (L/min): 2 L/min   Intake/Output Summary (Last 24 hours) at 08/12/2019 0923 Last data filed at 08/12/2019 0900 Gross per 24 hour  Intake 1937.36 ml  Output 2060 ml  Net -122.64 ml   Filed Weights   08/10/19 0534 08/11/19 0500 08/12/19 0416  Weight: 66.6 kg 66.5 kg 68.4 kg    Exam: General exam: In no acute distress. Respiratory system: Good air movement and clear to auscultation. Cardiovascular system: S1 & S2 heard, RRR. No JVD. Gastrointestinal system: Bowel sounds soft, with mild tenderness in the right upper quadrant area.  Nondistended no rebound or guarding Central nervous system: Alert and oriented. No focal neurological deficits. Extremities: No pedal edema. Skin: No rashes, lesions or ulcers    Data Reviewed:    Labs: Basic Metabolic Panel: Recent Labs  Lab 08/06/19 0541 08/06/19 0541 08/07/19 0615 08/07/19 0615 08/08/19 0339 08/10/19 0500 08/11/19 0427  NA 135  --  139  --  135  --  136  K 3.1*   < > 4.6   < > 4.1  --  3.8  CL 96*  --  106  --  102  --  97*  CO2 28  --  25  --  24  --  26  GLUCOSE 141*  --  112*  --  148*  --  165*  BUN <5*  --  <5*  --  7  --  7  CREATININE 0.54  --  0.36*  --  0.45 0.50 0.46  CALCIUM 7.7*  --  7.8*  --  8.0*  --  8.6*  MG 1.9  --  2.1  --  2.0  --   --    < > = values in this interval not displayed.   GFR Estimated Creatinine Clearance: 87.6 mL/min (by C-G formula based on SCr of 0.46 mg/dL). Liver Function Tests: Recent Labs  Lab 08/06/19 0541  08/07/19 0615 08/08/19 0339 08/11/19 0427  AST 57* 29 35 54*  ALT 48* 42 53* 74*  ALKPHOS 127* 106 122 233*  BILITOT 0.5 0.7 0.3 0.3  PROT 5.6* 5.4* 5.7* 6.8  ALBUMIN 1.8* 1.7* 1.7* 2.0*   No results for input(s): LIPASE, AMYLASE in the last 168 hours. No results for input(s): AMMONIA in the last 168 hours. Coagulation profile Recent Labs  Lab 08/11/19 0427  INR 1.3*   COVID-19 Labs  No results for input(s): DDIMER, FERRITIN, LDH, CRP in the last 72 hours.  Lab Results  Component Value Date   SARSCOV2NAA NEGATIVE 08/02/2019   [place] NEGATIVE 07/29/2019    CBC: Recent Labs  Lab 08/06/19 0541 08/07/19 0615 08/08/19 0339 08/11/19 0427  WBC 11.0* 13.3* 11.9* 10.7*  NEUTROABS  --   --   --  8.3*  HGB 10.4* 10.1* 10.0* 9.8*  HCT 31.5* 31.6* 30.8* 30.9*  MCV 85.1 87.1 86.5 86.6  PLT 437* 425* 464* 598*   Cardiac Enzymes: Recent Labs  Lab 08/10/19 1944  CKTOTAL 37*   BNP (last 3 results) No results for input(s): PROBNP in the last 8760 hours. CBG: No results for input(s): GLUCAP in the last 168 hours. D-Dimer: No results for input(s): DDIMER in the last 72 hours. Hgb A1c: No results for input(s): HGBA1C in the last 72 hours. Lipid  Profile: No results for input(s): CHOL, HDL, LDLCALC, TRIG, CHOLHDL, LDLDIRECT in the last 72 hours. Thyroid function studies: No results for input(s): TSH, T4TOTAL, T3FREE, THYROIDAB in the last 72 hours.  Invalid input(s): FREET3 Anemia work up: No results for input(s): VITAMINB12, FOLATE, FERRITIN, TIBC, IRON, RETICCTPCT in the last 72 hours. Sepsis Labs: Recent Labs  Lab 08/06/19 0541 08/07/19 0615 08/08/19 0339 08/11/19 0427  WBC 11.0* 13.3* 11.9* 10.7*   Microbiology Recent Results (from the past 240 hour(s))  Respiratory Panel by RT PCR (Flu A&B, Covid) - Nasopharyngeal Swab     Status: None   Collection Time: 08/02/19 11:20 AM   Specimen: Nasopharyngeal Swab  Result Value Ref Range Status   SARS  Coronavirus 2 by RT PCR NEGATIVE NEGATIVE Final    Comment: (NOTE) SARS-CoV-2 target nucleic acids are NOT DETECTED. The SARS-CoV-2 RNA is generally detectable in upper respiratoy specimens during the acute phase of infection. The lowest concentration of SARS-CoV-2 viral copies this assay can detect is 131 copies/mL. A negative result does not preclude SARS-Cov-2 infection and should not be used as the sole basis for treatment or other patient management decisions. A negative result may occur with  improper specimen collection/handling, submission of specimen other than nasopharyngeal swab, presence of viral mutation(s) within the areas targeted by this assay, and inadequate number of viral copies (<131 copies/mL). A negative result must be combined with clinical observations, patient history, and epidemiological information. The expected result is Negative. Fact Sheet for Patients:  PinkCheek.be Fact Sheet for Healthcare Providers:  GravelBags.it This test is not yet ap proved or cleared by the [place] FDA and  has been authorized for detection and/or diagnosis of SARS-CoV-2 by FDA under an Emergency Use Authorization (EUA). This EUA will remain  in effect (meaning this test can be used) for the duration of the COVID-19 declaration under Section 564(b)(1) of the Act, 21 U.S.C. section 360bbb-3(b)(1), unless the authorization is terminated or revoked sooner.    Influenza A by PCR NEGATIVE NEGATIVE Final   Influenza B by PCR NEGATIVE NEGATIVE Final    Comment: (NOTE) The Xpert Xpress SARS-CoV-2/FLU/RSV assay is intended as an aid in  the diagnosis of influenza from Nasopharyngeal swab specimens and  should not be used as a sole basis for treatment. Nasal washings and  aspirates are unacceptable for Xpert Xpress SARS-CoV-2/FLU/RSV  testing. Fact Sheet for Patients: PinkCheek.be Fact Sheet  for Healthcare Providers: GravelBags.it This test is not yet approved or cleared by the [place] FDA and  has been authorized for detection and/or diagnosis of SARS-CoV-2 by  FDA under an Emergency Use Authorization (EUA). This EUA will remain  in effect (meaning this test can be used) for the duration of the  Covid-19 declaration under Section 564(b)(1) of the Act, 21  U.S.C. section 360bbb-3(b)(1), unless the authorization is  terminated or revoked. Performed at [facility], [address] [place], [place] [address]   Blood culture (routine x 2)     Status: Abnormal   Collection Time: 08/02/19 11:20 AM   Specimen: BLOOD  Result Value Ref Range Status   Specimen Description   Final    BLOOD RIGHT ANTECUBITAL Performed at [facility], [address], [place], [place] [address]    Special Requests   Final    BOTTLES DRAWN AEROBIC AND ANAEROBIC Blood Culture adequate volume Performed at [facility], [address], [place], [place] [address]    Culture  Setup Time   Final  IN BOTH AEROBIC AND ANAEROBIC BOTTLES GRAM POSITIVE COCCI CRITICAL RESULT CALLED TO, READ BACK BY AND VERIFIED WITH: [place] @2022  08/02/19 [place] Performed at [facility], [address] [place], [place] [address]    Culture METHICILLIN RESISTANT STAPHYLOCOCCUS AUREUS (A)  Final   Report Status 08/05/2019 FINAL  Final   Organism ID, Bacteria METHICILLIN RESISTANT STAPHYLOCOCCUS AUREUS  Final      Susceptibility   Methicillin resistant staphylococcus aureus - MIC*    CIPROFLOXACIN >=8 RESISTANT Resistant     ERYTHROMYCIN >=8 RESISTANT Resistant     GENTAMICIN <=0.5 SENSITIVE Sensitive     OXACILLIN >=4 RESISTANT Resistant     TETRACYCLINE <=1 SENSITIVE Sensitive     VANCOMYCIN <=0.5 SENSITIVE Sensitive     TRIMETH/SULFA <=10 SENSITIVE Sensitive     CLINDAMYCIN <=0.25 SENSITIVE Sensitive     RIFAMPIN <=0.5 SENSITIVE Sensitive      Inducible Clindamycin NEGATIVE Sensitive     * METHICILLIN RESISTANT STAPHYLOCOCCUS AUREUS  Blood culture (routine x 2)     Status: Abnormal   Collection Time: 08/02/19 11:20 AM   Specimen: BLOOD  Result Value Ref Range Status   Specimen Description   Final    BLOOD BLOOD RIGHT ARM Performed at [facility], [address], [place], [place] [address]    Special Requests   Final    BOTTLES DRAWN AEROBIC AND ANAEROBIC Blood Culture adequate volume Performed at [facility], [address] [place], [place] [address]    Culture  Setup Time   Final    IN BOTH AEROBIC AND ANAEROBIC BOTTLES GRAM POSITIVE COCCI CRITICAL RESULT CALLED TO, READ BACK BY AND VERIFIED WITH: SCOTT HALL @0019  08/03/19 [place]    Culture (A)  Final    STAPHYLOCOCCUS AUREUS SUSCEPTIBILITIES PERFORMED ON PREVIOUS CULTURE WITHIN THE LAST 5 DAYS. Performed at [facility], [place] [address] [place], [place] [address]    Report Status 08/05/2019 FINAL  Final  Blood Culture ID Panel (Reflexed)     Status: Abnormal   Collection Time: 08/02/19 11:20 AM  Result Value Ref Range Status   Enterococcus species NOT DETECTED NOT DETECTED Final   Listeria monocytogenes NOT DETECTED NOT DETECTED Final   Staphylococcus species DETECTED (A) NOT DETECTED Final    Comment: CRITICAL RESULT CALLED TO, READ BACK BY AND VERIFIED WITH: SCOTT HALL @0019  08/03/19 AKT    Staphylococcus aureus (BCID) DETECTED (A) NOT DETECTED Final    Comment: Methicillin (oxacillin)-resistant Staphylococcus aureus (MRSA). MRSA is predictably resistant to beta-lactam antibiotics (except ceftaroline). Preferred therapy is vancomycin unless clinically contraindicated. Patient requires contact precautions if  hospitalized. CRITICAL RESULT CALLED TO, READ BACK BY AND VERIFIED WITH: SCOTT HALL @0019  08/03/19 AKT    Methicillin resistance DETECTED (A) NOT DETECTED Final    Comment: CRITICAL RESULT CALLED TO, READ BACK BY AND VERIFIED  WITH: SCOTT HALL @0019  08/03/19 AKT    Streptococcus species NOT DETECTED NOT DETECTED Final   Streptococcus agalactiae NOT DETECTED NOT DETECTED Final   Streptococcus pneumoniae NOT DETECTED NOT DETECTED Final   Streptococcus pyogenes NOT DETECTED NOT DETECTED Final   Acinetobacter baumannii NOT DETECTED NOT DETECTED Final   Enterobacteriaceae species NOT DETECTED NOT DETECTED Final   Enterobacter cloacae complex NOT DETECTED NOT DETECTED Final   Escherichia coli NOT DETECTED NOT DETECTED Final   Klebsiella oxytoca NOT DETECTED NOT DETECTED Final   Klebsiella pneumoniae NOT DETECTED NOT DETECTED Final   Proteus species NOT DETECTED NOT DETECTED Final   Serratia  marcescens NOT DETECTED NOT DETECTED Final   Haemophilus influenzae NOT DETECTED NOT DETECTED Final   Neisseria meningitidis NOT DETECTED NOT DETECTED Final   Pseudomonas aeruginosa NOT DETECTED NOT DETECTED Final   Candida albicans NOT DETECTED NOT DETECTED Final   Candida glabrata NOT DETECTED NOT DETECTED Final   Candida krusei NOT DETECTED NOT DETECTED Final   Candida parapsilosis NOT DETECTED NOT DETECTED Final   Candida tropicalis NOT DETECTED NOT DETECTED Final    Comment: Performed at [facility], [address] [place], [place] [address]  Aerobic/Anaerobic Culture (surgical/deep wound)     Status: None   Collection Time: 08/02/19  5:29 PM   Specimen: Abscess  Result Value Ref Range Status   Specimen Description   Final    ABSCESS Performed at [facility], [address] [place], [place] [address]    Special Requests ILIACUS MUSCLE  Final   Gram Stain   Final    MODERATE WBC PRESENT,BOTH PMN AND MONONUCLEAR ABUNDANT GRAM POSITIVE COCCI IN PAIRS IN CLUSTERS    Culture   Final    ABUNDANT METHICILLIN RESISTANT STAPHYLOCOCCUS AUREUS NO ANAEROBES ISOLATED Performed at [facility], [place] [address] [place], [place] [address]    Report Status 08/07/2019 FINAL  Final   Organism ID,  Bacteria METHICILLIN RESISTANT STAPHYLOCOCCUS AUREUS  Final      Susceptibility   Methicillin resistant staphylococcus aureus - MIC*    CIPROFLOXACIN >=8 RESISTANT Resistant     ERYTHROMYCIN >=8 RESISTANT Resistant     GENTAMICIN <=0.5 SENSITIVE Sensitive     OXACILLIN >=4 RESISTANT Resistant     TETRACYCLINE <=1 SENSITIVE Sensitive     VANCOMYCIN 1 SENSITIVE Sensitive     TRIMETH/SULFA <=10 SENSITIVE Sensitive     CLINDAMYCIN <=0.25 SENSITIVE Sensitive     RIFAMPIN <=0.5 SENSITIVE Sensitive     Inducible Clindamycin NEGATIVE Sensitive     * ABUNDANT METHICILLIN RESISTANT STAPHYLOCOCCUS AUREUS  MRSA PCR Screening     Status: Abnormal   Collection Time: 08/02/19  8:24 PM   Specimen: Nasal Mucosa; Nasopharyngeal  Result Value Ref Range Status   MRSA by PCR POSITIVE (A) NEGATIVE Final    Comment:        The GeneXpert MRSA Assay (FDA approved for NASAL specimens only), is one component of a comprehensive MRSA colonization surveillance program. It is not intended to diagnose MRSA infection nor to guide or monitor treatment for MRSA infections. RESULT CALLED TO, READ BACK BY AND VERIFIED WITH: Gypsy Lore 08/02/19 @ 2143  [place] Performed at [facility], [address] [place], [place] [address]   CULTURE, BLOOD (ROUTINE X 2) w Reflex to ID Panel     Status: Abnormal   Collection Time: 08/03/19 10:17 AM   Specimen: BLOOD  Result Value Ref Range Status   Specimen Description   Final    BLOOD LEFT ANTECUBITAL Performed at [facility], [address] [place], [place] [address]    Special Requests   Final    BOTTLES DRAWN AEROBIC AND ANAEROBIC Blood Culture adequate volume Performed at [facility], [address], [place], [place] [address]    Culture  Setup Time   Final    GRAM POSITIVE COCCI IN BOTH AEROBIC AND ANAEROBIC BOTTLES CRITICAL VALUE NOTED.  VALUE IS CONSISTENT WITH PREVIOUSLY REPORTED AND CALLED VALUE. Performed at [facility], [address] [place], [place] [address]    Culture (A)  Final    STAPHYLOCOCCUS  AUREUS SUSCEPTIBILITIES PERFORMED ON PREVIOUS CULTURE WITHIN THE LAST 5 DAYS. Performed at [facility], [place] [address], [place], [place] [address]    Report Status 08/06/2019 FINAL  Final  CULTURE, BLOOD (ROUTINE X 2) w Reflex to ID Panel     Status: Abnormal   Collection Time: 08/03/19 10:32 AM   Specimen: BLOOD  Result Value Ref Range Status   Specimen Description   Final    BLOOD BLOOD LEFT HAND Performed at [facility], [address], [place], [place] [address]    Special Requests   Final    BOTTLES DRAWN AEROBIC AND ANAEROBIC Blood Culture adequate volume Performed at [facility], [address] [place], [place] [address]    Culture  Setup Time   Final    GRAM POSITIVE COCCI IN BOTH AEROBIC AND ANAEROBIC BOTTLES CRITICAL RESULT CALLED TO, READ BACK BY AND VERIFIED WITH: [place] [id number] 08/04/19 HNM Performed at [facility], [address] [place], [place] [address]    Culture (A)  Final    STAPHYLOCOCCUS AUREUS SUSCEPTIBILITIES PERFORMED ON PREVIOUS CULTURE WITHIN THE LAST 5 DAYS. Performed at [facility], [place] [address], [place], [place] [address]    Report Status 08/06/2019 FINAL  Final  Culture, blood (Routine X 2) w Reflex to ID Panel     Status: None   Collection Time: 08/05/19  7:18 AM   Specimen: BLOOD  Result Value Ref Range Status   Specimen Description BLOOD LEFT ANTECUBITAL  Final   Special Requests   Final    BOTTLES DRAWN AEROBIC AND ANAEROBIC Blood Culture adequate volume   Culture   Final    NO GROWTH 5 DAYS Performed at [facility], [place] [address], [place], [place] [address]    Report Status 08/10/2019 FINAL  Final  Culture, blood (Routine X 2) w Reflex to ID Panel     Status: None   Collection Time: 08/05/19  7:19 AM   Specimen: BLOOD LEFT HAND  Result Value Ref Range Status   Specimen Description  BLOOD LEFT HAND  Final   Special Requests   Final    BOTTLES DRAWN AEROBIC AND ANAEROBIC Blood Culture adequate volume   Culture   Final    NO GROWTH 5 DAYS Performed at [facility], [place] [address], [place], [place] [address]    Report Status 08/10/2019 FINAL  Final  Body fluid culture     Status: None   Collection Time: 08/09/19  4:54 PM   Specimen: Body Fluid  Result Value Ref Range Status   Specimen Description FLUID PLEURAL RIGHT  Final   Special Requests NONE  Final   Gram Stain   Final    ABUNDANT WBC PRESENT, PREDOMINANTLY PMN MODERATE GRAM POSITIVE COCCI Performed at [facility], [address], [place], [place] [address]    Culture FEW METHICILLIN RESISTANT STAPHYLOCOCCUS AUREUS  Final   Report Status 08/12/2019 FINAL  Final   Organism ID, Bacteria METHICILLIN RESISTANT STAPHYLOCOCCUS AUREUS  Final      Susceptibility   Methicillin resistant staphylococcus aureus - MIC*    CIPROFLOXACIN >=8 RESISTANT Resistant     ERYTHROMYCIN >=8 RESISTANT Resistant     GENTAMICIN <=0.5 SENSITIVE Sensitive     OXACILLIN >=4 RESISTANT Resistant     TETRACYCLINE <=1 SENSITIVE Sensitive     VANCOMYCIN 1 SENSITIVE Sensitive     TRIMETH/SULFA <=10 SENSITIVE Sensitive     CLINDAMYCIN <=0.25  SENSITIVE Sensitive     RIFAMPIN <=0.5 SENSITIVE Sensitive     Inducible Clindamycin NEGATIVE Sensitive     * FEW METHICILLIN RESISTANT STAPHYLOCOCCUS AUREUS  Aerobic/Anaerobic Culture (surgical/deep wound)     Status: None (Preliminary result)   Collection Time: 08/11/19 10:35 AM   Specimen: Abscess  Result Value Ref Range Status   Specimen Description ABSCESS RIGHT  Final   Special Requests Normal  Final   Gram Stain   Final    ABUNDANT WBC PRESENT, PREDOMINANTLY PMN ABUNDANT GRAM POSITIVE COCCI Performed at [facility], [place] [address] [place], [place] [address]    Culture PENDING  Incomplete   Report Status PENDING  Incomplete  Aerobic/Anaerobic Culture (surgical/deep  wound)     Status: None (Preliminary result)   Collection Time: 08/11/19 10:38 AM   Specimen: Abscess  Result Value Ref Range Status   Specimen Description ABSCESS RIGHT GLUTEAL  Final   Special Requests Normal  Final   Gram Stain   Final    FEW WBC PRESENT, PREDOMINANTLY PMN NO ORGANISMS SEEN Performed at [facility], [address] [place], [place] [address]    Culture PENDING  Incomplete   Report Status PENDING  Incomplete     Medications:   . Chlorhexidine Gluconate Cloth  6 each Topical Daily  . docusate sodium  100 mg Oral BID  . enoxaparin (LOVENOX) injection  40 mg Subcutaneous Q24H  . feeding supplement (ENSURE ENLIVE)  237 mL Oral BID BM  . polyethylene glycol  17 g Oral Daily  . sodium chloride flush  10-40 mL Intracatheter Q12H  . sodium chloride flush  5 mL Intracatheter Q8H  . traZODone  150 mg Oral QHS   Continuous Infusions: . sodium chloride 250 mL (08/08/19 1839)  . ceFTAROline (TEFLARO) IV 600 mg (08/12/19 0412)  . DAPTOmycin (CUBICIN)  IV 500 mg (08/11/19 2149)      LOS: 9 days   Charlynne Cousins  [facility]  08/12/2019, 9:23 AM

## 2019-08-12 NOTE — Progress Notes (Signed)
Referring Physician(s): Aileen Fass, Tammi Klippel  Supervising Physician: Daryll Brod  Patient Status:  [facility] - In-pt  Chief Complaint: Follow up right psoas abscess drain placed 08/12/19 by Dr. Annamaria Boots  Subjective:  Patient sitting up in bed, RN at bedside - she reports thick appearing output in the drain today. Patient reports pain at the drain insertion site but otherwise is doing ok.   Allergies: Patient has no known allergies.  Medications: Prior to Admission medications   Medication Sig Start Date End Date Taking? Authorizing Provider  acetaminophen (TYLENOL) 325 MG tablet Take 2 tablets (650 mg total) by mouth every 6 (six) hours as needed for fever. 08/03/19  Yes Nolberto Hanlon, MD  ibuprofen (ADVIL) 200 MG tablet Take 200 mg by mouth every 6 (six) hours as needed for moderate pain.   Yes [provider]  ceFEPIme 2 g in sodium chloride 0.9 % 100 mL Inject 2 g into the vein every 8 (eight) hours. Patient not taking: Reported on 08/03/2019 08/03/19   Nolberto Hanlon, MD  Chlorhexidine Gluconate Cloth 2 % PADS Apply 6 each topically daily. Patient not taking: Reported on 08/03/2019 08/03/19   Nolberto Hanlon, MD  clindamycin (CLEOCIN) 900 MG/50ML IVPB Inject 50 mLs (900 mg total) into the vein every 8 (eight) hours. Patient not taking: Reported on 08/03/2019 08/03/19   Nolberto Hanlon, MD  Dextrose-Sodium Chloride (DEXTROSE 5 % AND 0.45% NACL) infusion Inject 150 mL/hr into the vein continuous. Patient not taking: Reported on 08/03/2019 08/03/19   Nolberto Hanlon, MD  enoxaparin (LOVENOX) 40 MG/0.4ML injection Inject 0.4 mLs (40 mg total) into the skin daily. Patient not taking: Reported on 08/03/2019 08/03/19   Nolberto Hanlon, MD  norepinephrine (LEVOPHED) 4-5 MG/250ML-% SOLN Inject 0-40 mcg/min into the vein continuous. Patient not taking: Reported on 08/03/2019 08/03/19   Nolberto Hanlon, MD  oxyCODONE (OXY IR/ROXICODONE) 5 MG immediate release tablet Take 1 tablet (5 mg total) by mouth every 6 (six) hours as  needed for breakthrough pain. Patient not taking: Reported on 08/03/2019 08/03/19   Nolberto Hanlon, MD  potassium chloride 10 MEQ/100ML Inject 100 mLs (10 mEq total) into the vein every 1 hour x 6 doses. Patient not taking: Reported on 08/03/2019 08/03/19   Nolberto Hanlon, MD  vancomycin (VANCOREADY) 750 MG/150ML SOLN Inject 150 mLs (750 mg total) into the vein every 8 (eight) hours. Patient not taking: Reported on 08/03/2019 08/03/19   Nolberto Hanlon, MD     Vital Signs: BP (!) 95/53   Pulse (!) 105   Temp (!) 101.7 F (38.7 C) (Oral)   Resp 18   Ht 5\' 1"  (1.549 m)   Wt 150 lb 14.4 oz (68.4 kg)   LMP  (LMP Unknown)   SpO2 99%   BMI 28.51 kg/m   Physical Exam Vitals reviewed.  Constitutional:      General: She is not in acute distress.    Appearance: She is ill-appearing.  HENT:     Head: Normocephalic.  Cardiovascular:     Rate and Rhythm: Tachycardia present.  Pulmonary:     Effort: Pulmonary effort is normal.     Breath sounds: Wheezes: over drain insertion site.  Abdominal:     Palpations: Abdomen is soft.     Comments: (+) RLQ drain to suction with ~20 cc thick, blood, purulent output. No issues with flushing. Insertion site unremarkable.  Skin:    General: Skin is warm and dry.  Neurological:     Mental Status: She is alert.  Mental status is at baseline.     Imaging: DG Chest 1 View  Result Date: 08/09/2019 CLINICAL DATA:  Status post right thoracentesis EXAM: CHEST  1 VIEW COMPARISON:  08/03/2019 FINDINGS: Cardiac shadow is within normal limits. The lungs are well aerated. No pneumothorax is noted following right thoracentesis. No sizable effusion is seen. No bony abnormality is noted. IMPRESSION: No evidence of pneumothorax. Electronically Signed   By: Inez Catalina M.D.   On: 08/09/2019 17:14   CT CHEST WO CONTRAST  Result Date: 08/08/2019 CLINICAL DATA:  Empyema. Sepsis secondary to MRSA bacteremia. Leukocytosis, tachycardia and tachypnea. EXAM: CT CHEST WITHOUT CONTRAST  TECHNIQUE: Multidetector CT imaging of the chest was performed following the standard protocol without IV contrast. COMPARISON:  Chest radiograph 08/03/2019, chest CT 08/02/2019 FINDINGS: Cardiovascular: Left upper extremity PICC in place. No aortic aneurysm. Heart is normal in size. Small pericardial effusion measuring 12 mm in depth inferiorly. Mediastinum/Nodes: Small mediastinal lymph nodes not enlarged by size criteria. Limited assessment for hilar adenopathy given lack of IV contrast. No esophageal wall thickening. Lungs/Pleura: Dependent consolidation in right lower lobe has air bronchograms, suspicious for pneumonia. Minimal dependent right pleural thickening without frank effusion. Loculated pleural fluid at the anterior right upper lobe measures up to 3 cm, increased since prior. Multiple small pulmonary nodules and nodular densities are new from prior exam, likely infectious. No cavitary lesions. Streaky opacities in the dependent left lower lobe, likely atelectasis. Upper Abdomen: No acute findings on noncontrast exam. Moderate volume of stool in the included colon. Musculoskeletal: Soft tissue thickening involving the right first and second intercostal muscles again seen, less well-defined currently in the absence of IV contrast. There is soft tissue edema in the adjacent right axilla, no subcutaneous fluid collection. No periosteal reaction or bony destructive change. IMPRESSION: 1. Small loculated pleural effusion at the right lung apex adjacent to thickening of the intercostal muscles, slightly increased in size from CT 6 days ago. 2. Scattered nodular densities throughout both lungs, also new, likely infectious. No definite cavitary lesions. Dependent right lower lobe consolidation consistent with pneumonia. Minimal right pleural thickening without sub pulmonic effusion. 3. Small pericardial effusion. Electronically Signed   By: Keith Rake M.D.   On: 08/08/2019 19:14   CT CHEST W  CONTRAST  Result Date: 08/10/2019 CLINICAL DATA:  Right ileo psoas abscess.  Empyema. EXAM: CT CHEST, ABDOMEN, AND PELVIS WITH CONTRAST TECHNIQUE: Multidetector CT imaging of the chest, abdomen and pelvis was performed following the standard protocol during bolus administration of intravenous contrast. CONTRAST:  151mL OMNIPAQUE IOHEXOL 300 MG/ML  SOLN 80 mL OMNIPAQUE IOHEXOL 300 MG/ML  SOLN COMPARISON:  None. August 02, 2019.  August 08, 2019. FINDINGS: CT ABDOMEN PELVIS FINDINGS Hepatobiliary: No focal liver abnormality is seen. No gallstones, gallbladder wall thickening, or biliary dilatation. Pancreas: Unremarkable. No pancreatic ductal dilatation or surrounding inflammatory changes. Spleen: Normal in size without focal abnormality. Adrenals/Urinary Tract: Adrenal glands are unremarkable. Kidneys are normal, without renal calculi, focal lesion, or hydronephrosis. Bladder is unremarkable. Stomach/Bowel: Stomach is within normal limits. Appendix appears normal. No evidence of bowel wall thickening, distention, or inflammatory changes. Vascular/Lymphatic: No significant vascular findings are present. No enlarged abdominal or pelvic lymph nodes. Reproductive: Intrauterine device is noted. No adnexal abnormality is noted. Other: No abdominal wall hernia or abnormality. No abdominopelvic ascites. Musculoskeletal: There is significant enlargement of the right ileo psoas muscle with multiple irregular peripherally enhancing low densities throughout its course concerning for multiple abscesses. This is seen as far  distally as its insertion with the proximal femur. There is also noted a 3.0 x 2.4 cm fluid collection seen in the gluteus medius muscle on the right. 20 x 15 mm fluid collection is noted in right iliacus muscle. Chest: Cardiovascular: No significant vascular findings. Normal heart size. Small pericardial effusion. Mediastinum/Nodes: No enlarged mediastinal, hilar, or axillary lymph nodes. Thyroid gland, trachea,  and esophagus demonstrate no significant findings. Lungs/Pleura: Grossly stable appearance of probable small loculated pleural effusion seen along the lateral and anterior aspect of the right upper lobe. It currently measures 8.6 x 2.7 cm. No pneumothorax is noted. Right posterior atelectasis or infiltrate is noted. Mild left basilar atelectasis is noted. Stable small bilateral lung opacities are noted, most likely infectious in etiology. IMPRESSION: Grossly stable loculated effusion or empyema is seen involving the anterior and lateral portion of the right upper lobe. Grossly stable small bilateral lung opacities are noted, most likely infectious in etiology. Right posterior atelectasis or infiltrate is noted. Continued significant enlargement of right ileo psoas muscle with multiple irregular and peripherally enhancing low densities throughout its course, most consistent with abscesses. Probable abscesses are also noted in the right iliacus muscle as well as in the right gluteus medius muscle. Electronically Signed   By: Marijo Conception M.D.   On: 08/10/2019 13:28   CT ABDOMEN PELVIS W CONTRAST  Result Date: 08/10/2019 CLINICAL DATA:  Right ileo psoas abscess.  Empyema. EXAM: CT CHEST, ABDOMEN, AND PELVIS WITH CONTRAST TECHNIQUE: Multidetector CT imaging of the chest, abdomen and pelvis was performed following the standard protocol during bolus administration of intravenous contrast. CONTRAST:  142mL OMNIPAQUE IOHEXOL 300 MG/ML  SOLN 80 mL OMNIPAQUE IOHEXOL 300 MG/ML  SOLN COMPARISON:  None. August 02, 2019.  August 08, 2019. FINDINGS: CT ABDOMEN PELVIS FINDINGS Hepatobiliary: No focal liver abnormality is seen. No gallstones, gallbladder wall thickening, or biliary dilatation. Pancreas: Unremarkable. No pancreatic ductal dilatation or surrounding inflammatory changes. Spleen: Normal in size without focal abnormality. Adrenals/Urinary Tract: Adrenal glands are unremarkable. Kidneys are normal, without renal  calculi, focal lesion, or hydronephrosis. Bladder is unremarkable. Stomach/Bowel: Stomach is within normal limits. Appendix appears normal. No evidence of bowel wall thickening, distention, or inflammatory changes. Vascular/Lymphatic: No significant vascular findings are present. No enlarged abdominal or pelvic lymph nodes. Reproductive: Intrauterine device is noted. No adnexal abnormality is noted. Other: No abdominal wall hernia or abnormality. No abdominopelvic ascites. Musculoskeletal: There is significant enlargement of the right ileo psoas muscle with multiple irregular peripherally enhancing low densities throughout its course concerning for multiple abscesses. This is seen as far distally as its insertion with the proximal femur. There is also noted a 3.0 x 2.4 cm fluid collection seen in the gluteus medius muscle on the right. 20 x 15 mm fluid collection is noted in right iliacus muscle. Chest: Cardiovascular: No significant vascular findings. Normal heart size. Small pericardial effusion. Mediastinum/Nodes: No enlarged mediastinal, hilar, or axillary lymph nodes. Thyroid gland, trachea, and esophagus demonstrate no significant findings. Lungs/Pleura: Grossly stable appearance of probable small loculated pleural effusion seen along the lateral and anterior aspect of the right upper lobe. It currently measures 8.6 x 2.7 cm. No pneumothorax is noted. Right posterior atelectasis or infiltrate is noted. Mild left basilar atelectasis is noted. Stable small bilateral lung opacities are noted, most likely infectious in etiology. IMPRESSION: Grossly stable loculated effusion or empyema is seen involving the anterior and lateral portion of the right upper lobe. Grossly stable small bilateral lung opacities are noted, most  likely infectious in etiology. Right posterior atelectasis or infiltrate is noted. Continued significant enlargement of right ileo psoas muscle with multiple irregular and peripherally enhancing  low densities throughout its course, most consistent with abscesses. Probable abscesses are also noted in the right iliacus muscle as well as in the right gluteus medius muscle. Electronically Signed   By: Marijo Conception M.D.   On: 08/10/2019 13:28   MR HAND RIGHT WO CONTRAST  Result Date: 08/09/2019 CLINICAL DATA:  Hand pain swelling. EXAM: MRI OF THE RIGHT HAND WITHOUT CONTRAST TECHNIQUE: Multiplanar, multisequence MR imaging of the right hand was performed. No intravenous contrast was administered. COMPARISON:  Radiographs 08/03/2019 FINDINGS: Focal inflammatory changes around the third MCP joint with soft tissue swelling/edema mainly around the dorsum of the hand. Somewhat ill-defined fluid collection measures 14 mm and is superficial to the extensor tendon. The does appear to be some inflammatory tenosynovitis also. A small joint effusion is noted but I do not see any definite MR findings for septic arthritis or osteomyelitis. The flexor tendons appear normal. The radial and ulnar collateral ligaments are intact. No findings for diffuse cellulitis or myofasciitis. IMPRESSION: 1. MR focal inflammatory changes around the third MCP joints as discussed above. Possible ill-defined superficial abscess dorsally. 2. No findings for septic arthritis or osteomyelitis. 3. No findings for diffuse cellulitis or myofasciitis. Electronically Signed   By: Marijo Sanes M.D.   On: 08/09/2019 06:21   CT ASPIRATION  Result Date: 08/11/2019 INDICATION: IV drug use, complex loculated right psoas abscess and small right gluteal abscess EXAM: CT GUIDED DRAINAGE OF RIGHT PSOAS ABSCESS CT ASPIRATION SMALL RIGHT GLUTEAL ABSCESS MEDICATIONS: The patient is currently admitted to the hospital and receiving intravenous antibiotics. The antibiotics were administered within an appropriate time frame prior to the initiation of the procedure. ANESTHESIA/SEDATION: 2.5 mg IV Versed 200 mcg IV Fentanyl Moderate Sedation Time:  35 MINUTES  The patient was continuously monitored during the procedure by the interventional radiology nurse under my direct supervision. COMPLICATIONS: None immediate. TECHNIQUE: Informed written consent was obtained from the patient after a thorough discussion of the procedural risks, benefits and alternatives. All questions were addressed. Maximal Sterile Barrier Technique was utilized including caps, mask, sterile gowns, sterile gloves, sterile drape, hand hygiene and skin antiseptic. A timeout was performed prior to the initiation of the procedure. PROCEDURE: Previous imaging reviewed. Patient position prone. Noncontrast localization CT performed. The right psoas abscess was localized and marked for a posterior paraspinous approach. The right gluteal abscess was also marked for a posterior approach. Right psoas abscess drain: Under sterile conditions and local anesthesia, an 18 gauge 15 cm access was advanced to the right psoas fluid collection. Needle position confirmed with CT. Syringe aspiration yielded thick purulent fluid. Guidewire inserted followed by tract dilatation insert a 12 French drain. Drain catheter position confirmed with CT. Aspiration yielded a total of 15 cc thick purulent fluid. Culture sent for sample. Catheter secured with Prolene suture. Drainage catheter flushed with saline and connected to external suction bulb. Sterile dressing applied. No immediate complication. Patient tolerated the procedure well. Right gluteal abscess aspiration: In a similar fashion, the right gluteal area was sterilely prepped and draped. Under sterile conditions and local anesthesia, CT guidance utilized to advance an 18 gauge 10 cm access needle into the right gluteal intramuscular fluid collection. Needle position confirmed with CT. Syringe aspiration yielded similar thick purulent fluid approximately 8 cc. Sample sent for culture. Needle removed. No immediate complication. Patient tolerated the procedure  well.  FINDINGS: CT imaging confirms needle placed in the right psoas abscess for drain placement. CT imaging confirms needle placement in the right gluteal abscess for aspiration. IMPRESSION: Successful CT-guided right psoas abscess drain placement Successful CT-guided right gluteal abscess aspiration. Cultures sent for both sites. Electronically Signed   By: Jerilynn Mages.  Shick M.D.   On: 08/11/2019 10:59   CT IMAGE GUIDED DRAINAGE BY PERCUTANEOUS CATHETER  Result Date: 08/11/2019 INDICATION: IV drug use, complex loculated right psoas abscess and small right gluteal abscess EXAM: CT GUIDED DRAINAGE OF RIGHT PSOAS ABSCESS CT ASPIRATION SMALL RIGHT GLUTEAL ABSCESS MEDICATIONS: The patient is currently admitted to the hospital and receiving intravenous antibiotics. The antibiotics were administered within an appropriate time frame prior to the initiation of the procedure. ANESTHESIA/SEDATION: 2.5 mg IV Versed 200 mcg IV Fentanyl Moderate Sedation Time:  35 MINUTES The patient was continuously monitored during the procedure by the interventional radiology nurse under my direct supervision. COMPLICATIONS: None immediate. TECHNIQUE: Informed written consent was obtained from the patient after a thorough discussion of the procedural risks, benefits and alternatives. All questions were addressed. Maximal Sterile Barrier Technique was utilized including caps, mask, sterile gowns, sterile gloves, sterile drape, hand hygiene and skin antiseptic. A timeout was performed prior to the initiation of the procedure. PROCEDURE: Previous imaging reviewed. Patient position prone. Noncontrast localization CT performed. The right psoas abscess was localized and marked for a posterior paraspinous approach. The right gluteal abscess was also marked for a posterior approach. Right psoas abscess drain: Under sterile conditions and local anesthesia, an 18 gauge 15 cm access was advanced to the right psoas fluid collection. Needle position confirmed with  CT. Syringe aspiration yielded thick purulent fluid. Guidewire inserted followed by tract dilatation insert a 12 French drain. Drain catheter position confirmed with CT. Aspiration yielded a total of 15 cc thick purulent fluid. Culture sent for sample. Catheter secured with Prolene suture. Drainage catheter flushed with saline and connected to external suction bulb. Sterile dressing applied. No immediate complication. Patient tolerated the procedure well. Right gluteal abscess aspiration: In a similar fashion, the right gluteal area was sterilely prepped and draped. Under sterile conditions and local anesthesia, CT guidance utilized to advance an 18 gauge 10 cm access needle into the right gluteal intramuscular fluid collection. Needle position confirmed with CT. Syringe aspiration yielded similar thick purulent fluid approximately 8 cc. Sample sent for culture. Needle removed. No immediate complication. Patient tolerated the procedure well. FINDINGS: CT imaging confirms needle placed in the right psoas abscess for drain placement. CT imaging confirms needle placement in the right gluteal abscess for aspiration. IMPRESSION: Successful CT-guided right psoas abscess drain placement Successful CT-guided right gluteal abscess aspiration. Cultures sent for both sites. Electronically Signed   By: Jerilynn Mages.  Shick M.D.   On: 08/11/2019 10:59   IR THORACENTESIS ASP PLEURAL SPACE W/IMG GUIDE  Result Date: 08/09/2019 INDICATION: 34 year old with a small right chest empyema. EXAM: ULTRASOUND GUIDED RIGHT THORACENTESIS MEDICATIONS: None. COMPLICATIONS: None immediate. PROCEDURE: An ultrasound guided thoracentesis was thoroughly discussed with the patient and questions answered. The benefits, risks, alternatives and complications were also discussed. The patient understands and wishes to proceed with the procedure. Written consent was obtained. Ultrasound was used to evaluate the right upper chest. Small complex pleural-based  collection was identified in the right upper chest. Skin was prepped with chlorhexidine and sterile field was created. Skin was anesthetized with 1% lidocaine. A 19 gauge, 7 cm Yueh catheter was directed into the small collection under real-time  ultrasound guidance. Approximately 13 mL of thick yellow purulent fluid was aspirated. Fluid was collected and sent for culture. Bandage placed over the puncture site. FINDINGS: Small irregular pleural-based collection in the anterior right upper chest. This collection corresponds with the small pleural-based collection on recent CT imaging. Yueh catheter was successfully placed within the collection and 13 mL of thick yellow purulent fluid was aspirated. Collection was slightly smaller based on ultrasound after the fluid had been aspirated. IMPRESSION: Successful ultrasound guided right thoracentesis yielding 13 mL of purulent fluid from the small right chest empyema. Electronically Signed   By: Markus Daft M.D.   On: 08/09/2019 18:27    Labs:  CBC: Recent Labs    08/06/19 0541 08/07/19 0615 08/08/19 0339 08/11/19 0427  WBC 11.0* 13.3* 11.9* 10.7*  HGB 10.4* 10.1* 10.0* 9.8*  HCT 31.5* 31.6* 30.8* 30.9*  PLT 437* 425* 464* 598*    COAGS: Recent Labs    08/03/19 0250 08/11/19 0427  INR 1.3* 1.3*    BMP: Recent Labs    08/06/19 0541 08/06/19 0541 08/07/19 0615 08/08/19 0339 08/10/19 0500 08/11/19 0427  NA 135  --  139 135  --  136  K 3.1*  --  4.6 4.1  --  3.8  CL 96*  --  106 102  --  97*  CO2 28  --  25 24  --  26  GLUCOSE 141*  --  112* 148*  --  165*  BUN <5*  --  <5* 7  --  7  CALCIUM 7.7*  --  7.8* 8.0*  --  8.6*  CREATININE 0.54   < > 0.36* 0.45 0.50 0.46  GFRNONAA >60   < > >60 >60 >60 >60  GFRAA >60   < > >60 >60 >60 >60   < > = values in this interval not displayed.    LIVER FUNCTION TESTS: Recent Labs    08/06/19 0541 08/07/19 0615 08/08/19 0339 08/11/19 0427  BILITOT 0.5 0.7 0.3 0.3  AST 57* 29 35 54*  ALT  48* 42 53* 74*  ALKPHOS 127* 106 122 233*  PROT 5.6* 5.4* 5.7* 6.8  ALBUMIN 1.8* 1.7* 1.7* 2.0*    Assessment and Plan:  34 y/o F s/p RLQ drain placement 08/12/19 in IR seen today for routine follow up. Per I/O 60 cc output listed under "other" which appears to correspond with the drain. ~20 cc thick, bloody, purulent OP in suction bulb on exam today. Insertion site is appropriately tender.  Preliminary cx of both aspirates (+) for staph aureus so far - currently receiving Daptomycin per primary team. Tmax 102.8, hypotensive but near baseline, WBC improved to 9.4, hgb stable at 9.5.  Continue Qshift flushes with 5 cc NS, record output Qshift, dressing changes QD or PRN if soiled, call IR if difficulty flushing or sudden decrease in output.   Plan for repeat CT/possible injection once output <10 mL/QD (excluding flush) - if patient is to be discharged prior to this occurring they will need to follow up with IR clinic as an outpatient typically 10-14 days post d/c, IR scheduler will call patient with date/time. Upon d/c drain to be flushed QD with 5 cc NS (will need rx from primary team at d/c), record output QD, dressing changes every 2-3 days or sooner if soiled.   IR will continue to follow - please call with questions or concerns.   Electronically Signed: Joaquim Nam, PA-C 08/12/2019, 12:59 PM   I spent  a total of 15 Minutes at the the patient's bedside AND on the patient's hospital floor or unit, greater than 50% of which was counseling/coordinating care for right psoas abscess drain follow up.

## 2019-08-12 NOTE — Progress Notes (Signed)
Pt asked for dilaudid for pain on her hip, BP 95/53. RN paged Aileen Fass, MD, received verbal order to hold it right now, recheck BP in an hour and give dilaudid if SBP> 100.

## 2019-08-12 NOTE — Plan of Care (Signed)

## 2019-08-12 NOTE — Progress Notes (Addendum)
SWOT RN Rip Harbour went to MRI to give IV dilaudid to patient.  Patient back on bed and stating she did not want to do the MRI today even with dilaudid on board for pain.  Patient returned to unit.  Dr. Aileen Fass notified, verbal received to dc IV dilaudid order.

## 2019-08-12 NOTE — Progress Notes (Signed)
Pt brought down for MRI of R. Hip. Pt unable to tolerate pain and refused exam. Pain meds were going to be given, but pt ended up refusing the exam even though meds had already been brought down. Extender, Nurse Rip Harbour), and I both spoke to pt but she decided to go to room. Pt to inform nurse when pain is more tolerable and able to go through MRI.

## 2019-08-13 ENCOUNTER — Encounter (HOSPITAL_COMMUNITY): Payer: Self-pay | Admitting: Pulmonary Disease

## 2019-08-13 ENCOUNTER — Other Ambulatory Visit: Payer: Self-pay

## 2019-08-13 LAB — COMPREHENSIVE METABOLIC PANEL
ALT: 69 U/L — ABNORMAL HIGH (ref 0–44)
AST: 56 U/L — ABNORMAL HIGH (ref 15–41)
Albumin: 2 g/dL — ABNORMAL LOW (ref 3.5–5.0)
Alkaline Phosphatase: 164 U/L — ABNORMAL HIGH (ref 38–126)
Anion gap: 8 (ref 5–15)
BUN: 7 mg/dL (ref 6–20)
CO2: 26 mmol/L (ref 22–32)
Calcium: 8.7 mg/dL — ABNORMAL LOW (ref 8.9–10.3)
Chloride: 103 mmol/L (ref 98–111)
Creatinine, Ser: 0.45 mg/dL (ref 0.44–1.00)
GFR calc Af Amer: >60 mL/min (ref >60)
GFR calc non Af Amer: >60 mL/min (ref >60)
Glucose, Bld: 124 mg/dL — ABNORMAL HIGH (ref 70–99)
Potassium: 4 mmol/L (ref 3.5–5.1)
Sodium: 137 mmol/L (ref 135–145)
Total Bilirubin: 0.3 mg/dL (ref 0.3–1.2)
Total Protein: 6.6 g/dL (ref 6.5–8.1)

## 2019-08-13 LAB — CBC
HCT: 29.5 % — ABNORMAL LOW (ref 36.0–46.0)
Hemoglobin: 9.1 g/dL — ABNORMAL LOW (ref 12.0–15.0)
MCH: 26.9 pg (ref 26.0–34.0)
MCHC: 30.8 g/dL (ref 30.0–36.0)
MCV: 87.3 fL (ref 80.0–100.0)
Platelets: 621 10*3/uL — ABNORMAL HIGH (ref 150–400)
RBC: 3.38 MIL/uL — ABNORMAL LOW (ref 3.87–5.11)
RDW: 14.6 % (ref 11.5–15.5)
WBC: 7.7 10*3/uL (ref 4.0–10.5)
nRBC: 0 % (ref 0.0–0.2)

## 2019-08-13 MED ORDER — DULOXETINE HCL 30 MG PO CPEP
30.0000 mg | ORAL_CAPSULE | Freq: Every day | ORAL | Status: DC
  Administered 2019-08-13 – 2019-09-03 (×23): 30 mg via ORAL
  Filled 2019-08-13 (×22): qty 1

## 2019-08-13 MED ORDER — GABAPENTIN 600 MG PO TABS
300.0000 mg | ORAL_TABLET | Freq: Three times a day (TID) | ORAL | Status: DC
  Administered 2019-08-13 – 2019-08-22 (×29): 300 mg via ORAL
  Filled 2019-08-13 (×29): qty 1

## 2019-08-13 MED ORDER — QUETIAPINE FUMARATE ER 50 MG PO TB24
150.0000 mg | ORAL_TABLET | Freq: Every day | ORAL | Status: DC
  Administered 2019-08-13 – 2019-09-02 (×21): 150 mg via ORAL
  Filled 2019-08-13 (×23): qty 3

## 2019-08-13 MED ORDER — OXYCODONE HCL 5 MG PO TABS
5.0000 mg | ORAL_TABLET | ORAL | Status: DC | PRN
  Administered 2019-08-13 – 2019-08-20 (×30): 10 mg via ORAL
  Administered 2019-08-20: 5 mg via ORAL
  Administered 2019-08-20 (×2): 10 mg via ORAL
  Administered 2019-08-20: 5 mg via ORAL
  Administered 2019-08-21 – 2019-08-31 (×52): 10 mg via ORAL
  Filled 2019-08-13 (×16): qty 2
  Filled 2019-08-13: qty 1
  Filled 2019-08-13 (×66): qty 2
  Filled 2019-08-13: qty 1
  Filled 2019-08-13 (×3): qty 2

## 2019-08-13 NOTE — Progress Notes (Signed)
Spoke with RN regarding reattempt of patient's MRI exam in AM of 3/14. Per RN reattempt was discussed with the patient Leslie Molina at the time of her morning pain medication and Leslie Molina is still unwilling to reattemtp MRI.

## 2019-08-13 NOTE — Progress Notes (Signed)
[facility] PROGRESS NOTE    Progress Note  Leslie Molina  [id number] DOB: 1985/06/28 DOA: 08/03/2019 PCP: [facility]     Brief Narrative:   Leslie Molina is an 34 y.o. 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  The abscess extends to the posterior aspect of the proximal right thigh.  Culture data: COVID 3/3 >negative  MRSA PCR 3/3 >Positive  Blood culture 3/3 >MRSA  Pelvic abcess culture 3/3 >Abundant Staph aures, full result pending.  Antibiotic regimen: Daptomycin 3/3 once Cefepime 3/3-3/4 Clindamycin 3/3 once Vancomycin 3/3>>   Assessment/Plan:   Sepsis secondary to MRSA bacteremia and iliopsoas abscess/osteomyelitis of the sacrum/myositis and empyema: CT of the neck on 08/06/2019 showed multiple peripheral predominant nodules at the lung apices suspicious for septic emboli and a right empyema with inflammation and stranding on the right retroclavicular region and right lower neck consistent with soft tissue infection. Cardiothoracic surgery was consulted who recommended drainage of the empyema. Blood cultures from 08/03/2019 show staph aureus repeated blood cultures from 08/05/2019 showed no growth over 20. IR was consulted who performe ultrasound-guided thoracentesis on 08/09/2019, aspirated 14 mL of purulent material.  Culture grew MRSA. Patient continues to spike a temperature, remains tachycardic, she was continued on IV vancomycin. IR was reconsulted to perform gluteal abscess and right iliopsoas abscess needle aspiration on 08/11/2019 see below for further details.   Patient has defervesced her leukocytosis is improving, she did not  tolerate the MRI of the right hip. Infectious disease recommended to continue treatment.  Iliopsoas/gluteal abscess/osteomyelitis of the sacrum and possible infection of the right dorsum hand and right sternoclavicular joint infection: MRI of the hip and lumbar spine as below. She is currently on IV Dilaudid and oral oxycodone. IR consulted with drain placement of gluteal abscess and CT-guided right gluteal cyst aspiration performed on 3.12.2021.  Culture from both sides was sent Abdominal pain improved.  LFTs are improving alkaline phosphatase is down to 167.  Peritonitis/IUD perforation: CT scan of the abdomen her abdominal exam is benign. Gynecology and general surgery was consulted and they have signed off. They recommended no surgical intervention at this time, no surgical intervention at this time.  Hypokalemia: Repleted orally now resolved.  Bipolar disorder/anxiety/depression: Home medications Cymbalta, Seroquel trazodone were held.  Narcotic seeking behavior: Patient is in a bad mood as her narcotics were decreased yesterday as she was sleepy and not able to carry on a conversation, she was groggy and did not have her breakfast yesterday until after 10 AM.  After this she has been complaining and arguing as she wants her narcotics increase. She threatened me today saying that her mom was coming over today to speak with [place].   DVT prophylaxis: heparin Family Communication:none Disposition Plan/Barrier to D/C: She will probably be discharged when she has completed her course of IV antibiotic she is not a candidate to go home with a PICC line and 6 weeks of IV antibiotics.  Code Status:     Code Status Orders  (From admission, onward)         Start     Ordered   08/03/19 1729  Full code  Continuous  08/03/19 1729        Code Status History    Date Active Date Inactive Code Status Order ID Comments User Context   08/02/2019 1859 08/03/2019 1708 Full Code [id number]   Collier Bullock, MD ED   Advance Care Planning Activity        IV Access:    Peripheral IV   Procedures and diagnostic studies:   CT ASPIRATION  Result Date: 08/11/2019 INDICATION: IV drug use, complex loculated right psoas abscess and small right gluteal abscess EXAM: CT GUIDED DRAINAGE OF RIGHT PSOAS ABSCESS CT ASPIRATION SMALL RIGHT GLUTEAL ABSCESS MEDICATIONS: The patient is currently admitted to the hospital and receiving intravenous antibiotics. The antibiotics were administered within an appropriate time frame prior to the initiation of the procedure. ANESTHESIA/SEDATION: 2.5 mg IV Versed 200 mcg IV Fentanyl Moderate Sedation Time:  35 MINUTES The patient was continuously monitored during the procedure by the interventional radiology nurse under my direct supervision. COMPLICATIONS: None immediate. TECHNIQUE: Informed written consent was obtained from the patient after a thorough discussion of the procedural risks, benefits and alternatives. All questions were addressed. Maximal Sterile Barrier Technique was utilized including caps, mask, sterile gowns, sterile gloves, sterile drape, hand hygiene and skin antiseptic. A timeout was performed prior to the initiation of the procedure. PROCEDURE: Previous imaging reviewed. Patient position prone. Noncontrast localization CT performed. The right psoas abscess was localized and marked for a posterior paraspinous approach. The right gluteal abscess was also marked for a posterior approach. Right psoas abscess drain: Under sterile conditions and local anesthesia, an 18 gauge 15 cm access was advanced to the right psoas fluid collection. Needle position confirmed with CT. Syringe aspiration yielded thick purulent fluid. Guidewire inserted followed by tract dilatation insert a 12 French drain. Drain catheter position confirmed with CT. Aspiration yielded a total of 15 cc thick purulent fluid. Culture sent for sample. Catheter secured with Prolene  suture. Drainage catheter flushed with saline and connected to external suction bulb. Sterile dressing applied. No immediate complication. Patient tolerated the procedure well. Right gluteal abscess aspiration: In a similar fashion, the right gluteal area was sterilely prepped and draped. Under sterile conditions and local anesthesia, CT guidance utilized to advance an 18 gauge 10 cm access needle into the right gluteal intramuscular fluid collection. Needle position confirmed with CT. Syringe aspiration yielded similar thick purulent fluid approximately 8 cc. Sample sent for culture. Needle removed. No immediate complication. Patient tolerated the procedure well. FINDINGS: CT imaging confirms needle placed in the right psoas abscess for drain placement. CT imaging confirms needle placement in the right gluteal abscess for aspiration. IMPRESSION: Successful CT-guided right psoas abscess drain placement Successful CT-guided right gluteal abscess aspiration. Cultures sent for both sites. Electronically Signed   By: Jerilynn Mages.  Shick M.D.   On: 08/11/2019 10:59   CT IMAGE GUIDED DRAINAGE BY PERCUTANEOUS CATHETER  Result Date: 08/11/2019 INDICATION: IV drug use, complex loculated right psoas abscess and small right gluteal abscess EXAM: CT GUIDED DRAINAGE OF RIGHT PSOAS ABSCESS CT ASPIRATION SMALL RIGHT GLUTEAL ABSCESS MEDICATIONS: The patient is currently admitted to the hospital and receiving intravenous antibiotics. The antibiotics were administered within an appropriate time frame prior to the initiation of the procedure. ANESTHESIA/SEDATION: 2.5 mg IV Versed 200 mcg IV Fentanyl Moderate Sedation Time:  35 MINUTES The patient was continuously monitored during the procedure by the interventional radiology nurse under my direct supervision. COMPLICATIONS: None immediate. TECHNIQUE: Informed written consent was obtained from the patient after a thorough discussion of  the procedural risks, benefits and alternatives. All  questions were addressed. Maximal Sterile Barrier Technique was utilized including caps, mask, sterile gowns, sterile gloves, sterile drape, hand hygiene and skin antiseptic. A timeout was performed prior to the initiation of the procedure. PROCEDURE: Previous imaging reviewed. Patient position prone. Noncontrast localization CT performed. The right psoas abscess was localized and marked for a posterior paraspinous approach. The right gluteal abscess was also marked for a posterior approach. Right psoas abscess drain: Under sterile conditions and local anesthesia, an 18 gauge 15 cm access was advanced to the right psoas fluid collection. Needle position confirmed with CT. Syringe aspiration yielded thick purulent fluid. Guidewire inserted followed by tract dilatation insert a 12 French drain. Drain catheter position confirmed with CT. Aspiration yielded a total of 15 cc thick purulent fluid. Culture sent for sample. Catheter secured with Prolene suture. Drainage catheter flushed with saline and connected to external suction bulb. Sterile dressing applied. No immediate complication. Patient tolerated the procedure well. Right gluteal abscess aspiration: In a similar fashion, the right gluteal area was sterilely prepped and draped. Under sterile conditions and local anesthesia, CT guidance utilized to advance an 18 gauge 10 cm access needle into the right gluteal intramuscular fluid collection. Needle position confirmed with CT. Syringe aspiration yielded similar thick purulent fluid approximately 8 cc. Sample sent for culture. Needle removed. No immediate complication. Patient tolerated the procedure well. FINDINGS: CT imaging confirms needle placed in the right psoas abscess for drain placement. CT imaging confirms needle placement in the right gluteal abscess for aspiration. IMPRESSION: Successful CT-guided right psoas abscess drain placement Successful CT-guided right gluteal abscess aspiration. Cultures sent for  both sites. Electronically Signed   By: Jerilynn Mages.  Shick M.D.   On: 08/11/2019 10:59     Medical Consultants:    None.  Anti-Infectives:   IV vancomycin  Subjective:    Leslie Molina not in a good mood this morning as she wants her narcotics increase.  Objective:    Vitals:   08/12/19 2310 08/13/19 0009 08/13/19 0347 08/13/19 0758  BP: 103/61  (!) 92/55 94/70  Pulse:   73   Resp:      Temp:  99.5 F (37.5 C) 97.7 F (36.5 C) 98.1 F (36.7 C)  TempSrc:  Oral Oral Oral  SpO2:   100% 98%  Weight:   66.7 kg   Height:       SpO2: 98 % O2 Flow Rate (L/min): 2 L/min   Intake/Output Summary (Last 24 hours) at 08/13/2019 0852 Last data filed at 08/13/2019 0348 Gross per 24 hour  Intake 1080 ml  Output 2560 ml  Net -1480 ml   Filed Weights   08/11/19 0500 08/12/19 0416 08/13/19 0347  Weight: 66.5 kg 68.4 kg 66.7 kg    Exam: General exam: In no acute distress. Respiratory system: Good air movement and clear to auscultation. Cardiovascular system: S1 & S2 heard, RRR. No JVD. Gastrointestinal system: Abdomen is soft, positive bowel sounds soft nontender nondistended Central nervous system: Alert and oriented. No focal neurological deficits. Extremities: No pedal edema. Skin: No rashes, lesions or ulcers Psychiatry: Judgement and insight appear normal.    Data Reviewed:    Labs: Basic Metabolic Panel: Recent Labs  Lab 08/07/19 0615 08/07/19 0615 08/08/19 0339 08/08/19 0339 08/10/19 0500 08/11/19 0427 08/11/19 0427 08/12/19 1317 08/13/19 0535  NA 139  --  135  --   --  136  --  137 137  K 4.6   < >  4.1   < >  --  3.8   < > 3.8 4.0  CL 106  --  102  --   --  97*  --  100 103  CO2 25  --  24  --   --  26  --  26 26  GLUCOSE 112*  --  148*  --   --  165*  --  116* 124*  BUN <5*  --  7  --   --  7  --  5* 7  CREATININE 0.36*   < > 0.45  --  0.50 0.46  --  0.46 0.45  CALCIUM 7.8*  --  8.0*  --   --  8.6*  --  8.2* 8.7*  MG 2.1  --  2.0  --   --   --   --   --    --    < > = values in this interval not displayed.   GFR Estimated Creatinine Clearance: 86.7 mL/min (by C-G formula based on SCr of 0.45 mg/dL). Liver Function Tests: Recent Labs  Lab 08/07/19 0615 08/08/19 0339 08/11/19 0427 08/12/19 1317 08/13/19 0535  AST 29 35 54* 25 56*  ALT 42 53* 74* 45* 69*  ALKPHOS 106 122 233* 183* 164*  BILITOT 0.7 0.3 0.3 0.2* 0.3  PROT 5.4* 5.7* 6.8 6.9 6.6  ALBUMIN 1.7* 1.7* 2.0* 2.1* 2.0*   No results for input(s): LIPASE, AMYLASE in the last 168 hours. No results for input(s): AMMONIA in the last 168 hours. Coagulation profile Recent Labs  Lab 08/11/19 0427  INR 1.3*   COVID-19 Labs  No results for input(s): DDIMER, FERRITIN, LDH, CRP in the last 72 hours.  Lab Results  Component Value Date   [place] NEGATIVE 08/02/2019   [place] NEGATIVE 07/29/2019    CBC: Recent Labs  Lab 08/07/19 0615 08/08/19 0339 08/11/19 0427 08/12/19 1317 08/13/19 0535  WBC 13.3* 11.9* 10.7* 9.4 7.7  NEUTROABS  --   --  8.3*  --   --   HGB 10.1* 10.0* 9.8* 9.5* 9.1*  HCT 31.6* 30.8* 30.9* 29.3* 29.5*  MCV 87.1 86.5 86.6 84.9 87.3  PLT 425* 464* 598* 617* 621*   Cardiac Enzymes: Recent Labs  Lab 08/10/19 1944  CKTOTAL 37*   BNP (last 3 results) No results for input(s): PROBNP in the last 8760 hours. CBG: No results for input(s): GLUCAP in the last 168 hours. D-Dimer: No results for input(s): DDIMER in the last 72 hours. Hgb A1c: No results for input(s): HGBA1C in the last 72 hours. Lipid Profile: No results for input(s): CHOL, HDL, LDLCALC, TRIG, CHOLHDL, LDLDIRECT in the last 72 hours. Thyroid function studies: No results for input(s): TSH, T4TOTAL, T3FREE, THYROIDAB in the last 72 hours.  Invalid input(s): FREET3 Anemia work up: No results for input(s): VITAMINB12, FOLATE, FERRITIN, TIBC, IRON, RETICCTPCT in the last 72 hours. Sepsis Labs: Recent Labs  Lab 08/08/19 0339 08/11/19 0427 08/12/19 1317 08/13/19 0535  WBC  11.9* 10.7* 9.4 7.7   Microbiology Recent Results (from the past 240 hour(s))  CULTURE, BLOOD (ROUTINE X 2) w Reflex to ID Panel     Status: Abnormal   Collection Time: 08/03/19 10:17 AM   Specimen: BLOOD  Result Value Ref Range Status   Specimen Description   Final    BLOOD LEFT ANTECUBITAL Performed at [facility], [address], [place], [place] [address]    Special Requests   Final    BOTTLES DRAWN AEROBIC  AND ANAEROBIC Blood Culture adequate volume Performed at [facility], [address] [place], [place] [address]    Culture  Setup Time   Final    GRAM POSITIVE COCCI IN BOTH AEROBIC AND ANAEROBIC BOTTLES CRITICAL VALUE NOTED.  VALUE IS CONSISTENT WITH PREVIOUSLY REPORTED AND CALLED VALUE. Performed at [facility], [address] [place], [place] [address]    Culture (A)  Final    STAPHYLOCOCCUS AUREUS SUSCEPTIBILITIES PERFORMED ON PREVIOUS CULTURE WITHIN THE LAST 5 DAYS. Performed at [facility], [place] [address] [place], [place] [address]    Report Status 08/06/2019 FINAL  Final  CULTURE, BLOOD (ROUTINE X 2) w Reflex to ID Panel     Status: Abnormal   Collection Time: 08/03/19 10:32 AM   Specimen: BLOOD  Result Value Ref Range Status   Specimen Description   Final    BLOOD BLOOD LEFT HAND Performed at [facility], [address], [place], [place] [address]    Special Requests   Final    BOTTLES DRAWN AEROBIC AND ANAEROBIC Blood Culture adequate volume Performed at [facility], [address] [place], [place] [address]    Culture  Setup Time   Final    GRAM POSITIVE COCCI IN BOTH AEROBIC AND ANAEROBIC BOTTLES CRITICAL RESULT CALLED TO, READ BACK BY AND VERIFIED WITH: [place] [id number] 08/04/19 HNM Performed at [facility], [address] [place], [place] [address]    Culture (A)  Final    STAPHYLOCOCCUS AUREUS SUSCEPTIBILITIES PERFORMED ON PREVIOUS CULTURE WITHIN THE LAST 5  DAYS. Performed at [facility], [place] [address], [place], [place] [address]    Report Status 08/06/2019 FINAL  Final  Culture, blood (Routine X 2) w Reflex to ID Panel     Status: None   Collection Time: 08/05/19  7:18 AM   Specimen: BLOOD  Result Value Ref Range Status   Specimen Description BLOOD LEFT ANTECUBITAL  Final   Special Requests   Final    BOTTLES DRAWN AEROBIC AND ANAEROBIC Blood Culture adequate volume   Culture   Final    NO GROWTH 5 DAYS Performed at [facility], [place] [address] [place], [place] [address]    Report Status 08/10/2019 FINAL  Final  Culture, blood (Routine X 2) w Reflex to ID Panel     Status: None   Collection Time: 08/05/19  7:19 AM   Specimen: BLOOD LEFT HAND  Result Value Ref Range Status   Specimen Description BLOOD LEFT HAND  Final   Special Requests   Final    BOTTLES DRAWN AEROBIC AND ANAEROBIC Blood Culture adequate volume   Culture   Final    NO GROWTH 5 DAYS Performed at [facility], [place] [address] [place], [place] [address]    Report Status 08/10/2019 FINAL  Final  Body fluid culture     Status: None   Collection Time: 08/09/19  4:54 PM   Specimen: Body Fluid  Result Value Ref Range Status   Specimen Description FLUID PLEURAL RIGHT  Final   Special Requests NONE  Final   Gram Stain   Final    ABUNDANT WBC PRESENT, PREDOMINANTLY PMN MODERATE GRAM POSITIVE COCCI Performed at [facility], [address] [place],  [address]    Culture FEW METHICILLIN RESISTANT STAPHYLOCOCCUS AUREUS  Final   Report Status 08/12/2019 FINAL  Final   Organism ID, Bacteria METHICILLIN RESISTANT STAPHYLOCOCCUS AUREUS  Final      Susceptibility   Methicillin resistant staphylococcus aureus - MIC*    CIPROFLOXACIN >=8 RESISTANT Resistant     ERYTHROMYCIN >=8 RESISTANT Resistant     GENTAMICIN <=0.5 SENSITIVE Sensitive     OXACILLIN >=4 RESISTANT Resistant     TETRACYCLINE <=1 SENSITIVE Sensitive     VANCOMYCIN 1  SENSITIVE Sensitive     TRIMETH/SULFA <=10 SENSITIVE Sensitive     CLINDAMYCIN <=0.25 SENSITIVE Sensitive     RIFAMPIN <=0.5 SENSITIVE Sensitive     Inducible Clindamycin NEGATIVE Sensitive     * FEW METHICILLIN RESISTANT STAPHYLOCOCCUS AUREUS  Aerobic/Anaerobic Culture (surgical/deep wound)     Status: None (Preliminary result)   Collection Time: 08/11/19 10:35 AM   Specimen: Abscess  Result Value Ref Range Status   Specimen Description ABSCESS RIGHT  Final   Special Requests Normal  Final   Gram Stain   Final    ABUNDANT WBC PRESENT, PREDOMINANTLY PMN ABUNDANT GRAM POSITIVE COCCI Performed at [facility], [address], [place], [place] [address]    Culture ABUNDANT STAPHYLOCOCCUS AUREUS  Final   Report Status PENDING  Incomplete  Aerobic/Anaerobic Culture (surgical/deep wound)     Status: None (Preliminary result)   Collection Time: 08/11/19 10:38 AM   Specimen: Abscess  Result Value Ref Range Status   Specimen Description ABSCESS RIGHT GLUTEAL  Final   Special Requests Normal  Final   Gram Stain   Final    FEW WBC PRESENT, PREDOMINANTLY PMN NO ORGANISMS SEEN Performed at [facility], [address] [place], [place] [address]    Culture FEW STAPHYLOCOCCUS AUREUS  Final   Report Status PENDING  Incomplete     Medications:   . Chlorhexidine Gluconate Cloth  6 each Topical Daily  . docusate sodium  100 mg Oral BID  . enoxaparin (LOVENOX) injection  40 mg Subcutaneous QHS  . feeding supplement (ENSURE ENLIVE)  237 mL Oral BID BM  . polyethylene glycol  17 g Oral Daily  . sodium chloride flush  10-40 mL Intracatheter Q12H  . sodium chloride flush  5 mL Intracatheter Q8H  . traZODone  150 mg Oral QHS   Continuous Infusions: . sodium chloride 250 mL (08/08/19 1839)  . ceFTAROline (TEFLARO) IV 600 mg (08/13/19 0402)  . DAPTOmycin (CUBICIN)  IV 500 mg (08/12/19 2023)      LOS: 10 days   Charlynne Cousins  [facility]  08/13/2019, 8:52 AM

## 2019-08-13 NOTE — Progress Notes (Signed)
Pharmacy Antibiotic Note  Leslie Molina is a 34 y.o. female admitted on 08/03/2019 with MRSA bacteremia/abscess w/ osteo of sacrum and ischium. Marland Kitchen  Pharmacy has been consulted for Dapto/Ceftaroline dosing.   ID: MRSA bacteremia/abscess w/ osteo of sacrum and ischium. Abx changed 3/11 due to fever. Aspiration of gluteal abscess on 3/12. TEE to evaluate for endocarditis - 3/7 neck CT show loculated infection right apex of pleural space and septic emboli . CT surgery recommended drainage of empyema. - 3/12: IR placed gluteal drains in R psoas with purulent sanguinous fluid. - WBC 7.7 down, Tmax 101. Refusing MRI  Antimicrobials this admission: 3/3 metronidazole x 1 3/3 vancomycin >> 3/11 3/3 cefepime >>3/5 3/4 clindamycin >>3/5 3/11 Ceftaroline>> 3/4 daptomycin 500mg  x 1; resume 3/11 >> -- 3/11 CK 37    Dose adjustments this admission: 3/6: VP= 14, VT= 4; AUC  3/10 VP= 30, VT= 11  Microbiology results: 3/4 repeats: staph aureus 3/3 BCx: GPC clusters 4/4 bottles  (BCID = MRSA) 3/3 iliopsoas abscess:  MRSA  3/6 blood x2- neg 3/10 pleural fluid- staph aureus   Plan: -Dapto 500mg  (8 mg/kg rounded down for vial size) per ID for osteo/bacteremia  - -CK levels q Thursday -Ceftaroline 600 mg IV q8h (dosing ok per Dorothea Ogle) - Remain hospitalized for abx treatment. - Will try to discuss PTA Psych meds with pt this afternoon to clarify.   Height: 5\' 1"  (154.9 cm) Weight: 147 lb 0.8 oz (66.7 kg) IBW/kg (Calculated) : 47.8  Temp (24hrs), Avg:99.1 F (37.3 C), Min:97.7 F (36.5 C), Max:101 F (38.3 C)  Recent Labs  Lab 08/06/19 1750 08/06/19 2100 08/07/19 0615 08/07/19 0615 08/08/19 0339 08/09/19 0500 08/09/19 0959 08/10/19 0500 08/11/19 0427 08/12/19 1317 08/13/19 0535  WBC  --   --  13.3*  --  11.9*  --   --   --  10.7* 9.4 7.7  CREATININE  --   --  0.36*   < > 0.45  --   --  0.50 0.46 0.46 0.45  VANCOTROUGH  --  7*  --   --   --   --  11*  --   --   --   --   VANCOPEAK 19*   --   --   --   --  30  --   --   --   --   --    < > = values in this interval not displayed.    Estimated Creatinine Clearance: 86.7 mL/min (by C-G formula based on SCr of 0.45 mg/dL).    No Known Allergies  Elenor Wildes S. Alford Highland, PharmD, BCPS Clinical Staff Pharmacist [email or web address]  Wayland Salinas 08/13/2019 10:10 AM

## 2019-08-13 NOTE — Progress Notes (Signed)
MRI called RN to determine if pt will agree to have MRI completed today. RN went in pt's room to administer medication and explained importance of MRI. Pt continues to refuse and states she is in too much pain. Will continue to monitor.

## 2019-08-14 DIAGNOSIS — Z95828 Presence of other vascular implants and grafts: Secondary | ICD-10-CM

## 2019-08-14 DIAGNOSIS — L0231 Cutaneous abscess of buttock: Secondary | ICD-10-CM

## 2019-08-14 LAB — COMPREHENSIVE METABOLIC PANEL
ALT: 74 U/L — ABNORMAL HIGH (ref 0–44)
AST: 43 U/L — ABNORMAL HIGH (ref 15–41)
Albumin: 2 g/dL — ABNORMAL LOW (ref 3.5–5.0)
Alkaline Phosphatase: 149 U/L — ABNORMAL HIGH (ref 38–126)
Anion gap: 14 (ref 5–15)
BUN: 5 mg/dL — ABNORMAL LOW (ref 6–20)
CO2: 26 mmol/L (ref 22–32)
Calcium: 9 mg/dL (ref 8.9–10.3)
Chloride: 99 mmol/L (ref 98–111)
Creatinine, Ser: 0.5 mg/dL (ref 0.44–1.00)
GFR calc Af Amer: >60 mL/min (ref >60)
GFR calc non Af Amer: >60 mL/min (ref >60)
Glucose, Bld: 96 mg/dL (ref 70–99)
Potassium: 4 mmol/L (ref 3.5–5.1)
Sodium: 139 mmol/L (ref 135–145)
Total Bilirubin: 0.4 mg/dL (ref 0.3–1.2)
Total Protein: 7 g/dL (ref 6.5–8.1)

## 2019-08-14 LAB — CBC
HCT: 30.2 % — ABNORMAL LOW (ref 36.0–46.0)
Hemoglobin: 9.3 g/dL — ABNORMAL LOW (ref 12.0–15.0)
MCH: 27.4 pg (ref 26.0–34.0)
MCHC: 30.8 g/dL (ref 30.0–36.0)
MCV: 89.1 fL (ref 80.0–100.0)
Platelets: 647 10*3/uL — ABNORMAL HIGH (ref 150–400)
RBC: 3.39 MIL/uL — ABNORMAL LOW (ref 3.87–5.11)
RDW: 14.5 % (ref 11.5–15.5)
WBC: 7 10*3/uL (ref 4.0–10.5)
nRBC: 0 % (ref 0.0–0.2)

## 2019-08-14 MED ORDER — VANCOMYCIN HCL 1250 MG/250ML IV SOLN
1250.0000 mg | Freq: Three times a day (TID) | INTRAVENOUS | Status: DC
  Administered 2019-08-15 – 2019-08-16 (×5): 1250 mg via INTRAVENOUS
  Filled 2019-08-14 (×6): qty 250

## 2019-08-14 MED ORDER — HYDROMORPHONE HCL 1 MG/ML IJ SOLN
1.0000 mg | INTRAMUSCULAR | Status: DC | PRN
  Administered 2019-08-14 – 2019-08-16 (×11): 1 mg via INTRAVENOUS
  Filled 2019-08-14 (×11): qty 1

## 2019-08-14 MED ORDER — VANCOMYCIN HCL 1500 MG/300ML IV SOLN
1500.0000 mg | Freq: Once | INTRAVENOUS | Status: AC
  Administered 2019-08-14: 1500 mg via INTRAVENOUS
  Filled 2019-08-14: qty 300

## 2019-08-14 MED ORDER — HYDROMORPHONE HCL 1 MG/ML IJ SOLN: 1.0000 mg | INTRAMUSCULAR | Status: DC | PRN

## 2019-08-14 NOTE — Progress Notes (Signed)
PT is refusing MRI after multiple attempts to get PT down to MRI.

## 2019-08-14 NOTE — Progress Notes (Signed)
Referring Physician(s): Dr Aileen Fass  Supervising Physician: Arne Cleveland  Patient Status:  [facility] - In-pt  Chief Complaint:  Right psoas abscess drain placed 08/12/19  Subjective:  Lying in bed Hip pain Painful to roll to left side Rt psoas abscess drain intact OP brown thick fluid (OP not recorded)  Allergies: Patient has no known allergies.  Medications: Prior to Admission medications   Medication Sig Start Date End Date Taking? Authorizing Provider  acetaminophen (TYLENOL) 325 MG tablet Take 2 tablets (650 mg total) by mouth every 6 (six) hours as needed for fever. 08/03/19  Yes Nolberto Hanlon, MD  ibuprofen (ADVIL) 200 MG tablet Take 200 mg by mouth every 6 (six) hours as needed for moderate pain.   Yes [provider]  ceFEPIme 2 g in sodium chloride 0.9 % 100 mL Inject 2 g into the vein every 8 (eight) hours. Patient not taking: Reported on 08/03/2019 08/03/19   Nolberto Hanlon, MD  Chlorhexidine Gluconate Cloth 2 % PADS Apply 6 each topically daily. Patient not taking: Reported on 08/03/2019 08/03/19   Nolberto Hanlon, MD  clindamycin (CLEOCIN) 900 MG/50ML IVPB Inject 50 mLs (900 mg total) into the vein every 8 (eight) hours. Patient not taking: Reported on 08/03/2019 08/03/19   Nolberto Hanlon, MD  Dextrose-Sodium Chloride (DEXTROSE 5 % AND 0.45% NACL) infusion Inject 150 mL/hr into the vein continuous. Patient not taking: Reported on 08/03/2019 08/03/19   Nolberto Hanlon, MD  enoxaparin (LOVENOX) 40 MG/0.4ML injection Inject 0.4 mLs (40 mg total) into the skin daily. Patient not taking: Reported on 08/03/2019 08/03/19   Nolberto Hanlon, MD  norepinephrine (LEVOPHED) 4-5 MG/250ML-% SOLN Inject 0-40 mcg/min into the vein continuous. Patient not taking: Reported on 08/03/2019 08/03/19   Nolberto Hanlon, MD  oxyCODONE (OXY IR/ROXICODONE) 5 MG immediate release tablet Take 1 tablet (5 mg total) by mouth every 6 (six) hours as needed for breakthrough pain. Patient not taking: Reported on 08/03/2019  08/03/19   Nolberto Hanlon, MD  potassium chloride 10 MEQ/100ML Inject 100 mLs (10 mEq total) into the vein every 1 hour x 6 doses. Patient not taking: Reported on 08/03/2019 08/03/19   Nolberto Hanlon, MD  vancomycin (VANCOREADY) 750 MG/150ML SOLN Inject 150 mLs (750 mg total) into the vein every 8 (eight) hours. Patient not taking: Reported on 08/03/2019 08/03/19   Nolberto Hanlon, MD     Vital Signs: BP 101/72 (BP Location: Right Arm)   Pulse (!) 104   Temp 98.6 F (37 C) (Oral)   Resp 18   Ht 5\' 1"  (1.549 m)   Wt 145 lb 11.6 oz (66.1 kg)   LMP  (LMP Unknown)   SpO2 98%   BMI 27.53 kg/m   Physical Exam Vitals reviewed.  Skin:    General: Skin is warm and dry.     Comments: Site is clean and dry Tender to touch No bleeding OP 20-25 cc in JP Brown thick fluid No record of OP in chart  Culture FEW STAPHYLOCOCCUS AUREUS  SUSCEPTIBILITIES TO FOLLOW  NO ANAEROBES ISOLATED;    Neurological:     Mental Status: She is alert.     Imaging: CT CHEST W CONTRAST  Result Date: 08/10/2019 CLINICAL DATA:  Right ileo psoas abscess.  Empyema. EXAM: CT CHEST, ABDOMEN, AND PELVIS WITH CONTRAST TECHNIQUE: Multidetector CT imaging of the chest, abdomen and pelvis was performed following the standard protocol during bolus administration of intravenous contrast. CONTRAST:  112mL OMNIPAQUE IOHEXOL 300 MG/ML  SOLN 80 mL  OMNIPAQUE IOHEXOL 300 MG/ML  SOLN COMPARISON:  None. August 02, 2019.  August 08, 2019. FINDINGS: CT ABDOMEN PELVIS FINDINGS Hepatobiliary: No focal liver abnormality is seen. No gallstones, gallbladder wall thickening, or biliary dilatation. Pancreas: Unremarkable. No pancreatic ductal dilatation or surrounding inflammatory changes. Spleen: Normal in size without focal abnormality. Adrenals/Urinary Tract: Adrenal glands are unremarkable. Kidneys are normal, without renal calculi, focal lesion, or hydronephrosis. Bladder is unremarkable. Stomach/Bowel: Stomach is within normal limits. Appendix appears  normal. No evidence of bowel wall thickening, distention, or inflammatory changes. Vascular/Lymphatic: No significant vascular findings are present. No enlarged abdominal or pelvic lymph nodes. Reproductive: Intrauterine device is noted. No adnexal abnormality is noted. Other: No abdominal wall hernia or abnormality. No abdominopelvic ascites. Musculoskeletal: There is significant enlargement of the right ileo psoas muscle with multiple irregular peripherally enhancing low densities throughout its course concerning for multiple abscesses. This is seen as far distally as its insertion with the proximal femur. There is also noted a 3.0 x 2.4 cm fluid collection seen in the gluteus medius muscle on the right. 20 x 15 mm fluid collection is noted in right iliacus muscle. Chest: Cardiovascular: No significant vascular findings. Normal heart size. Small pericardial effusion. Mediastinum/Nodes: No enlarged mediastinal, hilar, or axillary lymph nodes. Thyroid gland, trachea, and esophagus demonstrate no significant findings. Lungs/Pleura: Grossly stable appearance of probable small loculated pleural effusion seen along the lateral and anterior aspect of the right upper lobe. It currently measures 8.6 x 2.7 cm. No pneumothorax is noted. Right posterior atelectasis or infiltrate is noted. Mild left basilar atelectasis is noted. Stable small bilateral lung opacities are noted, most likely infectious in etiology. IMPRESSION: Grossly stable loculated effusion or empyema is seen involving the anterior and lateral portion of the right upper lobe. Grossly stable small bilateral lung opacities are noted, most likely infectious in etiology. Right posterior atelectasis or infiltrate is noted. Continued significant enlargement of right ileo psoas muscle with multiple irregular and peripherally enhancing low densities throughout its course, most consistent with abscesses. Probable abscesses are also noted in the right iliacus muscle as  well as in the right gluteus medius muscle. Electronically Signed   By: Marijo Conception M.D.   On: 08/10/2019 13:28   CT ABDOMEN PELVIS W CONTRAST  Result Date: 08/10/2019 CLINICAL DATA:  Right ileo psoas abscess.  Empyema. EXAM: CT CHEST, ABDOMEN, AND PELVIS WITH CONTRAST TECHNIQUE: Multidetector CT imaging of the chest, abdomen and pelvis was performed following the standard protocol during bolus administration of intravenous contrast. CONTRAST:  185mL OMNIPAQUE IOHEXOL 300 MG/ML  SOLN 80 mL OMNIPAQUE IOHEXOL 300 MG/ML  SOLN COMPARISON:  None. August 02, 2019.  August 08, 2019. FINDINGS: CT ABDOMEN PELVIS FINDINGS Hepatobiliary: No focal liver abnormality is seen. No gallstones, gallbladder wall thickening, or biliary dilatation. Pancreas: Unremarkable. No pancreatic ductal dilatation or surrounding inflammatory changes. Spleen: Normal in size without focal abnormality. Adrenals/Urinary Tract: Adrenal glands are unremarkable. Kidneys are normal, without renal calculi, focal lesion, or hydronephrosis. Bladder is unremarkable. Stomach/Bowel: Stomach is within normal limits. Appendix appears normal. No evidence of bowel wall thickening, distention, or inflammatory changes. Vascular/Lymphatic: No significant vascular findings are present. No enlarged abdominal or pelvic lymph nodes. Reproductive: Intrauterine device is noted. No adnexal abnormality is noted. Other: No abdominal wall hernia or abnormality. No abdominopelvic ascites. Musculoskeletal: There is significant enlargement of the right ileo psoas muscle with multiple irregular peripherally enhancing low densities throughout its course concerning for multiple abscesses. This is seen as far distally as  its insertion with the proximal femur. There is also noted a 3.0 x 2.4 cm fluid collection seen in the gluteus medius muscle on the right. 20 x 15 mm fluid collection is noted in right iliacus muscle. Chest: Cardiovascular: No significant vascular findings. Normal  heart size. Small pericardial effusion. Mediastinum/Nodes: No enlarged mediastinal, hilar, or axillary lymph nodes. Thyroid gland, trachea, and esophagus demonstrate no significant findings. Lungs/Pleura: Grossly stable appearance of probable small loculated pleural effusion seen along the lateral and anterior aspect of the right upper lobe. It currently measures 8.6 x 2.7 cm. No pneumothorax is noted. Right posterior atelectasis or infiltrate is noted. Mild left basilar atelectasis is noted. Stable small bilateral lung opacities are noted, most likely infectious in etiology. IMPRESSION: Grossly stable loculated effusion or empyema is seen involving the anterior and lateral portion of the right upper lobe. Grossly stable small bilateral lung opacities are noted, most likely infectious in etiology. Right posterior atelectasis or infiltrate is noted. Continued significant enlargement of right ileo psoas muscle with multiple irregular and peripherally enhancing low densities throughout its course, most consistent with abscesses. Probable abscesses are also noted in the right iliacus muscle as well as in the right gluteus medius muscle. Electronically Signed   By: Marijo Conception M.D.   On: 08/10/2019 13:28   CT ASPIRATION  Result Date: 08/11/2019 INDICATION: IV drug use, complex loculated right psoas abscess and small right gluteal abscess EXAM: CT GUIDED DRAINAGE OF RIGHT PSOAS ABSCESS CT ASPIRATION SMALL RIGHT GLUTEAL ABSCESS MEDICATIONS: The patient is currently admitted to the hospital and receiving intravenous antibiotics. The antibiotics were administered within an appropriate time frame prior to the initiation of the procedure. ANESTHESIA/SEDATION: 2.5 mg IV Versed 200 mcg IV Fentanyl Moderate Sedation Time:  35 MINUTES The patient was continuously monitored during the procedure by the interventional radiology nurse under my direct supervision. COMPLICATIONS: None immediate. TECHNIQUE: Informed written  consent was obtained from the patient after a thorough discussion of the procedural risks, benefits and alternatives. All questions were addressed. Maximal Sterile Barrier Technique was utilized including caps, mask, sterile gowns, sterile gloves, sterile drape, hand hygiene and skin antiseptic. A timeout was performed prior to the initiation of the procedure. PROCEDURE: Previous imaging reviewed. Patient position prone. Noncontrast localization CT performed. The right psoas abscess was localized and marked for a posterior paraspinous approach. The right gluteal abscess was also marked for a posterior approach. Right psoas abscess drain: Under sterile conditions and local anesthesia, an 18 gauge 15 cm access was advanced to the right psoas fluid collection. Needle position confirmed with CT. Syringe aspiration yielded thick purulent fluid. Guidewire inserted followed by tract dilatation insert a 12 French drain. Drain catheter position confirmed with CT. Aspiration yielded a total of 15 cc thick purulent fluid. Culture sent for sample. Catheter secured with Prolene suture. Drainage catheter flushed with saline and connected to external suction bulb. Sterile dressing applied. No immediate complication. Patient tolerated the procedure well. Right gluteal abscess aspiration: In a similar fashion, the right gluteal area was sterilely prepped and draped. Under sterile conditions and local anesthesia, CT guidance utilized to advance an 18 gauge 10 cm access needle into the right gluteal intramuscular fluid collection. Needle position confirmed with CT. Syringe aspiration yielded similar thick purulent fluid approximately 8 cc. Sample sent for culture. Needle removed. No immediate complication. Patient tolerated the procedure well. FINDINGS: CT imaging confirms needle placed in the right psoas abscess for drain placement. CT imaging confirms needle placement in the  right gluteal abscess for aspiration. IMPRESSION:  Successful CT-guided right psoas abscess drain placement Successful CT-guided right gluteal abscess aspiration. Cultures sent for both sites. Electronically Signed   By: Jerilynn Mages.  Shick M.D.   On: 08/11/2019 10:59   CT IMAGE GUIDED DRAINAGE BY PERCUTANEOUS CATHETER  Result Date: 08/11/2019 INDICATION: IV drug use, complex loculated right psoas abscess and small right gluteal abscess EXAM: CT GUIDED DRAINAGE OF RIGHT PSOAS ABSCESS CT ASPIRATION SMALL RIGHT GLUTEAL ABSCESS MEDICATIONS: The patient is currently admitted to the hospital and receiving intravenous antibiotics. The antibiotics were administered within an appropriate time frame prior to the initiation of the procedure. ANESTHESIA/SEDATION: 2.5 mg IV Versed 200 mcg IV Fentanyl Moderate Sedation Time:  35 MINUTES The patient was continuously monitored during the procedure by the interventional radiology nurse under my direct supervision. COMPLICATIONS: None immediate. TECHNIQUE: Informed written consent was obtained from the patient after a thorough discussion of the procedural risks, benefits and alternatives. All questions were addressed. Maximal Sterile Barrier Technique was utilized including caps, mask, sterile gowns, sterile gloves, sterile drape, hand hygiene and skin antiseptic. A timeout was performed prior to the initiation of the procedure. PROCEDURE: Previous imaging reviewed. Patient position prone. Noncontrast localization CT performed. The right psoas abscess was localized and marked for a posterior paraspinous approach. The right gluteal abscess was also marked for a posterior approach. Right psoas abscess drain: Under sterile conditions and local anesthesia, an 18 gauge 15 cm access was advanced to the right psoas fluid collection. Needle position confirmed with CT. Syringe aspiration yielded thick purulent fluid. Guidewire inserted followed by tract dilatation insert a 12 French drain. Drain catheter position confirmed with CT. Aspiration  yielded a total of 15 cc thick purulent fluid. Culture sent for sample. Catheter secured with Prolene suture. Drainage catheter flushed with saline and connected to external suction bulb. Sterile dressing applied. No immediate complication. Patient tolerated the procedure well. Right gluteal abscess aspiration: In a similar fashion, the right gluteal area was sterilely prepped and draped. Under sterile conditions and local anesthesia, CT guidance utilized to advance an 18 gauge 10 cm access needle into the right gluteal intramuscular fluid collection. Needle position confirmed with CT. Syringe aspiration yielded similar thick purulent fluid approximately 8 cc. Sample sent for culture. Needle removed. No immediate complication. Patient tolerated the procedure well. FINDINGS: CT imaging confirms needle placed in the right psoas abscess for drain placement. CT imaging confirms needle placement in the right gluteal abscess for aspiration. IMPRESSION: Successful CT-guided right psoas abscess drain placement Successful CT-guided right gluteal abscess aspiration. Cultures sent for both sites. Electronically Signed   By: Jerilynn Mages.  Shick M.D.   On: 08/11/2019 10:59    Labs:  CBC: Recent Labs    08/11/19 0427 08/12/19 1317 08/13/19 0535 08/14/19 0647  WBC 10.7* 9.4 7.7 7.0  HGB 9.8* 9.5* 9.1* 9.3*  HCT 30.9* 29.3* 29.5* 30.2*  PLT 598* 617* 621* 647*    COAGS: Recent Labs    08/03/19 0250 08/11/19 0427  INR 1.3* 1.3*    BMP: Recent Labs    08/11/19 0427 08/12/19 1317 08/13/19 0535 08/14/19 0647  NA 136 137 137 139  K 3.8 3.8 4.0 4.0  CL 97* 100 103 99  CO2 26 26 26 26   GLUCOSE 165* 116* 124* 96  BUN 7 5* 7 5*  CALCIUM 8.6* 8.2* 8.7* 9.0  CREATININE 0.46 0.46 0.45 0.50  GFRNONAA >60 >60 >60 >60  GFRAA >60 >60 >60 >60    LIVER FUNCTION  TESTS: Recent Labs    08/11/19 0427 08/12/19 1317 08/13/19 0535 08/14/19 0647  BILITOT 0.3 0.2* 0.3 0.4  AST 54* 25 56* 43*  ALT 74* 45* 69* 74*    ALKPHOS 233* 183* 164* 149*  PROT 6.8 6.9 6.6 7.0  ALBUMIN 2.0* 2.1* 2.0* 2.0*    Assessment and Plan:  Rt psoas abscess drain intact Will follow Orders in for flush and record OP daily   Electronically Signed: Lavonia Drafts, PA-C 08/14/2019, 10:45 AM   I spent a total of 15 Minutes at the the patient's bedside AND on the patient's hospital floor or unit, greater than 50% of which was counseling/coordinating care for Rt psoas abscess drain

## 2019-08-14 NOTE — Progress Notes (Signed)
[facility] for Infectious Disease  Date of Admission:  08/03/2019     Total days of antibiotics 13         ASSESSMENT:  Leslie Molina has remained afebrile over the past 24 hours s/p IR drain placement for psoas abscess and aspiration of gluteal abscess. Drainage appears to be slowing but remains purulent. Will change Teflaro and Daptomycin back to Vancomycin. Will need to continue to watch vancomycin levels and renal function for therapeutic drug monitoring. While improved, she still has a long course of therapy to recover from this severe disseminated infection. Continue pain management per primary team.   PLAN:  1. Change Teflaro and Daptomycin to vancomycin.  2. Monitor renal function and vancomycin levels for therapeutic monitoring.  3. Drain management per IR 4. Pain management per primary team.   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 Chlorhexidine Gluconate Cloth  6 each Topical Daily  . docusate sodium  100 mg Oral BID  . DULoxetine  30 mg Oral Daily  . enoxaparin (LOVENOX) injection  40 mg Subcutaneous QHS  . feeding supplement (ENSURE ENLIVE)  237 mL Oral BID BM  . gabapentin  300 mg Oral TID  . polyethylene glycol  17 g Oral Daily  . QUEtiapine  150 mg Oral QHS  . sodium chloride flush  10-40 mL Intracatheter Q12H  . traZODone  150 mg Oral QHS    SUBJECTIVE:  Afebrile overnight with continued pain in her right hip. Awaiting pain medication.   No Known Allergies   Review of Systems: Review of Systems  Constitutional: Negative for chills, fever and weight loss.  Respiratory: Negative for cough, shortness of breath and wheezing.   Cardiovascular: Negative for chest pain and leg swelling.  Gastrointestinal: Negative for abdominal pain,  constipation, diarrhea, nausea and vomiting.  Musculoskeletal:       Positive for right hip pain.   Skin: Negative for rash.      OBJECTIVE: Vitals:   08/13/19 1819 08/13/19 2132 08/14/19 0639 08/14/19 0900  BP: 100/72 103/66 96/60 101/72  Pulse: 98 92 (!) 104   Resp:    18  Temp:  98.5 F (36.9 C) 98.6 F (37 C) 98.6 F (37 C)  TempSrc:  Oral Oral Oral  SpO2: 100% 100% 99% 98%  Weight:   66.1 kg   Height:       Body mass index is 27.53 kg/m.  Physical Exam Constitutional:      General: She is not in acute distress.    Appearance: She is well-developed.     Comments: Lying in bed with head of bed elevated. Pleasant.   Cardiovascular:     Rate and Rhythm: Normal rate and regular rhythm.     Heart sounds: Normal heart sounds.     Comments: PICC line in left upper extremity with clean and dry dressing and without evidence of infection.  Pulmonary:     Effort: Pulmonary effort is normal.     Breath sounds: Normal breath sounds.  Musculoskeletal:     Comments: JP drain with small amount of purulent drainage.   Skin:    General: Skin is warm and dry.  Neurological:     Mental  Status: She is alert and oriented to person, place, and time.  Psychiatric:        Behavior: Behavior normal.        Thought Content: Thought content normal.        Judgment: Judgment normal.     Lab Results Lab Results  Component Value Date   WBC 7.0 08/14/2019   HGB 9.3 (L) 08/14/2019   HCT 30.2 (L) 08/14/2019   MCV 89.1 08/14/2019   PLT 647 (H) 08/14/2019    Lab Results  Component Value Date   CREATININE 0.50 08/14/2019   BUN 5 (L) 08/14/2019   NA 139 08/14/2019   K 4.0 08/14/2019   CL 99 08/14/2019   CO2 26 08/14/2019    Lab Results  Component Value Date   ALT 74 (H) 08/14/2019   AST 43 (H) 08/14/2019   ALKPHOS 149 (H) 08/14/2019   BILITOT 0.4 08/14/2019     Microbiology: Recent Results (from the past 240 hour(s))  Culture, blood (Routine X 2) w Reflex to ID Panel      Status: None   Collection Time: 08/05/19  7:18 AM   Specimen: BLOOD  Result Value Ref Range Status   Specimen Description BLOOD LEFT ANTECUBITAL  Final   Special Requests   Final    BOTTLES DRAWN AEROBIC AND ANAEROBIC Blood Culture adequate volume   Culture   Final    NO GROWTH 5 DAYS Performed at [facility], [address] [place], [place] [address]    Report Status 08/10/2019 FINAL  Final  Culture, blood (Routine X 2) w Reflex to ID Panel     Status: None   Collection Time: 08/05/19  7:19 AM   Specimen: BLOOD LEFT HAND  Result Value Ref Range Status   Specimen Description BLOOD LEFT HAND  Final   Special Requests   Final    BOTTLES DRAWN AEROBIC AND ANAEROBIC Blood Culture adequate volume   Culture   Final    NO GROWTH 5 DAYS Performed at [facility], [place] [address], [place], [place] [address]    Report Status 08/10/2019 FINAL  Final  Body fluid culture     Status: None   Collection Time: 08/09/19  4:54 PM   Specimen: Body Fluid  Result Value Ref Range Status   Specimen Description FLUID PLEURAL RIGHT  Final   Special Requests NONE  Final   Gram Stain   Final    ABUNDANT WBC PRESENT, PREDOMINANTLY PMN MODERATE GRAM POSITIVE COCCI Performed at [facility], [address] [place], [place] [address]    Culture FEW METHICILLIN RESISTANT STAPHYLOCOCCUS AUREUS  Final   Report Status 08/12/2019 FINAL  Final   Organism ID, Bacteria METHICILLIN RESISTANT STAPHYLOCOCCUS AUREUS  Final      Susceptibility   Methicillin resistant staphylococcus aureus - MIC*    CIPROFLOXACIN >=8 RESISTANT Resistant     ERYTHROMYCIN >=8 RESISTANT Resistant     GENTAMICIN <=0.5 SENSITIVE Sensitive     OXACILLIN >=4 RESISTANT Resistant     TETRACYCLINE <=1 SENSITIVE Sensitive     VANCOMYCIN 1 SENSITIVE Sensitive     TRIMETH/SULFA <=10 SENSITIVE Sensitive     CLINDAMYCIN <=0.25 SENSITIVE Sensitive     RIFAMPIN <=0.5 SENSITIVE Sensitive     Inducible Clindamycin NEGATIVE  Sensitive     * FEW METHICILLIN RESISTANT STAPHYLOCOCCUS AUREUS  Aerobic/Anaerobic Culture (surgical/deep wound)     Status: None (Preliminary result)   Collection Time: 08/11/19 10:35 AM  Specimen: Abscess  Result Value Ref Range Status   Specimen Description ABSCESS RIGHT  Final   Special Requests Normal  Final   Gram Stain   Final    ABUNDANT WBC PRESENT, PREDOMINANTLY PMN ABUNDANT GRAM POSITIVE COCCI Performed at [facility], [place] [address] [place], [place] [address]    Culture   Final    ABUNDANT STAPHYLOCOCCUS AUREUS SUSCEPTIBILITIES TO FOLLOW NO ANAEROBES ISOLATED; CULTURE IN PROGRESS FOR 5 DAYS    Report Status PENDING  Incomplete  Aerobic/Anaerobic Culture (surgical/deep wound)     Status: None (Preliminary result)   Collection Time: 08/11/19 10:38 AM   Specimen: Abscess  Result Value Ref Range Status   Specimen Description ABSCESS RIGHT GLUTEAL  Final   Special Requests Normal  Final   Gram Stain   Final    FEW WBC PRESENT, PREDOMINANTLY PMN NO ORGANISMS SEEN Performed at [facility], [address] [place], [place] [address]    Culture   Final    FEW STAPHYLOCOCCUS AUREUS SUSCEPTIBILITIES TO FOLLOW NO ANAEROBES ISOLATED; CULTURE IN PROGRESS FOR 5 DAYS    Report Status PENDING  Incomplete     Terri Piedra, NP [facility]   08/14/2019  11:31 AM

## 2019-08-14 NOTE — Progress Notes (Signed)
[facility] PROGRESS NOTE    Progress Note  Leslie Molina  [id number] DOB: [birth year] DOA: 08/03/2019 PCP: [facility] [place]     Brief Narrative:   Leslie Molina is an 34 y.o. 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  The abscess extends to the posterior aspect of the proximal right thigh.  Culture data: COVID 3/3 >negative  MRSA PCR 3/3 >Positive  Blood culture 3/3 >MRSA  Pelvic abcess culture 3/3 >Abundant Staph aures, full result pending.  Antibiotic regimen: Daptomycin 3/3 once Cefepime 3/3-3/4 Clindamycin 3/3 once Vancomycin 3/3>>  Procedures: MRI of the right hip on 08/02/2019: Extensive right iliopsoas abscess extending from at least the L3-4 level to the insertion of the iliopsoas tendon on the lesser trochanter. 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  Myositis of the right gluteus minimus, medius, and maximus and right piriformis muscles. 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  Abscess extends through the posterior aspect of the right SI joint into the gluteal musculature. Fluid along the fascial planes in the anterior aspect of the proximal right thigh likely representing infectious fasciitis.  MRI of the lumbar spine on 08/03/2019: Septic right sacroiliac joint with associated osteomyelitis of the right sacral ala.  Extensive right iliopsoas abscess.  Edema and enhancement of the right gluteal muscles and right ultifidus muscle at the L5-S1 level consistent with myositis. No evidence of epidural extension of abscess at this time.  CT of the neck on 08/06/2019 showed multiple peripheral predominant nodules at  the lung apices suspicious for septic emboli and a right empyema with inflammation and stranding on the right retroclavicular region and right lower neck consistent with soft tissue infection.  IR was consulted who performe ultrasound-guided thoracentesis on 08/09/2019, aspirated 14 mL of purulent material.  Culture grew MRSA.  CT of the abdomen and pelvis 08/10/2019 showed Continued significant enlargement of right ileo psoas muscle with multiple irregular and peripherally enhancing low densities throughout its course, most consistent with abscesses. Probable abscesses are also noted in the right iliacus muscle as well as in the right gluteus medius muscle.  IR was reconsulted to perform gluteal abscess and right iliopsoas abscess needle aspiration on 08/11/2019 see below for further details.     Assessment/Plan:   Sepsis secondary to MRSA bacteremia and iliopsoas abscess/osteomyelitis of the sacrum/myositis and empyema: Cardiothoracic surgery was consulted who recommended drainage of the empyema. Blood cultures from 08/03/2019 show staph aureus repeated blood cultures from 08/05/2019 showed no growth over 20. She now has remained afebrile continue IV empiric antibiotics. Patient defervesced her leukocytosis improved, she has refused an MRI of the hip for the past 2 days, but she is now complaining of right hip pain today.  She has agreed to get the MRI tomorrow on 08/15/2019.  Iliopsoas/gluteal abscess/osteomyelitis of the sacrum and possible infection of the right dorsum hand and right sternoclavicular joint infection: MRI of the hip and lumbar spine as below. She is currently on IV Dilaudid and oral oxycodone. IR consulted with drain placement of gluteal abscess and CT-guided right gluteal cyst aspiration performed on 3.12.2021.  Culture from both sides was sent no growth till date. Patient  has refused MRI of the right hip for the last 2 days, she has agreed for MRI today.  Peritonitis/IUD  perforation: CT scan of the abdomen her abdominal exam is benign. Gynecology and general surgery was consulted and they have signed off. They recommended no surgical intervention at this time, no surgical intervention at this time.  Hypokalemia: Repleted orally now resolved.  Bipolar disorder/anxiety/depression: Home medications Cymbalta, Seroquel trazodone were held.  Narcotic seeking behavior: Patient is in a bad mood as her narcotics were decreased yesterday as she was sleepy and not able to carry on a conversation, she was groggy and did not have her breakfast yesterday until after 10 AM.  After this she has been complaining and arguing as she wants her narcotics increase. She threatened me today saying that her mom was coming over today to speak with [place].   DVT prophylaxis: heparin Family Communication:none Disposition Plan/Barrier to D/C: She will probably be discharged when she has completed her course of IV antibiotic she is not a candidate to go home with a PICC line and 6 weeks of IV antibiotics.  Her start date is 08/05/2019 she will need to continue antibiotics for 6 weeks she will need to finish her course in house.  Code Status:     Code Status Orders  (From admission, onward)         Start     Ordered   08/03/19 1729  Full code  Continuous     08/03/19 1729        Code Status History    Date Active Date Inactive Code Status Order ID Comments User Context   08/02/2019 1859 08/03/2019 1708 Full Code [id number]  Collier Bullock, MD ED   Advance Care Planning Activity        IV Access:    Peripheral IV   Procedures and diagnostic studies:   No results found.   Medical Consultants:    None.  Anti-Infectives:   IV vancomycin  Subjective:    Leslie Molina complaining of right hip pain today, she ate about 50% of her food.  Objective:    Vitals:   08/13/19 1819 08/13/19 2132 08/14/19 0639 08/14/19 0900  BP: 100/72 103/66 96/60 101/72  Pulse: 98  92 (!) 104   Resp:    18  Temp:  98.5 F (36.9 C) 98.6 F (37 C) 98.6 F (37 C)  TempSrc:  Oral Oral Oral  SpO2: 100% 100% 99% 98%  Weight:   66.1 kg   Height:       SpO2: 98 % O2 Flow Rate (L/min): 2 L/min   Intake/Output Summary (Last 24 hours) at 08/14/2019 0951 Last data filed at 08/14/2019 0900 Gross per 24 hour  Intake 3009 ml  Output 1500 ml  Net 1509 ml   Filed Weights   08/12/19 0416 08/13/19 0347 08/14/19 0639  Weight: 68.4 kg 66.7 kg 66.1 kg    Exam: General exam: In no acute distress. Respiratory system: Good air movement and clear to auscultation. Cardiovascular system: S1 & S2 heard, RRR. No JVD. Gastrointestinal system: Abdomen is nondistended, soft and nontender.  Central nervous system: Alert and oriented.  Extremities: Pain with external rotation of the right hip Skin: No rashes, lesions or ulcers  Data Reviewed:    Labs: Basic Metabolic Panel: Recent Labs  Lab 08/08/19 0339 08/08/19 0339 08/10/19 0500 08/11/19 0427 08/11/19 0427 08/12/19 1317 08/12/19 1317 08/13/19 0535 08/14/19 0647  NA 135  --   --  136  --  137  --  137 139  K 4.1   < >  --  3.8   < > 3.8   < > 4.0 4.0  CL 102  --   --  97*  --  100  --  103 99  CO2 24  --   --  26  --  26  --  26 26  GLUCOSE 148*  --   --  165*  --  116*  --  124* 96  BUN 7  --   --  7  --  5*  --  7 5*  CREATININE 0.45   < > 0.50 0.46  --  0.46  --  0.45 0.50  CALCIUM 8.0*  --   --  8.6*  --  8.2*  --  8.7* 9.0  MG 2.0  --   --   --   --   --   --   --   --    < > = values in this interval not displayed.   GFR Estimated Creatinine Clearance: 86.2 mL/min (by C-G formula based on SCr of 0.5 mg/dL). Liver Function Tests: Recent Labs  Lab 08/08/19 0339 08/11/19 0427 08/12/19 1317 08/13/19 0535 08/14/19 0647  AST 35 54* 25 56* 43*  ALT 53* 74* 45* 69* 74*  ALKPHOS 122 233* 183* 164* 149*  BILITOT 0.3 0.3 0.2* 0.3 0.4  PROT 5.7* 6.8 6.9 6.6 7.0  ALBUMIN 1.7* 2.0* 2.1* 2.0* 2.0*   No  results for input(s): LIPASE, AMYLASE in the last 168 hours. No results for input(s): AMMONIA in the last 168 hours. Coagulation profile Recent Labs  Lab 08/11/19 0427  INR 1.3*   COVID-19 Labs  No results for input(s): DDIMER, FERRITIN, LDH, CRP in the last 72 hours.  Lab Results  Component Value Date   SARSCOV2NAA NEGATIVE 08/02/2019   [place] NEGATIVE 07/29/2019    CBC: Recent Labs  Lab 08/08/19 0339 08/11/19 0427 08/12/19 1317 08/13/19 0535 08/14/19 0647  WBC 11.9* 10.7* 9.4 7.7 7.0  NEUTROABS  --  8.3*  --   --   --   HGB 10.0* 9.8* 9.5* 9.1* 9.3*  HCT 30.8* 30.9* 29.3* 29.5* 30.2*  MCV 86.5 86.6 84.9 87.3 89.1  PLT 464* 598* 617* 621* 647*   Cardiac Enzymes: Recent Labs  Lab 08/10/19 1944  CKTOTAL 37*   BNP (last 3 results) No results for input(s): PROBNP in the last 8760 hours. CBG: No results for input(s): GLUCAP in the last 168 hours. D-Dimer: No results for input(s): DDIMER in the last 72 hours. Hgb A1c: No results for input(s): HGBA1C in the last 72 hours. Lipid Profile: No results for input(s): CHOL, HDL, LDLCALC, TRIG, CHOLHDL, LDLDIRECT in the last 72 hours. Thyroid function studies: No results for input(s): TSH, T4TOTAL, T3FREE, THYROIDAB in the last 72 hours.  Invalid input(s): FREET3 Anemia work up: No results for input(s): VITAMINB12, FOLATE, FERRITIN, TIBC, IRON, RETICCTPCT in the last 72 hours. Sepsis Labs: Recent Labs  Lab 08/11/19 0427 08/12/19 1317 08/13/19 0535 08/14/19 0647  WBC 10.7* 9.4 7.7 7.0   Microbiology Recent Results (from the past 240 hour(s))  Culture, blood (Routine X 2) w Reflex to ID Panel     Status: None   Collection Time: 08/05/19  7:18 AM   Specimen: BLOOD  Result Value Ref Range Status   Specimen Description BLOOD LEFT ANTECUBITAL  Final   Special Requests   Final    BOTTLES DRAWN AEROBIC AND ANAEROBIC  Blood Culture adequate volume   Culture   Final    NO GROWTH 5 DAYS Performed at [facility], [place] [address] [place], [place] [address]    Report Status 08/10/2019 FINAL  Final  Culture, blood (Routine X 2) w Reflex to ID Panel     Status: None   Collection Time: 08/05/19  7:19 AM   Specimen: BLOOD LEFT HAND  Result Value Ref Range Status   Specimen Description BLOOD LEFT HAND  Final   Special Requests   Final    BOTTLES DRAWN AEROBIC AND ANAEROBIC Blood Culture adequate volume   Culture   Final    NO GROWTH 5 DAYS Performed at [facility], [place] [address] [place], [place] [address]    Report Status 08/10/2019 FINAL  Final  Body fluid culture     Status: None   Collection Time: 08/09/19  4:54 PM   Specimen: Body Fluid  Result Value Ref Range Status   Specimen Description FLUID PLEURAL RIGHT  Final   Special Requests NONE  Final   Gram Stain   Final    ABUNDANT WBC PRESENT, PREDOMINANTLY PMN MODERATE GRAM POSITIVE COCCI Performed at [facility], [address], [place], [place] [address]    Culture FEW METHICILLIN RESISTANT STAPHYLOCOCCUS AUREUS  Final   Report Status 08/12/2019 FINAL  Final   Organism ID, Bacteria METHICILLIN RESISTANT STAPHYLOCOCCUS AUREUS  Final      Susceptibility   Methicillin resistant staphylococcus aureus - MIC*    CIPROFLOXACIN >=8 RESISTANT Resistant     ERYTHROMYCIN >=8 RESISTANT Resistant     GENTAMICIN <=0.5 SENSITIVE Sensitive     OXACILLIN >=4 RESISTANT Resistant     TETRACYCLINE <=1 SENSITIVE Sensitive     VANCOMYCIN 1 SENSITIVE Sensitive     TRIMETH/SULFA <=10 SENSITIVE Sensitive     CLINDAMYCIN <=0.25 SENSITIVE Sensitive     RIFAMPIN <=0.5 SENSITIVE Sensitive     Inducible Clindamycin NEGATIVE Sensitive     * FEW METHICILLIN RESISTANT STAPHYLOCOCCUS AUREUS  Aerobic/Anaerobic Culture (surgical/deep wound)     Status: None (Preliminary result)   Collection Time: 08/11/19 10:35 AM   Specimen: Abscess  Result Value Ref Range Status   Specimen Description ABSCESS RIGHT  Final   Special Requests Normal  Final     Gram Stain   Final    ABUNDANT WBC PRESENT, PREDOMINANTLY PMN ABUNDANT GRAM POSITIVE COCCI Performed at [facility], [address], [place], [place] [address]    Culture   Final    ABUNDANT STAPHYLOCOCCUS AUREUS SUSCEPTIBILITIES TO FOLLOW NO ANAEROBES ISOLATED; CULTURE IN PROGRESS FOR 5 DAYS    Report Status PENDING  Incomplete  Aerobic/Anaerobic Culture (surgical/deep wound)     Status: None (Preliminary result)   Collection Time: 08/11/19 10:38 AM   Specimen: Abscess  Result Value Ref Range Status   Specimen Description ABSCESS RIGHT GLUTEAL  Final   Special Requests Normal  Final   Gram Stain   Final    FEW WBC PRESENT, PREDOMINANTLY PMN NO ORGANISMS SEEN Performed at [facility], [address], [place],  [address]    Culture   Final    FEW STAPHYLOCOCCUS AUREUS SUSCEPTIBILITIES TO FOLLOW NO ANAEROBES ISOLATED; CULTURE IN PROGRESS FOR 5 DAYS    Report Status PENDING  Incomplete     Medications:   . Chlorhexidine Gluconate Cloth  6 each Topical Daily  . docusate sodium  100 mg Oral BID  . DULoxetine  30 mg Oral Daily  . enoxaparin (LOVENOX) injection  40 mg Subcutaneous QHS  . feeding supplement (ENSURE ENLIVE)  237 mL Oral BID BM  . gabapentin  300 mg Oral TID  . polyethylene glycol  17 g Oral Daily  . QUEtiapine  150 mg Oral QHS  . sodium chloride flush  10-40 mL Intracatheter Q12H  . traZODone  150 mg Oral QHS   Continuous Infusions: . sodium chloride 250 mL (08/08/19 1839)  . ceFTAROline (TEFLARO) IV 600 mg (08/14/19 0425)  . DAPTOmycin (CUBICIN)  IV Stopped (08/13/19 2214)      LOS: 11 days   Leslie Molina  [facility]  08/14/2019, 9:51 AM

## 2019-08-14 NOTE — Progress Notes (Signed)
Pharmacy Antibiotic Note  Leslie Molina is a 34 y.o. female admitted on 08/03/2019 with MRSA bacteremia/abscess w/ osteo of saacrum and ischium. Patient was initially switch to combination daptomycin + ceftaroline due to noted fever. She has since been afebrile for 48 hours, so de-escalating therapy back to vancomycin. Pharmacy has been consulted for vancomycin dosing.   Patient was previously on vancomycin 750 mg q8h and was found to be subtherapeutic. She was then stable on 1500 mg q8h but eventually began to show accumulation. Deviating from vanc calculator given previous vanc history. Will plan to start conservatively and adjust as needed with early levels.   Plan: Start Vancomycin 1500 mg IV x1, then 1250 mg IV q8h.   F/u clinical improvement, renal function, and vanc levels as needed  Height: 5\' 1"  (154.9 cm) Weight: 145 lb 11.6 oz (66.1 kg) IBW/kg (Calculated) : 47.8  Temp (24hrs), Avg:98.6 F (37 C), Min:98.5 F (36.9 C), Max:98.6 F (37 C)  Recent Labs  Lab 08/08/19 0339 08/08/19 0339 08/09/19 0500 08/09/19 0959 08/10/19 0500 08/11/19 0427 08/12/19 1317 08/13/19 0535 08/14/19 0647  WBC 11.9*  --   --   --   --  10.7* 9.4 7.7 7.0  CREATININE 0.45   < >  --   --  0.50 0.46 0.46 0.45 0.50  VANCOTROUGH  --   --   --  11*  --   --   --   --   --   VANCOPEAK  --   --  30  --   --   --   --   --   --    < > = values in this interval not displayed.    Estimated Creatinine Clearance: 86.2 mL/min (by C-G formula based on SCr of 0.5 mg/dL).    No Known Allergies  Antimicrobials this admission: 3/3 metronidazole x 1 3/3 vancomycin>>3/11 3/3 cefepime>>3/5 3/4 clindamycin >>3/5 3/11 Ceftaroline>>3/15 3/4 daptomycin 500mg  x 1; resume 3/11 >> 3/15  3/15 vancomycin >>  Microbiology results: 3/4 repeats: staph aureus 3/3BCx:GPC clusters 4/4 bottles (BCID = MRSA) 3/3 iliopsoas abscess:  MRSA  3/6 blood x2- neg 3/10 pleural fluid- staph aureus 3/12 wound culture - staph  aureus   Thank you for allowing pharmacy to be a part of this patient's care.  Claudina Lick, PharmD Candidate  08/14/2019 11:10 AM

## 2019-08-15 DIAGNOSIS — M25551 Pain in right hip: Secondary | ICD-10-CM

## 2019-08-15 LAB — BASIC METABOLIC PANEL
Anion gap: 12 (ref 5–15)
BUN: 8 mg/dL (ref 6–20)
CO2: 28 mmol/L (ref 22–32)
Calcium: 8.9 mg/dL (ref 8.9–10.3)
Chloride: 95 mmol/L — ABNORMAL LOW (ref 98–111)
Creatinine, Ser: 0.67 mg/dL (ref 0.44–1.00)
GFR calc Af Amer: >60 mL/min (ref >60)
GFR calc non Af Amer: >60 mL/min (ref >60)
Glucose, Bld: 105 mg/dL — ABNORMAL HIGH (ref 70–99)
Potassium: 4.3 mmol/L (ref 3.5–5.1)
Sodium: 135 mmol/L (ref 135–145)

## 2019-08-15 MED ORDER — HYDROMORPHONE HCL 1 MG/ML IJ SOLN
1.0000 mg | Freq: Once | INTRAMUSCULAR | Status: AC
  Administered 2019-08-16: 1 mg via INTRAVENOUS
  Filled 2019-08-15 (×2): qty 1

## 2019-08-15 NOTE — Progress Notes (Addendum)
Referring Physician(s): Dr. Alfonse Ras  Supervising Physician: Sandi Mariscal  Patient Status:  [facility] - In-pt  Chief Complaint: Right iliopsoas abscess   Subjective:  34 y.o, female inpatient. History of IV drug use presetned to the ED with lower back pain and fever found to have right iliopsoas abscess with myositis and osteomyelitis of the right sacrum and iliac. IR placed a right psoas drain on 3.12.21. Patient alert and laying in bed, calm and comfortable. Denies any fevers, headache, chest pain, SOB, cough, abdominal pain, nausea, vomiting or bleeding. Patient endorses some tenderness at exit site with palpation.   Allergies: Patient has no known allergies.  Medications: Prior to Admission medications   Medication Sig Start Date End Date Taking? Authorizing Provider  acetaminophen (TYLENOL) 325 MG tablet Take 2 tablets (650 mg total) by mouth every 6 (six) hours as needed for fever. 08/03/19  Yes Nolberto Hanlon, MD  ibuprofen (ADVIL) 200 MG tablet Take 200 mg by mouth every 6 (six) hours as needed for moderate pain.   Yes [provider]  ceFEPIme 2 g in sodium chloride 0.9 % 100 mL Inject 2 g into the vein every 8 (eight) hours. Patient not taking: Reported on 08/03/2019 08/03/19   Nolberto Hanlon, MD  Chlorhexidine Gluconate Cloth 2 % PADS Apply 6 each topically daily. Patient not taking: Reported on 08/03/2019 08/03/19   Nolberto Hanlon, MD  clindamycin (CLEOCIN) 900 MG/50ML IVPB Inject 50 mLs (900 mg total) into the vein every 8 (eight) hours. Patient not taking: Reported on 08/03/2019 08/03/19   Nolberto Hanlon, MD  Dextrose-Sodium Chloride (DEXTROSE 5 % AND 0.45% NACL) infusion Inject 150 mL/hr into the vein continuous. Patient not taking: Reported on 08/03/2019 08/03/19   Nolberto Hanlon, MD  enoxaparin (LOVENOX) 40 MG/0.4ML injection Inject 0.4 mLs (40 mg total) into the skin daily. Patient not taking: Reported on 08/03/2019 08/03/19   Nolberto Hanlon, MD  norepinephrine (LEVOPHED) 4-5 MG/250ML-%  SOLN Inject 0-40 mcg/min into the vein continuous. Patient not taking: Reported on 08/03/2019 08/03/19   Nolberto Hanlon, MD  oxyCODONE (OXY IR/ROXICODONE) 5 MG immediate release tablet Take 1 tablet (5 mg total) by mouth every 6 (six) hours as needed for breakthrough pain. Patient not taking: Reported on 08/03/2019 08/03/19   Nolberto Hanlon, MD  potassium chloride 10 MEQ/100ML Inject 100 mLs (10 mEq total) into the vein every 1 hour x 6 doses. Patient not taking: Reported on 08/03/2019 08/03/19   Nolberto Hanlon, MD  vancomycin (VANCOREADY) 750 MG/150ML SOLN Inject 150 mLs (750 mg total) into the vein every 8 (eight) hours. Patient not taking: Reported on 08/03/2019 08/03/19   Nolberto Hanlon, MD     Vital Signs: BP 104/67 (BP Location: Right Arm)   Pulse 96   Temp 98.4 F (36.9 C) (Oral)   Resp 16   Ht 5\' 1"  (1.549 m)   Wt 146 lb 9.7 oz (66.5 kg)   LMP  (LMP Unknown)   SpO2 98%   BMI 27.70 kg/m   Physical Exam Vitals and nursing note reviewed.  Constitutional:      Appearance: She is well-developed.  HENT:     Head: Normocephalic and atraumatic.  Eyes:     Conjunctiva/sclera: Conjunctivae normal.  Pulmonary:     Effort: Pulmonary effort is normal.  Musculoskeletal:        General: Normal range of motion.     Cervical back: Normal range of motion.  Skin:    General: Skin is warm.  Comments: Right psoas drain site is unremarkable with no erythema, tenderness or drainage noted. Suture and stat lock in place. Dressing is clean dry and intact. 15 ml of  thich serosanginous purulent colored fluid noted in bulb suction device. Drain is able to be flushed.   Neurological:     Mental Status: She is alert and oriented to person, place, and time.     Imaging: No results found.  Labs:  CBC: Recent Labs    08/11/19 0427 08/12/19 1317 08/13/19 0535 08/14/19 0647  WBC 10.7* 9.4 7.7 7.0  HGB 9.8* 9.5* 9.1* 9.3*  HCT 30.9* 29.3* 29.5* 30.2*  PLT 598* 617* 621* 647*    COAGS: Recent Labs     08/03/19 0250 08/11/19 0427  INR 1.3* 1.3*    BMP: Recent Labs    08/12/19 1317 08/13/19 0535 08/14/19 0647 08/15/19 0519  NA 137 137 139 135  K 3.8 4.0 4.0 4.3  CL 100 103 99 95*  CO2 26 26 26 28   GLUCOSE 116* 124* 96 105*  BUN 5* 7 5* 8  CALCIUM 8.2* 8.7* 9.0 8.9  CREATININE 0.46 0.45 0.50 0.67  GFRNONAA >60 >60 >60 >60  GFRAA >60 >60 >60 >60    LIVER FUNCTION TESTS: Recent Labs    08/11/19 0427 08/12/19 1317 08/13/19 0535 08/14/19 0647  BILITOT 0.3 0.2* 0.3 0.4  AST 54* 25 56* 43*  ALT 74* 45* 69* 74*  ALKPHOS 233* 183* 164* 149*  PROT 6.8 6.9 6.6 7.0  ALBUMIN 2.0* 2.1* 2.0* 2.0*    Assessment and Plan:  34 y.o, female inpatient. History of IV drug use presented to the ED with lower back pain and fever found to have right iliopsoas abscess with myositis and osteomyelitis of the right sacrum and iliac. IR placed a right psoas drain on 3.12.21.  Per Epic output is;  35 ml    Pertinent Imaging None since placement  Pertinent IR History 3.12.21 - Placement right psoas drain with aspiration  Culture reads MRSA  Pertinent Allergies NKDA  All labs are within acceptable parameters.  Patient is afebrile. Patient is on subcutaneous prophylactic dose of lovenox. Culture from aspiration of right psoas resulted in MRSA   Recommend team continue with flushing TID, output recording q shift and dressing changes as needed. Would consider additional imaging when output is less than 10 ml for 24 hours not including flush material.    Electronically Signed: Avel Peace, NP 08/15/2019, 1:07 PM   I spent a total of 25 Minutes at the the patient's bedside AND on the patient's hospital floor or unit, greater than 50% of which was counseling/coordinating care for right psoas drain.

## 2019-08-15 NOTE — Progress Notes (Signed)
[facility] PROGRESS NOTE    Progress Note  Leslie Molina  [id number] DOB: 1985-08-09 DOA: 08/03/2019 PCP: [facility] [place]     Brief Narrative:   Leslie Molina is an 34 y.o. 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  The abscess extends to the posterior aspect of the proximal right thigh.  Culture data: COVID 3/3 >negative  MRSA PCR 3/3 >Positive  Blood culture 3/3 >MRSA  Pelvic abcess culture 3/3 >Abundant Staph aures, full result pending.  Antibiotic regimen: Daptomycin 3/3 once Cefepime 3/3-3/4 Clindamycin 3/3 once Vancomycin 3/3>>  Procedures: MRI of the right hip on 08/02/2019: Extensive right iliopsoas abscess extending from at least the L3-4 level to the insertion of the iliopsoas tendon on the lesser trochanter. 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  Myositis of the right gluteus minimus, medius, and maximus and right piriformis muscles. 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  Abscess extends through the posterior aspect of the right SI joint into the gluteal musculature. Fluid along the fascial planes in the anterior aspect of the proximal right thigh likely representing infectious fasciitis.  MRI of the lumbar spine on 08/03/2019: Septic right sacroiliac joint with associated osteomyelitis of the right sacral ala.  Extensive right iliopsoas abscess.  Edema and enhancement of the right gluteal muscles and right ultifidus muscle at the L5-S1 level consistent with myositis. No evidence of epidural extension of abscess at this time.  CT of the neck on 08/06/2019 showed multiple peripheral predominant nodules at  the lung apices suspicious for septic emboli and a right empyema with inflammation and stranding on the right retroclavicular region and right lower neck consistent with soft tissue infection.  IR was consulted who performe ultrasound-guided thoracentesis on 08/09/2019, aspirated 14 mL of purulent material.  Culture grew MRSA.  CT of the abdomen and pelvis 08/10/2019 showed Continued significant enlargement of right ileo psoas muscle with multiple irregular and peripherally enhancing low densities throughout its course, most consistent with abscesses. Probable abscesses are also noted in the right iliacus muscle as well as in the right gluteus medius muscle.  IR was reconsulted to perform gluteal abscess and right iliopsoas abscess needle aspiration on 08/11/2019 see below for further details.     Assessment/Plan:   Sepsis secondary to MRSA bacteremia and iliopsoas abscess/osteomyelitis of the sacrum/myositis and empyema: Cardiothoracic surgery was consulted who recommended drainage of the empyema. Blood cultures from 08/03/2019 show staph aureus repeated blood cultures from 08/05/2019 showed no growth over 20. She now has remained afebrile continue IV empiric antibiotics. Patient defervesced her leukocytosis improved, she has refused an MRI of the hip for the past 2 days, but she is now complaining of right hip pain today.  She has agreed to get the MRI tomorrow on 08/15/2019.  Iliopsoas/gluteal abscess/osteomyelitis of the sacrum and possible infection of the right dorsum hand and right sternoclavicular joint infection: MRI of the hip and lumbar spine as below. She is currently on IV Dilaudid and oral oxycodone. IR consulted with drain placement of gluteal abscess and CT-guided right gluteal cyst aspiration performed on 3.12.2021.  Culture from both sides was sent no growth till date. Patient  has refused MRI of the right hip for the last 2 days, she refused to have the MRI done again on  08/14/2019. She has agreed to have it on 08/15/2019 we will give a single dose of extra IV Dilaudid.  Peritonitis/IUD perforation: CT scan of the abdomen her abdominal exam is benign. Gynecology and general surgery was consulted and they have signed off. They recommended no surgical intervention at this time.  Hypokalemia: Repleted orally now resolved.  Bipolar disorder/anxiety/depression: Home medications Cymbalta, Seroquel trazodone were held.  Narcotic seeking behavior: Patient is in a bad mood as her narcotics were decreased yesterday as she was sleepy and not able to carry on a conversation, she was groggy and did not have her breakfast yesterday until after 10 AM.  After this she has been complaining and arguing as she wants her narcotics increase. She threatened me today saying that her mom was coming over today to speak with [place].   DVT prophylaxis: heparin Family Communication:none Disposition Plan/Barrier to D/C: She will probably be discharged when she has completed her course of IV antibiotic she is not a candidate to go home with a PICC line and 6 weeks of IV antibiotics.  Her start date is 08/05/2019 she will need to continue antibiotics for 6 weeks she will need to finish her course in house.  Code Status:     Code Status Orders  (From admission, onward)         Start     Ordered   08/03/19 1729  Full code  Continuous     08/03/19 1729        Code Status History    Date Active Date Inactive Code Status Order ID Comments User Context   08/02/2019 1859 08/03/2019 1708 Full Code [id number]  Collier Bullock, MD ED   Advance Care Planning Activity        IV Access:    Peripheral IV   Procedures and diagnostic studies:   No results found.   Medical Consultants:    None.  Anti-Infectives:   IV vancomycin  Subjective:    Leslie Molina she continues to complain of right hip pain.  Objective:    Vitals:   08/14/19 2039 08/15/19 0458 08/15/19 0654  08/15/19 0815  BP: 104/65  95/65 104/67  Pulse: 100  (!) 104 96  Resp: 16     Temp: 99.9 F (37.7 C)  100 F (37.8 C) 98.4 F (36.9 C)  TempSrc: Oral  Oral Oral  SpO2: 96%  96% 98%  Weight:  66.5 kg    Height:       SpO2: 98 % O2 Flow Rate (L/min): 2 L/min   Intake/Output Summary (Last 24 hours) at 08/15/2019 0901 Last data filed at 08/15/2019 0800 Gross per 24 hour  Intake 1243.82 ml  Output 1885 ml  Net -641.18 ml   Filed Weights   08/13/19 0347 08/14/19 0639 08/15/19 0458  Weight: 66.7 kg 66.1 kg 66.5 kg    Exam: General exam: In no acute distress. Respiratory system: Good air movement and clear to auscultation. Cardiovascular system: S1 & S2 heard, RRR. No JVD. Gastrointestinal system: Abdomen is nondistended, soft and nontender.  Central nervous system: Alert and oriented. No focal neurological deficits. Extremities: No pedal edema. Skin: No rashes, lesions or ulcers  Data Reviewed:    Labs: Basic Metabolic Panel: Recent Labs  Lab 08/11/19 0427 08/11/19 0427 08/12/19 1317 08/12/19 1317 08/13/19 0535 08/13/19 0535 08/14/19 0647 08/15/19 0519  NA 136  --  137  --  137  --  139 135  K 3.8   < > 3.8   < > 4.0   < > 4.0 4.3  CL 97*  --  100  --  103  --  99 95*  CO2 26  --  26  --  26  --  26 28  GLUCOSE 165*  --  116*  --  124*  --  96 105*  BUN 7  --  5*  --  7  --  5* 8  CREATININE 0.46  --  0.46  --  0.45  --  0.50 0.67  CALCIUM 8.6*  --  8.2*  --  8.7*  --  9.0 8.9   < > = values in this interval not displayed.   GFR Estimated Creatinine Clearance: 86.5 mL/min (by C-G formula based on SCr of 0.67 mg/dL). Liver Function Tests: Recent Labs  Lab 08/11/19 0427 08/12/19 1317 08/13/19 0535 08/14/19 0647  AST 54* 25 56* 43*  ALT 74* 45* 69* 74*  ALKPHOS 233* 183* 164* 149*  BILITOT 0.3 0.2* 0.3 0.4  PROT 6.8 6.9 6.6 7.0  ALBUMIN 2.0* 2.1* 2.0* 2.0*   No results for input(s): LIPASE, AMYLASE in the last 168 hours. No results for input(s):  AMMONIA in the last 168 hours. Coagulation profile Recent Labs  Lab 08/11/19 0427  INR 1.3*   COVID-19 Labs  No results for input(s): DDIMER, FERRITIN, LDH, CRP in the last 72 hours.  Lab Results  Component Value Date   SARSCOV2NAA NEGATIVE 08/02/2019   [place] NEGATIVE 07/29/2019    CBC: Recent Labs  Lab 08/11/19 0427 08/12/19 1317 08/13/19 0535 08/14/19 0647  WBC 10.7* 9.4 7.7 7.0  NEUTROABS 8.3*  --   --   --   HGB 9.8* 9.5* 9.1* 9.3*  HCT 30.9* 29.3* 29.5* 30.2*  MCV 86.6 84.9 87.3 89.1  PLT 598* 617* 621* 647*   Cardiac Enzymes: Recent Labs  Lab 08/10/19 1944  CKTOTAL 37*   BNP (last 3 results) No results for input(s): PROBNP in the last 8760 hours. CBG: No results for input(s): GLUCAP in the last 168 hours. D-Dimer: No results for input(s): DDIMER in the last 72 hours. Hgb A1c: No results for input(s): HGBA1C in the last 72 hours. Lipid Profile: No results for input(s): CHOL, HDL, LDLCALC, TRIG, CHOLHDL, LDLDIRECT in the last 72 hours. Thyroid function studies: No results for input(s): TSH, T4TOTAL, T3FREE, THYROIDAB in the last 72 hours.  Invalid input(s): FREET3 Anemia work up: No results for input(s): VITAMINB12, FOLATE, FERRITIN, TIBC, IRON, RETICCTPCT in the last 72 hours. Sepsis Labs: Recent Labs  Lab 08/11/19 0427 08/12/19 1317 08/13/19 0535 08/14/19 0647  WBC 10.7* 9.4 7.7 7.0   Microbiology Recent Results (from the past 240 hour(s))  Body fluid culture     Status: None   Collection Time: 08/09/19  4:54 PM   Specimen: Body Fluid  Result Value Ref Range Status   Specimen Description FLUID PLEURAL RIGHT  Final   Special Requests NONE  Final   Gram Stain   Final    ABUNDANT WBC PRESENT, PREDOMINANTLY PMN MODERATE GRAM POSITIVE COCCI Performed at [facility], [address] [place], [place] [address]    Culture FEW METHICILLIN RESISTANT STAPHYLOCOCCUS AUREUS  Final   Report Status 08/12/2019 FINAL  Final   Organism ID,  Bacteria METHICILLIN RESISTANT STAPHYLOCOCCUS AUREUS  Final      Susceptibility   Methicillin resistant staphylococcus aureus -  MIC*    CIPROFLOXACIN >=8 RESISTANT Resistant     ERYTHROMYCIN >=8 RESISTANT Resistant     GENTAMICIN <=0.5 SENSITIVE Sensitive     OXACILLIN >=4 RESISTANT Resistant     TETRACYCLINE <=1 SENSITIVE Sensitive     VANCOMYCIN 1 SENSITIVE Sensitive     TRIMETH/SULFA <=10 SENSITIVE Sensitive     CLINDAMYCIN <=0.25 SENSITIVE Sensitive     RIFAMPIN <=0.5 SENSITIVE Sensitive     Inducible Clindamycin NEGATIVE Sensitive     * FEW METHICILLIN RESISTANT STAPHYLOCOCCUS AUREUS  Aerobic/Anaerobic Culture (surgical/deep wound)     Status: None (Preliminary result)   Collection Time: 08/11/19 10:35 AM   Specimen: Abscess  Result Value Ref Range Status   Specimen Description ABSCESS RIGHT  Final   Special Requests Normal  Final   Gram Stain   Final    ABUNDANT WBC PRESENT, PREDOMINANTLY PMN ABUNDANT GRAM POSITIVE COCCI Performed at [facility], [address] [place], [place] [address]    Culture   Final    ABUNDANT METHICILLIN RESISTANT STAPHYLOCOCCUS AUREUS NO ANAEROBES ISOLATED; CULTURE IN PROGRESS FOR 5 DAYS    Report Status PENDING  Incomplete   Organism ID, Bacteria METHICILLIN RESISTANT STAPHYLOCOCCUS AUREUS  Final      Susceptibility   Methicillin resistant staphylococcus aureus - MIC*    CIPROFLOXACIN >=8 RESISTANT Resistant     ERYTHROMYCIN >=8 RESISTANT Resistant     GENTAMICIN <=0.5 SENSITIVE Sensitive     OXACILLIN >=4 RESISTANT Resistant     TETRACYCLINE <=1 SENSITIVE Sensitive     VANCOMYCIN 1 SENSITIVE Sensitive     TRIMETH/SULFA <=10 SENSITIVE Sensitive     CLINDAMYCIN <=0.25 SENSITIVE Sensitive     RIFAMPIN <=0.5 SENSITIVE Sensitive     Inducible Clindamycin NEGATIVE Sensitive     * ABUNDANT METHICILLIN RESISTANT STAPHYLOCOCCUS AUREUS  Aerobic/Anaerobic Culture (surgical/deep wound)     Status: None (Preliminary result)   Collection Time:  08/11/19 10:38 AM   Specimen: Abscess  Result Value Ref Range Status   Specimen Description ABSCESS RIGHT GLUTEAL  Final   Special Requests Normal  Final   Gram Stain   Final    FEW WBC PRESENT, PREDOMINANTLY PMN NO ORGANISMS SEEN Performed at [facility], [address], [place], [place] [address]    Culture   Final    FEW METHICILLIN RESISTANT STAPHYLOCOCCUS AUREUS NO ANAEROBES ISOLATED; CULTURE IN PROGRESS FOR 5 DAYS    Report Status PENDING  Incomplete   Organism ID, Bacteria METHICILLIN RESISTANT STAPHYLOCOCCUS AUREUS  Final      Susceptibility   Methicillin resistant staphylococcus aureus - MIC*    CIPROFLOXACIN >=8 RESISTANT Resistant     ERYTHROMYCIN >=8 RESISTANT Resistant     GENTAMICIN <=0.5 SENSITIVE Sensitive     OXACILLIN >=4 RESISTANT Resistant     TETRACYCLINE <=1 SENSITIVE Sensitive     VANCOMYCIN 1 SENSITIVE Sensitive     TRIMETH/SULFA <=10 SENSITIVE Sensitive     CLINDAMYCIN <=0.25 SENSITIVE Sensitive     RIFAMPIN <=0.5 SENSITIVE Sensitive     Inducible Clindamycin NEGATIVE Sensitive     * FEW METHICILLIN RESISTANT STAPHYLOCOCCUS AUREUS     Medications:   . Chlorhexidine Gluconate Cloth  6 each Topical Daily  . docusate sodium  100 mg Oral BID  . DULoxetine  30 mg Oral Daily  . enoxaparin (LOVENOX) injection  40 mg Subcutaneous QHS  . feeding supplement (ENSURE ENLIVE)  237 mL Oral BID BM  . gabapentin  300 mg Oral TID  .  polyethylene glycol  17 g Oral Daily  . QUEtiapine  150 mg Oral QHS  . sodium chloride flush  10-40 mL Intracatheter Q12H  . traZODone  150 mg Oral QHS   Continuous Infusions: . sodium chloride 250 mL (08/14/19 2013)  . vancomycin 1,250 mg (08/15/19 0457)      LOS: 12 days   Charlynne Cousins  [facility]  08/15/2019, 9:01 AM

## 2019-08-15 NOTE — Progress Notes (Signed)
[facility]  Date of Admission:  08/03/2019     Total days of antibiotics 14         ASSESSMENT:  Ms. Cruse continues to have improved temperature curve with max temperature of 100 over the past 24 hours. Has had 35 cc of purulent drainage in the last 24 hours. Source control is improving. Awaiting MRI of the right hip for further evaluation. Pain management continues per primary team. Renal function is stable without evidence of nephrotoxicity. Will continue current dose of vancomycin. She will need at least 6 weeks of IV therapy and will consider setting end date when source control confirmed.   PLAN:  1. Continue current dose of vancomycin. 2. Drain management per IR 3. Await results of MRI. 4. Pain management per primary team.   Principal Problem:   MRSA bacteremia Active Problems:   Sepsis ([place])   Iliopsoas abscess on right ([place])   Septic shock ([place])   Pain of right clavicle   Right hand pain   Osteomyelitis of sacrum ([place])   Pleural empyema ([facility])   Pneumonia   Empyema ([place])   . Chlorhexidine Gluconate Cloth  6 each Topical Daily  . docusate sodium  100 mg Oral BID  . DULoxetine  30 mg Oral Daily  . enoxaparin (LOVENOX) injection  40 mg Subcutaneous QHS  . feeding supplement (ENSURE ENLIVE)  237 mL Oral BID BM  . gabapentin  300 mg Oral TID  .  HYDROmorphone (DILAUDID) injection  1 mg Intravenous Once  . polyethylene glycol  17 g Oral Daily  . QUEtiapine  150 mg Oral QHS  . sodium chloride flush  10-40 mL Intracatheter Q12H  . traZODone  150 mg Oral QHS    SUBJECTIVE:  Elevated temperature of 100 overnight with no fevers. Continues to have right sided hip pain. JP drain with 35 cc overnight of purulent drainage. Has been moving around the room and sitting in the chair.   No Known Allergies   Review of Systems: Review of Systems  Constitutional: Negative for chills, fever and weight loss.  Respiratory: Negative for cough,  shortness of breath and wheezing.   Cardiovascular: Negative for chest pain and leg swelling.  Gastrointestinal: Negative for abdominal pain, constipation, diarrhea, nausea and vomiting.  Musculoskeletal:       Positive for right hip pain.   Skin: Negative for rash.      OBJECTIVE: Vitals:   08/14/19 2039 08/15/19 0458 08/15/19 0654 08/15/19 0815  BP: 104/65  95/65 104/67  Pulse: 100  (!) 104 96  Resp: 16     Temp: 99.9 F (37.7 C)  100 F (37.8 C) 98.4 F (36.9 C)  TempSrc: Oral  Oral Oral  SpO2: 96%  96% 98%  Weight:  66.5 kg    Height:       Body mass index is 27.7 kg/m.  Physical Exam Constitutional:      General: She is not in acute distress.    Appearance: She is well-developed.     Comments: Lying in bed with head of bed elevated; pleasant.   Cardiovascular:     Rate and Rhythm: Normal rate and regular rhythm.     Heart sounds: Normal heart sounds.     Comments: Left upper extremity PICC line with dressing that is clean and dry; site is without evidence of infection.  Pulmonary:     Effort: Pulmonary effort is normal.     Breath sounds: Normal breath  sounds.  Musculoskeletal:     Comments: JP drain right psoas is charged with purulent drainage.   Skin:    General: Skin is warm and dry.  Neurological:     Mental Status: She is alert and oriented to person, place, and time.     Lab Results Lab Results  Component Value Date   WBC 7.0 08/14/2019   HGB 9.3 (L) 08/14/2019   HCT 30.2 (L) 08/14/2019   MCV 89.1 08/14/2019   PLT 647 (H) 08/14/2019    Lab Results  Component Value Date   CREATININE 0.67 08/15/2019   BUN 8 08/15/2019   NA 135 08/15/2019   K 4.3 08/15/2019   CL 95 (L) 08/15/2019   CO2 28 08/15/2019    Lab Results  Component Value Date   ALT 74 (H) 08/14/2019   AST 43 (H) 08/14/2019   ALKPHOS 149 (H) 08/14/2019   BILITOT 0.4 08/14/2019     Microbiology: Recent Results (from the past 240 hour(s))  Body fluid culture     Status: None    Collection Time: 08/09/19  4:54 PM   Specimen: Body Fluid  Result Value Ref Range Status   Specimen Description FLUID PLEURAL RIGHT  Final   Special Requests NONE  Final   Gram Stain   Final    ABUNDANT WBC PRESENT, PREDOMINANTLY PMN MODERATE GRAM POSITIVE COCCI Performed at [facility], [address], [place], [place] [address]    Culture FEW METHICILLIN RESISTANT STAPHYLOCOCCUS AUREUS  Final   Report Status 08/12/2019 FINAL  Final   Organism ID, Bacteria METHICILLIN RESISTANT STAPHYLOCOCCUS AUREUS  Final      Susceptibility   Methicillin resistant staphylococcus aureus - MIC*    CIPROFLOXACIN >=8 RESISTANT Resistant     ERYTHROMYCIN >=8 RESISTANT Resistant     GENTAMICIN <=0.5 SENSITIVE Sensitive     OXACILLIN >=4 RESISTANT Resistant     TETRACYCLINE <=1 SENSITIVE Sensitive     VANCOMYCIN 1 SENSITIVE Sensitive     TRIMETH/SULFA <=10 SENSITIVE Sensitive     CLINDAMYCIN <=0.25 SENSITIVE Sensitive     RIFAMPIN <=0.5 SENSITIVE Sensitive     Inducible Clindamycin NEGATIVE Sensitive     * FEW METHICILLIN RESISTANT STAPHYLOCOCCUS AUREUS  Aerobic/Anaerobic Culture (surgical/deep wound)     Status: None (Preliminary result)   Collection Time: 08/11/19 10:35 AM   Specimen: Abscess  Result Value Ref Range Status   Specimen Description ABSCESS RIGHT  Final   Special Requests Normal  Final   Gram Stain   Final    ABUNDANT WBC PRESENT, PREDOMINANTLY PMN ABUNDANT GRAM POSITIVE COCCI Performed at [facility], [address], [place], [place] [address]    Culture   Final    ABUNDANT METHICILLIN RESISTANT STAPHYLOCOCCUS AUREUS NO ANAEROBES ISOLATED; CULTURE IN PROGRESS FOR 5 DAYS    Report Status PENDING  Incomplete   Organism ID, Bacteria METHICILLIN RESISTANT STAPHYLOCOCCUS AUREUS  Final      Susceptibility   Methicillin resistant staphylococcus aureus - MIC*    CIPROFLOXACIN >=8 RESISTANT Resistant     ERYTHROMYCIN >=8 RESISTANT Resistant     GENTAMICIN <=0.5  SENSITIVE Sensitive     OXACILLIN >=4 RESISTANT Resistant     TETRACYCLINE <=1 SENSITIVE Sensitive     VANCOMYCIN 1 SENSITIVE Sensitive     TRIMETH/SULFA <=10 SENSITIVE Sensitive     CLINDAMYCIN <=0.25 SENSITIVE Sensitive     RIFAMPIN <=0.5 SENSITIVE Sensitive     Inducible Clindamycin NEGATIVE Sensitive     *  ABUNDANT METHICILLIN RESISTANT STAPHYLOCOCCUS AUREUS  Aerobic/Anaerobic Culture (surgical/deep wound)     Status: None (Preliminary result)   Collection Time: 08/11/19 10:38 AM   Specimen: Abscess  Result Value Ref Range Status   Specimen Description ABSCESS RIGHT GLUTEAL  Final   Special Requests Normal  Final   Gram Stain   Final    FEW WBC PRESENT, PREDOMINANTLY PMN NO ORGANISMS SEEN Performed at [facility], [address] [place], [place] [address]    Culture   Final    FEW METHICILLIN RESISTANT STAPHYLOCOCCUS AUREUS NO ANAEROBES ISOLATED; CULTURE IN PROGRESS FOR 5 DAYS    Report Status PENDING  Incomplete   Organism ID, Bacteria METHICILLIN RESISTANT STAPHYLOCOCCUS AUREUS  Final      Susceptibility   Methicillin resistant staphylococcus aureus - MIC*    CIPROFLOXACIN >=8 RESISTANT Resistant     ERYTHROMYCIN >=8 RESISTANT Resistant     GENTAMICIN <=0.5 SENSITIVE Sensitive     OXACILLIN >=4 RESISTANT Resistant     TETRACYCLINE <=1 SENSITIVE Sensitive     VANCOMYCIN 1 SENSITIVE Sensitive     TRIMETH/SULFA <=10 SENSITIVE Sensitive     CLINDAMYCIN <=0.25 SENSITIVE Sensitive     RIFAMPIN <=0.5 SENSITIVE Sensitive     Inducible Clindamycin NEGATIVE Sensitive     * FEW METHICILLIN RESISTANT STAPHYLOCOCCUS AUREUS     Terri Piedra, NP [facility]   08/15/2019  1:02 PM

## 2019-08-16 ENCOUNTER — Inpatient Hospital Stay (HOSPITAL_COMMUNITY): Payer: Medicaid Other

## 2019-08-16 DIAGNOSIS — M0009 Staphylococcal polyarthritis: Secondary | ICD-10-CM

## 2019-08-16 DIAGNOSIS — M6008 Infective myositis, other site: Secondary | ICD-10-CM

## 2019-08-16 DIAGNOSIS — M4628 Osteomyelitis of vertebra, sacral and sacrococcygeal region: Secondary | ICD-10-CM

## 2019-08-16 DIAGNOSIS — M25451 Effusion, right hip: Secondary | ICD-10-CM

## 2019-08-16 DIAGNOSIS — J869 Pyothorax without fistula: Secondary | ICD-10-CM

## 2019-08-16 LAB — AEROBIC/ANAEROBIC CULTURE W GRAM STAIN (SURGICAL/DEEP WOUND)
Special Requests: NORMAL
Special Requests: NORMAL

## 2019-08-16 LAB — BASIC METABOLIC PANEL
Anion gap: 12 (ref 5–15)
Anion gap: 11 (ref 5–15)
BUN: 8 mg/dL (ref 6–20)
BUN: 8 mg/dL (ref 6–20)
CO2: 28 mmol/L (ref 22–32)
CO2: 27 mmol/L (ref 22–32)
Calcium: 9 mg/dL (ref 8.9–10.3)
Calcium: 8.6 mg/dL — ABNORMAL LOW (ref 8.9–10.3)
Chloride: 97 mmol/L — ABNORMAL LOW (ref 98–111)
Chloride: 98 mmol/L (ref 98–111)
Creatinine, Ser: 0.57 mg/dL (ref 0.44–1.00)
Creatinine, Ser: 0.52 mg/dL (ref 0.44–1.00)
GFR calc Af Amer: >60 mL/min (ref >60)
GFR calc Af Amer: >60 mL/min (ref >60)
GFR calc non Af Amer: >60 mL/min (ref >60)
GFR calc non Af Amer: >60 mL/min (ref >60)
Glucose, Bld: 101 mg/dL — ABNORMAL HIGH (ref 70–99)
Glucose, Bld: 144 mg/dL — ABNORMAL HIGH (ref 70–99)
Potassium: 4.3 mmol/L (ref 3.5–5.1)
Potassium: 3.6 mmol/L (ref 3.5–5.1)
Sodium: 137 mmol/L (ref 135–145)
Sodium: 136 mmol/L (ref 135–145)

## 2019-08-16 LAB — VANCOMYCIN, PEAK: Vancomycin Pk: 34 ug/mL (ref 30–40)

## 2019-08-16 LAB — VANCOMYCIN, TROUGH: Vancomycin Tr: 14 ug/mL — ABNORMAL LOW (ref 15–20)

## 2019-08-16 IMAGING — MR MR HIP*R* W/O CM
4 of 5 series · 17 of 40 positions shown · non-contrast
Comparison: CT [DATE]. MRI [DATE]

CLINICAL DATA: Right iliopsoas abscess, myositis

EXAM:
MR OF THE RIGHT HIP WITHOUT CONTRAST
TECHNIQUE: Multiplanar, multisequence MR imaging was performed. No intravenous
contrast was administered.

[Series 3: T2 fat-sat · coronal · 4.0mm · 0.82mm/px · 3 of 28 slices shown (1 of 2)]
[im 6/28]
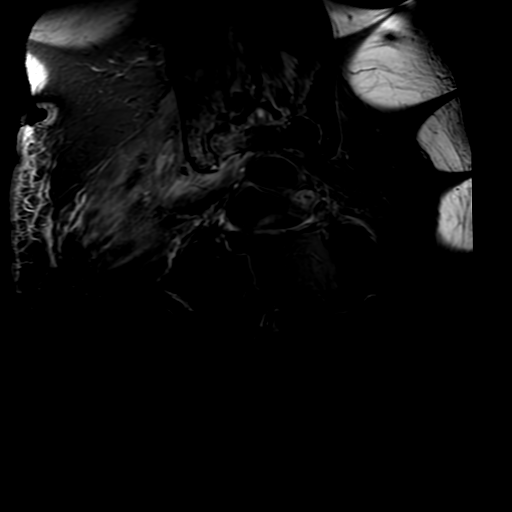
[im 17/28]
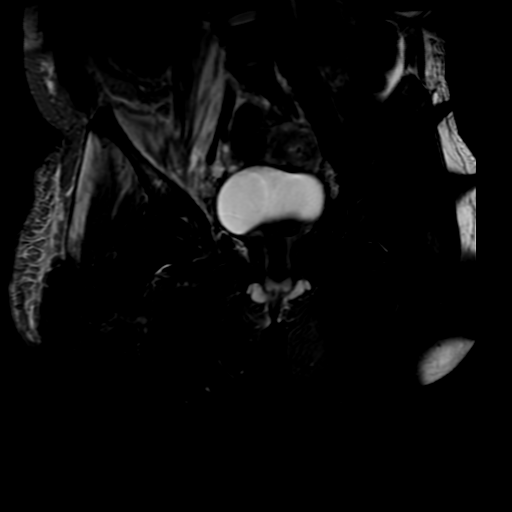
[im 28/28]
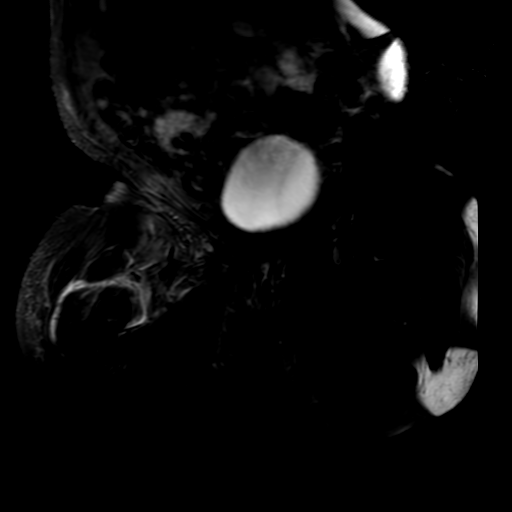

[Series 4: T1 · axial · 4.0mm · 0.43mm/px · z∈[-4,+171]mm · 3 of 47 slices shown]
[im 6/47]
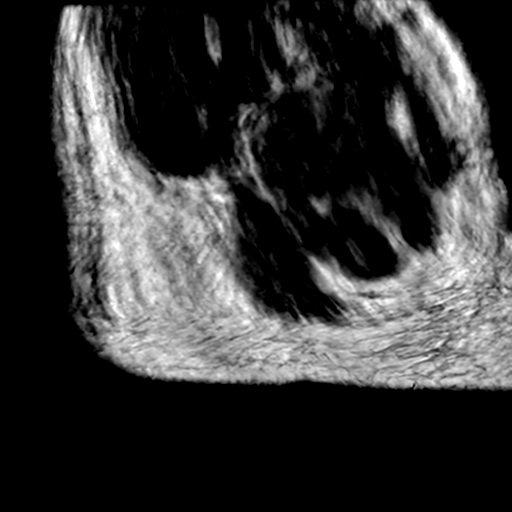
[im 26/47]
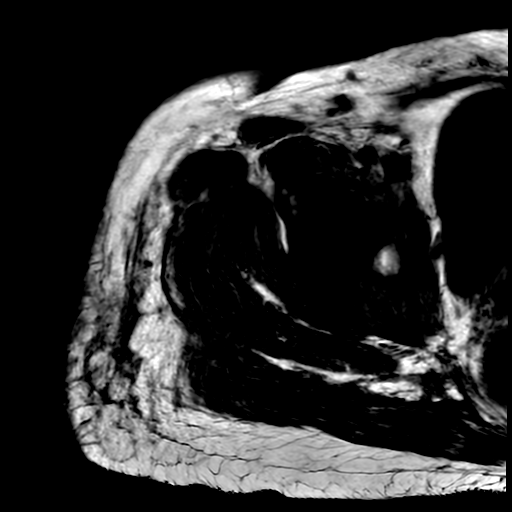
[im 41/47]
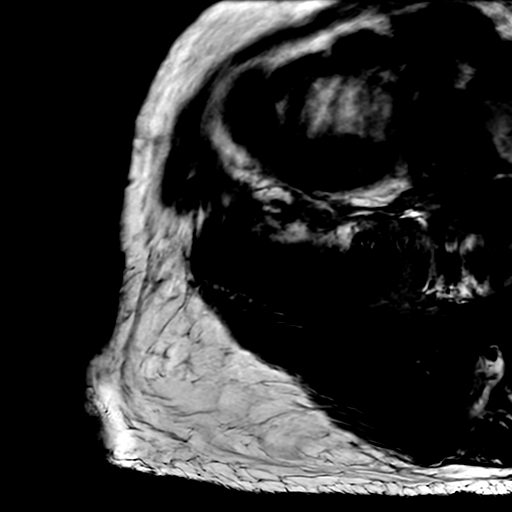

[Series 6: T2 fat-sat · axial · 4.0mm · 0.59mm/px · z∈[+14,+139]mm · 3 of 36 slices shown (2 of 2)]
[im 6/36]
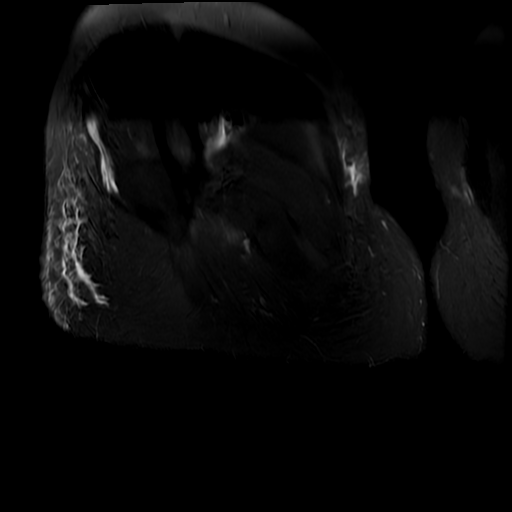
[im 21/36]
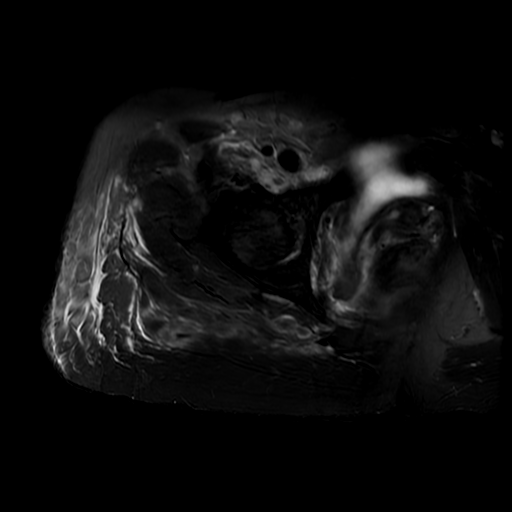
[im 31/36]
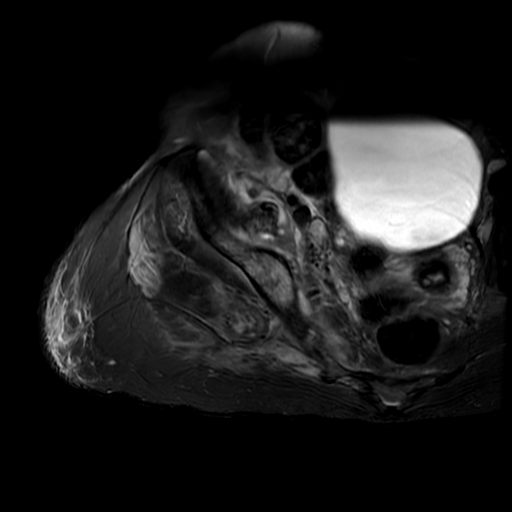

[Series 8: PD fat-sat · sagittal · 5.0mm · 0.47mm/px · 8 of 42 slices shown]
[im 1/42]
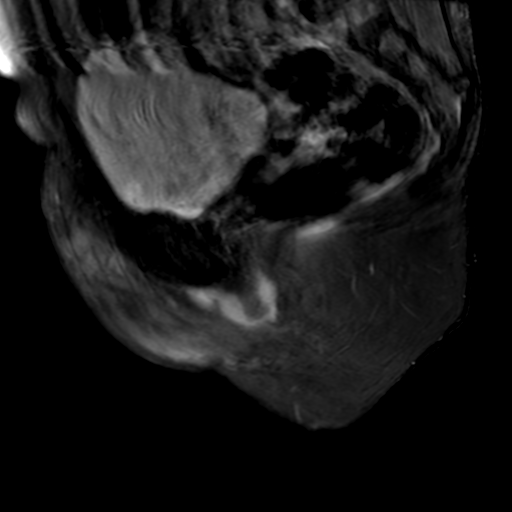
[im 6/42]
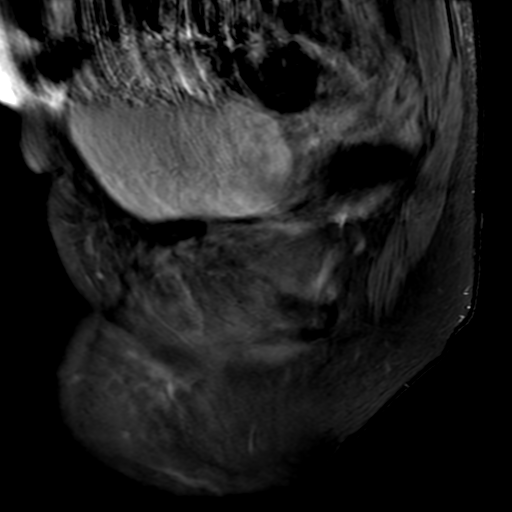
[im 11/42]
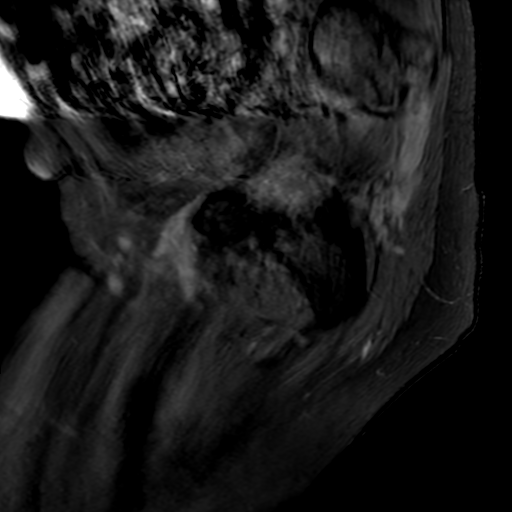
[im 16/42]
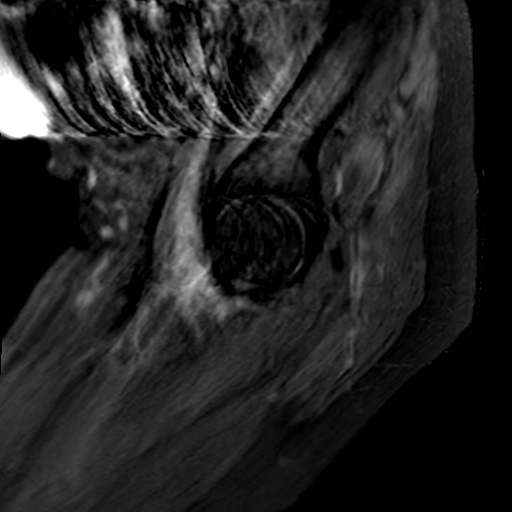
[im 21/42]
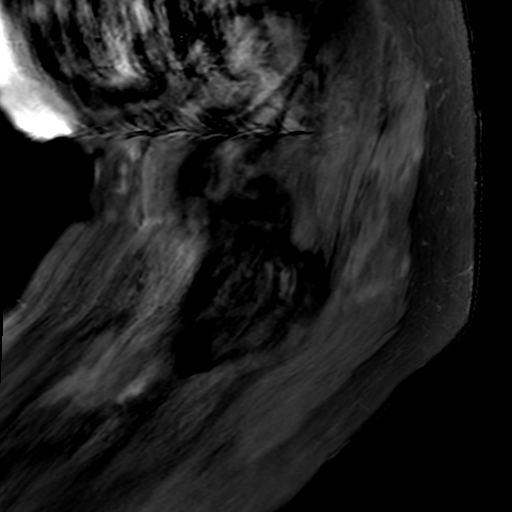
[im 26/42]
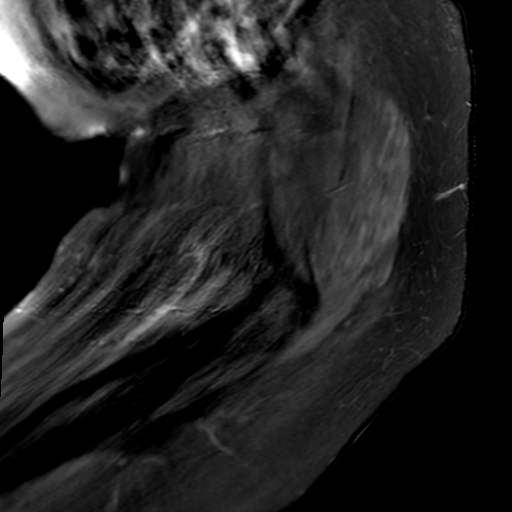
[im 31/42]
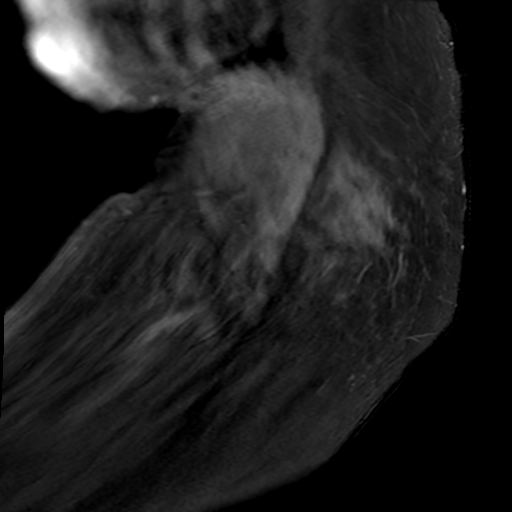
[im 36/42]
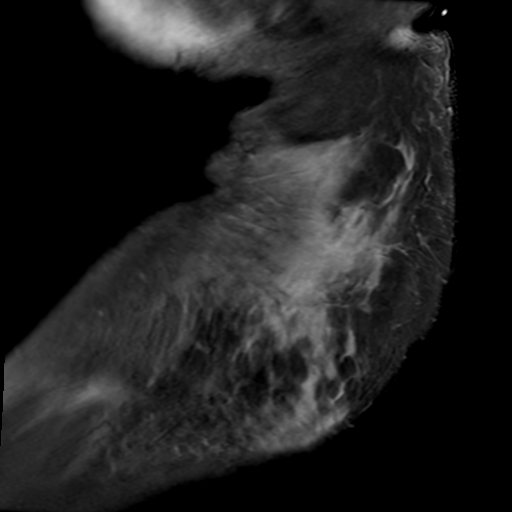

[17 of 40 positions shown; findings below may reference images not displayed]

FINDINGS: Technical note: Examination is markedly degraded by motion artifact
and difficulties with patient positioning. Examination was ordered
without and with intravenous contrast. Patient was unable to
tolerate any postcontrast imaging.

Bones/Joint/Cartilage

Complex right sacroiliac joint effusion with extensive marrow edema
within the adjacent right hemi sacrum and right iliac bone (series
3, images 10-20). There is associated confluent low T1 signal at
these sites compatible with acute osteomyelitis. Edema extends into
the supra-acetabular aspect of the right iliac bone without
extending to the subchondral portion of the bone. Overall the degree
of osseous involvement has progressed compared to [DATE].

Right hip joint space is maintained. No fracture or dislocation.
Trace joint effusion, similar in size to the prior study. Mild
marrow signal changes along the inferior aspect of the left SI joint
is favored degenerative/reactive.

Remaining osseous structures appear within normal limits. No
fracture or malalignment.

Ligaments

Unremarkable.

Myotendinous Structures and Soft Tissues

Extensive complex fluid collections again seen within the right
iliopsoas musculature (series 3, images 18-21; series 6, images
1-6). Fluid collection extends distally along the course of the
right iliopsoas myotendinous junction and closely approximates the
tendon insertion at the lesser trochanter (series 6, images 21-22).
Exact measurements of the collection are difficult to obtain given
the complex multiloculated configuration. Approximate length of the
collection is 23 cm. Overall volume of the fluid appears to of
slightly decreased from prior. Partially visualized drainage
catheter with posterior approach.

Lobulated fluid collections are also seen extending posterolaterally
from the inferior aspect of the right SI joint (series 6, images
1-12) extending into the right gluteus medius and maximus
musculature, slightly increased from prior. Extensive myositis
within the right gluteal and right piriformis musculature. Mild
edema within the proximal adductor musculature without fluid
collection. Fluid and edema tracks over the lateral and
anterolateral aspect of the visualized proximal thigh with some
edema also evident within the proximal quadriceps musculature.
IMPRESSION: 1. Septic arthritis of the right SI joint with associated
osteomyelitis of the right iliac bone and right sacrum. The degree
of bony involvement has progressed from the comparison study
[DATE].
2. Extensive myositis and complex abscesses within the right
iliopsoas and right gluteal musculature. The overall size of the
iliopsoas abscesses appears slightly decreased from prior. Slight
progression in the size of right gluteal intramuscular abscesses.
3. Trace right hip joint effusion, similar to prior. Septic
arthritis not excluded.
4. Examination is motion degraded. Patient was unable to tolerate
any postcontrast imaging. Best possible images were submitted for
interpretation.

## 2019-08-16 MED ORDER — VANCOMYCIN HCL IN DEXTROSE 1-5 GM/200ML-% IV SOLN
1000.0000 mg | Freq: Three times a day (TID) | INTRAVENOUS | Status: DC
  Administered 2019-08-16 – 2019-08-24 (×24): 1000 mg via INTRAVENOUS
  Filled 2019-08-16 (×26): qty 200

## 2019-08-16 MED ORDER — FENTANYL CITRATE (PF) 100 MCG/2ML IJ SOLN
12.5000 ug | INTRAMUSCULAR | Status: AC | PRN
  Administered 2019-08-16 – 2019-08-17 (×6): 12.5 ug via INTRAVENOUS
  Filled 2019-08-16 (×6): qty 2

## 2019-08-16 NOTE — Progress Notes (Addendum)
I attempted to draw morning labs- could not get blood back with picc-although it  would flush good. Transport came to pick patient up for MRI before iv team could be notified. I will hand off to day shift nurse.

## 2019-08-16 NOTE — Progress Notes (Signed)
PROGRESS NOTE  Leslie Molina XNA:[id number] DOB: 16-Aug-1985 DOA: 08/03/2019 [facility]CP: [facility]  Brief summary:  Leslie Molina is an 34 y.o. 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  The abscess extends to the posterior aspect of the proximal right thigh.  Culture data: COVID 3/3 >negative  MRSA PCR 3/3 >Positive  Blood culture 3/3 >MRSA  Pelvic abcess culture 3/3 >Abundant Staph aures, full result pending.  Antibiotic regimen: Daptomycin 3/3 once Cefepime 3/3-3/4 Clindamycin 3/3 once Vancomycin 3/3>>  Procedures: MRI of the right hip on 08/02/2019: Extensive right iliopsoas abscess extending from at least the L3-4 level to the insertion of the iliopsoas tendon on the lesser trochanter. 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  Myositis of the right gluteus minimus, medius, and maximus and right piriformis muscles. 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  Abscess extends through the posterior aspect of the right SI joint into the gluteal musculature. Fluid along the fascial planes in the anterior aspect of the proximal right thigh likely representing infectious fasciitis.  MRI of the lumbar spine on 08/03/2019: Septic right sacroiliac joint with associated osteomyelitis of the right sacral ala.  Extensive right iliopsoas abscess.  Edema and enhancement of the right gluteal muscles and right ultifidus muscle at the L5-S1 level consistent with myositis. No evidence of epidural extension of abscess at this time.  CT of the neck on 08/06/2019 showed multiple peripheral predominant nodules at the lung apices suspicious for septic  emboli and a right empyema with inflammation and stranding on the right retroclavicular region and right lower neck consistent with soft tissue infection.  IR was consulted who performe ultrasound-guided thoracentesis on 08/09/2019, aspirated 14 mL of purulent material.  Culture grew MRSA.  CT of the abdomen and pelvis 08/10/2019 showed Continued significant enlargement of right ileo psoas muscle with multiple irregular and peripherally enhancing low densities throughout its course, most consistent with abscesses. Probable abscesses are also noted in the right iliacus muscle as well as in the right gluteus medius muscle.  IR was reconsulted to perform gluteal abscess and right iliopsoas abscess needle aspiration on 08/11/2019 see below for further details.      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[place])   Pleural empyema ([facility])   Pneumonia   Empyema ([place])  Sepsis secondary to MRSA bacteremia and iliopsoas/gluteal abscess/osteomyelitis of the sacrum/myositis Septic arthritis of the right SI joint on mri today possible infection of the right dorsum hand and right sternoclavicular joint infection Right chest empyema, thoracic surgery was initially consulted, thoracic surgery recommended IR consult due to lack of accessible fluid collection IR evaluated patient, s/p ultrasound guided thoracentesis with removal of 13 cc of purulent fluid on March 10, collection was too small the time no chest tube placement recommended. -S/p a  right psoas drain on 3.12.21 by IR, Recommend team continue with flushing TID, output recording q shift and dressing changes as needed. Would  consider additional imaging when output is less than 10 ml for 24 hours not including flush material.  -IV vancomycin ID and thoracic surgery following Orthopedic consulted by ID  Peritonitis/IUD perforation: CT scan of the abdomen her abdominal exam is benign. Gynecology and general surgery was consulted and they have signed off. They recommended no surgical intervention at this time.  Bipolar disorder/anxiety/depression: Home medications Cymbalta, Seroquel trazodone were held.  Hypokalemia, replaced, monitor   Thrombocytosis Likely reactive, monitor  Normocytic anemia, in the setting of acute illness Hemoglobin has been stable about 9 the last few days Monitor  Previously documented narcotic seeking behavior, please refer notes on March 16 by Dr. Aileen Fass Monitor  H/o IVDU  Cigarette smoking  DVT Prophylaxis: Lovenox  Code Status: Full  Family Communication: Family at bedside with patient permission  Disposition Plan:    Patient came from:                   Transferred from [facility]                                                                                        Anticipated d/c place:  TBD  Barriers to d/c OR conditions which need to be met to effect a safe d/c:  Not ready to discharge   Consultants:  ID  Orthopedic  IR  Critical care signed off  Wound care  General surgery signed off  Gyn signed off  Thoracic surgery  Procedures:  Ultrasound guided right-sided thoracentesis on March 10 by IR  a right psoas drain on 3.12.21  PICC line placement  Antibiotics:  IV vancomycin   Objective: BP 103/63   Pulse 96   Temp 99.1 F (37.3 C) (Oral)   Resp 16   Ht 5' 1" (1.549 m)   Wt 65.1 kg   LMP  (LMP Unknown)   SpO2 98%   BMI 27.13 kg/m   Intake/Output Summary (Last 24 hours) at 08/16/2019 1533 Last data filed at 08/16/2019 1238 Gross per 24 hour  Intake 1026.87 ml  Output 1575 ml  Net -548.13 ml   Filed Weights   08/14/19  0639 08/15/19 0458 08/16/19 0656  Weight: 66.1 kg 66.5 kg 65.1 kg    Exam: Patient is examined daily including today on 08/16/2019, exams remain the same as of yesterday except that has changed    General:  NAD, picc line left arm  Cardiovascular: RRR  Respiratory: CTABL  Abdomen: Soft/ND/NT, positive BS  Musculoskeletal: No Edema  Neuro: alert, oriented   Data Reviewed: Basic Metabolic Panel: Recent Labs  Lab 08/13/19 0535 08/14/19 0647 08/15/19 0519 08/16/19 0807 08/16/19 1109  NA 137 139 135 137 136  K 4.0 4.0 4.3 4.3 3.6  CL 103 99 95* 97* 98  CO2 _0 GLUCOSE 124* 96 105* 101* 144*  BUN 7 5* _1 CREATININE 0.45 0.50 0.67 0.57 0.52  CALCIUM 8.7* 9.0 8.9 9.0 8.6*   Liver Function Tests:  Recent Labs  Lab 08/11/19 0427 08/12/19 1317 08/13/19 0535 08/14/19 0647  AST 54* 25 56* 43*  ALT 74* 45* 69* 74*  ALKPHOS 233* 183* 164* 149*  BILITOT 0.3 0.2* 0.3 0.4  PROT 6.8 6.9 6.6 7.0  ALBUMIN 2.0* 2.1* 2.0* 2.0*   No results for input(s): LIPASE, AMYLASE in the last 168 hours. No results for input(s): AMMONIA in the last 168 hours. CBC: Recent Labs  Lab 08/11/19 0427 08/12/19 1317 08/13/19 0535 08/14/19 0647  WBC 10.7* 9.4 7.7 7.0  NEUTROABS 8.3*  --   --   --   HGB 9.8* 9.5* 9.1* 9.3*  HCT 30.9* 29.3* 29.5* 30.2*  MCV 86.6 84.9 87.3 89.1  PLT 598* 617* 621* 647*   Cardiac Enzymes:   Recent Labs  Lab 08/10/19 1944  CKTOTAL 37*   BNP (last 3 results) No results for input(s): BNP in the last 8760 hours.  ProBNP (last 3 results) No results for input(s): PROBNP in the last 8760 hours.  CBG: No results for input(s): GLUCAP in the last 168 hours.  Recent Results (from the past 240 hour(s))  Body fluid culture     Status: None   Collection Time: 08/09/19  4:54 PM   Specimen: Body Fluid  Result Value Ref Range Status   Specimen Description FLUID PLEURAL RIGHT  Final   Special Requests NONE  Final   Gram Stain   Final    ABUNDANT  WBC PRESENT, PREDOMINANTLY PMN MODERATE GRAM POSITIVE COCCI Performed at [facility], [address], [place], [place] [address]    Culture FEW METHICILLIN RESISTANT STAPHYLOCOCCUS AUREUS  Final   Report Status 08/12/2019 FINAL  Final   Organism ID, Bacteria METHICILLIN RESISTANT STAPHYLOCOCCUS AUREUS  Final      Susceptibility   Methicillin resistant staphylococcus aureus - MIC*    CIPROFLOXACIN >=8 RESISTANT Resistant     ERYTHROMYCIN >=8 RESISTANT Resistant     GENTAMICIN <=0.5 SENSITIVE Sensitive     OXACILLIN >=4 RESISTANT Resistant     TETRACYCLINE <=1 SENSITIVE Sensitive     VANCOMYCIN 1 SENSITIVE Sensitive     TRIMETH/SULFA <=10 SENSITIVE Sensitive     CLINDAMYCIN <=0.25 SENSITIVE Sensitive     RIFAMPIN <=0.5 SENSITIVE Sensitive     Inducible Clindamycin NEGATIVE Sensitive     * FEW METHICILLIN RESISTANT STAPHYLOCOCCUS AUREUS  Aerobic/Anaerobic Culture (surgical/deep wound)     Status: None   Collection Time: 08/11/19 10:35 AM   Specimen: Abscess  Result Value Ref Range Status   Specimen Description ABSCESS RIGHT  Final   Special Requests Normal  Final   Gram Stain   Final    ABUNDANT WBC PRESENT, PREDOMINANTLY PMN ABUNDANT GRAM POSITIVE COCCI    Culture   Final    ABUNDANT METHICILLIN RESISTANT STAPHYLOCOCCUS AUREUS NO ANAEROBES ISOLATED Performed at [facility], [address], [place], [place] [address]    Report Status 08/16/2019 FINAL  Final   Organism ID, Bacteria METHICILLIN RESISTANT STAPHYLOCOCCUS AUREUS  Final      Susceptibility   Methicillin resistant staphylococcus aureus - MIC*    CIPROFLOXACIN >=8 RESISTANT Resistant     ERYTHROMYCIN >=8 RESISTANT Resistant     GENTAMICIN <=0.5 SENSITIVE Sensitive     OXACILLIN >=4 RESISTANT Resistant     TETRACYCLINE <=1 SENSITIVE Sensitive     VANCOMYCIN 1 SENSITIVE Sensitive     TRIMETH/SULFA <=10 SENSITIVE Sensitive     CLINDAMYCIN <=0.25 SENSITIVE Sensitive     RIFAMPIN <=0.5 SENSITIVE  Sensitive      Inducible Clindamycin NEGATIVE Sensitive     * ABUNDANT METHICILLIN RESISTANT STAPHYLOCOCCUS AUREUS  Aerobic/Anaerobic Culture (surgical/deep wound)     Status: None   Collection Time: 08/11/19 10:38 AM   Specimen: Abscess  Result Value Ref Range Status   Specimen Description ABSCESS RIGHT GLUTEAL  Final   Special Requests Normal  Final   Gram Stain   Final    FEW WBC PRESENT, PREDOMINANTLY PMN NO ORGANISMS SEEN    Culture   Final    FEW METHICILLIN RESISTANT STAPHYLOCOCCUS AUREUS NO ANAEROBES ISOLATED Performed at [facility], [place] [address], [place], [place] [address]    Report Status 08/16/2019 FINAL  Final   Organism ID, Bacteria METHICILLIN RESISTANT STAPHYLOCOCCUS AUREUS  Final      Susceptibility   Methicillin resistant staphylococcus aureus - MIC*    CIPROFLOXACIN >=8 RESISTANT Resistant     ERYTHROMYCIN >=8 RESISTANT Resistant     GENTAMICIN <=0.5 SENSITIVE Sensitive     OXACILLIN >=4 RESISTANT Resistant     TETRACYCLINE <=1 SENSITIVE Sensitive     VANCOMYCIN 1 SENSITIVE Sensitive     TRIMETH/SULFA <=10 SENSITIVE Sensitive     CLINDAMYCIN <=0.25 SENSITIVE Sensitive     RIFAMPIN <=0.5 SENSITIVE Sensitive     Inducible Clindamycin NEGATIVE Sensitive     * FEW METHICILLIN RESISTANT STAPHYLOCOCCUS AUREUS     Studies: MR HIP RIGHT WO CONTRAST  Result Date: 08/16/2019 CLINICAL DATA:  Right iliopsoas abscess, myositis EXAM: MR OF THE RIGHT HIP WITHOUT CONTRAST TECHNIQUE: Multiplanar, multisequence MR imaging was performed. No intravenous contrast was administered. COMPARISON:  CT 08/10/2019. MRI 08/02/2019 FINDINGS: Technical note: Examination is markedly degraded by motion artifact and difficulties with patient positioning. Examination was ordered without and with intravenous contrast. Patient was unable to tolerate any postcontrast imaging. Bones/Joint/Cartilage Complex right sacroiliac joint effusion with extensive marrow edema within the adjacent right hemi  sacrum and right iliac bone (series 3, images 10-20). There is associated confluent low T1 signal at these sites compatible with acute osteomyelitis. Edema extends into the supra-acetabular aspect of the right iliac bone without extending to the subchondral portion of the bone. Overall the degree of osseous involvement has progressed compared to 08/02/2019. Right hip joint space is maintained. No fracture or dislocation. Trace joint effusion, similar in size to the prior study. Mild marrow signal changes along the inferior aspect of the left SI joint is favored degenerative/reactive. Remaining osseous structures appear within normal limits. No fracture or malalignment. Ligaments Unremarkable. Myotendinous Structures and Soft Tissues Extensive complex fluid collections again seen within the right iliopsoas musculature (series 3, images 18-21; series 6, images 1-6). Fluid collection extends distally along the course of the right iliopsoas myotendinous junction and closely approximates the tendon insertion at the lesser trochanter (series 6, images 21-22). Exact measurements of the collection are difficult to obtain given the complex multiloculated configuration. Approximate length of the collection is 23 cm. Overall volume of the fluid appears to of slightly decreased from prior. Partially visualized drainage catheter with posterior approach. Lobulated fluid collections are also seen extending posterolaterally from the inferior aspect of the right SI joint (series 6, images 1-12) extending into the right gluteus medius and maximus musculature, slightly increased from prior. Extensive myositis within the right gluteal and right piriformis musculature. Mild edema within the proximal adductor musculature without fluid collection. Fluid and edema tracks over the lateral and anterolateral aspect of the visualized proximal thigh with some edema also evident within  the proximal quadriceps musculature. IMPRESSION: 1. Septic  arthritis of the right SI joint with associated osteomyelitis of the right iliac bone and right sacrum. The degree of bony involvement has progressed from the comparison study 08/02/2019. 2. Extensive myositis and complex abscesses within the right iliopsoas and right gluteal musculature. The overall size of the iliopsoas abscesses appears slightly decreased from prior. Slight progression in the size of right gluteal intramuscular abscesses. 3. Trace right hip joint effusion, similar to prior. Septic arthritis not excluded. 4. Examination is motion degraded. Patient was unable to tolerate any postcontrast imaging. Best possible images were submitted for interpretation. Electronically Signed   By: Davina Poke D.O.   On: 08/16/2019 11:14    Scheduled Meds: . Chlorhexidine Gluconate Cloth  6 each Topical Daily  . docusate sodium  100 mg Oral BID  . DULoxetine  30 mg Oral Daily  . enoxaparin (LOVENOX) injection  40 mg Subcutaneous QHS  . feeding supplement (ENSURE ENLIVE)  237 mL Oral BID BM  . gabapentin  300 mg Oral TID  . polyethylene glycol  17 g Oral Daily  . QUEtiapine  150 mg Oral QHS  . sodium chloride flush  10-40 mL Intracatheter Q12H  . traZODone  150 mg Oral QHS    Continuous Infusions: . sodium chloride 250 mL (08/14/19 2013)  . vancomycin 1,250 mg (08/16/19 1234)     Time spent: 68mns I have personally reviewed and interpreted on  08/16/2019 daily labs, tele strips, imagings as discussed above under date review session and assessment and plans.  I reviewed all nursing notes, pharmacy notes, consultant notes,  vitals, pertinent old records  I have discussed plan of care as described above with RN , patient and family on 08/16/2019   FFlorencia ReasonsMD, PhD, FACP  [facility]  Available via Epic secure chat 7am-7pm for nonurgent issues Please page for urgent issues, pager number available through [email or web address] .   08/16/2019, 3:33 PM  LOS: 13 days

## 2019-08-16 NOTE — Progress Notes (Signed)
Pharmacy Antibiotic Note  Leslie Molina is a 34 y.o. female admitted on 08/03/2019 with MRSA bacteremia/abscess w/ osteo of saacrum and ischium. Patient continuing on vancomycin for disseminated MRSA infection.   Vancomycin levels collected today showed a peak of 34 and trough of 14 for a resultant AUC of 563, which is supratherapeutic. Both levels were collected appropriately. Scr is holding steady at 0.52.    Plan: Reduce Vancomycin to 1 gm Q 8 hours Predicted AUC 449  Will monitor renal function closely and repeat levels at steady state   Height: 5\' 1"  (154.9 cm) Weight: 143 lb 9.6 oz (65.1 kg) IBW/kg (Calculated) : 47.8  Temp (24hrs), Avg:99.1 F (37.3 C), Min:99.1 F (37.3 C), Max:99.1 F (37.3 C)  Recent Labs  Lab 08/11/19 0427 08/11/19 0427 08/12/19 1317 08/12/19 1317 08/13/19 0535 08/14/19 0647 08/15/19 0519 08/16/19 0807 08/16/19 1109 08/16/19 1504  WBC 10.7*  --  9.4  --  7.7 7.0  --   --   --   --   CREATININE 0.46   < > 0.46   < > 0.45 0.50 0.67 0.57 0.52  --   VANCOTROUGH  --   --   --   --   --   --   --   --  14*  --   VANCOPEAK  --   --   --   --   --   --   --   --   --  34   < > = values in this interval not displayed.    Estimated Creatinine Clearance: 85.6 mL/min (by C-G formula based on SCr of 0.52 mg/dL).    No Known Allergies  Antimicrobials this admission: 3/3 metronidazole x 1 3/3 vancomycin>>3/11 3/3 cefepime>>3/5 3/4 clindamycin >>3/5 3/11 Ceftaroline>>3/15 3/4 daptomycin 500mg  x 1; resume 3/11 >> 3/15  3/15 vancomycin >>  Microbiology results: 3/4 repeats: staph aureus 3/3BCx:GPC clusters 4/4 bottles (BCID = MRSA) 3/3 iliopsoas abscess:  MRSA  3/6 blood x2- neg 3/10 pleural fluid- staph aureus 3/12 wound culture - staph aureus   Thank you for allowing pharmacy to be a part of this patient's care.  Jimmy Footman, PharmD, BCPS, [place] [facility] Clinical Pharmacist Phone: [phone] 08/16/2019 3:54 PM

## 2019-08-16 NOTE — Progress Notes (Signed)
Ortho Trauma Note  Patient seen and examined.  I reviewed the MRI that was performed earlier today.  I discussed case with Dr. Linus Salmons of infectious disease.  Clinically she is improving with a lack of fever over the last few days.  She continues to complain of right hip pain.  She has her iliac a strain that is in place that is continuing to drain.  Her pain has not improved significantly according to the patient.  The MRI shows continue complex abscess around the iliopsoas and iliacus muscle.  The right SI joint does show some signs of septic arthritis with surrounding osteomyelitis.  Clinically I do not feel that the septic arthritis is warranted I would treat this as a osteomyelitis with prolonged IV antibiotics under the discretion of infectious disease.  The ultimate question about performing incision and drainage of her iliopsoas abscess.  She continues to drain through the IR drain that was placed.  As long as this continues to function and clinically she is doing well I do not see any role of formal incision and drainage.  I do not feel that subjecting the patient to a surgical intervention that would not add much to her clinical outcome is not of significant benefit.  I think any benefits and the pain that we would obtain from the surgery would be eliminated by the surgical pain that she would have and the risk of postoperative infection with that.  I also would not be able to access the iliacus portion of the abscess.  I would recommend continuing management as directed by infectious disease.  We will be available as needed if her clinical course deteriorates or there is any changes in regards to her clinical exam for her hip or pelvis.  I am happy to discuss as needed.  Shona Needles, MD [facility] [phone] (office) [email or web address]

## 2019-08-16 NOTE — Progress Notes (Signed)
[facility]  Date of Admission:  08/03/2019     Total days of antibiotics 15         ASSESSMENT:  Leslie Molina follow up MRI shows septic arthritis of the right SI joint and osteomyelitis of the right iliac bone and sacrum with extensive myositis and complex abscesses. Output of drain was 35cc over the last 24 hours. Has remained afebrile over the last couple of days. Source control remains a concern given her extensive disseminated infection with her hip/pelvis and right sided empyema. Requested orthopedic evaluation to determine any role for further surgical intervention. Continue to treat with vancomycin as renal function remains without evidence of nephrotoxicity.   PLAN:  1. Continue vancomycin.  2. Therapeutic monitoring of renal function and vancomycin levels. 3. Orthopedic evaluation to determine need for surgical intervention if present.  4. Pain management per primary team.   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 Chlorhexidine Gluconate Cloth  6 each Topical Daily  . docusate sodium  100 mg Oral BID  . DULoxetine  30 mg Oral Daily  . enoxaparin (LOVENOX) injection  40 mg Subcutaneous QHS  . feeding supplement (ENSURE ENLIVE)  237 mL Oral BID BM  . gabapentin  300 mg Oral TID  . polyethylene glycol  17 g Oral Daily  . QUEtiapine  150 mg Oral QHS  . sodium chloride flush  10-40 mL Intracatheter Q12H  . traZODone  150 mg Oral QHS    SUBJECTIVE:  Afebrile overnight with no acute events. Continues to have right sided hip and chest pain. IR drains are patent with output of 35 cc over the last 24 hours as documented by nursing. MRI repeated today with septic arthritis of the right SI joint and associated osteomyelitis of the  right iliac bone and sacrum; extensive myositis and complex abscesses with the right iliopsoas and right gluteal with slight decrease in iliopsoas compared to previous; and trace right hip joint effusion.  No Known Allergies   Review of Systems: Review of Systems  Constitutional: Negative for chills, fever and weight loss.  Respiratory: Negative for cough, shortness of breath and wheezing.   Cardiovascular: Negative for chest pain and leg swelling.  Gastrointestinal: Negative for abdominal pain, constipation, diarrhea, nausea and vomiting.  Musculoskeletal:       Positive for right hip pain.   Skin: Negative for rash.    OBJECTIVE: Vitals:   08/15/19 1336 08/15/19 1343 08/16/19 0656 08/16/19 0910  BP: 106/64  (!) 89/60 103/63  Pulse: 95 98 96   Resp:      Temp: 98 F (36.7 C)  99.1 F (37.3 C)   TempSrc:   Oral   SpO2: 98% 96% 98%   Weight:   65.1 kg   Height:       Body mass index is 27.13 kg/m.  Physical Exam Constitutional:      General: Leslie Molina is not in acute distress.    Appearance: Leslie Molina is well-developed.  Cardiovascular:     Rate and Rhythm: Normal rate and regular rhythm.     Heart sounds: Normal heart sounds.  Pulmonary:     Effort: Pulmonary effort is normal.  Breath sounds: Normal breath sounds.  Skin:    General: Skin is warm and dry.  Neurological:     Mental Status: Leslie Molina is alert and oriented to person, place, and time.  Psychiatric:        Mood and Affect: Mood normal.     Lab Results Lab Results  Component Value Date   WBC 7.0 08/14/2019   HGB 9.3 (L) 08/14/2019   HCT 30.2 (L) 08/14/2019   MCV 89.1 08/14/2019   PLT 647 (H) 08/14/2019    Lab Results  Component Value Date   CREATININE 0.57 08/16/2019   BUN 8 08/16/2019   NA 137 08/16/2019   K 4.3 08/16/2019   CL 97 (L) 08/16/2019   CO2 28 08/16/2019    Lab Results  Component Value Date   ALT 74 (H) 08/14/2019   AST 43 (H) 08/14/2019   ALKPHOS 149 (H) 08/14/2019   BILITOT 0.4  08/14/2019     Microbiology: Recent Results (from the past 240 hour(s))  Body fluid culture     Status: None   Collection Time: 08/09/19  4:54 PM   Specimen: Body Fluid  Result Value Ref Range Status   Specimen Description FLUID PLEURAL RIGHT  Final   Special Requests NONE  Final   Gram Stain   Final    ABUNDANT WBC PRESENT, PREDOMINANTLY PMN MODERATE GRAM POSITIVE COCCI Performed at [facility], [address] [place], [place] [address]    Culture FEW METHICILLIN RESISTANT STAPHYLOCOCCUS AUREUS  Final   Report Status 08/12/2019 FINAL  Final   Organism ID, Bacteria METHICILLIN RESISTANT STAPHYLOCOCCUS AUREUS  Final      Susceptibility   Methicillin resistant staphylococcus aureus - MIC*    CIPROFLOXACIN >=8 RESISTANT Resistant     ERYTHROMYCIN >=8 RESISTANT Resistant     GENTAMICIN <=0.5 SENSITIVE Sensitive     OXACILLIN >=4 RESISTANT Resistant     TETRACYCLINE <=1 SENSITIVE Sensitive     VANCOMYCIN 1 SENSITIVE Sensitive     TRIMETH/SULFA <=10 SENSITIVE Sensitive     CLINDAMYCIN <=0.25 SENSITIVE Sensitive     RIFAMPIN <=0.5 SENSITIVE Sensitive     Inducible Clindamycin NEGATIVE Sensitive     * FEW METHICILLIN RESISTANT STAPHYLOCOCCUS AUREUS  Aerobic/Anaerobic Culture (surgical/deep wound)     Status: None (Preliminary result)   Collection Time: 08/11/19 10:35 AM   Specimen: Abscess  Result Value Ref Range Status   Specimen Description ABSCESS RIGHT  Final   Special Requests Normal  Final   Gram Stain   Final    ABUNDANT WBC PRESENT, PREDOMINANTLY PMN ABUNDANT GRAM POSITIVE COCCI Performed at [facility], [address] [place], [place] [address]    Culture   Final    ABUNDANT METHICILLIN RESISTANT STAPHYLOCOCCUS AUREUS NO ANAEROBES ISOLATED; CULTURE IN PROGRESS FOR 5 DAYS    Report Status PENDING  Incomplete   Organism ID, Bacteria METHICILLIN RESISTANT STAPHYLOCOCCUS AUREUS  Final      Susceptibility   Methicillin resistant staphylococcus aureus -  MIC*    CIPROFLOXACIN >=8 RESISTANT Resistant     ERYTHROMYCIN >=8 RESISTANT Resistant     GENTAMICIN <=0.5 SENSITIVE Sensitive     OXACILLIN >=4 RESISTANT Resistant     TETRACYCLINE <=1 SENSITIVE Sensitive     VANCOMYCIN 1 SENSITIVE Sensitive     TRIMETH/SULFA <=10 SENSITIVE Sensitive     CLINDAMYCIN <=0.25 SENSITIVE Sensitive     RIFAMPIN <=0.5 SENSITIVE Sensitive     Inducible Clindamycin NEGATIVE Sensitive     *  ABUNDANT METHICILLIN RESISTANT STAPHYLOCOCCUS AUREUS  Aerobic/Anaerobic Culture (surgical/deep wound)     Status: None (Preliminary result)   Collection Time: 08/11/19 10:38 AM   Specimen: Abscess  Result Value Ref Range Status   Specimen Description ABSCESS RIGHT GLUTEAL  Final   Special Requests Normal  Final   Gram Stain   Final    FEW WBC PRESENT, PREDOMINANTLY PMN NO ORGANISMS SEEN Performed at [facility], [address], [place], [place] [address]    Culture   Final    FEW METHICILLIN RESISTANT STAPHYLOCOCCUS AUREUS NO ANAEROBES ISOLATED; CULTURE IN PROGRESS FOR 5 DAYS    Report Status PENDING  Incomplete   Organism ID, Bacteria METHICILLIN RESISTANT STAPHYLOCOCCUS AUREUS  Final      Susceptibility   Methicillin resistant staphylococcus aureus - MIC*    CIPROFLOXACIN >=8 RESISTANT Resistant     ERYTHROMYCIN >=8 RESISTANT Resistant     GENTAMICIN <=0.5 SENSITIVE Sensitive     OXACILLIN >=4 RESISTANT Resistant     TETRACYCLINE <=1 SENSITIVE Sensitive     VANCOMYCIN 1 SENSITIVE Sensitive     TRIMETH/SULFA <=10 SENSITIVE Sensitive     CLINDAMYCIN <=0.25 SENSITIVE Sensitive     RIFAMPIN <=0.5 SENSITIVE Sensitive     Inducible Clindamycin NEGATIVE Sensitive     * FEW METHICILLIN RESISTANT STAPHYLOCOCCUS AUREUS     Terri Piedra, NP [facility]   08/16/2019  11:01 AM

## 2019-08-16 NOTE — TOC Progression Note (Signed)
Transition of Care [facility]) - Progression Note    Patient Details  Name: Leslie Molina MRN: [id number] Date of Birth: [birth year]  Transition of Care [facility]) CM/SW [place], [place] Phone Number: 08/16/2019, 10:01 AM  Clinical Narrative:     CSW spoke with patient at bedside. CSW asked patient if she would like resources for Substance abuse outpatient treatment services.  Patient agreed that she did want the resource. CSW gave resource to patient.          Expected Discharge Plan and Services                                                 Social Determinants of Health (SDOH) Interventions    Readmission Risk Interventions No flowsheet data found.

## 2019-08-17 DIAGNOSIS — F1990 Other psychoactive substance use, unspecified, uncomplicated: Secondary | ICD-10-CM

## 2019-08-17 LAB — CK: Total CK: 18 U/L — ABNORMAL LOW (ref 38–234)

## 2019-08-17 LAB — BASIC METABOLIC PANEL
Anion gap: 10 (ref 5–15)
BUN: 7 mg/dL (ref 6–20)
CO2: 29 mmol/L (ref 22–32)
Calcium: 8.7 mg/dL — ABNORMAL LOW (ref 8.9–10.3)
Chloride: 97 mmol/L — ABNORMAL LOW (ref 98–111)
Creatinine, Ser: 0.55 mg/dL (ref 0.44–1.00)
GFR calc Af Amer: >60 mL/min (ref >60)
GFR calc non Af Amer: >60 mL/min (ref >60)
Glucose, Bld: 106 mg/dL — ABNORMAL HIGH (ref 70–99)
Potassium: 4.1 mmol/L (ref 3.5–5.1)
Sodium: 136 mmol/L (ref 135–145)

## 2019-08-17 LAB — URINALYSIS, ROUTINE W REFLEX MICROSCOPIC
Bilirubin Urine: NEGATIVE
Glucose, UA: NEGATIVE mg/dL
Hgb urine dipstick: NEGATIVE
Ketones, ur: NEGATIVE mg/dL
Nitrite: NEGATIVE
Protein, ur: NEGATIVE mg/dL
Specific Gravity, Urine: 1.011 (ref 1.005–1.030)
pH: 7 (ref 5.0–8.0)

## 2019-08-17 MED ORDER — ENSURE ENLIVE PO LIQD
237.0000 mL | Freq: Three times a day (TID) | ORAL | Status: DC
  Administered 2019-08-17 – 2019-09-02 (×44): 237 mL via ORAL

## 2019-08-17 NOTE — Progress Notes (Signed)
Nutrition Follow-up  DOCUMENTATION CODES:   Not applicable  INTERVENTION:  Ensure Enlive po TID, each supplement provides 350 kcal and 20 grams of protein   NUTRITION DIAGNOSIS:   Inadequate oral intake related to decreased appetite as evidenced by per patient/family report.  Ongoing.  GOAL:   Patient will meet greater than or equal to 90% of their needs  Progressing.   MONITOR:   PO intake, Labs, Supplement acceptance, Skin, Weight trends, I & O's  REASON FOR ASSESSMENT:   Malnutrition Screening Tool    ASSESSMENT:   3/12 MST, Pt with a PMH significant for bipolar disorder, anxiety and IV drug abuse presented with lower back pain and fever. Pt admitted with sepsis secondary to MRSA bacteremia and iliopsoas abscess/osteomyelitis of the sacrum/myositis and empyema. Pt also with Iliopsoas/gluteal abscess/osteomyelitis of the sacrum and possible infection of the right dorsum hand and right sternoclavicular joint infection  3/10 thoracentesis, 31mL purulent fluid aspirated 3/12 aspiration of gluteal abscess   Pt states appetite is good and that she is enjoying the Ensure Enlive.   PO Intake: 50-100% x last 8 recorded meals (78% average intake)  Labs reviewed. Medications reviewed and include: Ensure Enlive BID, Colace, Miralax, IV abx   UOP: 851ml x24 hours I/O: -13,578.29ml since admit  63ml output from RLQ drain x24 hours   Diet Order:   Diet Order            Diet Heart Room service appropriate? Yes; Fluid consistency: Thin  Diet effective now              EDUCATION NEEDS:   No education needs have been identified at this time  Skin:  Skin Assessment: Skin Integrity Issues: Skin Integrity Issues:: Other (Comment) Other: throat puncture  Last BM:  3/17  Height:   Ht Readings from Last 1 Encounters:  08/05/19 5\' 1"  (1.549 m)    Weight:   Wt Readings from Last 1 Encounters:  08/17/19 64.1 kg    BMI:  Body mass index is 26.7  kg/m.  Estimated Nutritional Needs:   Kcal:  1700-1900  Protein:  85-100 grams  Fluid:  >/= 1.7 L   Larkin Ina, MS, RD, LDN RD pager number and weekend/on-call pager number located in [facility].

## 2019-08-17 NOTE — Progress Notes (Signed)
Referring Physician(s): Dr Dillard Cannon   Supervising Physician: Sandi Mariscal  Patient Status:  [facility] - In-pt  Chief Complaint:  Right psoas abscess drain placed 08/12/19  Subjective:  Up in bed Less Rt hip pain- still sore Drain intact OP purulent - 10 cc in JP:  Organism ID, Bacteria METHICILLIN RESISTANT STAPHYLOCOCCUS AUREUS    Flushes easily  Allergies: Patient has no known allergies.  Medications: Prior to Admission medications   Medication Sig Start Date End Date Taking? Authorizing Provider  acetaminophen (TYLENOL) 325 MG tablet Take 2 tablets (650 mg total) by mouth every 6 (six) hours as needed for fever. 08/03/19  Yes Nolberto Hanlon, MD  ibuprofen (ADVIL) 200 MG tablet Take 200 mg by mouth every 6 (six) hours as needed for moderate pain.   Yes [provider]  ceFEPIme 2 g in sodium chloride 0.9 % 100 mL Inject 2 g into the vein every 8 (eight) hours. Patient not taking: Reported on 08/03/2019 08/03/19   Nolberto Hanlon, MD  Chlorhexidine Gluconate Cloth 2 % PADS Apply 6 each topically daily. Patient not taking: Reported on 08/03/2019 08/03/19   Nolberto Hanlon, MD  clindamycin (CLEOCIN) 900 MG/50ML IVPB Inject 50 mLs (900 mg total) into the vein every 8 (eight) hours. Patient not taking: Reported on 08/03/2019 08/03/19   Nolberto Hanlon, MD  Dextrose-Sodium Chloride (DEXTROSE 5 % AND 0.45% NACL) infusion Inject 150 mL/hr into the vein continuous. Patient not taking: Reported on 08/03/2019 08/03/19   Nolberto Hanlon, MD  enoxaparin (LOVENOX) 40 MG/0.4ML injection Inject 0.4 mLs (40 mg total) into the skin daily. Patient not taking: Reported on 08/03/2019 08/03/19   Nolberto Hanlon, MD  norepinephrine (LEVOPHED) 4-5 MG/250ML-% SOLN Inject 0-40 mcg/min into the vein continuous. Patient not taking: Reported on 08/03/2019 08/03/19   Nolberto Hanlon, MD  oxyCODONE (OXY IR/ROXICODONE) 5 MG immediate release tablet Take 1 tablet (5 mg total) by mouth every 6 (six) hours as needed for breakthrough pain. Patient  not taking: Reported on 08/03/2019 08/03/19   Nolberto Hanlon, MD  potassium chloride 10 MEQ/100ML Inject 100 mLs (10 mEq total) into the vein every 1 hour x 6 doses. Patient not taking: Reported on 08/03/2019 08/03/19   Nolberto Hanlon, MD  vancomycin (VANCOREADY) 750 MG/150ML SOLN Inject 150 mLs (750 mg total) into the vein every 8 (eight) hours. Patient not taking: Reported on 08/03/2019 08/03/19   Nolberto Hanlon, MD     Vital Signs: BP 93/60 (BP Location: Right Arm)   Pulse (!) 102   Temp 98.9 F (37.2 C) (Oral)   Resp 18   Ht 5\' 1"  (1.549 m)   Wt 141 lb 4.8 oz (64.1 kg)   LMP  (LMP Unknown)   SpO2 99%   BMI 26.70 kg/m   Physical Exam Skin:    General: Skin is warm and dry.     Comments: Site is clean and dry NT no bleeding Movement to roll in bed less painful  OP purulent 10 cc in JP 35 cc yesterday   Organism ID, Bacteria METHICILLIN RESISTANT STAPHYLOCOCCUS AUREUS         Imaging: MR HIP RIGHT WO CONTRAST  Result Date: 08/16/2019 CLINICAL DATA:  Right iliopsoas abscess, myositis EXAM: MR OF THE RIGHT HIP WITHOUT CONTRAST TECHNIQUE: Multiplanar, multisequence MR imaging was performed. No intravenous contrast was administered. COMPARISON:  CT 08/10/2019. MRI 08/02/2019 FINDINGS: Technical note: Examination is markedly degraded by motion artifact and difficulties with patient positioning. Examination was ordered without and with intravenous  contrast. Patient was unable to tolerate any postcontrast imaging. Bones/Joint/Cartilage Complex right sacroiliac joint effusion with extensive marrow edema within the adjacent right hemi sacrum and right iliac bone (series 3, images 10-20). There is associated confluent low T1 signal at these sites compatible with acute osteomyelitis. Edema extends into the supra-acetabular aspect of the right iliac bone without extending to the subchondral portion of the bone. Overall the degree of osseous involvement has progressed compared to 08/02/2019. Right hip  joint space is maintained. No fracture or dislocation. Trace joint effusion, similar in size to the prior study. Mild marrow signal changes along the inferior aspect of the left SI joint is favored degenerative/reactive. Remaining osseous structures appear within normal limits. No fracture or malalignment. Ligaments Unremarkable. Myotendinous Structures and Soft Tissues Extensive complex fluid collections again seen within the right iliopsoas musculature (series 3, images 18-21; series 6, images 1-6). Fluid collection extends distally along the course of the right iliopsoas myotendinous junction and closely approximates the tendon insertion at the lesser trochanter (series 6, images 21-22). Exact measurements of the collection are difficult to obtain given the complex multiloculated configuration. Approximate length of the collection is 23 cm. Overall volume of the fluid appears to of slightly decreased from prior. Partially visualized drainage catheter with posterior approach. Lobulated fluid collections are also seen extending posterolaterally from the inferior aspect of the right SI joint (series 6, images 1-12) extending into the right gluteus medius and maximus musculature, slightly increased from prior. Extensive myositis within the right gluteal and right piriformis musculature. Mild edema within the proximal adductor musculature without fluid collection. Fluid and edema tracks over the lateral and anterolateral aspect of the visualized proximal thigh with some edema also evident within the proximal quadriceps musculature. IMPRESSION: 1. 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 The degree of bony involvement has progressed from the comparison study 08/02/2019. 2. Extensive myositis and complex abscesses within the right iliopsoas and right gluteal musculature. The overall size of the iliopsoas abscesses appears slightly decreased from  prior. Slight progression in the size of right gluteal intramuscular abscesses. 3. Trace right hip joint effusion, similar to prior. Septic arthritis not excluded. 4. Examination is motion degraded. Patient was unable to tolerate any postcontrast imaging. Best possible images were submitted for interpretation. Electronically Signed   By: Davina Poke D.O.   On: 08/16/2019 11:14    Labs:  CBC: Recent Labs    08/11/19 0427 08/12/19 1317 08/13/19 0535 08/14/19 0647  WBC 10.7* 9.4 7.7 7.0  HGB 9.8* 9.5* 9.1* 9.3*  HCT 30.9* 29.3* 29.5* 30.2*  PLT 598* 617* 621* 647*    COAGS: Recent Labs    08/03/19 0250 08/11/19 0427  INR 1.3* 1.3*    BMP: Recent Labs    08/15/19 0519 08/16/19 0807 08/16/19 1109 08/17/19 0500  NA 135 137 136 136  K 4.3 4.3 3.6 4.1  CL 95* 97* 98 97*  CO2 28 28 27 29   GLUCOSE 105* 101* 144* 106*  BUN 8 8 8 7   CALCIUM 8.9 9.0 8.6* 8.7*  CREATININE 0.67 0.57 0.52 0.55  GFRNONAA >60 >60 >60 >60  GFRAA >60 >60 >60 >60    LIVER FUNCTION TESTS: Recent Labs    08/11/19 0427 08/12/19 1317 08/13/19 0535 08/14/19 0647  BILITOT 0.3 0.2* 0.3 0.4  AST 54* 25 56* 43*  ALT 74* 45* 69* 74*  ALKPHOS 233* 183* 164* 149*  PROT 6.8 6.9 6.6  7.0  ALBUMIN 2.0* 2.1* 2.0* 2.0*    Assessment and Plan:  Less painful Rt hip Drain of psoas abscess intact Will follow  Electronically Signed: Lavonia Drafts, PA-C 08/17/2019, 2:46 PM   I spent a total of 15 Minutes at the the patient's bedside AND on the patient's hospital floor or unit, greater than 50% of which was counseling/coordinating care for Rt psoas abscess drain

## 2019-08-17 NOTE — Progress Notes (Signed)
[place] for Infectious Disease  Date of Admission:  08/03/2019     Total days of antibiotics 16         ASSESSMENT:  Leslie Molina is stable and tolerating her vancomycin with no evidence of nephrotoxicity for treatment of disseminated MRSA infection. Drain output remains about 35 cc and continues to have hip and chest pain. Pain management continues to fluctuate. Orthopedics recommends continued medical treatment at this point. She continues to have a high burden of infection. Will continue current dose of vancomycin. Appreciate orthopedics and IR assistance. She will need to remain in the hospital for duration of her treatment given drug use.   PLAN:  1. Continue vancomycin. 2. Monitor vancomycin levels and renal function per protocol. 3. Drain management per IR.  4. Pain management per primary team.   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 Chlorhexidine Gluconate Cloth  6 each Topical Daily  . docusate sodium  100 mg Oral BID  . DULoxetine  30 mg Oral Daily  . enoxaparin (LOVENOX) injection  40 mg Subcutaneous QHS  . feeding supplement (ENSURE ENLIVE)  237 mL Oral BID BM  . gabapentin  300 mg Oral TID  . polyethylene glycol  17 g Oral Daily  . QUEtiapine  150 mg Oral QHS  . sodium chloride flush  10-40 mL Intracatheter Q12H  . traZODone  150 mg Oral QHS    SUBJECTIVE:  Afebrile overnight with no acute events. Orthopedics recommends continued medical treatment.   No Known Allergies   Review of Systems: Review of Systems  Constitutional: Negative for chills, fever and weight loss.  Respiratory: Negative for cough, shortness of breath and wheezing.   Cardiovascular: Positive for chest pain. Negative for leg swelling.  Gastrointestinal:  Negative for abdominal pain, constipation, diarrhea, nausea and vomiting.  Musculoskeletal:       Positive for right hip / pelvis pain  Skin: Negative for rash.      OBJECTIVE: Vitals:   08/16/19 1751 08/16/19 2045 08/17/19 0342 08/17/19 0806  BP:  98/63 97/67 93/60   Pulse:  (!) 102 (!) 111 (!) 102  Resp:  16 18 18   Temp: 99.1 F (37.3 C) 98.8 F (37.1 C) 99.3 F (37.4 C) 98.9 F (37.2 C)  TempSrc: Oral Oral Oral Oral  SpO2:  96% 96% 99%  Weight:   64.1 kg   Height:       Body mass index is 26.7 kg/m.  Physical Exam Constitutional:      General: She is not in acute distress.    Appearance: She is well-developed.     Comments: Lying in bed with head of bed elevated; pleasant.   Cardiovascular:     Rate and Rhythm: Normal rate and regular rhythm.     Heart sounds: Normal heart sounds.  Pulmonary:     Effort: Pulmonary effort is normal.     Breath sounds: Normal breath sounds.  Musculoskeletal:     Comments: JP drain patent and charged with purulent drainage.   Skin:    General: Skin is warm and dry.  Neurological:     Mental Status: She is alert and oriented to person, place, and time.  Psychiatric:  Mood and Affect: Mood normal.     Lab Results Lab Results  Component Value Date   WBC 7.0 08/14/2019   HGB 9.3 (L) 08/14/2019   HCT 30.2 (L) 08/14/2019   MCV 89.1 08/14/2019   PLT 647 (H) 08/14/2019    Lab Results  Component Value Date   CREATININE 0.55 08/17/2019   BUN 7 08/17/2019   NA 136 08/17/2019   K 4.1 08/17/2019   CL 97 (L) 08/17/2019   CO2 29 08/17/2019    Lab Results  Component Value Date   ALT 74 (H) 08/14/2019   AST 43 (H) 08/14/2019   ALKPHOS 149 (H) 08/14/2019   BILITOT 0.4 08/14/2019     Microbiology: Recent Results (from the past 240 hour(s))  Body fluid culture     Status: None   Collection Time: 08/09/19  4:54 PM   Specimen: Body Fluid  Result Value Ref Range Status   Specimen Description FLUID PLEURAL RIGHT  Final    Special Requests NONE  Final   Gram Stain   Final    ABUNDANT WBC PRESENT, PREDOMINANTLY PMN MODERATE GRAM POSITIVE COCCI Performed at [facility], [address], [place], [place] [address]    Culture FEW METHICILLIN RESISTANT STAPHYLOCOCCUS AUREUS  Final   Report Status 08/12/2019 FINAL  Final   Organism ID, Bacteria METHICILLIN RESISTANT STAPHYLOCOCCUS AUREUS  Final      Susceptibility   Methicillin resistant staphylococcus aureus - MIC*    CIPROFLOXACIN >=8 RESISTANT Resistant     ERYTHROMYCIN >=8 RESISTANT Resistant     GENTAMICIN <=0.5 SENSITIVE Sensitive     OXACILLIN >=4 RESISTANT Resistant     TETRACYCLINE <=1 SENSITIVE Sensitive     VANCOMYCIN 1 SENSITIVE Sensitive     TRIMETH/SULFA <=10 SENSITIVE Sensitive     CLINDAMYCIN <=0.25 SENSITIVE Sensitive     RIFAMPIN <=0.5 SENSITIVE Sensitive     Inducible Clindamycin NEGATIVE Sensitive     * FEW METHICILLIN RESISTANT STAPHYLOCOCCUS AUREUS  Aerobic/Anaerobic Culture (surgical/deep wound)     Status: None   Collection Time: 08/11/19 10:35 AM   Specimen: Abscess  Result Value Ref Range Status   Specimen Description ABSCESS RIGHT  Final   Special Requests Normal  Final   Gram Stain   Final    ABUNDANT WBC PRESENT, PREDOMINANTLY PMN ABUNDANT GRAM POSITIVE COCCI    Culture   Final    ABUNDANT METHICILLIN RESISTANT STAPHYLOCOCCUS AUREUS NO ANAEROBES ISOLATED Performed at [facility], [address] [place], [place] [address]    Report Status 08/16/2019 FINAL  Final   Organism ID, Bacteria METHICILLIN RESISTANT STAPHYLOCOCCUS AUREUS  Final      Susceptibility   Methicillin resistant staphylococcus aureus - MIC*    CIPROFLOXACIN >=8 RESISTANT Resistant     ERYTHROMYCIN >=8 RESISTANT Resistant     GENTAMICIN <=0.5 SENSITIVE Sensitive     OXACILLIN >=4 RESISTANT Resistant     TETRACYCLINE <=1 SENSITIVE Sensitive     VANCOMYCIN 1 SENSITIVE Sensitive     TRIMETH/SULFA <=10 SENSITIVE Sensitive     CLINDAMYCIN  <=0.25 SENSITIVE Sensitive     RIFAMPIN <=0.5 SENSITIVE Sensitive     Inducible Clindamycin NEGATIVE Sensitive     * ABUNDANT METHICILLIN RESISTANT STAPHYLOCOCCUS AUREUS  Aerobic/Anaerobic Culture (surgical/deep wound)     Status: None   Collection Time: 08/11/19 10:38 AM   Specimen: Abscess  Result Value Ref Range Status   Specimen Description ABSCESS RIGHT GLUTEAL  Final   Special Requests Normal  Final   Gram Stain   Final    FEW WBC PRESENT, PREDOMINANTLY PMN NO ORGANISMS SEEN    Culture   Final    FEW METHICILLIN RESISTANT STAPHYLOCOCCUS AUREUS NO ANAEROBES ISOLATED Performed at [facility], [address] [place], [place] [address]    Report Status 08/16/2019 FINAL  Final   Organism ID, Bacteria METHICILLIN RESISTANT STAPHYLOCOCCUS AUREUS  Final      Susceptibility   Methicillin resistant staphylococcus aureus - MIC*    CIPROFLOXACIN >=8 RESISTANT Resistant     ERYTHROMYCIN >=8 RESISTANT Resistant     GENTAMICIN <=0.5 SENSITIVE Sensitive     OXACILLIN >=4 RESISTANT Resistant     TETRACYCLINE <=1 SENSITIVE Sensitive     VANCOMYCIN 1 SENSITIVE Sensitive     TRIMETH/SULFA <=10 SENSITIVE Sensitive     CLINDAMYCIN <=0.25 SENSITIVE Sensitive     RIFAMPIN <=0.5 SENSITIVE Sensitive     Inducible Clindamycin NEGATIVE Sensitive     * FEW METHICILLIN RESISTANT STAPHYLOCOCCUS AUREUS     Terri Piedra, NP [place] Infectious Disease [facility] [phone] Pager  08/17/2019  1:39 PM

## 2019-08-17 NOTE — Progress Notes (Signed)
PROGRESS NOTE  Leslie Molina ZOX:[id number] DOB: Apr 24, 1986 DOA: 08/03/2019 PCP: [facility]  Brief summary:  Leslie Molina is an 34 y.o. 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  The abscess extends to the posterior aspect of the proximal right thigh.  Culture data: COVID 3/3 >negative  MRSA PCR 3/3 >Positive  Blood culture 3/3 >MRSA  Pelvic abcess culture 3/3 >Abundant Staph aures, full result pending.  Antibiotic regimen: Daptomycin 3/3 once Cefepime 3/3-3/4 Clindamycin 3/3 once Vancomycin 3/3>>  Procedures: MRI of the right hip on 08/02/2019: Extensive right iliopsoas abscess extending from at least the L3-4 level to the insertion of the iliopsoas tendon on the lesser trochanter. 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  Myositis of the right gluteus minimus, medius, and maximus and right piriformis muscles. 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  Abscess extends through the posterior aspect of the right SI joint into the gluteal musculature. Fluid along the fascial planes in the anterior aspect of the proximal right thigh likely representing infectious fasciitis.  MRI of the lumbar spine on 08/03/2019: Septic right sacroiliac joint with associated osteomyelitis of the right sacral ala.  Extensive right iliopsoas abscess.  Edema and enhancement of the right gluteal muscles and right ultifidus muscle at the L5-S1 level consistent with myositis. No evidence of epidural extension of abscess at this time.  CT of the neck on 08/06/2019 showed multiple peripheral predominant nodules at the lung apices suspicious for septic  emboli and a right empyema with inflammation and stranding on the right retroclavicular region and right lower neck consistent with soft tissue infection.  IR was consulted who performe ultrasound-guided thoracentesis on 08/09/2019, aspirated 14 mL of purulent material.  Culture grew MRSA.  CT of the abdomen and pelvis 08/10/2019 showed Continued significant enlargement of right ileo psoas muscle with multiple irregular and peripherally enhancing low densities throughout its course, most consistent with abscesses. Probable abscesses are also noted in the right iliacus muscle as well as in the right gluteus medius muscle.  IR was reconsulted to perform gluteal abscess and right iliopsoas abscess needle aspiration on 08/11/2019 see below for further details.      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 Judee Clara input appreciated, medical management for now possible infection of the right dorsum hand and right sternoclavicular joint infection Right chest empyema, thoracic surgery was initially consulted, thoracic surgery recommended IR consult due to lack of accessible fluid collection IR evaluated patient, s/p ultrasound guided thoracentesis with removal of 13 cc of purulent fluid on March 10, collection was too small the time no chest tube placement recommended. -S/p  a right psoas drain on 3.12.21 by IR, Recommend team continue with flushing TID, output recording q shift and dressing changes as  needed. Would consider additional imaging when output is less than 10 ml for 24 hours not including flush material.  -IV vancomycin ID and thoracic surgery following Orthopedic consulted by ID  Cloudy urine with burning sensation - UA and urine culture ordered  Peritonitis/IUD perforation: CT scan of the abdomen her abdominal exam is benign. Gynecology and general surgery was consulted and they have signed off. They recommended no surgical intervention at this time.  Bipolar disorder/anxiety/depression: Home medications Cymbalta, Seroquel trazodone were held.  Hypokalemia, replaced, monitor   Thrombocytosis Likely reactive, monitor  Normocytic anemia, in the setting of acute illness Hemoglobin has been stable about 9 the last few days Monitor  Previously documented narcotic seeking behavior, please refer notes on March 16 by Dr. Aileen Fass Monitor  H/o IVDU -She reports interested in starting Suboxone when her pain is getting better in the next few days, currently she want to keep the current pain regimen -She is thinking about eventually going away from [place] to [place] to live with her dad.  Cigarette smoking  DVT Prophylaxis: Lovenox  Code Status: Full  Family Communication: Family at bedside with patient permission  Disposition Plan:    Patient came from:                   Transferred from [facility]                                                                                        Anticipated d/c place:  TBD  Barriers to d/c OR conditions which need to be met to effect a safe d/c:  Not ready to discharge   Consultants:  ID  Orthopedic  IR  Critical care signed off  Wound care  General surgery signed off  Gyn signed off  Thoracic surgery  Procedures:  Ultrasound guided right-sided thoracentesis on March 10 by IR  a right psoas drain on 3.12.21  PICC line placement  Antibiotics:  IV vancomycin   Objective: BP 93/60 (BP Location:  Right Arm)   Pulse (!) 102   Temp 98.9 F (37.2 C) (Oral)   Resp 18   Ht 5' 1"  (1.549 m)   Wt 64.1 kg   LMP  (LMP Unknown)   SpO2 99%   BMI 26.70 kg/m   Intake/Output Summary (Last 24 hours) at 08/17/2019 1131 Last data filed at 08/17/2019 0923 Gross per 24 hour  Intake 796.67 ml  Output 1335 ml  Net -538.33 ml   Filed Weights   08/15/19 0458 08/16/19 0656 08/17/19 0342  Weight: 66.5 kg 65.1 kg 64.1 kg    Exam: Patient is examined daily including today on 08/17/2019, exams remain the same as of yesterday except that has changed    General:  NAD, picc line left arm  Cardiovascular: RRR  Respiratory: CTABL  Abdomen: Soft/ND/NT, positive BS  Musculoskeletal: No Edema  Neuro: alert, oriented   Skin: Psoriasis changes in lower extremity  Data Reviewed: Basic Metabolic Panel: Recent Labs  Lab 08/14/19 0647 08/15/19 0519  08/16/19 0807 08/16/19 1109 08/17/19 0500  NA 139 135 137 136 136  K 4.0 4.3 4.3 3.6 4.1  CL 99 95* 97* 98 97*  CO2 26 28 28 27 29   GLUCOSE 96 105* 101* 144* 106*  BUN 5* 8 8 8 7   CREATININE 0.50 0.67 0.57 0.52 0.55  CALCIUM 9.0 8.9 9.0 8.6* 8.7*   Liver Function Tests: Recent Labs  Lab 08/11/19 0427 08/12/19 1317 08/13/19 0535 08/14/19 0647  AST 54* 25 56* 43*  ALT 74* 45* 69* 74*  ALKPHOS 233* 183* 164* 149*  BILITOT 0.3 0.2* 0.3 0.4  PROT 6.8 6.9 6.6 7.0  ALBUMIN 2.0* 2.1* 2.0* 2.0*   No results for input(s): LIPASE, AMYLASE in the last 168 hours. No results for input(s): AMMONIA in the last 168 hours. CBC: Recent Labs  Lab 08/11/19 0427 08/12/19 1317 08/13/19 0535 08/14/19 0647  WBC 10.7* 9.4 7.7 7.0  NEUTROABS 8.3*  --   --   --   HGB 9.8* 9.5* 9.1* 9.3*  HCT 30.9* 29.3* 29.5* 30.2*  MCV 86.6 84.9 87.3 89.1  PLT 598* 617* 621* 647*   Cardiac Enzymes:   Recent Labs  Lab 08/10/19 1944 08/17/19 0500  CKTOTAL 37* 18*   BNP (last 3 results) No results for input(s): BNP in the last 8760 hours.  ProBNP (last 3  results) No results for input(s): PROBNP in the last 8760 hours.  CBG: No results for input(s): GLUCAP in the last 168 hours.  Recent Results (from the past 240 hour(s))  Body fluid culture     Status: None   Collection Time: 08/09/19  4:54 PM   Specimen: Body Fluid  Result Value Ref Range Status   Specimen Description FLUID PLEURAL RIGHT  Final   Special Requests NONE  Final   Gram Stain   Final    ABUNDANT WBC PRESENT, PREDOMINANTLY PMN MODERATE GRAM POSITIVE COCCI Performed at [facility], [address] [place], [place] [address]    Culture FEW METHICILLIN RESISTANT STAPHYLOCOCCUS AUREUS  Final   Report Status 08/12/2019 FINAL  Final   Organism ID, Bacteria METHICILLIN RESISTANT STAPHYLOCOCCUS AUREUS  Final      Susceptibility   Methicillin resistant staphylococcus aureus - MIC*    CIPROFLOXACIN >=8 RESISTANT Resistant     ERYTHROMYCIN >=8 RESISTANT Resistant     GENTAMICIN <=0.5 SENSITIVE Sensitive     OXACILLIN >=4 RESISTANT Resistant     TETRACYCLINE <=1 SENSITIVE Sensitive     VANCOMYCIN 1 SENSITIVE Sensitive     TRIMETH/SULFA <=10 SENSITIVE Sensitive     CLINDAMYCIN <=0.25 SENSITIVE Sensitive     RIFAMPIN <=0.5 SENSITIVE Sensitive     Inducible Clindamycin NEGATIVE Sensitive     * FEW METHICILLIN RESISTANT STAPHYLOCOCCUS AUREUS  Aerobic/Anaerobic Culture (surgical/deep wound)     Status: None   Collection Time: 08/11/19 10:35 AM   Specimen: Abscess  Result Value Ref Range Status   Specimen Description ABSCESS RIGHT  Final   Special Requests Normal  Final   Gram Stain   Final    ABUNDANT WBC PRESENT, PREDOMINANTLY PMN ABUNDANT GRAM POSITIVE COCCI    Culture   Final    ABUNDANT METHICILLIN RESISTANT STAPHYLOCOCCUS AUREUS NO ANAEROBES ISOLATED Performed at [facility], [address], [place], [place] [address]    Report Status 08/16/2019 FINAL  Final   Organism ID, Bacteria METHICILLIN RESISTANT STAPHYLOCOCCUS AUREUS  Final      Susceptibility     Methicillin resistant staphylococcus aureus -  MIC*    CIPROFLOXACIN >=8 RESISTANT Resistant     ERYTHROMYCIN >=8 RESISTANT Resistant     GENTAMICIN <=0.5 SENSITIVE Sensitive     OXACILLIN >=4 RESISTANT Resistant     TETRACYCLINE <=1 SENSITIVE Sensitive     VANCOMYCIN 1 SENSITIVE Sensitive     TRIMETH/SULFA <=10 SENSITIVE Sensitive     CLINDAMYCIN <=0.25 SENSITIVE Sensitive     RIFAMPIN <=0.5 SENSITIVE Sensitive     Inducible Clindamycin NEGATIVE Sensitive     * ABUNDANT METHICILLIN RESISTANT STAPHYLOCOCCUS AUREUS  Aerobic/Anaerobic Culture (surgical/deep wound)     Status: None   Collection Time: 08/11/19 10:38 AM   Specimen: Abscess  Result Value Ref Range Status   Specimen Description ABSCESS RIGHT GLUTEAL  Final   Special Requests Normal  Final   Gram Stain   Final    FEW WBC PRESENT, PREDOMINANTLY PMN NO ORGANISMS SEEN    Culture   Final    FEW METHICILLIN RESISTANT STAPHYLOCOCCUS AUREUS NO ANAEROBES ISOLATED Performed at [facility], [place] [address] [place], [place] [address]    Report Status 08/16/2019 FINAL  Final   Organism ID, Bacteria METHICILLIN RESISTANT STAPHYLOCOCCUS AUREUS  Final      Susceptibility   Methicillin resistant staphylococcus aureus - MIC*    CIPROFLOXACIN >=8 RESISTANT Resistant     ERYTHROMYCIN >=8 RESISTANT Resistant     GENTAMICIN <=0.5 SENSITIVE Sensitive     OXACILLIN >=4 RESISTANT Resistant     TETRACYCLINE <=1 SENSITIVE Sensitive     VANCOMYCIN 1 SENSITIVE Sensitive     TRIMETH/SULFA <=10 SENSITIVE Sensitive     CLINDAMYCIN <=0.25 SENSITIVE Sensitive     RIFAMPIN <=0.5 SENSITIVE Sensitive     Inducible Clindamycin NEGATIVE Sensitive     * FEW METHICILLIN RESISTANT STAPHYLOCOCCUS AUREUS     Studies: No results found.  Scheduled Meds: . Chlorhexidine Gluconate Cloth  6 each Topical Daily  . docusate sodium  100 mg Oral BID  . DULoxetine  30 mg Oral Daily  . enoxaparin (LOVENOX) injection  40 mg Subcutaneous QHS  . feeding  supplement (ENSURE ENLIVE)  237 mL Oral BID BM  . gabapentin  300 mg Oral TID  . polyethylene glycol  17 g Oral Daily  . QUEtiapine  150 mg Oral QHS  . sodium chloride flush  10-40 mL Intracatheter Q12H  . traZODone  150 mg Oral QHS    Continuous Infusions: . sodium chloride 250 mL (08/17/19 1131)  . vancomycin Stopped (08/17/19 0449)     Time spent: 32mns I have personally reviewed and interpreted on  08/17/2019 daily labs, tele strips, imagings as discussed above under date review session and assessment and plans.  I reviewed all nursing notes, pharmacy notes, consultant notes,  vitals, pertinent old records  I have discussed plan of care as described above with RN , patient on 08/17/2019   FFlorencia ReasonsMD, PhD, FACP  [facility]  Available via Epic secure chat 7am-7pm for nonurgent issues Please page for urgent issues, pager number available through [email or web address] .   08/17/2019, 11:31 AM  LOS: 14 days

## 2019-08-18 LAB — URINE CULTURE: Culture: NO GROWTH

## 2019-08-18 LAB — HEPATIC FUNCTION PANEL
ALT: 45 U/L — ABNORMAL HIGH (ref 0–44)
AST: 21 U/L (ref 15–41)
Albumin: 2.2 g/dL — ABNORMAL LOW (ref 3.5–5.0)
Alkaline Phosphatase: 113 U/L (ref 38–126)
Bilirubin, Direct: <0.1 mg/dL (ref 0.0–0.2)
Total Bilirubin: 0.4 mg/dL (ref 0.3–1.2)
Total Protein: 7 g/dL (ref 6.5–8.1)

## 2019-08-18 LAB — CBC WITH DIFFERENTIAL/PLATELET
Abs Immature Granulocytes: 0.04 10*3/uL (ref 0.00–0.07)
Basophils Absolute: 0.1 10*3/uL (ref 0.0–0.1)
Basophils Relative: 1 %
Eosinophils Absolute: 0.1 10*3/uL (ref 0.0–0.5)
Eosinophils Relative: 2 %
HCT: 28.6 % — ABNORMAL LOW (ref 36.0–46.0)
Hemoglobin: 9.2 g/dL — ABNORMAL LOW (ref 12.0–15.0)
Immature Granulocytes: 1 %
Lymphocytes Relative: 28 %
Lymphs Abs: 1.4 10*3/uL (ref 0.7–4.0)
MCH: 27.4 pg (ref 26.0–34.0)
MCHC: 32.2 g/dL (ref 30.0–36.0)
MCV: 85.1 fL (ref 80.0–100.0)
Monocytes Absolute: 0.5 10*3/uL (ref 0.1–1.0)
Monocytes Relative: 10 %
Neutro Abs: 2.9 10*3/uL (ref 1.7–7.7)
Neutrophils Relative %: 58 %
Platelets: 443 10*3/uL — ABNORMAL HIGH (ref 150–400)
RBC: 3.36 MIL/uL — ABNORMAL LOW (ref 3.87–5.11)
RDW: 13.8 % (ref 11.5–15.5)
WBC: 5 10*3/uL (ref 4.0–10.5)
nRBC: 0 % (ref 0.0–0.2)

## 2019-08-18 LAB — BASIC METABOLIC PANEL
Anion gap: 9 (ref 5–15)
BUN: 8 mg/dL (ref 6–20)
CO2: 30 mmol/L (ref 22–32)
Calcium: 8.8 mg/dL — ABNORMAL LOW (ref 8.9–10.3)
Chloride: 98 mmol/L (ref 98–111)
Creatinine, Ser: 0.54 mg/dL (ref 0.44–1.00)
GFR calc Af Amer: >60 mL/min (ref >60)
GFR calc non Af Amer: >60 mL/min (ref >60)
Glucose, Bld: 106 mg/dL — ABNORMAL HIGH (ref 70–99)
Potassium: 4.3 mmol/L (ref 3.5–5.1)
Sodium: 137 mmol/L (ref 135–145)

## 2019-08-18 LAB — MAGNESIUM: Magnesium: 2 mg/dL (ref 1.7–2.4)

## 2019-08-18 LAB — VANCOMYCIN, PEAK: Vancomycin Pk: 34 ug/mL (ref 30–40)

## 2019-08-18 LAB — TSH: TSH: 1.255 u[IU]/mL (ref 0.350–4.500)

## 2019-08-18 LAB — VANCOMYCIN, TROUGH: Vancomycin Tr: 11 ug/mL — ABNORMAL LOW (ref 15–20)

## 2019-08-18 MED ORDER — LIDOCAINE 5 % EX PTCH
1.0000 | MEDICATED_PATCH | CUTANEOUS | Status: DC
  Administered 2019-08-18 – 2019-08-19 (×2): 1 via TRANSDERMAL
  Filled 2019-08-18 (×3): qty 1

## 2019-08-18 MED ORDER — FENTANYL CITRATE (PF) 100 MCG/2ML IJ SOLN
12.5000 ug | INTRAMUSCULAR | Status: AC | PRN
  Administered 2019-08-18 – 2019-08-20 (×8): 12.5 ug via INTRAVENOUS
  Filled 2019-08-18 (×8): qty 2

## 2019-08-18 NOTE — Progress Notes (Signed)
Dr. Erlinda Hong text paged x3 regarding pt continuing c/o pain all over her. Pt is requesting for a stronger pain medication on a regular basis. Had spoken to pt's mother x 2  And had indicated that MD is being notified of her continuing pain complaints. Dr. Erlinda Hong in to see pt at this time.

## 2019-08-18 NOTE — Progress Notes (Signed)
PROGRESS NOTE  Leslie Molina KMQ:[id number] DOB: 01-29-1986 DOA: 08/03/2019 PCP: [facility]  Brief summary:  Leslie Molina is an 34 y.o. 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  The abscess extends to the posterior aspect of the proximal right thigh.  Culture data: COVID 3/3 >negative  MRSA PCR 3/3 >Positive  Blood culture 3/3 >MRSA  Pelvic abcess culture 3/3 >Abundant Staph aures, full result pending.  Antibiotic regimen: Daptomycin 3/3 once Cefepime 3/3-3/4 Clindamycin 3/3 once Vancomycin 3/3>>  Procedures: MRI of the right hip on 08/02/2019: Extensive right iliopsoas abscess extending from at least the L3-4 level to the insertion of the iliopsoas tendon on the lesser trochanter. 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  Myositis of the right gluteus minimus, medius, and maximus and right piriformis muscles. 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  Abscess extends through the posterior aspect of the right SI joint into the gluteal musculature. Fluid along the fascial planes in the anterior aspect of the proximal right thigh likely representing infectious fasciitis.  MRI of the lumbar spine on 08/03/2019: Septic right sacroiliac joint with associated osteomyelitis of the right sacral ala.  Extensive right iliopsoas abscess.  Edema and enhancement of the right gluteal muscles and right ultifidus muscle at the L5-S1 level consistent with myositis. No evidence of epidural extension of abscess at this time.  CT of the neck on 08/06/2019 showed multiple peripheral predominant nodules at the lung apices suspicious for septic  emboli and a right empyema with inflammation and stranding on the right retroclavicular region and right lower neck consistent with soft tissue infection.  IR was consulted who performe ultrasound-guided thoracentesis on 08/09/2019, aspirated 14 mL of purulent material.  Culture grew MRSA.  CT of the abdomen and pelvis 08/10/2019 showed Continued significant enlargement of right ileo psoas muscle with multiple irregular and peripherally enhancing low densities throughout its course, most consistent with abscesses. Probable abscesses are also noted in the right iliacus muscle as well as in the right gluteus medius muscle.  IR was reconsulted to perform gluteal abscess and right iliopsoas abscess needle aspiration on 08/11/2019 see below for further details.      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 Judee Clara input appreciated, medical management for now possible infection of the right dorsum hand and right sternoclavicular joint infection Right chest empyema, thoracic surgery was initially consulted, thoracic surgery recommended IR consult due to lack of accessible fluid collection IR evaluated patient, s/p ultrasound guided thoracentesis with removal  of 13 cc of purulent fluid on March 10, collection was too small the time no chest tube placement recommended. -S/p a right psoas drain on  3.12.21 by IR, Recommend team continue with flushing TID, output recording q shift and dressing changes as needed. Would consider additional imaging when output is less than 10 ml for 24 hours not including flush material.  -IV vancomycin ID and thoracic surgery following Orthopedic consulted by ID  Cloudy urine with burning sensation -  urine culture no growth -encourage oral intake  Peritonitis/IUD perforation: CT scan of the abdomen her abdominal exam is benign. Gynecology and general surgery was consulted and they have signed off. They recommended no surgical intervention at this time.  Bipolar disorder/anxiety/depression: Home medications Cymbalta, Seroquel trazodone were held.  Hypokalemia, replaced, monitor   Thrombocytosis Likely reactive, improving  Normocytic anemia, in the setting of acute illness Hemoglobin has been stable about 9 the last few days Monitor  Previously documented narcotic seeking behavior, please refer notes on March 16 by Dr. Aileen Fass Monitor  H/o IVDU -She reports interested in starting Suboxone when her pain is getting better in the next few days, currently she want to keep the current pain regimen -She is thinking about eventually going away from [place] to [place] to live with her dad.  Cigarette smoking  DVT Prophylaxis: Lovenox  Code Status: Full  Family Communication: Family (mother and daughter) at bedside with patient permission on March 17  Disposition Plan:    Patient came from:                   Transferred from [facility]                                                                                        Anticipated d/c place:  TBD  Barriers to d/c OR conditions which need to be met to effect a safe d/c:  Not ready to discharge, need to finish IV antibiotic treatment in-house   Consultants:  ID  Orthopedic  IR  Critical care signed off  Wound care  General surgery signed off  Gyn signed off  Thoracic  surgery  Procedures:  Ultrasound guided right-sided thoracentesis on March 10 by IR  a right psoas drain on 3.12.21  PICC line placement  Antibiotics:  IV vancomycin   Objective: BP (!) 89/57 (BP Location: Right Arm)   Pulse (!) 109   Temp 99.7 F (37.6 C) (Oral)   Resp 14   Ht 5' 1"  (1.549 m)   Wt 64.4 kg   LMP  (LMP Unknown)   SpO2 95%   BMI 26.83 kg/m   Intake/Output Summary (Last 24 hours) at 08/18/2019 1313 Last data filed at 08/18/2019 0300 Gross per 24 hour  Intake 1187.66 ml  Output 310 ml  Net 877.66 ml   Filed Weights   08/16/19 0656 08/17/19 0342 08/18/19 0416  Weight: 65.1 kg 64.1 kg 64.4 kg    Exam: Patient is examined daily including today on 08/18/2019, exams remain the same as of yesterday except that has changed    General:  NAD, picc line left arm  Cardiovascular: RRR  Respiratory: CTABL  Abdomen: Soft/ND/NT, positive BS  Musculoskeletal: No Edema, right psoas drain with purulent drainage  Neuro: alert, oriented   Skin: Psoriasis changes in lower extremity  Data Reviewed: Basic Metabolic Panel: Recent Labs  Lab 08/15/19 0519 08/16/19 0807 08/16/19 1109 08/17/19 0500 08/18/19 0620  NA 135 137 136 136 137  K 4.3 4.3 3.6 4.1 4.3  CL 95* 97* 98 97* 98  CO2 28 28 27 29 30   GLUCOSE 105* 101* 144* 106* 106*  BUN 8 8 8 7 8   CREATININE 0.67 0.57 0.52 0.55 0.54  CALCIUM 8.9 9.0 8.6* 8.7* 8.8*  MG  --   --   --   --  2.0   Liver Function Tests: Recent Labs  Lab 08/12/19 1317 08/13/19 0535 08/14/19 0647 08/18/19 0620  AST 25 56* 43* 21  ALT 45* 69* 74* 45*  ALKPHOS 183* 164* 149* 113  BILITOT 0.2* 0.3 0.4 0.4  PROT 6.9 6.6 7.0 7.0  ALBUMIN 2.1* 2.0* 2.0* 2.2*   No results for input(s): LIPASE, AMYLASE in the last 168 hours. No results for input(s): AMMONIA in the last 168 hours. CBC: Recent Labs  Lab 08/12/19 1317 08/13/19 0535 08/14/19 0647 08/18/19 0620  WBC 9.4 7.7 7.0 5.0  NEUTROABS  --   --   --  2.9  HGB  9.5* 9.1* 9.3* 9.2*  HCT 29.3* 29.5* 30.2* 28.6*  MCV 84.9 87.3 89.1 85.1  PLT 617* 621* 647* 443*   Cardiac Enzymes:   Recent Labs  Lab 08/17/19 0500  CKTOTAL 18*   BNP (last 3 results) No results for input(s): BNP in the last 8760 hours.  ProBNP (last 3 results) No results for input(s): PROBNP in the last 8760 hours.  CBG: No results for input(s): GLUCAP in the last 168 hours.  Recent Results (from the past 240 hour(s))  Body fluid culture     Status: None   Collection Time: 08/09/19  4:54 PM   Specimen: Body Fluid  Result Value Ref Range Status   Specimen Description FLUID PLEURAL RIGHT  Final   Special Requests NONE  Final   Gram Stain   Final    ABUNDANT WBC PRESENT, PREDOMINANTLY PMN MODERATE GRAM POSITIVE COCCI Performed at [facility], [address], [place], [place] [address]    Culture FEW METHICILLIN RESISTANT STAPHYLOCOCCUS AUREUS  Final   Report Status 08/12/2019 FINAL  Final   Organism ID, Bacteria METHICILLIN RESISTANT STAPHYLOCOCCUS AUREUS  Final      Susceptibility   Methicillin resistant staphylococcus aureus - MIC*    CIPROFLOXACIN >=8 RESISTANT Resistant     ERYTHROMYCIN >=8 RESISTANT Resistant     GENTAMICIN <=0.5 SENSITIVE Sensitive     OXACILLIN >=4 RESISTANT Resistant     TETRACYCLINE <=1 SENSITIVE Sensitive     VANCOMYCIN 1 SENSITIVE Sensitive     TRIMETH/SULFA <=10 SENSITIVE Sensitive     CLINDAMYCIN <=0.25 SENSITIVE Sensitive     RIFAMPIN <=0.5 SENSITIVE Sensitive     Inducible Clindamycin NEGATIVE Sensitive     * FEW METHICILLIN RESISTANT STAPHYLOCOCCUS AUREUS  Aerobic/Anaerobic Culture (surgical/deep wound)     Status: None   Collection Time: 08/11/19 10:35 AM   Specimen: Abscess  Result Value Ref Range Status   Specimen Description ABSCESS RIGHT  Final   Special Requests Normal  Final   Gram Stain   Final    ABUNDANT WBC PRESENT, PREDOMINANTLY PMN ABUNDANT GRAM POSITIVE COCCI    Culture   Final  ABUNDANT METHICILLIN  RESISTANT STAPHYLOCOCCUS AUREUS NO ANAEROBES ISOLATED Performed at [facility], [place] [address] [place], [place] [address]    Report Status 08/16/2019 FINAL  Final   Organism ID, Bacteria METHICILLIN RESISTANT STAPHYLOCOCCUS AUREUS  Final      Susceptibility   Methicillin resistant staphylococcus aureus - MIC*    CIPROFLOXACIN >=8 RESISTANT Resistant     ERYTHROMYCIN >=8 RESISTANT Resistant     GENTAMICIN <=0.5 SENSITIVE Sensitive     OXACILLIN >=4 RESISTANT Resistant     TETRACYCLINE <=1 SENSITIVE Sensitive     VANCOMYCIN 1 SENSITIVE Sensitive     TRIMETH/SULFA <=10 SENSITIVE Sensitive     CLINDAMYCIN <=0.25 SENSITIVE Sensitive     RIFAMPIN <=0.5 SENSITIVE Sensitive     Inducible Clindamycin NEGATIVE Sensitive     * ABUNDANT METHICILLIN RESISTANT STAPHYLOCOCCUS AUREUS  Aerobic/Anaerobic Culture (surgical/deep wound)     Status: None   Collection Time: 08/11/19 10:38 AM   Specimen: Abscess  Result Value Ref Range Status   Specimen Description ABSCESS RIGHT GLUTEAL  Final   Special Requests Normal  Final   Gram Stain   Final    FEW WBC PRESENT, PREDOMINANTLY PMN NO ORGANISMS SEEN    Culture   Final    FEW METHICILLIN RESISTANT STAPHYLOCOCCUS AUREUS NO ANAEROBES ISOLATED Performed at [facility], [address] [place], [place] [address]    Report Status 08/16/2019 FINAL  Final   Organism ID, Bacteria METHICILLIN RESISTANT STAPHYLOCOCCUS AUREUS  Final      Susceptibility   Methicillin resistant staphylococcus aureus - MIC*    CIPROFLOXACIN >=8 RESISTANT Resistant     ERYTHROMYCIN >=8 RESISTANT Resistant     GENTAMICIN <=0.5 SENSITIVE Sensitive     OXACILLIN >=4 RESISTANT Resistant     TETRACYCLINE <=1 SENSITIVE Sensitive     VANCOMYCIN 1 SENSITIVE Sensitive     TRIMETH/SULFA <=10 SENSITIVE Sensitive     CLINDAMYCIN <=0.25 SENSITIVE Sensitive     RIFAMPIN <=0.5 SENSITIVE Sensitive     Inducible Clindamycin NEGATIVE Sensitive     * FEW METHICILLIN RESISTANT  STAPHYLOCOCCUS AUREUS  Culture, Urine     Status: None   Collection Time: 08/17/19  1:27 PM   Specimen: Urine, Clean Catch  Result Value Ref Range Status   Specimen Description URINE, CLEAN CATCH  Final   Special Requests NONE  Final   Culture   Final    NO GROWTH Performed at [facility], [place] [address], [place], [place] [address]    Report Status 08/18/2019 FINAL  Final     Studies: No results found.  Scheduled Meds: . Chlorhexidine Gluconate Cloth  6 each Topical Daily  . docusate sodium  100 mg Oral BID  . DULoxetine  30 mg Oral Daily  . enoxaparin (LOVENOX) injection  40 mg Subcutaneous QHS  . feeding supplement (ENSURE ENLIVE)  237 mL Oral TID BM  . gabapentin  300 mg Oral TID  . lidocaine  1 patch Transdermal Q24H  . polyethylene glycol  17 g Oral Daily  . QUEtiapine  150 mg Oral QHS  . sodium chloride flush  10-40 mL Intracatheter Q12H  . traZODone  150 mg Oral QHS    Continuous Infusions: . sodium chloride 10 mL/hr at 08/17/19 1900  . vancomycin 1,000 mg (08/18/19 1205)     Time spent: 22mns I have personally reviewed and interpreted on  08/18/2019 daily labs, tele strips, imagings as discussed above under date review session and assessment  and plans.  I reviewed all nursing notes, pharmacy notes, consultant notes,  vitals, pertinent old records  I have discussed plan of care as described above with RN , patient on 08/18/2019   Florencia Reasons MD, PhD, FACP  [facility]  Available via Epic secure chat 7am-7pm for nonurgent issues Please page for urgent issues, pager number available through [email or web address] .   08/18/2019, 1:13 PM  LOS: 15 days

## 2019-08-18 NOTE — Progress Notes (Signed)
Pharmacy Antibiotic Note  Leslie Molina is a 34 y.o. female admitted on 08/03/2019 with MRSA bacteremia/abscess w/ osteo of saacrum and ischium. Patient continuing on vancomycin for disseminated MRSA infection.   Vancomycin levels collected today showed a peak of 34 and trough of 11 which indicates a therapeutic AUC of 548. Levels drawn appropriately, evening dose given slightly late yesterday evening. Although this AUC is borderline, given extent of infection will continue for now and watch renal function closely which has been stable ~0.5 mg/dl.   Plan: Continue vancomycin 1000mg  IV q8h  Repeat vancomycin levels next week or with changes to kidney function  Height: 5\' 1"  (154.9 cm) Weight: 141 lb 15.6 oz (64.4 kg) IBW/kg (Calculated) : 47.8  Temp (24hrs), Avg:99.6 F (37.6 C), Min:98 F (36.7 C), Max:101 F (38.3 C)  Recent Labs  Lab 08/12/19 1317 08/12/19 1317 08/13/19 0535 08/13/19 0535 08/14/19 0647 08/14/19 0647 08/15/19 0519 08/16/19 0807 08/16/19 1109 08/16/19 1504 08/17/19 0500 08/18/19 0620 08/18/19 1130  WBC 9.4  --  7.7  --  7.0  --   --   --   --   --   --  5.0  --   CREATININE 0.46   < > 0.45   < > 0.50   < > 0.67 0.57 0.52  --  0.55 0.54  --   VANCOTROUGH  --   --   --   --   --   --   --   --  14*  --   --   --  11*  VANCOPEAK  --   --   --   --   --   --   --   --   --  34  --  34  --    < > = values in this interval not displayed.    Estimated Creatinine Clearance: 85.1 mL/min (by C-G formula based on SCr of 0.54 mg/dL).    No Known Allergies  Antimicrobials this admission: 3/3 metronidazole x 1 3/3 vancomycin>>3/11 3/3 cefepime>>3/5 3/4 clindamycin >>3/5 3/11 Ceftaroline>>3/15 3/4 daptomycin 500mg  x 1; resume 3/11 >> 3/15  3/15 vancomycin >>  Microbiology results: 3/4 repeats: staph aureus 3/3BCx:GPC clusters 4/4 bottles (BCID = MRSA) 3/3 iliopsoas abscess:  MRSA  3/6 blood x2- neg 3/10 pleural fluid- staph aureus 3/12 wound culture -  staph aureus   Thank you for allowing pharmacy to be a part of this patient's care.  Arrie Senate, PharmD, BCPS Clinical Pharmacist [phone] Please check AMION for all [place] numbers 08/18/2019

## 2019-08-18 NOTE — Progress Notes (Signed)
34 y.o, female inpatient. History of IV drug use presented to this facility with lower back pain and fever found to have a right psoas drain on 3.12.12. Per Epic output has been:  10 ml, 35 ml, not documented, 35 ml  Pertinent Imaging 3.17.21 - MRI reads Extensive complex fluid collections again seen within the right iliopsoas musculature (series 3, images 18-21; series 6, images 1-6). Fluid collection extends distally along the course of the right iliopsoas myotendinous junction and closely approximates thetendon insertion at the lesser trochanter (series 6, images 21-22). Exact measurements of the collection are difficult to obtain given the complex multiloculated configuration. Approximate length of the collection is 23 cm. Overall volume of the fluid appears to of slightly decreased from prior. Partially visualized drainage Catheter with posterior approach.   Pertinent IR History 3.12.21 - right psoas drain placement  Pertinent Allergies NKDA  All labs and medications are within acceptable parameters.  Patient last temperature is 99.7.  Recommend team continue with flushing TID, output recording q shift and dressing changes as needed. Would consider additional imaging when output is less than 10 ml for 24 hours not including flush material.

## 2019-08-19 LAB — BASIC METABOLIC PANEL
Anion gap: 10 (ref 5–15)
BUN: 8 mg/dL (ref 6–20)
CO2: 27 mmol/L (ref 22–32)
Calcium: 8.7 mg/dL — ABNORMAL LOW (ref 8.9–10.3)
Chloride: 98 mmol/L (ref 98–111)
Creatinine, Ser: 0.62 mg/dL (ref 0.44–1.00)
GFR calc Af Amer: >60 mL/min (ref >60)
GFR calc non Af Amer: >60 mL/min (ref >60)
Glucose, Bld: 135 mg/dL — ABNORMAL HIGH (ref 70–99)
Potassium: 3.5 mmol/L (ref 3.5–5.1)
Sodium: 135 mmol/L (ref 135–145)

## 2019-08-19 MED ORDER — POTASSIUM CHLORIDE CRYS ER 20 MEQ PO TBCR
40.0000 meq | EXTENDED_RELEASE_TABLET | Freq: Once | ORAL | Status: AC
  Administered 2019-08-19: 40 meq via ORAL
  Filled 2019-08-19: qty 2

## 2019-08-19 NOTE — Progress Notes (Signed)
PROGRESS NOTE  Leslie Molina CXK:[id number] DOB: [birth year] DOA: 08/03/2019 PCP: [facility]  Brief summary:  Leslie Molina is an 34 y.o. 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  The abscess extends to the posterior aspect of the proximal right thigh.  Patient is transferred to from [facility] for surgical/GYN input, his initially accepted to ICU due to concern of septic shock on March 4, she is stabilized transferred to hospitalist service on March 8.  Culture data: COVID 3/3 >negative  MRSA PCR 3/3 >Positive  Blood culture 3/3 >MRSA  Pelvic abcess culture 3/3 >Abundant Staph aures, full result pending.  Antibiotic regimen: Daptomycin 3/3 once Cefepime 3/3-3/4 Clindamycin 3/3 once Vancomycin 3/3>>  Procedures: MRI of the right hip on 08/02/2019: Extensive right iliopsoas abscess extending from at least the L3-4 level to the insertion of the iliopsoas tendon on the lesser trochanter. 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  Myositis of the right gluteus minimus, medius, and maximus and right piriformis muscles. 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  Abscess extends through the posterior aspect of the right SI joint into the gluteal musculature. Fluid along the fascial planes in the anterior aspect of the proximal right thigh likely representing infectious fasciitis.  MRI of the lumbar spine on 08/03/2019: Septic right sacroiliac joint with associated osteomyelitis of the right sacral ala.  Extensive right iliopsoas abscess.  Edema and enhancement of the right gluteal muscles and right ultifidus muscle at the L5-S1 level  consistent with myositis. No evidence of epidural extension of abscess at this time.  CT of the neck on 08/06/2019 showed multiple peripheral predominant nodules at the lung apices suspicious for septic emboli and a right empyema with inflammation and stranding on the right retroclavicular region and right lower neck consistent with soft tissue infection.  IR was consulted who performe ultrasound-guided thoracentesis on 08/09/2019, aspirated 14 mL of purulent material.  Culture grew MRSA.  CT of the abdomen and pelvis 08/10/2019 showed Continued significant enlargement of right ileo psoas muscle with multiple irregular and peripherally enhancing low densities throughout its course, most consistent with abscesses. Probable abscesses are also noted in the right iliacus muscle as well as in the right gluteus medius muscle.  IR was reconsulted to perform gluteal abscess and right iliopsoas abscess needle aspiration on 08/11/2019 see below for further details.      HPI/Recap of past 24 hours:  Reports slept well last night, good appetite C/o right hip pain and low back pain,   She would like to continue IV fentanyl as needed in addition to oral oxycodone as needed  Right sided psoas drain continue with purulent drainage  Assessment/Plan: Principal Problem:   MRSA bacteremia Active Problems:   Sepsis ([place])   Iliopsoas abscess on right (HCC)   Septic shock (HCC)   Pain of right clavicle   Right hand pain   Osteomyelitis of sacrum (HCC)   Pleural empyema (HCC)   Pneumonia   Empyema (HCC)  Sepsis secondary to MRSA bacteremia and iliopsoas/gluteal abscess/osteomyelitis of the sacrum/myositis Septic arthritis of the right SI joint : Orthopedic Dr. Judee Clara input appreciated, medical management for now  possible infection of the right dorsum hand and right sternoclavicular joint infection Right chest empyema, thoracic surgery was initially consulted, thoracic surgery recommended IR consult  due to lack of accessible fluid collection IR evaluated patient, s/p ultrasound guided thoracentesis with removal of 13 cc of purulent fluid on March 10, collection was too small the time no chest tube placement recommended. -S/p a right psoas drain on 3.12.21 by IR, Recommend team continue with flushing TID, output recording q shift and dressing changes as needed. Would consider additional imaging when output is less than 10 ml for 24 hours not including flush material.  -IV vancomycin ID and thoracic surgery following Orthopedic consulted by ID  Cloudy urine with burning sensation -  urine culture no growth -encourage oral intake  Peritonitis/IUD perforation: CT scan of the abdomen her abdominal exam is benign. Gynecology and general surgery was consulted and they have signed off. They recommended no surgical intervention at this time.  Bipolar disorder/anxiety/depression: Home medications Cymbalta, Seroquel trazodone were held.  Hypokalemia, continue replace , check mag .   Thrombocytosis Likely reactive, improving  Normocytic anemia, in the setting of acute illness Hemoglobin has been stable about 9 the last few days Monitor  Previously documented narcotic seeking behavior, please refer notes on March 16 by Dr. Aileen Fass Monitor  H/o IVDU -She reports interested in starting Suboxone when her pain is getting better in the next few days, currently she want to keep the current pain regimen -She is thinking about eventually going away from [place] to [place] to live with her dad.  Cigarette smoking  DVT Prophylaxis: Lovenox  Code Status: Full  Family Communication: Family (mother and daughter) at bedside with patient permission on March 17  Disposition Plan:    Patient came from:                   Transferred from [facility]                                                                                        Anticipated d/c place:  TBD  Barriers to d/c OR conditions  which need to be met to effect a safe d/c:  Not ready to discharge, need to finish IV antibiotic treatment in-house   Consultants:  ID  Orthopedic, signed off  IR  Critical care signed off  Wound care  General surgery signed off  Gyn signed off  Thoracic surgery, signed off  Procedures:  Ultrasound guided right-sided thoracentesis on March 10 by IR  a right psoas drain on 3.12.21  PICC line placement  Antibiotics:  IV vancomycin   Objective: BP (!) 87/60 (BP Location: Right Arm)   Pulse 91   Temp 100.1 F (37.8 C) (Oral)   Resp 18   Ht _0  (1.549 m)   Wt 64.4 kg   LMP  (LMP Unknown)   SpO2 98%   BMI 26.83 kg/m   Intake/Output Summary (Last 24 hours) at 08/19/2019 0755 Last data filed at 08/18/2019 2109 Gross per 24 hour  Intake 25 ml  Output 760 ml  Net -735 ml   Filed Weights   08/16/19 0656  08/17/19 0342 08/18/19 0416  Weight: 65.1 kg 64.1 kg 64.4 kg    Exam: Patient is examined daily including today on 08/19/2019, exams remain the same as of yesterday except that has changed    General:  NAD, picc line left arm  Cardiovascular: RRR  Respiratory: CTABL  Abdomen: Soft/ND/NT, positive BS  Musculoskeletal: No Edema, right psoas drain with purulent drainage  Neuro: alert, oriented   Skin: Psoriasis changes in lower extremity  Data Reviewed: Basic Metabolic Panel: Recent Labs  Lab 08/16/19 0807 08/16/19 1109 08/17/19 0500 08/18/19 0620 08/19/19 0500  NA 137 136 136 137 135  K 4.3 3.6 4.1 4.3 3.5  CL 97* 98 97* 98 98  CO2 _0 GLUCOSE 101* 144* 106* 106* 135*  BUN _1 CREATININE 0.57 0.52 0.55 0.54 0.62  CALCIUM 9.0 8.6* 8.7* 8.8* 8.7*  MG  --   --   --  2.0  --    Liver Function Tests: Recent Labs  Lab 08/12/19 1317 08/13/19 0535 08/14/19 0647 08/18/19 0620  AST 25 56* 43* 21  ALT 45* 69* 74* 45*  ALKPHOS 183* 164* 149* 113  BILITOT 0.2* 0.3 0.4 0.4  PROT 6.9 6.6 7.0 7.0  ALBUMIN 2.1* 2.0* 2.0*  2.2*   No results for input(s): LIPASE, AMYLASE in the last 168 hours. No results for input(s): AMMONIA in the last 168 hours. CBC: Recent Labs  Lab 08/12/19 1317 08/13/19 0535 08/14/19 0647 08/18/19 0620  WBC 9.4 7.7 7.0 5.0  NEUTROABS  --   --   --  2.9  HGB 9.5* 9.1* 9.3* 9.2*  HCT 29.3* 29.5* 30.2* 28.6*  MCV 84.9 87.3 89.1 85.1  PLT 617* 621* 647* 443*   Cardiac Enzymes:   Recent Labs  Lab 08/17/19 0500  CKTOTAL 18*   BNP (last 3 results) No results for input(s): BNP in the last 8760 hours.  ProBNP (last 3 results) No results for input(s): PROBNP in the last 8760 hours.  CBG: No results for input(s): GLUCAP in the last 168 hours.  Recent Results (from the past 240 hour(s))  Body fluid culture     Status: None   Collection Time: 08/09/19  4:54 PM   Specimen: Body Fluid  Result Value Ref Range Status   Specimen Description FLUID PLEURAL RIGHT  Final   Special Requests NONE  Final   Gram Stain   Final    ABUNDANT WBC PRESENT, PREDOMINANTLY PMN MODERATE GRAM POSITIVE COCCI Performed at [facility], [address], [place], [place] [address]    Culture FEW METHICILLIN RESISTANT STAPHYLOCOCCUS AUREUS  Final   Report Status 08/12/2019 FINAL  Final   Organism ID, Bacteria METHICILLIN RESISTANT STAPHYLOCOCCUS AUREUS  Final      Susceptibility   Methicillin resistant staphylococcus aureus - MIC*    CIPROFLOXACIN >=8 RESISTANT Resistant     ERYTHROMYCIN >=8 RESISTANT Resistant     GENTAMICIN <=0.5 SENSITIVE Sensitive     OXACILLIN >=4 RESISTANT Resistant     TETRACYCLINE <=1 SENSITIVE Sensitive     VANCOMYCIN 1 SENSITIVE Sensitive     TRIMETH/SULFA <=10 SENSITIVE Sensitive     CLINDAMYCIN <=0.25 SENSITIVE Sensitive     RIFAMPIN <=0.5 SENSITIVE Sensitive     Inducible Clindamycin NEGATIVE Sensitive     * FEW METHICILLIN RESISTANT STAPHYLOCOCCUS AUREUS  Aerobic/Anaerobic Culture (surgical/deep wound)     Status: None   Collection Time: 08/11/19 10:35 AM    Specimen: Abscess  Result Value Ref Range Status   Specimen Description ABSCESS RIGHT  Final   Special Requests Normal  Final   Gram Stain   Final    ABUNDANT WBC PRESENT, PREDOMINANTLY PMN ABUNDANT GRAM POSITIVE COCCI    Culture   Final    ABUNDANT METHICILLIN RESISTANT STAPHYLOCOCCUS AUREUS NO ANAEROBES ISOLATED Performed at [facility], [address] [place], [place] [address]    Report Status 08/16/2019 FINAL  Final   Organism ID, Bacteria METHICILLIN RESISTANT STAPHYLOCOCCUS AUREUS  Final      Susceptibility   Methicillin resistant staphylococcus aureus - MIC*    CIPROFLOXACIN >=8 RESISTANT Resistant     ERYTHROMYCIN >=8 RESISTANT Resistant     GENTAMICIN <=0.5 SENSITIVE Sensitive     OXACILLIN >=4 RESISTANT Resistant     TETRACYCLINE <=1 SENSITIVE Sensitive     VANCOMYCIN 1 SENSITIVE Sensitive     TRIMETH/SULFA <=10 SENSITIVE Sensitive     CLINDAMYCIN <=0.25 SENSITIVE Sensitive     RIFAMPIN <=0.5 SENSITIVE Sensitive     Inducible Clindamycin NEGATIVE Sensitive     * ABUNDANT METHICILLIN RESISTANT STAPHYLOCOCCUS AUREUS  Aerobic/Anaerobic Culture (surgical/deep wound)     Status: None   Collection Time: 08/11/19 10:38 AM   Specimen: Abscess  Result Value Ref Range Status   Specimen Description ABSCESS RIGHT GLUTEAL  Final   Special Requests Normal  Final   Gram Stain   Final    FEW WBC PRESENT, PREDOMINANTLY PMN NO ORGANISMS SEEN    Culture   Final    FEW METHICILLIN RESISTANT STAPHYLOCOCCUS AUREUS NO ANAEROBES ISOLATED Performed at [facility], [address], [place], [place] [address]    Report Status 08/16/2019 FINAL  Final   Organism ID, Bacteria METHICILLIN RESISTANT STAPHYLOCOCCUS AUREUS  Final      Susceptibility   Methicillin resistant staphylococcus aureus - MIC*    CIPROFLOXACIN >=8 RESISTANT Resistant     ERYTHROMYCIN >=8 RESISTANT Resistant     GENTAMICIN <=0.5 SENSITIVE Sensitive     OXACILLIN >=4 RESISTANT Resistant      TETRACYCLINE <=1 SENSITIVE Sensitive     VANCOMYCIN 1 SENSITIVE Sensitive     TRIMETH/SULFA <=10 SENSITIVE Sensitive     CLINDAMYCIN <=0.25 SENSITIVE Sensitive     RIFAMPIN <=0.5 SENSITIVE Sensitive     Inducible Clindamycin NEGATIVE Sensitive     * FEW METHICILLIN RESISTANT STAPHYLOCOCCUS AUREUS  Culture, Urine     Status: None   Collection Time: 08/17/19  1:27 PM   Specimen: Urine, Clean Catch  Result Value Ref Range Status   Specimen Description URINE, CLEAN CATCH  Final   Special Requests NONE  Final   Culture   Final    NO GROWTH Performed at [facility], [place] [address], [place], [place] [address]    Report Status 08/18/2019 FINAL  Final     Studies: No results found.  Scheduled Meds: . Chlorhexidine Gluconate Cloth  6 each Topical Daily  . docusate sodium  100 mg Oral BID  . DULoxetine  30 mg Oral Daily  . enoxaparin (LOVENOX) injection  40 mg Subcutaneous QHS  . feeding supplement (ENSURE ENLIVE)  237 mL Oral TID BM  . gabapentin  300 mg Oral TID  . lidocaine  1 patch Transdermal Q24H  . polyethylene glycol  17 g Oral Daily  . potassium chloride  40 mEq Oral Once  . QUEtiapine  150 mg Oral QHS  . sodium chloride flush  10-40 mL Intracatheter Q12H  .  traZODone  150 mg Oral QHS    Continuous Infusions: . sodium chloride 10 mL/hr at 08/19/19 0310  . vancomycin 1,000 mg (08/19/19 0451)     Time spent: 56mns I have personally reviewed and interpreted on  08/19/2019 daily labs, tele strips, imagings as discussed above under date review session and assessment and plans.  I reviewed all nursing notes, pharmacy notes, consultant notes,  vitals, pertinent old records  I have discussed plan of care as described above with RN , patient on 08/19/2019   FFlorencia ReasonsMD, PhD, FACP  [facility]  Available via Epic secure chat 7am-7pm for nonurgent issues Please page for urgent issues, pager number available through [email or web address] .   08/19/2019, 7:55 AM  LOS: 16  days

## 2019-08-20 LAB — BASIC METABOLIC PANEL
Anion gap: 10 (ref 5–15)
BUN: 9 mg/dL (ref 6–20)
CO2: 26 mmol/L (ref 22–32)
Calcium: 8.9 mg/dL (ref 8.9–10.3)
Chloride: 100 mmol/L (ref 98–111)
Creatinine, Ser: 0.66 mg/dL (ref 0.44–1.00)
GFR calc Af Amer: >60 mL/min (ref >60)
GFR calc non Af Amer: >60 mL/min (ref >60)
Glucose, Bld: 111 mg/dL — ABNORMAL HIGH (ref 70–99)
Potassium: 4 mmol/L (ref 3.5–5.1)
Sodium: 136 mmol/L (ref 135–145)

## 2019-08-20 LAB — MAGNESIUM: Magnesium: 2 mg/dL (ref 1.7–2.4)

## 2019-08-20 MED ORDER — FENTANYL CITRATE (PF) 100 MCG/2ML IJ SOLN
12.5000 ug | INTRAMUSCULAR | Status: DC | PRN
  Administered 2019-08-20 – 2019-09-01 (×53): 12.5 ug via INTRAVENOUS
  Filled 2019-08-20 (×53): qty 2

## 2019-08-20 MED ORDER — FENTANYL CITRATE (PF) 100 MCG/2ML IJ SOLN
12.5000 ug | Freq: Four times a day (QID) | INTRAMUSCULAR | Status: DC | PRN
  Administered 2019-08-20: 12.5 ug via INTRAVENOUS
  Filled 2019-08-20: qty 2

## 2019-08-20 NOTE — Progress Notes (Signed)
PROGRESS NOTE  Taeja Debellis JJH:[id number] DOB: 25-Feb-1986 DOA: 08/03/2019 PCP: [facility] [place]  Brief summary:  Lastacia Solum is an 34 y.o. 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  The abscess extends to the posterior aspect of the proximal right thigh.  Patient is transferred to from [facility] for surgical/GYN input, his initially accepted to ICU due to concern of septic shock on March 4, she is stabilized transferred to hospitalist service on March 8.  Culture data: COVID 3/3 >negative  MRSA PCR 3/3 >Positive  Blood culture 3/3 >MRSA  Pelvic abcess culture 3/3 >Abundant Staph aures, full result pending.  Antibiotic regimen: Daptomycin 3/3 once Cefepime 3/3-3/4 Clindamycin 3/3 once Vancomycin 3/3>>  Procedures: MRI of the right hip on 08/02/2019: Extensive right iliopsoas abscess extending from at least the L3-4 level to the insertion of the iliopsoas tendon on the lesser trochanter. 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  Myositis of the right gluteus minimus, medius, and maximus and right piriformis muscles. 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  Abscess extends through the posterior aspect of the right SI joint into the gluteal musculature. Fluid along the fascial planes in the anterior aspect of the proximal right thigh likely representing infectious fasciitis.  MRI of the lumbar spine on 08/03/2019: Septic right sacroiliac joint with associated osteomyelitis of the right sacral ala.  Extensive right iliopsoas abscess.  Edema and enhancement of the right gluteal muscles and right ultifidus muscle at the L5-S1 level  consistent with myositis. No evidence of epidural extension of abscess at this time.  CT of the neck on 08/06/2019 showed multiple peripheral predominant nodules at the lung apices suspicious for septic emboli and a right empyema with inflammation and stranding on the right retroclavicular region and right lower neck consistent with soft tissue infection.  IR was consulted who performe ultrasound-guided thoracentesis on 08/09/2019, aspirated 14 mL of purulent material.  Culture grew MRSA.  CT of the abdomen and pelvis 08/10/2019 showed Continued significant enlargement of right ileo psoas muscle with multiple irregular and peripherally enhancing low densities throughout its course, most consistent with abscesses. Probable abscesses are also noted in the right iliacus muscle as well as in the right gluteus medius muscle.  IR was reconsulted to perform gluteal abscess and right iliopsoas abscess needle aspiration on 08/11/2019 see below for further details.      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 Judee Clara input appreciated, medical management for now possible  infection of the right dorsum hand and right sternoclavicular joint infection Right chest empyema, thoracic surgery was initially consulted, thoracic surgery recommended IR consult due to lack  of accessible fluid collection IR evaluated patient, s/p ultrasound guided thoracentesis with removal of 13 cc of purulent fluid on March 10, collection was too small the time no chest tube placement recommended. -S/p a right psoas drain on 3.12.21 by IR, Recommend team continue with flushing TID, output recording q shift and dressing changes as needed. Would consider additional imaging when output is less than 10 ml for 24 hours not including flush material.  -IV vancomycin ID and thoracic surgery following Orthopedic consulted by ID  Cloudy urine with burning sensation -  urine culture no growth -encourage oral intake  Peritonitis/IUD perforation: CT scan of the abdomen her abdominal exam is benign. Gynecology and general surgery was consulted and they have signed off. They recommended no surgical intervention at this time.  Bipolar disorder/anxiety/depression: Home medications Cymbalta, Seroquel trazodone were held.  Hypokalemia, continue replace , check mag .   Thrombocytosis Likely reactive, improving  Normocytic anemia, in the setting of acute illness Hemoglobin has been stable about 9 the last few days Monitor  Previously documented narcotic seeking behavior, please refer notes on March 16 by Dr. Aileen Fass Monitor  H/o IVDU -She reports interested in starting Suboxone when her pain is getting better in the next few days, currently she want to keep the current pain regimen -She is thinking about eventually going away from [place] to [place] to live with her dad.  Cigarette smoking  DVT Prophylaxis: Lovenox  Code Status: Full  Family Communication: Family (mother and daughter) at bedside with patient permission on March 17  Disposition Plan:    Patient came from:                   Transferred from [facility]                                                                                        Anticipated d/c place:  TBD  Barriers to d/c OR conditions which need  to be met to effect a safe d/c:  Not ready to discharge, need to finish IV antibiotic treatment in-house  Telemetry has been stable, will DC telemetry Consultants:  ID  Orthopedic, signed off  IR  Critical care signed off  Wound care  General surgery signed off  Gyn signed off  Thoracic surgery, signed off  Procedures:  Ultrasound guided right-sided thoracentesis on March 10 by IR  a right psoas drain on 3.12.21  PICC line placement  Antibiotics:  IV vancomycin   Objective: BP 91/63 (BP Location: Right Arm)   Pulse 86   Temp 98.4 F (36.9 C) (Oral)   Resp 18   Ht 5' 1"  (1.549 m)   Wt 64.3 kg   LMP  (LMP Unknown)   SpO2 99%   BMI 26.77 kg/m   Intake/Output Summary (Last 24 hours) at 08/20/2019 0731 Last data filed at 08/19/2019 2259 Gross per 24 hour  Intake 380 ml  Output 2700 ml  Net -2320 ml   [employer]  08/17/19 0342 08/18/19 0416 08/20/19 0605  Weight: 64.1 kg 64.4 kg 64.3 kg    Exam: Patient is examined daily including today on 08/20/2019, exams remain the same as of yesterday except that has changed    General:  NAD, picc line left arm  Cardiovascular: RRR  Respiratory: CTABL  Abdomen: Soft/ND/NT, positive BS  Musculoskeletal: No Edema, right psoas drain with purulent drainage  Neuro: alert, oriented   Skin: Psoriasis changes in lower extremity  Data Reviewed: Basic Metabolic Panel: Recent Labs  Lab 08/16/19 1109 08/17/19 0500 08/18/19 0620 08/19/19 0500 08/20/19 0500  NA 136 136 137 135 136  K 3.6 4.1 4.3 3.5 4.0  CL 98 97* 98 98 100  CO2 27 29 30 27 26   GLUCOSE 144* 106* 106* 135* 111*  BUN 8 7 8 8 9   CREATININE 0.52 0.55 0.54 0.62 0.66  CALCIUM 8.6* 8.7* 8.8* 8.7* 8.9  MG  --   --  2.0  --  2.0   Liver Function Tests: Recent Labs  Lab 08/14/19 0647 08/18/19 0620  AST 43* 21  ALT 74* 45*  ALKPHOS 149* 113  BILITOT 0.4 0.4  PROT 7.0 7.0  ALBUMIN 2.0* 2.2*   No results for input(s): LIPASE, AMYLASE in  the last 168 hours. No results for input(s): AMMONIA in the last 168 hours. CBC: Recent Labs  Lab 08/14/19 0647 08/18/19 0620  WBC 7.0 5.0  NEUTROABS  --  2.9  HGB 9.3* 9.2*  HCT 30.2* 28.6*  MCV 89.1 85.1  PLT 647* 443*   Cardiac Enzymes:   Recent Labs  Lab 08/17/19 0500  CKTOTAL 18*   BNP (last 3 results) No results for input(s): BNP in the last 8760 hours.  ProBNP (last 3 results) No results for input(s): PROBNP in the last 8760 hours.  CBG: No results for input(s): GLUCAP in the last 168 hours.  Recent Results (from the past 240 hour(s))  Aerobic/Anaerobic Culture (surgical/deep wound)     Status: None   Collection Time: 08/11/19 10:35 AM   Specimen: Abscess  Result Value Ref Range Status   Specimen Description ABSCESS RIGHT  Final   Special Requests Normal  Final   Gram Stain   Final    ABUNDANT WBC PRESENT, PREDOMINANTLY PMN ABUNDANT GRAM POSITIVE COCCI    Culture   Final    ABUNDANT METHICILLIN RESISTANT STAPHYLOCOCCUS AUREUS NO ANAEROBES ISOLATED Performed at [facility], [address] [place], [place] [address]    Report Status 08/16/2019 FINAL  Final   Organism ID, Bacteria METHICILLIN RESISTANT STAPHYLOCOCCUS AUREUS  Final      Susceptibility   Methicillin resistant staphylococcus aureus - MIC*    CIPROFLOXACIN >=8 RESISTANT Resistant     ERYTHROMYCIN >=8 RESISTANT Resistant     GENTAMICIN <=0.5 SENSITIVE Sensitive     OXACILLIN >=4 RESISTANT Resistant     TETRACYCLINE <=1 SENSITIVE Sensitive     VANCOMYCIN 1 SENSITIVE Sensitive     TRIMETH/SULFA <=10 SENSITIVE Sensitive     CLINDAMYCIN <=0.25 SENSITIVE Sensitive     RIFAMPIN <=0.5 SENSITIVE Sensitive     Inducible Clindamycin NEGATIVE Sensitive     * ABUNDANT METHICILLIN RESISTANT STAPHYLOCOCCUS AUREUS  Aerobic/Anaerobic Culture (surgical/deep wound)     Status: None   Collection Time: 08/11/19 10:38 AM   Specimen: Abscess  Result Value Ref Range Status   Specimen Description  ABSCESS RIGHT GLUTEAL  Final   Special Requests Normal  Final   Gram Stain   Final  FEW WBC PRESENT, PREDOMINANTLY PMN NO ORGANISMS SEEN    Culture   Final    FEW METHICILLIN RESISTANT STAPHYLOCOCCUS AUREUS NO ANAEROBES ISOLATED Performed at [facility], [place] [address], [place], [place] [address]    Report Status 08/16/2019 FINAL  Final   Organism ID, Bacteria METHICILLIN RESISTANT STAPHYLOCOCCUS AUREUS  Final      Susceptibility   Methicillin resistant staphylococcus aureus - MIC*    CIPROFLOXACIN >=8 RESISTANT Resistant     ERYTHROMYCIN >=8 RESISTANT Resistant     GENTAMICIN <=0.5 SENSITIVE Sensitive     OXACILLIN >=4 RESISTANT Resistant     TETRACYCLINE <=1 SENSITIVE Sensitive     VANCOMYCIN 1 SENSITIVE Sensitive     TRIMETH/SULFA <=10 SENSITIVE Sensitive     CLINDAMYCIN <=0.25 SENSITIVE Sensitive     RIFAMPIN <=0.5 SENSITIVE Sensitive     Inducible Clindamycin NEGATIVE Sensitive     * FEW METHICILLIN RESISTANT STAPHYLOCOCCUS AUREUS  Culture, Urine     Status: None   Collection Time: 08/17/19  1:27 PM   Specimen: Urine, Clean Catch  Result Value Ref Range Status   Specimen Description URINE, CLEAN CATCH  Final   Special Requests NONE  Final   Culture   Final    NO GROWTH Performed at [facility], [place] [address] [place], [place] [address]    Report Status 08/18/2019 FINAL  Final     Studies: No results found.  Scheduled Meds: . Chlorhexidine Gluconate Cloth  6 each Topical Daily  . docusate sodium  100 mg Oral BID  . DULoxetine  30 mg Oral Daily  . enoxaparin (LOVENOX) injection  40 mg Subcutaneous QHS  . feeding supplement (ENSURE ENLIVE)  237 mL Oral TID BM  . gabapentin  300 mg Oral TID  . polyethylene glycol  17 g Oral Daily  . QUEtiapine  150 mg Oral QHS  . sodium chloride flush  10-40 mL Intracatheter Q12H  . traZODone  150 mg Oral QHS    Continuous Infusions: . sodium chloride 10 mL/hr at 08/19/19 0310  . vancomycin 1,000 mg (08/20/19  0342)     Time spent: 41mns I have personally reviewed and interpreted on  08/20/2019 daily labs, tele strips, imagings as discussed above under date review session and assessment and plans.  I reviewed all nursing notes, pharmacy notes, consultant notes,  vitals, pertinent old records  I have discussed plan of care as described above with RN , patient on 08/20/2019   FFlorencia ReasonsMD, PhD, FACP  [facility]  Available via Epic secure chat 7am-7pm for nonurgent issues Please page for urgent issues, pager number available through [email or web address] .   08/20/2019, 7:31 AM  LOS: 17 days

## 2019-08-21 DIAGNOSIS — L0291 Cutaneous abscess, unspecified: Secondary | ICD-10-CM

## 2019-08-21 LAB — CBC WITH DIFFERENTIAL/PLATELET
Abs Immature Granulocytes: 0.04 10*3/uL (ref 0.00–0.07)
Basophils Absolute: 0 10*3/uL (ref 0.0–0.1)
Basophils Relative: 1 %
Eosinophils Absolute: 0.2 10*3/uL (ref 0.0–0.5)
Eosinophils Relative: 3 %
HCT: 30.2 % — ABNORMAL LOW (ref 36.0–46.0)
Hemoglobin: 9.5 g/dL — ABNORMAL LOW (ref 12.0–15.0)
Immature Granulocytes: 1 %
Lymphocytes Relative: 22 %
Lymphs Abs: 1.3 10*3/uL (ref 0.7–4.0)
MCH: 27.1 pg (ref 26.0–34.0)
MCHC: 31.5 g/dL (ref 30.0–36.0)
MCV: 86 fL (ref 80.0–100.0)
Monocytes Absolute: 0.5 10*3/uL (ref 0.1–1.0)
Monocytes Relative: 8 %
Neutro Abs: 4.1 10*3/uL (ref 1.7–7.7)
Neutrophils Relative %: 65 %
Platelets: 386 10*3/uL (ref 150–400)
RBC: 3.51 MIL/uL — ABNORMAL LOW (ref 3.87–5.11)
RDW: 13.4 % (ref 11.5–15.5)
WBC: 6.1 10*3/uL (ref 4.0–10.5)
nRBC: 0 % (ref 0.0–0.2)

## 2019-08-21 LAB — BASIC METABOLIC PANEL
Anion gap: 11 (ref 5–15)
BUN: 10 mg/dL (ref 6–20)
CO2: 27 mmol/L (ref 22–32)
Calcium: 8.9 mg/dL (ref 8.9–10.3)
Chloride: 100 mmol/L (ref 98–111)
Creatinine, Ser: 0.84 mg/dL (ref 0.44–1.00)
GFR calc Af Amer: >60 mL/min (ref >60)
GFR calc non Af Amer: >60 mL/min (ref >60)
Glucose, Bld: 115 mg/dL — ABNORMAL HIGH (ref 70–99)
Potassium: 3.9 mmol/L (ref 3.5–5.1)
Sodium: 138 mmol/L (ref 135–145)

## 2019-08-21 MED ORDER — NICOTINE 14 MG/24HR TD PT24
14.0000 mg | MEDICATED_PATCH | Freq: Every day | TRANSDERMAL | Status: DC
  Administered 2019-08-21: 14 mg via TRANSDERMAL
  Filled 2019-08-21 (×10): qty 1

## 2019-08-21 NOTE — Progress Notes (Signed)
Referring Physician(s): Dr Dillard Cannon  Supervising Physician: Aletta Edouard  Patient Status:  [facility] - In-pt  Chief Complaint:  Right psoas abscess drain placed 08/12/19  Subjective:  MRSA bacteremia Rt psoas abscess- drain placed in IR 08/12/19 OP slowing Consider re CT at this point  5 cc yesterday and today so far Minimal in JP  Pain is mostly concentrated at Rt back and hip at this point Can roll in bed easier Denies N/V Eating reg diet  ID recommending 6 weeks IV antibx and then po for weeks after     Allergies: Patient has no known allergies.  Medications: Prior to Admission medications   Medication Sig Start Date End Date Taking? Authorizing Provider  acetaminophen (TYLENOL) 325 MG tablet Take 2 tablets (650 mg total) by mouth every 6 (six) hours as needed for fever. 08/03/19  Yes Nolberto Hanlon, MD  ibuprofen (ADVIL) 200 MG tablet Take 200 mg by mouth every 6 (six) hours as needed for moderate pain.   Yes [provider]  ceFEPIme 2 g in sodium chloride 0.9 % 100 mL Inject 2 g into the vein every 8 (eight) hours. Patient not taking: Reported on 08/03/2019 08/03/19   Nolberto Hanlon, MD  Chlorhexidine Gluconate Cloth 2 % PADS Apply 6 each topically daily. Patient not taking: Reported on 08/03/2019 08/03/19   Nolberto Hanlon, MD  clindamycin (CLEOCIN) 900 MG/50ML IVPB Inject 50 mLs (900 mg total) into the vein every 8 (eight) hours. Patient not taking: Reported on 08/03/2019 08/03/19   Nolberto Hanlon, MD  Dextrose-Sodium Chloride (DEXTROSE 5 % AND 0.45% NACL) infusion Inject 150 mL/hr into the vein continuous. Patient not taking: Reported on 08/03/2019 08/03/19   Nolberto Hanlon, MD  enoxaparin (LOVENOX) 40 MG/0.4ML injection Inject 0.4 mLs (40 mg total) into the skin daily. Patient not taking: Reported on 08/03/2019 08/03/19   Nolberto Hanlon, MD  norepinephrine (LEVOPHED) 4-5 MG/250ML-% SOLN Inject 0-40 mcg/min into the vein continuous. Patient not taking: Reported on 08/03/2019 08/03/19    Nolberto Hanlon, MD  oxyCODONE (OXY IR/ROXICODONE) 5 MG immediate release tablet Take 1 tablet (5 mg total) by mouth every 6 (six) hours as needed for breakthrough pain. Patient not taking: Reported on 08/03/2019 08/03/19   Nolberto Hanlon, MD  potassium chloride 10 MEQ/100ML Inject 100 mLs (10 mEq total) into the vein every 1 hour x 6 doses. Patient not taking: Reported on 08/03/2019 08/03/19   Nolberto Hanlon, MD  vancomycin (VANCOREADY) 750 MG/150ML SOLN Inject 150 mLs (750 mg total) into the vein every 8 (eight) hours. Patient not taking: Reported on 08/03/2019 08/03/19   Nolberto Hanlon, MD     Vital Signs: BP 90/63 (BP Location: Right Arm)   Pulse 88   Temp 99.1 F (37.3 C) (Oral)   Resp 14   Ht 5\' 1"  (1.549 m)   Wt 143 lb 8.3 oz (65.1 kg)   LMP  (LMP Unknown)   SpO2 97%   BMI 27.12 kg/m   Physical Exam Vitals reviewed.  Skin:    General: Skin is warm and dry.     Comments: Site of Rt psoas abscess is clean and dry' NT no bleeding Flushes easily OP minimal now for few days milky yellow color  Neurological:     Mental Status: She is alert.     Imaging: No results found.  Labs:  CBC: Recent Labs    08/13/19 0535 08/14/19 0647 08/18/19 0620 08/21/19 0458  WBC 7.7 7.0 5.0 6.1  HGB 9.1* 9.3*  9.2* 9.5*  HCT 29.5* 30.2* 28.6* 30.2*  PLT 621* 647* 443* 386    COAGS: Recent Labs    08/03/19 0250 08/11/19 0427  INR 1.3* 1.3*    BMP: Recent Labs    08/18/19 0620 08/19/19 0500 08/20/19 0500 08/21/19 0458  NA 137 135 136 138  K 4.3 3.5 4.0 3.9  CL 98 98 100 100  CO2 30 27 26 27   GLUCOSE 106* 135* 111* 115*  BUN 8 8 9 10   CALCIUM 8.8* 8.7* 8.9 8.9  CREATININE 0.54 0.62 0.66 0.84  GFRNONAA >60 >60 >60 >60  GFRAA >60 >60 >60 >60    LIVER FUNCTION TESTS: Recent Labs    08/12/19 1317 08/13/19 0535 08/14/19 0647 08/18/19 0620  BILITOT 0.2* 0.3 0.4 0.4  AST 25 56* 43* 21  ALT 45* 69* 74* 45*  ALKPHOS 183* 164* 149* 113  PROT 6.9 6.6 7.0 7.0  ALBUMIN 2.1* 2.0*  2.0* 2.2*    Assessment and Plan:  OP minimal for few days Milky yellow color Flushes easily Consider re CT soon IR will follow---- will need OP follow up with IR- We will coordinate upon DC  Electronically Signed: Lavonia Drafts, PA-C 08/21/2019, 1:20 PM   I spent a total of 15 Minutes at the the patient's bedside AND on the patient's hospital floor or unit, greater than 50% of which was counseling/coordinating care for Rt psoas abscess

## 2019-08-21 NOTE — Progress Notes (Signed)
Leslie Molina for Infectious Disease  Date of Admission:  08/03/2019      Total days of antibiotics 19  Day 19 Vancomycin            ASSESSMENT: Leslie Molina is a 34 y.o. female with disseminated MRSA infection including R dorsal hand, Right thigh/hip/sacrum with associated septic arthritis/osteomyelitis, R empyema (s/p thoracentesis). She is on day 19 of treatment with Vancomycin. Afebrile and cleared her blood cultures. 50 - 60 mL drainage from R psoas drain daily x 48h. Would anticipate 6 weeks minimum IV therapy and likely PO thereafter given the burden of infection. She has ongoing pain and drainage to the right hip s/p drain place - anticipate repeat imaging soon to determine further interventions. Orthopedics recommended medical management and perc drain for now with recs to call back should she fail therapy.  Check CRP and ESR for therapeutic monitoring.  Would recommend consideration for physical therapy also if she is agreeable - mentioned she has only been walking from bed to chair.   Sed Rate (mm/hr)  Date Value  08/05/2019 84 (H)  08/02/2019 101 (H)   CRP (mg/dL)  Date Value  08/05/2019 25.6 (H)  08/02/2019 39.0 (H)   Creatinine stable on current dose of Vancomycin. She is tolerating well without any side effects.   HIV, Hep B and Hep C all non-reactive this admission.   She is asking for recommendations for topical agent for psoriasis if we can provide one - defer to primary team.     PLAN: 1. Continue Vancomycin  2. Drain management and timing for repeat imaging per IR team 3. CRP / ESR in AM  4. Creatinine monitoring 5. Consideration for PT? 6. She would like topical for psoriasis if possible    Principal Problem:   MRSA bacteremia Active Problems:   Sepsis ([place])   Iliopsoas abscess on right ([place])   Septic shock ([place])   Pain of right clavicle   Right hand pain   Osteomyelitis of sacrum ([place])   Pleural empyema ([facility])   Pneumonia  Empyema ([place])   . Chlorhexidine Gluconate Cloth  6 each Topical Daily  . docusate sodium  100 mg Oral BID  . DULoxetine  30 mg Oral Daily  . enoxaparin (LOVENOX) injection  40 mg Subcutaneous QHS  . feeding supplement (ENSURE ENLIVE)  237 mL Oral TID BM  . gabapentin  300 mg Oral TID  . polyethylene glycol  17 g Oral Daily  . QUEtiapine  150 mg Oral QHS  . sodium chloride flush  10-40 mL Intracatheter Q12H  . traZODone  150 mg Oral QHS    SUBJECTIVE: Still with Right hip pain. Right hand has completely resolved and right shoulder/clavicle is much better to where she has full use of the arm without restriction.   Psoas drain is still in place and MRI reveals extensive osteomyelitis of the R hip.   Review of Systems: Review of Systems  Constitutional: Negative for chills and fever.  Respiratory: Negative for cough and shortness of breath.   Cardiovascular: Negative for chest pain.  Musculoskeletal: Positive for joint pain (R hip).    No Known Allergies  OBJECTIVE: Vitals:   08/20/19 0605 08/20/19 1552 08/20/19 2112 08/21/19 0616  BP: 91/63 (!) 92/58 93/61 90/63   Pulse: 86 (!) 101 88   Resp: 18 19 19 14   Temp: 98.4 F (36.9 C) 98.7 F (37.1 C) 98 F (36.7 C) 99.1 F (37.3 C)  TempSrc: Oral Oral Oral Oral  SpO2: 99%  100% 97%  Weight: 64.3 kg   65.1 kg  Height:       Body mass index is 27.12 kg/m.  Physical Exam Cardiovascular:     Rate and Rhythm: Normal rate and regular rhythm.     Heart sounds: No murmur.  Pulmonary:     Effort: Pulmonary effort is normal.     Breath sounds: Normal breath sounds.  Abdominal:     General: Abdomen is flat. Bowel sounds are normal. There is no distension.  Musculoskeletal:     Comments: R hand normal appearing with normal ROM.  R clavicle without any residual swelling/erythema R hip dressed with drain in place - yellow purulent drainage in bulb suction.   Skin:    General: Skin is warm and dry.  Neurological:     Mental  Status: She is oriented to person, place, and time.  Psychiatric:        Mood and Affect: Mood normal.   PIC line in LUA unremarkable.   Lab Results Lab Results  Component Value Date   WBC 6.1 08/21/2019   HGB 9.5 (L) 08/21/2019   HCT 30.2 (L) 08/21/2019   MCV 86.0 08/21/2019   PLT 386 08/21/2019    Lab Results  Component Value Date   CREATININE 0.84 08/21/2019   BUN 10 08/21/2019   NA 138 08/21/2019   K 3.9 08/21/2019   CL 100 08/21/2019   CO2 27 08/21/2019    Lab Results  Component Value Date   ALT 45 (H) 08/18/2019   AST 21 08/18/2019   ALKPHOS 113 08/18/2019   BILITOT 0.4 08/18/2019     Microbiology: Recent Results (from the past 240 hour(s))  Culture, Urine     Status: None   Collection Time: 08/17/19  1:27 PM   Specimen: Urine, Clean Catch  Result Value Ref Range Status   Specimen Description URINE, CLEAN CATCH  Final   Special Requests NONE  Final   Culture   Final    NO GROWTH Performed at [facility], [address], [place], [place] [address]    Report Status 08/18/2019 FINAL  Final     Janene Madeira, MSN, NP-C [facility] [email or web address] Pager: [phone] Office: [phone] [place]: [phone]

## 2019-08-21 NOTE — Progress Notes (Addendum)
PROGRESS NOTE    Leslie Molina  [id number]  DOB: [birth year]  PCP: [facility] [place] 34 y.o.femalepast medical history of bipolar disorder anxiety and IV drug abuse presents to the emergency room twice within a week with lower back pain she is found to have a temperature of 102 and right extremity pain an MRI of the right hip showed extensive right iliopsoas abscess with extensive myositis and osteomyelitis of the right sacrum and iliac bone. The abscess extends to the posterior aspect of the proximal right thigh.  Patient initially accepted to ICU on 3/4 from [facility] and concern for septic shock and for surgical/GYN input.  During the ICU course her blood cultures were positive for MRSA, pelvic abscess culture also showed abundant staph aureus on 3/3.  She received 1 dose of daptomycin/clindamycin/cefepime on 3/3, subsequently managed with IV vancomycin only from 3/3 to now. CT of the neck on 3/7/2021showed multiple peripheral predominant nodules at the lung apices suspicious for septic emboli and a right empyema with inflammation and stranding on the right retroclavicular region and right lower neck consistent with soft tissue infection. She was stabilized and transferred to hospitalist service on 3/8.IR was consulted who performe ultrasound-guided thoracentesis on3/03/2020, aspirated 14 mL of purulent material. Culture grew MRSA.CT of the abdomen and pelvis 3/11/2021showed Continued significant enlargement of right ileo psoas muscle with multiple irregular and peripherally enhancing low densities throughout its course, most consistent with abscesses.IR was reconsultedto perform gluteal abscess and right iliopsoas abscess needle aspiration on 08/11/2019.  She now has a right-sided psoas drain in place.  Subjective:  Came out of the shower, sitting on edge of bed comfortably.  No acute issues.  SBP in the 90s.  Seen by ID.Mother at bedside.  Objective: Vitals:   08/20/19 1552  08/20/19 2112 08/21/19 0616 08/21/19 1500  BP: (!) 92/58 93/61 90/63  98/67  Pulse: (!) 101 88  (!) 114  Resp: 19 19 14 16   Temp: 98.7 F (37.1 C) 98 F (36.7 C) 99.1 F (37.3 C) 99.8 F (37.7 C)  TempSrc: Oral Oral Oral Oral  SpO2:  100% 97% 97%  Weight:   65.1 kg   Height:        Intake/Output Summary (Last 24 hours) at 08/21/2019 1718 Last data filed at 08/21/2019 1556 Gross per 24 hour  Intake 877 ml  Output 335 ml  Net 542 ml   Filed Weights   08/18/19 0416 08/20/19 0605 08/21/19 0616  Weight: 64.4 kg 64.3 kg 65.1 kg    Physical Examination:  General exam: Appears calm and comfortable  Respiratory system: Clear to auscultation. Respiratory effort normal. Cardiovascular system: S1 & S2 heard, RRR. No JVD, murmurs, rubs, gallops or clicks. No pedal edema. Gastrointestinal system: Abdomen is nondistended, soft and nontender. Normal bowel sounds heard. Central nervous system: Alert and oriented. No new focal neurological deficits. Extremities: PICC line in left arm,R hand normal appearing with normal ROM.  R clavicle without any residual swelling/erythema R hip dressed with drain in place - yellow purulent drainage in bulb suction. Psychiatry: Judgement and insight appear normal. Mood & affect appropriate.   Data Reviewed: I have personally reviewed following labs and imaging studies  CBC: Recent Labs  Lab 08/18/19 0620 08/21/19 0458  WBC 5.0 6.1  NEUTROABS 2.9 4.1  HGB 9.2* 9.5*  HCT 28.6* 30.2*  MCV 85.1 86.0  PLT 443* Q000111Q   Basic Metabolic Panel: Recent Labs  Lab 08/17/19 0500 08/18/19 0620 08/19/19 0500 08/20/19 0500 08/21/19 0458  NA 136 137 135 136 138  K 4.1 4.3 3.5 4.0 3.9  CL 97* 98 98 100 100  CO2 29 30 27 26 27   GLUCOSE 106* 106* 135* 111* 115*  BUN 7 8 8 9 10   CREATININE 0.55 0.54 0.62 0.66 0.84  CALCIUM 8.7* 8.8* 8.7* 8.9 8.9  MG  --  2.0  --  2.0  --    GFR: Estimated Creatinine Clearance: 81.5 mL/min (by C-G formula based on SCr of  0.84 mg/dL). Liver Function Tests: Recent Labs  Lab 08/18/19 0620  AST 21  ALT 45*  ALKPHOS 113  BILITOT 0.4  PROT 7.0  ALBUMIN 2.2*   No results for input(s): LIPASE, AMYLASE in the last 168 hours. No results for input(s): AMMONIA in the last 168 hours. Coagulation Profile: No results for input(s): INR, PROTIME in the last 168 hours. Cardiac Enzymes: Recent Labs  Lab 08/17/19 0500  CKTOTAL 18*   BNP (last 3 results) No results for input(s): PROBNP in the last 8760 hours. HbA1C: No results for input(s): HGBA1C in the last 72 hours. CBG: No results for input(s): GLUCAP in the last 168 hours. Lipid Profile: No results for input(s): CHOL, HDL, LDLCALC, TRIG, CHOLHDL, LDLDIRECT in the last 72 hours. Thyroid Function Tests: No results for input(s): TSH, T4TOTAL, FREET4, T3FREE, THYROIDAB in the last 72 hours. Anemia Panel: No results for input(s): VITAMINB12, FOLATE, FERRITIN, TIBC, IRON, RETICCTPCT in the last 72 hours. Sepsis Labs: No results for input(s): PROCALCITON, LATICACIDVEN in the last 168 hours.  Recent Results (from the past 240 hour(s))  Culture, Urine     Status: None   Collection Time: 08/17/19  1:27 PM   Specimen: Urine, Clean Catch  Result Value Ref Range Status   Specimen Description URINE, CLEAN CATCH  Final   Special Requests NONE  Final   Culture   Final    NO GROWTH Performed at [facility], [address] [place], [place] [address]    Report Status 08/18/2019 FINAL  Final      Radiology Studies: No results found.      Scheduled Meds: . Chlorhexidine Gluconate Cloth  6 each Topical Daily  . docusate sodium  100 mg Oral BID  . DULoxetine  30 mg Oral Daily  . enoxaparin (LOVENOX) injection  40 mg Subcutaneous QHS  . feeding supplement (ENSURE ENLIVE)  237 mL Oral TID BM  . gabapentin  300 mg Oral TID  . polyethylene glycol  17 g Oral Daily  . QUEtiapine  150 mg Oral QHS  . sodium chloride flush  10-40 mL Intracatheter Q12H  .  traZODone  150 mg Oral QHS   Continuous Infusions: . sodium chloride 10 mL/hr (08/20/19 0600)  . vancomycin 1,000 mg (08/21/19 1127)   Assessment and plan:   1.  Disseminated MRSA infection/bacteremia with sepsis secondary to iliopsoas/gluteal abscess.  With complications including but not limited to sacral osteomyelitis/myositis/septic arthritis of the right SI joint.  Also possible infection of the right dorsum hand and right sternoclavicular joint. Continue IV vancomycin per ID recommendations for 6 weeks-stop date 4/14..  She is not a candidate for home IV therapy given IVDA.  She is currently on day 19 of IV vancomycin (started on 3/3).  Appreciate ID, IR and orthopedics (Dr. Judee Clara) input.  Patient is s/p right psoas drain on 3.12.21 - IR recommended to continue with flushing TID, output recording q shift and dressing changes as needed. Would consider additional imaging when output is  less than 10 ml for 24 hours not including flush material.  2. Right chest empyema, thoracic surgery was initially consulted, thoracic surgery recommended IR consult due to lack of accessible fluid collection. IR evaluated patient, s/p ultrasound guided thoracentesis with removal of 13 cc of purulent fluid on March 10, collection was too small the time no chest tube placement recommended.ID and thoracic surgery following-continue medical management with IV vancomycin.  3. Peritonitis/IUD perforation:CT scan of the abdomen her abdominal exam is benign. Gynecology and general surgery was consulted and they have signed off.They recommended no surgical intervention at this time.  4. Bipolar disorder/anxiety/depression:Home medications Cymbalta, Seroquel trazodone were held initially but now resumed.  5. Hypokalemia, continue replace , check mag .   6. Thrombocytosis: Likely reactive, improving  7. Normocytic anemia, in the setting of acute illness.  Hemoglobin has been stable about 9 the last few days.   Monitor.    8.  Polysubstance abuse: Patient has h/o IVDU and smoking.Previously documented narcotic seeking behavior, please refer notes on March 16 by Dr. Aileen Fass.  She expressed interest in starting Suboxone when her pain gets better in the next few days, continue current pain regimen for now.  DVT prophylaxis: Lovenox CODE STATUS: Full code Communication: Discussed with patient/mother at bedside in detail and all questions answered to satisfaction. Consultants : ID, IR, orthopedics, CT surgery, PCCM. Disposition: Patient is from home prior to admission.  She is receiving inpatient IV antibiotics as not a candidate for home IV therapy until 09/13/2019.  She will be discharged home after completion of IV antibiotics, however expresses plan to move to [place] to live with her dad eventually.   LOS: 18 days    Time spent: 25 minutes    Guilford Shi, MD [facility] Pager in [place]  If 7PM-7AM, please contact night-coverage [email or web address] 08/21/2019, 5:18 PM

## 2019-08-21 NOTE — Progress Notes (Signed)
Pharmacy Antibiotic Note  Leslie Molina is a 34 y.o. female admitted on 08/03/2019 with MRSA bacteremia/abscess w/ osteo of saacrum and ischium. Patient continuing on vancomycin for disseminated MRSA infection. ID following and plans are for a total of 6 weeks antibiotics (end date 09/13/19) -WBC= 6.1, SCr= 0.8   Plan: Continue vancomycin 1000mg  IV q8h  Repeat vancomycin levels later this week  Height: 5\' 1"  (154.9 cm) Weight: 143 lb 8.3 oz (65.1 kg) IBW/kg (Calculated) : 47.8  Temp (24hrs), Avg:98.6 F (37 C), Min:98 F (36.7 C), Max:99.1 F (37.3 C)  Recent Labs  Lab 08/16/19 1109 08/16/19 1109 08/16/19 1504 08/17/19 0500 08/18/19 0620 08/18/19 1130 08/19/19 0500 08/20/19 0500 08/21/19 0458  WBC  --   --   --   --  5.0  --   --   --  6.1  CREATININE 0.52   < >  --  0.55 0.54  --  0.62 0.66 0.84  VANCOTROUGH 14*  --   --   --   --  11*  --   --   --   VANCOPEAK  --   --  34  --  34  --   --   --   --    < > = values in this interval not displayed.    Estimated Creatinine Clearance: 81.5 mL/min (by C-G formula based on SCr of 0.84 mg/dL).    No Known Allergies  Antimicrobials this admission: 3/3 metronidazole x 1 3/3 vancomycin>>3/11 3/3 cefepime>>3/5 3/4 clindamycin >>3/5 3/11 Ceftaroline>>3/15 3/4 daptomycin 500mg  x 1; resume 3/11 >> 3/15  3/15 vancomycin >>  Microbiology results: 3/4 repeats: staph aureus 3/3BCx:GPC clusters 4/4 bottles (BCID = MRSA) 3/3 iliopsoas abscess:  MRSA  3/6 blood x2- neg 3/10 pleural fluid- staph aureus 3/12 wound culture - staph aureus   Thank you for allowing pharmacy to be a part of this patient's care.  Hildred Laser, PharmD Clinical Pharmacist **Pharmacist phone directory can now be found on [email or web address] ([email or web address]).  Listed under [place].

## 2019-08-22 LAB — C-REACTIVE PROTEIN: CRP: 5.9 mg/dL — ABNORMAL HIGH (ref <1.0)

## 2019-08-22 LAB — SEDIMENTATION RATE: Sed Rate: 105 mm/hr — ABNORMAL HIGH (ref 0–22)

## 2019-08-22 MED ORDER — GABAPENTIN 300 MG PO CAPS
300.0000 mg | ORAL_CAPSULE | Freq: Three times a day (TID) | ORAL | Status: DC
  Administered 2019-08-22 – 2019-09-03 (×35): 300 mg via ORAL
  Filled 2019-08-22 (×35): qty 1

## 2019-08-22 NOTE — Progress Notes (Signed)
PROGRESS NOTE    Leslie Molina  [id number]  DOB: 1985-07-10  PCP: [facility] [place] 34 y.o.femalepast medical history of bipolar disorder anxiety and IV drug abuse presents to the emergency room twice within a week with lower back pain she is found to have a temperature of 102 and right extremity pain an MRI of the right hip showed extensive right iliopsoas abscess with extensive myositis and osteomyelitis of the right sacrum and iliac bone. The abscess extends to the posterior aspect of the proximal right thigh.  Patient initially accepted to ICU on 3/4 from [facility] and concern for septic shock and for surgical/GYN input.  During the ICU course her blood cultures were positive for MRSA, pelvic abscess culture also showed abundant staph aureus on 3/3.  She received 1 dose of daptomycin/clindamycin/cefepime on 3/3, subsequently managed with IV vancomycin only from 3/3 to now. CT of the neck on 3/7/2021showed multiple peripheral predominant nodules at the lung apices suspicious for septic emboli and a right empyema with inflammation and stranding on the right retroclavicular region and right lower neck consistent with soft tissue infection. She was stabilized and transferred to hospitalist service on 3/8.IR was consulted who performe ultrasound-guided thoracentesis on3/03/2020, aspirated 14 mL of purulent material. Culture grew MRSA.CT of the abdomen and pelvis 3/11/2021showed Continued significant enlargement of right ileo psoas muscle with multiple irregular and peripherally enhancing low densities throughout its course, most consistent with abscesses.IR was reconsultedto perform gluteal abscess and right iliopsoas abscess needle aspiration on 08/11/2019.  She now has a right-sided psoas drain in place.  Subjective:  No acute issues.  SBP in the 90s.  Seen by dietician for f/u. Seen for lock- PICC trial   Objective: Vitals:   08/21/19 2126 08/22/19 0603 08/22/19 0821 08/22/19  1356  BP: (!) 91/53 (!) 90/57 98/63 98/66   Pulse: 99 (!) 105  (!) 110  Resp:      Temp: 98.1 F (36.7 C) 99.5 F (37.5 C)  99.3 F (37.4 C)  TempSrc: Oral Oral  Oral  SpO2: 100% 96%  98%  Weight:  65.4 kg    Height:        Intake/Output Summary (Last 24 hours) at 08/22/2019 1749 Last data filed at 08/22/2019 1113 Gross per 24 hour  Intake 262 ml  Output 2303 ml  Net -2041 ml   Filed Weights   08/20/19 0605 08/21/19 0616 08/22/19 0603  Weight: 64.3 kg 65.1 kg 65.4 kg    Physical Examination:  General exam: Appears calm and comfortable  Respiratory system: Clear to auscultation. Respiratory effort normal. Cardiovascular system: S1 & S2 heard, RRR. No JVD, murmurs, rubs, gallops or clicks. No pedal edema. Gastrointestinal system: Abdomen is nondistended, soft and nontender. Normal bowel sounds heard. Central nervous system: Alert and oriented. No new focal neurological deficits. Extremities: PICC line in left arm,R hand normal appearing with normal ROM.  R clavicle without any residual swelling/erythema R hip dressed with drain in place - yellow purulent drainage in bulb suction. Psychiatry: Judgement and insight appear normal. Mood & affect appropriate.   Data Reviewed: I have personally reviewed following labs and imaging studies  CBC: Recent Labs  Lab 08/18/19 0620 08/21/19 0458  WBC 5.0 6.1  NEUTROABS 2.9 4.1  HGB 9.2* 9.5*  HCT 28.6* 30.2*  MCV 85.1 86.0  PLT 443* Q000111Q   Basic Metabolic Panel: Recent Labs  Lab 08/17/19 0500 08/18/19 0620 08/19/19 0500 08/20/19 0500 08/21/19 0458  NA 136 137 135 136 138  K 4.1 4.3 3.5 4.0 3.9  CL 97* 98 98 100 100  CO2 29 30 27 26 27   GLUCOSE 106* 106* 135* 111* 115*  BUN 7 8 8 9 10   CREATININE 0.55 0.54 0.62 0.66 0.84  CALCIUM 8.7* 8.8* 8.7* 8.9 8.9  MG  --  2.0  --  2.0  --    GFR: Estimated Creatinine Clearance: 81.6 mL/min (by C-G formula based on SCr of 0.84 mg/dL). Liver Function Tests: Recent Labs  Lab  08/18/19 0620  AST 21  ALT 45*  ALKPHOS 113  BILITOT 0.4  PROT 7.0  ALBUMIN 2.2*   No results for input(s): LIPASE, AMYLASE in the last 168 hours. No results for input(s): AMMONIA in the last 168 hours. Coagulation Profile: No results for input(s): INR, PROTIME in the last 168 hours. Cardiac Enzymes: Recent Labs  Lab 08/17/19 0500  CKTOTAL 18*   BNP (last 3 results) No results for input(s): PROBNP in the last 8760 hours. HbA1C: No results for input(s): HGBA1C in the last 72 hours. CBG: No results for input(s): GLUCAP in the last 168 hours. Lipid Profile: No results for input(s): CHOL, HDL, LDLCALC, TRIG, CHOLHDL, LDLDIRECT in the last 72 hours. Thyroid Function Tests: No results for input(s): TSH, T4TOTAL, FREET4, T3FREE, THYROIDAB in the last 72 hours. Anemia Panel: No results for input(s): VITAMINB12, FOLATE, FERRITIN, TIBC, IRON, RETICCTPCT in the last 72 hours. Sepsis Labs: No results for input(s): PROCALCITON, LATICACIDVEN in the last 168 hours.  Recent Results (from the past 240 hour(s))  Culture, Urine     Status: None   Collection Time: 08/17/19  1:27 PM   Specimen: Urine, Clean Catch  Result Value Ref Range Status   Specimen Description URINE, CLEAN CATCH  Final   Special Requests NONE  Final   Culture   Final    NO GROWTH Performed at [facility], [address] [place], [place] [address]    Report Status 08/18/2019 FINAL  Final      Radiology Studies: No results found.      Scheduled Meds: . Chlorhexidine Gluconate Cloth  6 each Topical Daily  . docusate sodium  100 mg Oral BID  . DULoxetine  30 mg Oral Daily  . enoxaparin (LOVENOX) injection  40 mg Subcutaneous QHS  . feeding supplement (ENSURE ENLIVE)  237 mL Oral TID BM  . gabapentin  300 mg Oral TID  . nicotine  14 mg Transdermal Daily  . polyethylene glycol  17 g Oral Daily  . QUEtiapine  150 mg Oral QHS  . sodium chloride flush  10-40 mL Intracatheter Q12H  . traZODone  150  mg Oral QHS   Continuous Infusions: . sodium chloride 10 mL/hr (08/20/19 0600)  . vancomycin 1,000 mg (08/22/19 1245)   Assessment and plan:   1.  Disseminated MRSA infection/bacteremia with sepsis secondary to iliopsoas/gluteal abscess.  With complications including but not limited to sacral osteomyelitis/myositis/septic arthritis of the right SI joint.  Also possible infection of the right dorsum hand and right sternoclavicular joint. Continue IV vancomycin per ID recommendations for 6 weeks-stop date 4/14..  She is not a candidate for home IV therapy given IVDA.  She is currently on day 19 of IV vancomycin (started on 3/3).  Appreciate ID, IR and orthopedics (Dr. Judee Clara) input.  Patient is s/p right psoas drain on 3.12.21 - IR recommended to continue with flushing TID, output recording q shift and dressing changes as needed. Would consider additional imaging when output  is less than 10 ml for 24 hours not including flush material.  2. Right chest empyema, thoracic surgery was initially consulted, thoracic surgery recommended IR consult due to lack of accessible fluid collection. IR evaluated patient, s/p ultrasound guided thoracentesis with removal of 13 cc of purulent fluid on March 10, collection was too small the time no chest tube placement recommended.ID and thoracic surgery following-continue medical management with IV vancomycin.  3. Peritonitis/IUD perforation:CT scan of the abdomen her abdominal exam is benign. Gynecology and general surgery was consulted and they have signed off.They recommended no surgical intervention at this time.  4. Bipolar disorder/anxiety/depression:Home medications Cymbalta, Seroquel trazodone were held initially but now resumed.  5. Hypokalemia, continue replace , check mag .   6. Thrombocytosis: Likely reactive, improving  7. Normocytic anemia, in the setting of acute illness.  Hemoglobin has been stable about 9 the last few days.  Monitor.     8.  Polysubstance abuse: Patient has h/o IVDU and smoking.Previously documented narcotic seeking behavior, please refer notes on March 16 by Dr. Aileen Fass.  She expressed interest in starting Suboxone when her pain gets better in the next few days, continue current pain regimen for now.  9. Moderate Protein calorie malnutrition: Albumin level at 2.2. Ensure Enlive po TID, each supplement provides 350 kcal and 20 grams of protein per RD  DVT prophylaxis: Lovenox CODE STATUS: Full code Communication: Discussed with patient/mother at bedside in detail and all questions answered to satisfaction. Consultants : ID, IR, orthopedics, CT surgery, PCCM. Disposition: Patient is from home prior to admission.  She is receiving inpatient IV antibiotics as not a candidate for home IV therapy until 09/13/2019.  She will be discharged home after completion of IV antibiotics, however expresses plan to move to [place] to live with her dad eventually.   LOS: 19 days    Time spent: 15 minutes    Guilford Shi, MD [facility] Pager in [place]  If 7PM-7AM, please contact night-coverage [email or web address] 08/22/2019, 5:49 PM

## 2019-08-22 NOTE — Progress Notes (Addendum)
Nutrition Follow-up  DOCUMENTATION CODES:   Not applicable  INTERVENTION:  Continue Ensure Enlive po TID, each supplement provides 350 kcal and 20 grams of protein    NUTRITION DIAGNOSIS:   Inadequate oral intake related to decreased appetite as evidenced by per patient/family report.  Ongoing.  GOAL:   Patient will meet greater than or equal to 90% of their needs  Progressing.  MONITOR:   PO intake, Labs, Supplement acceptance, Skin, Weight trends, I & O's  REASON FOR ASSESSMENT:   Malnutrition Screening Tool    ASSESSMENT:   3/12 MST, Pt with a PMH significant for bipolar disorder, anxiety and IV drug abuse presented with lower back pain and fever. Pt admitted with sepsis secondary to MRSA bacteremia and iliopsoas abscess/osteomyelitis of the sacrum/myositis and empyema. Pt also with Iliopsoas/gluteal abscess/osteomyelitis of the sacrum and possible infection of the right dorsum hand and right sternoclavicular joint infection  3/10 thoracentesis, 43mL purulent fluid aspirated 3/12 aspiration of gluteal abscess   Pt reports appetite is good, but expresses dissatisfaction with the Heart Healthy diet. Pt reports drinking her Ensures.   PO Intake: 50-100% x last 8 recorded meals (78% average intake)  UOP: 1,772ml x24 hours I/O: -11,017.49ml since 08/08/19  Labs reviewed.  Medications reviewed and include: Colace, Ensure Enlive TID, Miralax, IV abx   Diet Order:   Diet Order            Diet Heart Room service appropriate? Yes; Fluid consistency: Thin  Diet effective now              EDUCATION NEEDS:   No education needs have been identified at this time  Skin:  Skin Assessment: Skin Integrity Issues: Skin Integrity Issues:: Other (Comment) Other: throat puncture  Last BM:  3/22  Height:   Ht Readings from Last 1 Encounters:  08/05/19 5\' 1"  (1.549 m)    Weight:   Wt Readings from Last 1 Encounters:  08/22/19 65.4 kg    BMI:  Body mass index  is 27.24 kg/m.  Estimated Nutritional Needs:   Kcal:  1700-1900  Protein:  85-100 grams  Fluid:  >/= 1.7 L   Larkin Ina, MS, RD, LDN RD pager number and weekend/on-call pager number located in [place].

## 2019-08-22 NOTE — TOC Progression Note (Signed)
Transition of Care [facility]) - Progression Note    Patient Details  Name: Leslie Molina MRN: [id number] Date of Birth: 1986/02/27  Transition of Care [facility]) CM/SW [place], [place] Phone Number: 08/22/2019, 3:20 PM  Clinical Narrative:    CSW was asked to write a letter to the court to verify pt's inpatient date at hospital. A copy of the note was placed on pt's chart.  CSW faxed information to Malachy Chamber at [phone].  No further needs noted. Please consult if further needs arise.        Expected Discharge Plan and Services                                                 Social Determinants of Health (SDOH) Interventions    Readmission Risk Interventions No flowsheet data found.

## 2019-08-22 NOTE — Progress Notes (Signed)
PICC guard locking device placed on PICC line. [employer]

## 2019-08-23 ENCOUNTER — Inpatient Hospital Stay (HOSPITAL_COMMUNITY): Payer: Medicaid Other

## 2019-08-23 LAB — BASIC METABOLIC PANEL
Anion gap: 12 (ref 5–15)
BUN: 8 mg/dL (ref 6–20)
CO2: 27 mmol/L (ref 22–32)
Calcium: 8.8 mg/dL — ABNORMAL LOW (ref 8.9–10.3)
Chloride: 97 mmol/L — ABNORMAL LOW (ref 98–111)
Creatinine, Ser: 0.66 mg/dL (ref 0.44–1.00)
GFR calc Af Amer: >60 mL/min (ref >60)
GFR calc non Af Amer: >60 mL/min (ref >60)
Glucose, Bld: 123 mg/dL — ABNORMAL HIGH (ref 70–99)
Potassium: 3.9 mmol/L (ref 3.5–5.1)
Sodium: 136 mmol/L (ref 135–145)

## 2019-08-23 IMAGING — CT CT ABD-PELV W/ CM
2 of 4 series · 16 of 46 positions shown, 18 images · IV contrast (omnipaque)
Comparison: [DATE]

CLINICAL DATA: Psoas muscle abscess, post drain placement
[DATE]

EXAM:
CT ABDOMEN AND PELVIS WITH CONTRAST
TECHNIQUE: Multidetector CT imaging of the abdomen and pelvis was performed
using the standard protocol following bolus administration of
intravenous contrast.
CONTRAST:  100mL OMNIPAQUE IOHEXOL 300 MG/ML  SOLN

[Series 3: abd/ pelvis 5.0 i30f 2 · axial · 0.86mm/px · z∈[+682,+1107]mm · 13 of 93 slices shown, 15 images]
[im 4/93  soft-tissue]
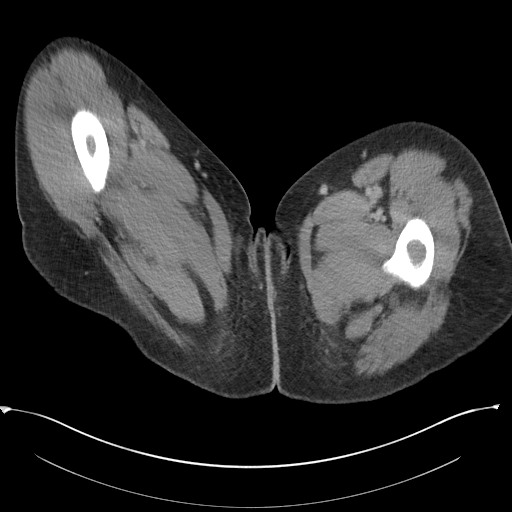
[im 4/93  bone]
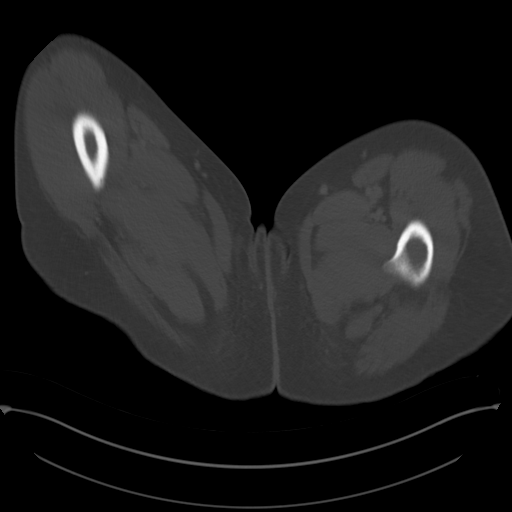
[im 11/93  soft-tissue]
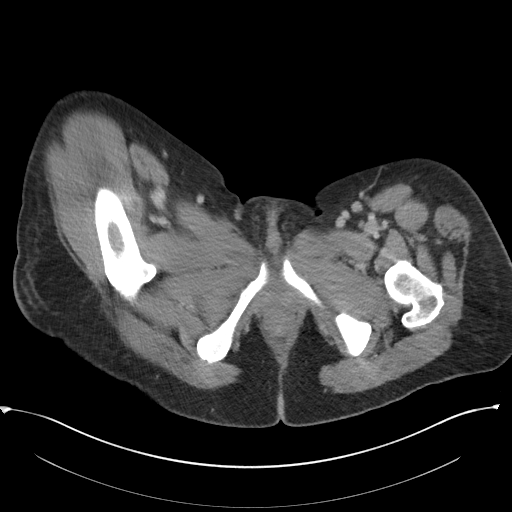
[im 18/93  soft-tissue]
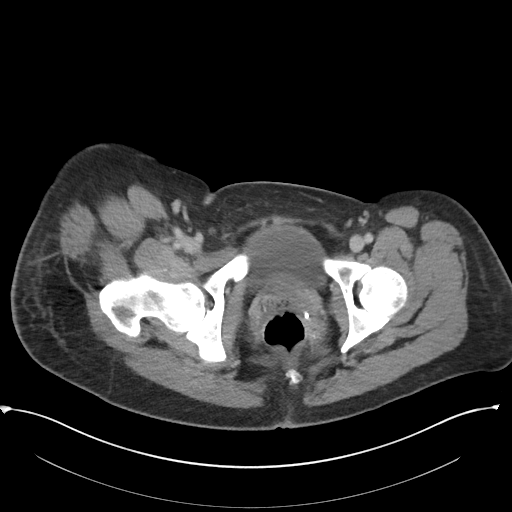
[im 25/93  soft-tissue]
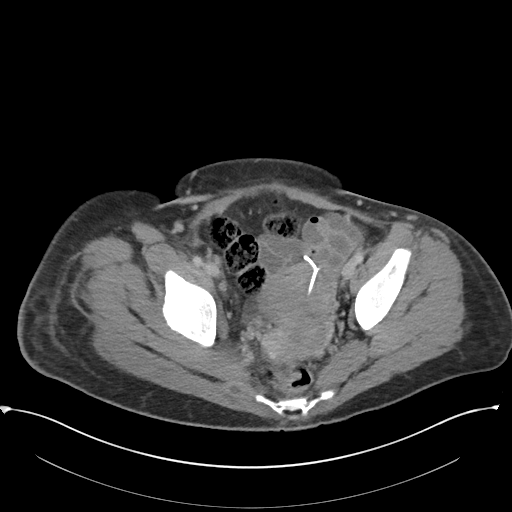
[im 32/93  soft-tissue]
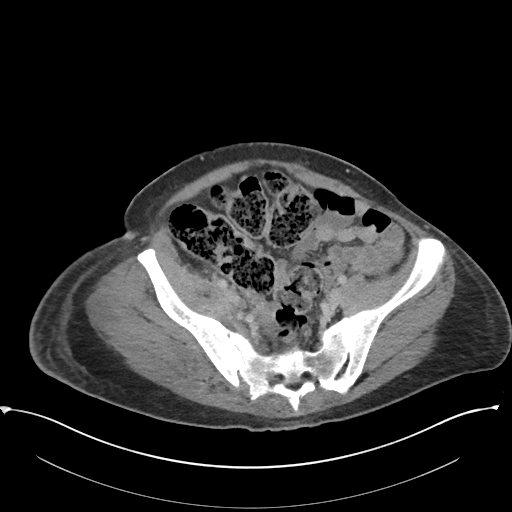
[im 39/93  soft-tissue]
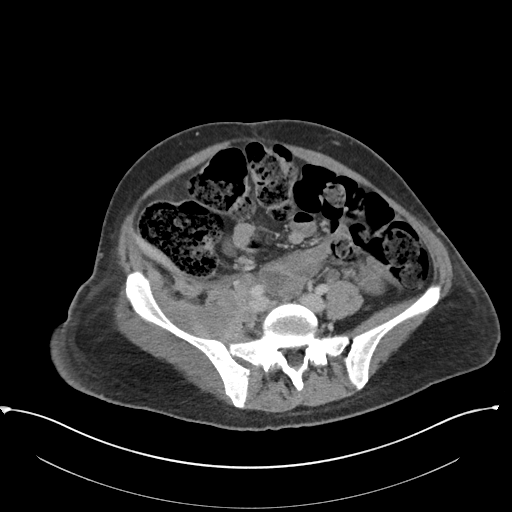
[im 47/93  soft-tissue]
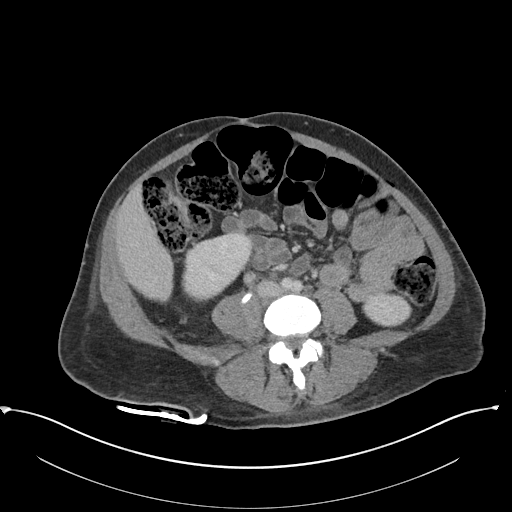
[im 54/93  soft-tissue]
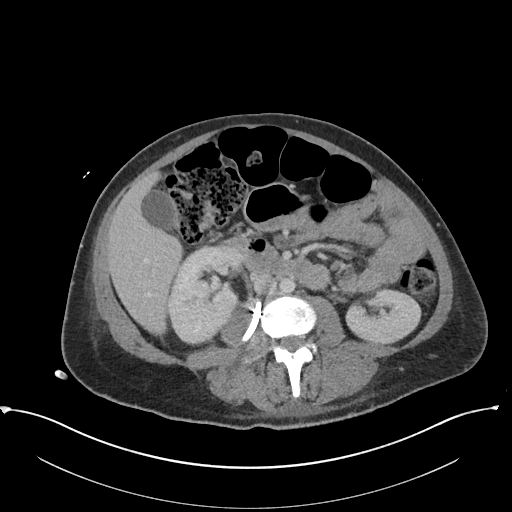
[im 61/93  soft-tissue]
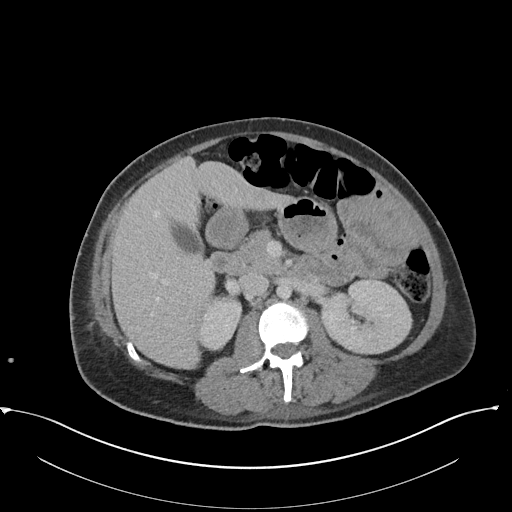
[im 61/93  bone]
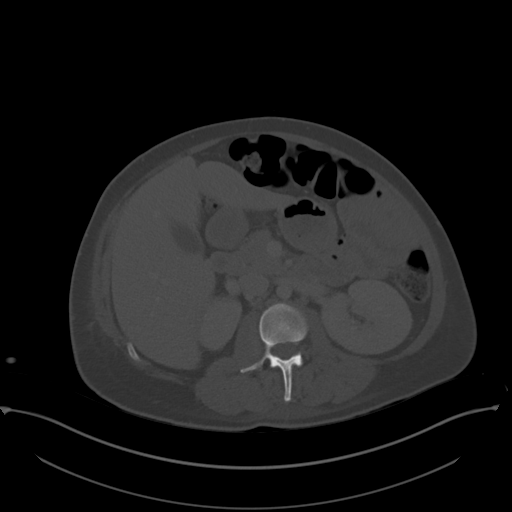
[im 68/93  soft-tissue]
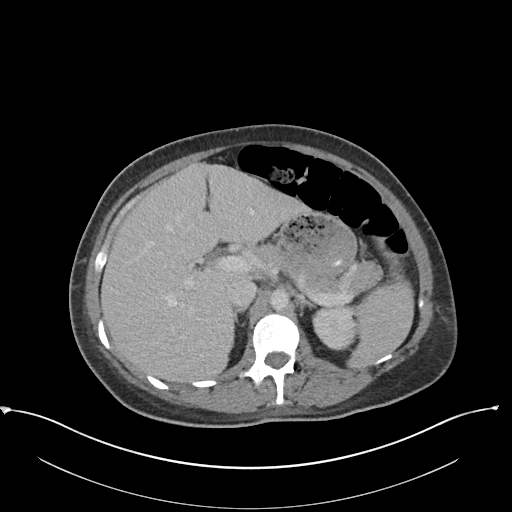
[im 75/93  soft-tissue]
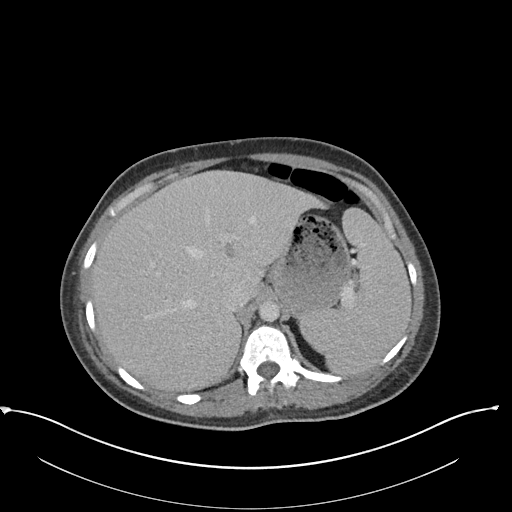
[im 82/93  soft-tissue]
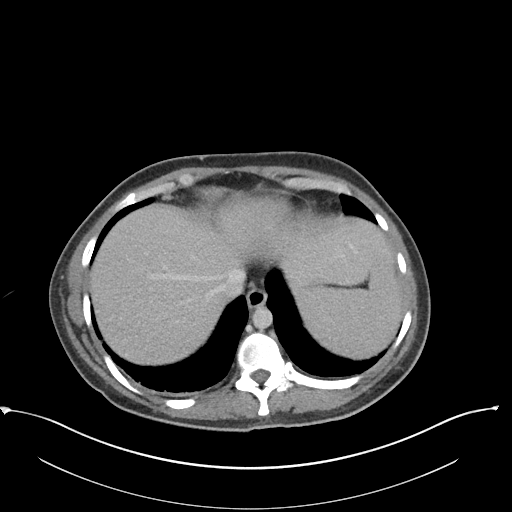
[im 89/93  soft-tissue]
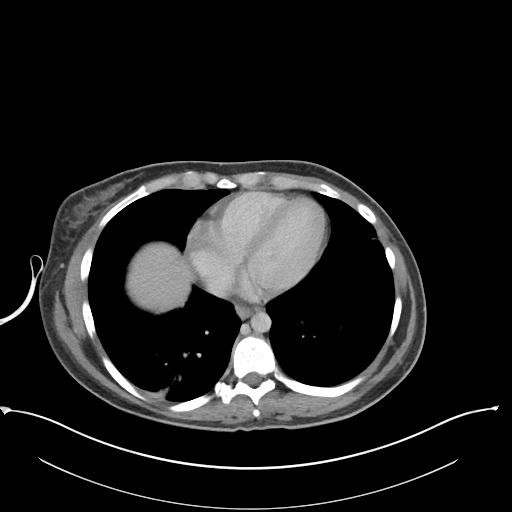

[Series 6: coronal soft tissue · coronal · 0.85mm/px · 3 of 101 slices shown]
[im 34/101  soft-tissue]
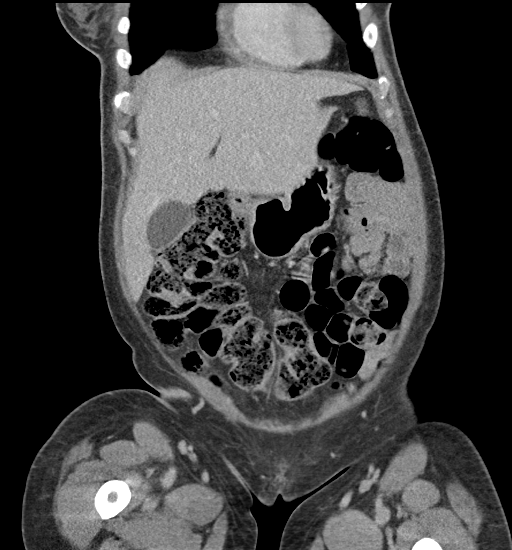
[im 45/101  soft-tissue]
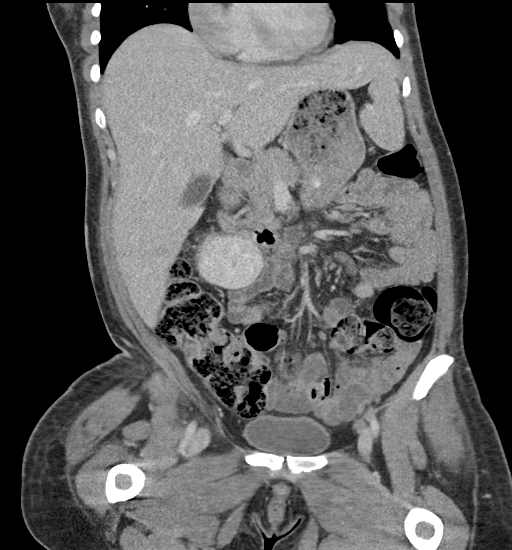
[im 56/101  soft-tissue]
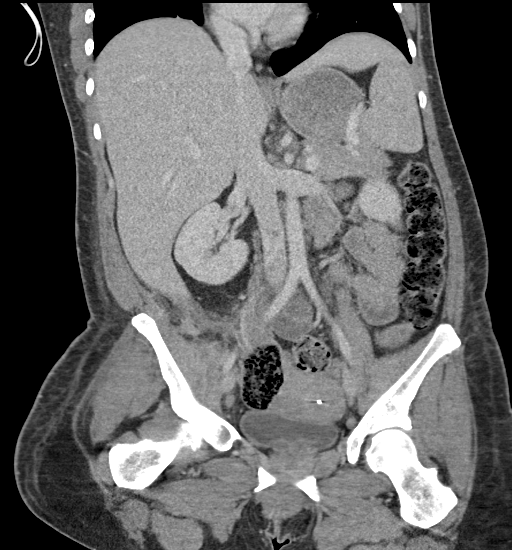

[16 of 46 positions shown; findings below may reference images not displayed]

FINDINGS: Lower chest: Platelike scarring or atelectasis at the right lung
base, slightly improved. No pleural or pericardial effusion.

Hepatobiliary: No focal liver abnormality is seen. No gallstones,
gallbladder wall thickening, or biliary dilatation.

Pancreas: Unremarkable. No pancreatic ductal dilatation or
surrounding inflammatory changes.

Spleen: Normal in size without focal abnormality.

Adrenals/Urinary Tract: Adrenals unremarkable. Kidneys enhance
normally without hydronephrosis. Urinary bladder incompletely
distended.

Stomach/Bowel: Stomach is partially distended by ingested material.
Small bowel is nondilated. Moderate colonic fecal material without
dilatation or other acute finding.

Vascular/Lymphatic: No significant vascular findings are present. No
enlarged abdominal or pelvic lymph nodes.

Reproductive: IUD in place, right limb extending beyond the margin
of the uterus. No adnexal mass.

Other: No ascites.  No free air.

Stable right retroperitoneal pigtail drain catheter. Significant
interval improvement in multiloculated right iliopsoas and gluteal
fluid collections. 1.7 cm small intramuscular residual pocket in the
inferior right psoas, previously 3 cm. 6.7 cm linear residual
collection in the deep gluteal musculature. Decrease in overall
thickening of the right iliacus musculature.

Musculoskeletal: No fracture or worrisome bone lesion. No CT
suggestion of discitis.
IMPRESSION: 1. Significant improvement in right iliopsoas and gluteal
multiloculated abscess post percutaneous drainage. Scattered small
residual collections, too small for drain catheter.

## 2019-08-23 MED ORDER — SIMETHICONE 80 MG PO CHEW
80.0000 mg | CHEWABLE_TABLET | Freq: Four times a day (QID) | ORAL | Status: DC | PRN
  Administered 2019-08-23 – 2019-09-02 (×4): 80 mg via ORAL
  Filled 2019-08-23 (×4): qty 1

## 2019-08-23 MED ORDER — IOHEXOL 300 MG/ML  SOLN
100.0000 mL | Freq: Once | INTRAMUSCULAR | Status: AC | PRN
  Administered 2019-08-23: 100 mL via INTRAVENOUS

## 2019-08-23 NOTE — Progress Notes (Signed)
Referring Physician(s): Dr Dillard Cannon  Supervising Physician: Arne Cleveland  Patient Status:  [facility] - In-pt  Chief Complaint:  Psoas abscess  Brief History:  Ms. Leslie Molina is a 34 y.o.female withpast medical history of bipolar disorder, anxiety, and IV drug abuse.  She presented to the ED twice within a week with lower back pain.  She was found to have a temp of 102.   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   She underwent Successful CT-guided right psoas abscess drain placement and CT-guided right gluteal abscess aspiration by Dr. Annamaria Boots on 08/11/19.  Blood cultures were positive for MRSA.  Abscess culture also showed MRSA.  Subjective:  She is sitting up in bed. No complaints.   Allergies: Patient has no known allergies.  Medications: Prior to Admission medications   Medication Sig Start Date End Date Taking? Authorizing Provider  acetaminophen (TYLENOL) 325 MG tablet Take 2 tablets (650 mg total) by mouth every 6 (six) hours as needed for fever. 08/03/19  Yes Nolberto Hanlon, MD  ibuprofen (ADVIL) 200 MG tablet Take 200 mg by mouth every 6 (six) hours as needed for moderate pain.   Yes [provider]  ceFEPIme 2 g in sodium chloride 0.9 % 100 mL Inject 2 g into the vein every 8 (eight) hours. Patient not taking: Reported on 08/03/2019 08/03/19   Nolberto Hanlon, MD  Chlorhexidine Gluconate Cloth 2 % PADS Apply 6 each topically daily. Patient not taking: Reported on 08/03/2019 08/03/19   Nolberto Hanlon, MD  clindamycin (CLEOCIN) 900 MG/50ML IVPB Inject 50 mLs (900 mg total) into the vein every 8 (eight) hours. Patient not taking: Reported on 08/03/2019 08/03/19   Nolberto Hanlon, MD  Dextrose-Sodium Chloride (DEXTROSE 5 % AND 0.45% NACL) infusion Inject 150 mL/hr into the vein continuous. Patient not taking: Reported on 08/03/2019 08/03/19   Nolberto Hanlon, MD  enoxaparin (LOVENOX) 40 MG/0.4ML injection Inject  0.4 mLs (40 mg total) into the skin daily. Patient not taking: Reported on 08/03/2019 08/03/19   Nolberto Hanlon, MD  norepinephrine (LEVOPHED) 4-5 MG/250ML-% SOLN Inject 0-40 mcg/min into the vein continuous. Patient not taking: Reported on 08/03/2019 08/03/19   Nolberto Hanlon, MD  oxyCODONE (OXY IR/ROXICODONE) 5 MG immediate release tablet Take 1 tablet (5 mg total) by mouth every 6 (six) hours as needed for breakthrough pain. Patient not taking: Reported on 08/03/2019 08/03/19   Nolberto Hanlon, MD  potassium chloride 10 MEQ/100ML Inject 100 mLs (10 mEq total) into the vein every 1 hour x 6 doses. Patient not taking: Reported on 08/03/2019 08/03/19   Nolberto Hanlon, MD  vancomycin (VANCOREADY) 750 MG/150ML SOLN Inject 150 mLs (750 mg total) into the vein every 8 (eight) hours. Patient not taking: Reported on 08/03/2019 08/03/19   Nolberto Hanlon, MD     Vital Signs: BP 97/62 (BP Location: Right Arm)   Pulse (!) 112   Temp 98.3 F (36.8 C) (Oral)   Resp 20   Ht 5\' 1"  (1.549 m)   Wt 66.3 kg   LMP  (LMP Unknown)   SpO2 98%   BMI 27.62 kg/m   Physical Exam Vitals reviewed.  Constitutional:      Appearance: Normal appearance.  Cardiovascular:     Rate and Rhythm: Normal rate.  Pulmonary:     Effort: Pulmonary effort is normal. No respiratory distress.  Abdominal:     Palpations: Abdomen is soft.     Comments: Minimal  drainage in bulb. Clearing. ~5-8 mL output per day.  Neurological:     General: No focal deficit present.     Mental Status: She is alert and oriented to person, place, and time.  Psychiatric:        Mood and Affect: Mood normal.        Behavior: Behavior normal.        Thought Content: Thought content normal.        Judgment: Judgment normal.     Imaging: No results found.  Labs:  CBC: Recent Labs    08/13/19 0535 08/14/19 0647 08/18/19 0620 08/21/19 0458  WBC 7.7 7.0 5.0 6.1  HGB 9.1* 9.3* 9.2* 9.5*  HCT 29.5* 30.2* 28.6* 30.2*  PLT 621* 647* 443* 386    COAGS: Recent  Labs    08/03/19 0250 08/11/19 0427  INR 1.3* 1.3*    BMP: Recent Labs    08/19/19 0500 08/20/19 0500 08/21/19 0458 08/23/19 0425  NA 135 136 138 136  K 3.5 4.0 3.9 3.9  CL 98 100 100 97*  CO2 27 26 27 27   GLUCOSE [id number]* 111* 115* 123*  BUN 8 9 10 8   CALCIUM 8.7* 8.9 8.9 8.8*  CREATININE 0.62 0.66 0.84 0.66  GFRNONAA >60 >60 >60 >60  GFRAA >60 >60 >60 >60    LIVER FUNCTION TESTS: Recent Labs    08/12/19 1317 08/13/19 0535 08/14/19 0647 08/18/19 0620  BILITOT 0.2* 0.3 0.4 0.4  AST 25 56* 43* 21  ALT 45* 69* 74* 45*  ALKPHOS 183* 164* 149* 113  PROT 6.9 6.6 7.0 7.0  ALBUMIN 2.1* 2.0* 2.0* 2.2*    Assessment and Plan:  S/P CT-guided right psoas abscess drain placement and right gluteal abscess aspiration by Dr.Shick on 08/11/19.  Now with only about 8 mL output which is clearing.  Will repeat CT scan and evaluate for resolution and hopefully remove the drain soon.  Electronically Signed: Murrell Redden, PA-C 08/23/2019, 1:50 PM    I spent a total of 15 Minutes at the the patient's bedside AND on the patient's hospital floor or unit, greater than 50% of which was counseling/coordinating care for f/u drain.

## 2019-08-23 NOTE — Progress Notes (Signed)
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 The abscess extends to the posterior aspect of the proximal right thigh.  Patient initially accepted to ICU on 3/4 from [facility] and concern for septic shock and for surgical/GYN input.  During the ICU course her blood cultures were positive for MRSA, pelvic abscess culture also showed abundant staph aureus on 3/3.  She received 1 dose of daptomycin/clindamycin/cefepime on 3/3, subsequently managed with IV vancomycin only from 3/3 to now. CT of the neck on 3/7/2021showed multiple peripheral predominant nodules at the lung apices suspicious for septic emboli and a right empyema with inflammation and stranding on the right retroclavicular region and right lower neck consistent with soft tissue infection. She was stabilized and transferred to hospitalist service on 3/8.IR was consulted who performe ultrasound-guided thoracentesis on3/03/2020, aspirated 14 mL of purulent material. Culture grew MRSA.CT of the abdomen and pelvis 3/11/2021showed Continued significant enlargement of right ileo psoas muscle with multiple irregular and peripherally enhancing low densities throughout its course, most consistent with abscesses.IR was reconsultedto perform gluteal abscess and right iliopsoas abscess needle aspiration on 08/11/2019.  She now has a right-sided psoas drain in place.  Subjective:  Patient resting comfortably and eating chips.  Drain in place with about 15 mL turbid fluid-not sure when it was emptied last.  She does report bloating  sensation Objective: Vitals:   08/22/19 2012 08/23/19 0614 08/23/19 0847 08/23/19 1442  BP: 102/63 101/66 97/62 111/64  Pulse: (!) 117 (!) 112    Resp:  20    Temp: 98.4 F (36.9 C) 98.3 F (36.8 C)  98.5 F (36.9 C)  TempSrc: Oral Oral  Oral  SpO2: 98%     Weight:  66.3 kg    Height:        Intake/Output Summary (Last 24 hours) at 08/23/2019 1715 Last data filed at 08/23/2019 1100 Gross per 24 hour  Intake 240 ml  Output 1200 ml  Net -960 ml   Filed Weights   08/21/19 0616 08/22/19 0603 08/23/19 0614  Weight: 65.1 kg 65.4 kg 66.3 kg    Physical Examination:  General exam: Appears calm and comfortable  Respiratory system: Clear to auscultation. Respiratory effort normal. Cardiovascular system: S1 & S2 heard, RRR. No JVD, murmurs, rubs, gallops or clicks. No pedal edema. Gastrointestinal system: Abdomen is mildly distended, tympanic, soft and nontender. Normal bowel sounds heard. Central nervous system: Alert and oriented. No new focal neurological deficits. Extremities: PICC line in left arm,R hand normal appearing with normal ROM.  R clavicle without any residual swelling/erythema R hip dressed with drain in place - yellow purulent drainage in bulb suction. Psychiatry: Judgement and insight appear normal. Mood & affect appropriate.   Data Reviewed: I have personally reviewed following labs and imaging studies  CBC: Recent Labs  Lab 08/18/19 0620 08/21/19 0458  WBC 5.0 6.1  NEUTROABS 2.9 4.1  HGB 9.2* 9.5*  HCT 28.6* 30.2*  MCV 85.1 86.0  PLT 443* [id number]   Basic Metabolic Panel: Recent Labs  Lab 08/18/19 0620 08/19/19 0500 08/20/19 0500 08/21/19 0458 08/23/19 0425  NA 137 135 136 138 136  K 4.3 3.5 4.0 3.9 3.9  CL 98 98 100 100 97*  CO2 30 27 26 27 27   GLUCOSE 106* 135* 111* 115* 123*  BUN 8 8 9 10 8   CREATININE 0.54 0.62 0.66 0.84 0.66  CALCIUM 8.8* 8.7* 8.9 8.9 8.8*  MG 2.0  --  2.0  --   --    GFR: Estimated Creatinine Clearance: 86.3 mL/min (by  C-G formula based on SCr of 0.66 mg/dL). Liver Function Tests: Recent Labs  Lab 08/18/19 0620  AST 21  ALT 45*  ALKPHOS 113  BILITOT 0.4  PROT 7.0  ALBUMIN 2.2*   No results for input(s): LIPASE, AMYLASE in the last 168 hours. No results for input(s): AMMONIA in the last 168 hours. Coagulation Profile: No results for input(s): INR, PROTIME in the last 168 hours. Cardiac Enzymes: Recent Labs  Lab 08/17/19 0500  CKTOTAL 18*   BNP (last 3 results) No results for input(s): PROBNP in the last 8760 hours. HbA1C: No results for input(s): HGBA1C in the last 72 hours. CBG: No results for input(s): GLUCAP in the last 168 hours. Lipid Profile: No results for input(s): CHOL, HDL, LDLCALC, TRIG, CHOLHDL, LDLDIRECT in the last 72 hours. Thyroid Function Tests: No results for input(s): TSH, T4TOTAL, FREET4, T3FREE, THYROIDAB in the last 72 hours. Anemia Panel: No results for input(s): VITAMINB12, FOLATE, FERRITIN, TIBC, IRON, RETICCTPCT in the last 72 hours. Sepsis Labs: No results for input(s): PROCALCITON, LATICACIDVEN in the last 168 hours.  Recent Results (from the past 240 hour(s))  Culture, Urine     Status: None   Collection Time: 08/17/19  1:27 PM   Specimen: Urine, Clean Catch  Result Value Ref Range Status   Specimen Description URINE, CLEAN CATCH  Final   Special Requests NONE  Final   Culture   Final    NO GROWTH Performed at [facility], [address], [place], [place] [address]    Report Status 08/18/2019 FINAL  Final      Radiology Studies: No results found.      Scheduled Meds: . Chlorhexidine Gluconate Cloth  6 each Topical Daily  . docusate sodium  100 mg Oral BID  . DULoxetine  30 mg Oral Daily  . enoxaparin (LOVENOX) injection  40 mg Subcutaneous QHS  . feeding supplement (ENSURE ENLIVE)  237 mL Oral TID BM  . gabapentin  300 mg Oral TID  . nicotine  14 mg Transdermal Daily  . polyethylene glycol  17 g Oral Daily  . QUEtiapine  150 mg  Oral QHS  . sodium chloride flush  10-40 mL Intracatheter Q12H  . traZODone  150 mg Oral QHS   Continuous Infusions: . sodium chloride 10 mL/hr (08/20/19 0600)  . vancomycin 1,000 mg (08/23/19 1148)   Assessment and plan:   1.  Disseminated MRSA infection/bacteremia with sepsis secondary to iliopsoas/gluteal abscess.  With complications including but not limited to sacral osteomyelitis/myositis/septic arthritis of the right SI joint.  Also possible infection of the right dorsum hand and right sternoclavicular joint. Continue IV vancomycin per ID recommendations for 6 weeks-stop date 4/14..  She is not a candidate for home IV therapy given IVDA.  She is currently on day 19 of IV vancomycin (started on 3/3).  Appreciate ID, IR and orthopedics (Dr. Judee Clara) input.  Patient is s/p right psoas drain on 3.12.21 - IR recommended to continue with flushing TID, output recording q shift and dressing changes as  needed.  IR planning on repeat CT imaging as output felt to be less than 10 ml for 24 hours , possibly can remove drain soon.  Simethicone as needed for bloating sensation ordered  2. Right chest empyema, thoracic surgery was initially consulted, thoracic surgery recommended IR consult due to lack of accessible fluid collection. IR evaluated patient, s/p ultrasound guided thoracentesis with removal of 13 cc of purulent fluid on March 10, collection was too small the time no chest tube placement recommended.ID and thoracic surgery following-continue medical management with IV vancomycin.  3. Peritonitis/IUD perforation:CT scan of the abdomen her abdominal exam is benign. Gynecology and general surgery was consulted and they have signed off.They recommended no surgical intervention at this time.  4. Bipolar disorder/anxiety/depression:Home medications Cymbalta, Seroquel trazodone were held initially but now resumed.  5. Hypokalemia.  Normalized now.  Replace as needed, mag level adequate at  2.0  6. Thrombocytosis: Likely reactive, improving  7. Normocytic anemia, in the setting of acute illness.  Hemoglobin has been stable about 9 the last few days.  Monitor.    8.  Polysubstance abuse: Patient has h/o IVDU and smoking.Previously documented narcotic seeking behavior, please refer notes on March 16 by Dr. Aileen Fass.  She expressed interest in starting Suboxone when her pain gets better in the next few days, continue current pain regimen for now.  9. Moderate Protein calorie malnutrition: Albumin level at 2.2. Ensure Enlive po TID, each supplement provides 350 kcal and 20 grams of protein per RD  DVT prophylaxis: Lovenox CODE STATUS: Full code Communication: Discussed with patient/mother at bedside in detail and all questions answered to satisfaction. Consultants : ID, IR, orthopedics, CT surgery, PCCM. Disposition: Patient is from home prior to admission.  She is receiving inpatient IV antibiotics as not a candidate for home IV therapy until 09/13/2019.  She will be discharged home after completion of IV antibiotics, however expresses plan to move to [place] to live with her dad eventually.   LOS: 20 days    Time spent: 15 minutes    Guilford Shi, MD [facility] Pager in [place]  If 7PM-7AM, please contact night-coverage [email or web address] 08/23/2019, 5:15 PM

## 2019-08-24 LAB — VANCOMYCIN, TROUGH: Vancomycin Tr: 13 ug/mL — ABNORMAL LOW (ref 15–20)

## 2019-08-24 LAB — VANCOMYCIN, PEAK: Vancomycin Pk: 38 ug/mL (ref 30–40)

## 2019-08-24 MED ORDER — VANCOMYCIN HCL 750 MG/150ML IV SOLN
750.0000 mg | Freq: Three times a day (TID) | INTRAVENOUS | Status: DC
  Administered 2019-08-24 – 2019-09-03 (×30): 750 mg via INTRAVENOUS
  Filled 2019-08-24 (×31): qty 150

## 2019-08-24 NOTE — Progress Notes (Signed)
Pharmacy Antibiotic Note  Leslie Molina is a 34 y.o. female admitted on 08/03/2019 with MRSA bacteremia/abscess with osteo of sacrum and ischium. Pharmacy has been consulted for vancomycin for disseminated MRSA infection dosing. Patient followed by ID with plans for 6 total weeks of antibiotics (end date 09/13/19).   Remains afebrile, WBC stable, Scr 0.66. Patient's AUC supratherapeutic (goal 400-550) at 633, warranting dose adjustment.   Vancomycin peak = 38 Vancomycin trough = 13 AUC = 633  Plan: Decrease vancomycin to 750 mg IV q8h --Estimated AUC 474  Monitor clinical progress, renal function, and vancomycin levels as clinically indicated.   Height: 5\' 1"  (154.9 cm) Weight: 148 lb (67.1 kg) IBW/kg (Calculated) : 47.8  Temp (24hrs), Avg:98.3 F (36.8 C), Min:98.2 F (36.8 C), Max:98.4 F (36.9 C)  Recent Labs  Lab 08/18/19 0620 08/18/19 1130 08/19/19 0500 08/20/19 0500 08/21/19 0458 08/23/19 0425 08/24/19 0605 08/24/19 1130  WBC 5.0  --   --   --  6.1  --   --   --   CREATININE 0.54  --  0.62 0.66 0.84 0.66  --   --   VANCOTROUGH  --  11*  --   --   --   --   --  13*  VANCOPEAK 34  --   --   --   --   --  38  --     Estimated Creatinine Clearance: 86.8 mL/min (by C-G formula based on SCr of 0.66 mg/dL).    No Known Allergies  Antimicrobials this admission: 3/3 metronidazole x 1 3/3 vancomycin>>3/11 3/3 cefepime>>3/5 3/4 clindamycin >>3/5 3/11 Ceftaroline>>3/15 3/4 daptomycin 500mg  x 1; resume 3/11 >> 3/15  3/15 vancomycin >>  Microbiology results: 3/4 repeats: staph aureus 3/3BCx:GPC clusters 4/4 bottles (BCID = MRSA) 3/3 iliopsoas abscess: MRSA  3/6 blood x2- neg 3/10 pleural fluid- staph aureus 3/12 wound culture - staph aureus   Thank you for allowing pharmacy to be a part of this patient's care.  Claudina Lick, PharmD Candidate  08/24/2019 3:13 PM

## 2019-08-24 NOTE — Progress Notes (Signed)
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 The abscess extends to the posterior aspect of the proximal right thigh.  Patient initially accepted to ICU on 3/4 from [facility] and concern for septic shock and for surgical/GYN input.  During the ICU course her blood cultures were positive for MRSA, pelvic abscess culture also showed abundant staph aureus on 3/3.  She received 1 dose of daptomycin/clindamycin/cefepime on 3/3, subsequently managed with IV vancomycin only from 3/3 to now. CT of the neck on 3/7/2021showed multiple peripheral predominant nodules at the lung apices suspicious for septic emboli and a right empyema with inflammation and stranding on the right retroclavicular region and right lower neck consistent with soft tissue infection. She was stabilized and transferred to hospitalist service on 3/8.IR was consulted who performe ultrasound-guided thoracentesis on3/03/2020, aspirated 14 mL of purulent material. Culture grew MRSA.CT of the abdomen and pelvis 3/11/2021showed Continued significant enlargement of right ileo psoas muscle with multiple irregular and peripherally enhancing low densities throughout its course, most consistent with abscesses.IR was reconsultedto perform gluteal abscess and right iliopsoas abscess needle aspiration on 08/11/2019.  She now has a right-sided psoas drain in place.  Subjective:  Patient reports improvement in bloating. Seen by IR and underwent repeat CT abd/pelvis yesterday  Objective: Vitals:   08/23/19 0847 08/23/19 1442 08/23/19 2104  08/24/19 0559  BP: 97/62 111/64 112/68 94/67  Pulse:   (!) 110 (!) 103  Resp:   18 20  Temp:  98.5 F (36.9 C) 98.2 F (36.8 C) 98.2 F (36.8 C)  TempSrc:  Oral Oral Oral  SpO2:      Weight:    67.1 kg  Height:        Intake/Output Summary (Last 24 hours) at 08/24/2019 0842 Last data filed at 08/24/2019 0600 Gross per 24 hour  Intake 10 ml  Output 2953 ml  Net -2943 ml   Filed Weights   08/22/19 0603 08/23/19 0614 08/24/19 0559  Weight: 65.4 kg 66.3 kg 67.1 kg    Physical Examination:  General exam: Appears calm and comfortable  Respiratory system: Clear to auscultation. Respiratory effort normal. Cardiovascular system: S1 & S2 heard, RRR. No JVD, murmurs, rubs, gallops or clicks. No pedal edema. Gastrointestinal system: Abdomen is mildly distended, tympanic, soft and nontender. Normal bowel sounds heard. Central nervous system: Alert and oriented. No new focal neurological deficits. Extremities: PICC line in left arm,R hand normal appearing with normal ROM.  R clavicle without any residual swelling/erythema R hip dressed with drain in place - yellow purulent drainage in bulb suction. Psychiatry: Judgement and insight appear normal. Mood & affect appropriate.   Data Reviewed: I have personally reviewed following labs and imaging studies  CBC: Recent Labs  Lab 08/18/19 0620 08/21/19 0458  WBC 5.0 6.1  NEUTROABS 2.9 4.1  HGB 9.2* 9.5*  HCT 28.6* 30.2*  MCV 85.1 86.0  PLT 443* [id number]   Basic Metabolic Panel: Recent Labs  Lab 08/18/19 0620 08/19/19 0500 08/20/19 0500 08/21/19 0458 08/23/19 0425  NA 137 135 136 138 136  K 4.3 3.5 4.0 3.9 3.9  CL 98 98 100 100 97*  CO2 30 27 26 27 27   GLUCOSE 106* 135* 111* 115* 123*  BUN 8 8 9 10 8   CREATININE 0.54 0.62 0.66 0.84 0.66  CALCIUM 8.8* 8.7* 8.9 8.9 8.8*  MG 2.0  --  2.0  --   --    GFR: Estimated Creatinine Clearance: 86.8 mL/min (by C-G formula based on SCr of 0.66 mg/dL). Liver Function Tests: Recent Labs    Lab 08/18/19 0620  AST 21  ALT 45*  ALKPHOS 113  BILITOT 0.4  PROT 7.0  ALBUMIN 2.2*   No results for input(s): LIPASE, AMYLASE in the last 168 hours. No results for input(s): AMMONIA in the last 168 hours. Coagulation Profile: No results for input(s): INR, PROTIME in the last 168 hours. Cardiac Enzymes: No results for input(s): CKTOTAL, CKMB, CKMBINDEX, TROPONINI in the last 168 hours. BNP (last 3 results) No results for input(s): PROBNP in the last 8760 hours. HbA1C: No results for input(s): HGBA1C in the last 72 hours. CBG: No results for input(s): GLUCAP in the last 168 hours. Lipid Profile: No results for input(s): CHOL, HDL, LDLCALC, TRIG, CHOLHDL, LDLDIRECT in the last 72 hours. Thyroid Function Tests: No results for input(s): TSH, T4TOTAL, FREET4, T3FREE, THYROIDAB in the last 72 hours. Anemia Panel: No results for input(s): VITAMINB12, FOLATE, FERRITIN, TIBC, IRON, RETICCTPCT in the last 72 hours. Sepsis Labs: No results for input(s): PROCALCITON, LATICACIDVEN in the last 168 hours.  Recent Results (from the past 240 hour(s))  Culture, Urine     Status: None   Collection Time: 08/17/19  1:27 PM   Specimen: Urine, Clean Catch  Result Value Ref Range Status   Specimen Description URINE, CLEAN CATCH  Final   Special Requests NONE  Final   Culture   Final    NO GROWTH Performed at [facility], [address] [place], [place] [address]    Report Status 08/18/2019 FINAL  Final      Radiology Studies: CT ABDOMEN PELVIS W CONTRAST  Result Date: 08/23/2019 CLINICAL DATA:  Psoas muscle abscess, post drain placement 08/11/2019 EXAM: CT ABDOMEN AND PELVIS WITH CONTRAST TECHNIQUE: Multidetector CT imaging of the abdomen and pelvis was performed using the standard protocol following bolus administration of intravenous contrast. CONTRAST:  183mL OMNIPAQUE IOHEXOL 300 MG/ML  SOLN COMPARISON:  08/10/2019 FINDINGS: Lower chest: Platelike scarring or atelectasis at the  right lung base, slightly improved. No pleural or pericardial effusion. Hepatobiliary: No focal liver abnormality is seen. No gallstones, gallbladder wall thickening, or biliary dilatation. Pancreas: Unremarkable. No pancreatic ductal dilatation or surrounding inflammatory changes. Spleen: Normal in size without focal abnormality. Adrenals/Urinary Tract: Adrenals unremarkable. Kidneys enhance normally without hydronephrosis. Urinary bladder incompletely distended. Stomach/Bowel: Stomach is partially distended by ingested material. Small bowel is nondilated. Moderate colonic fecal material without dilatation or other acute finding. Vascular/Lymphatic: No significant vascular findings are present. No enlarged abdominal or pelvic lymph nodes. Reproductive: IUD in place, right limb extending beyond the margin of the uterus. No adnexal mass. Other: No ascites.  No free air. Stable right retroperitoneal pigtail drain catheter. Significant interval improvement in multiloculated right iliopsoas and gluteal fluid collections. 1.7 cm small intramuscular residual pocket in the inferior right psoas, previously 3 cm. 6.7 cm linear residual collection in the deep gluteal musculature. Decrease in overall thickening of the right iliacus musculature. Musculoskeletal: No fracture or worrisome bone lesion. No CT suggestion of discitis. IMPRESSION: 1. Significant improvement in right iliopsoas and gluteal multiloculated abscess  post percutaneous drainage. Scattered small residual collections, too small for drain catheter. Electronically Signed   By: Lucrezia Europe M.D.   On: 08/23/2019 17:17        Scheduled Meds: . Chlorhexidine Gluconate Cloth  6 each Topical Daily  . docusate sodium  100 mg Oral BID  . DULoxetine  30 mg Oral Daily  . enoxaparin (LOVENOX) injection  40 mg Subcutaneous QHS  . feeding supplement (ENSURE ENLIVE)  237 mL Oral TID BM  . gabapentin  300 mg Oral TID  . nicotine  14 mg Transdermal Daily  .  polyethylene glycol  17 g Oral Daily  . QUEtiapine  150 mg Oral QHS  . sodium chloride flush  10-40 mL Intracatheter Q12H  . traZODone  150 mg Oral QHS   Continuous Infusions: . sodium chloride 10 mL/hr (08/20/19 0600)  . vancomycin 1,000 mg (08/24/19 0356)   Assessment and plan:   1.  Disseminated MRSA infection/bacteremia with sepsis secondary to iliopsoas/gluteal abscess.  With complications including but not limited to sacral osteomyelitis/myositis/septic arthritis of the right SI joint.  Also possible infection of the right dorsum hand and right sternoclavicular joint. Continue IV vancomycin per ID recommendations for 6 weeks-stop date 4/14..  She is not a candidate for home IV therapy given IVDA.  She is currently on day 19 of IV vancomycin (started on 3/3).  Appreciate ID, IR and orthopedics (Dr. Judee Clara) input.  Patient is s/p right psoas drain on 3.12.21 - IR recommended to continue with flushing TID, output recording q shift and dressing changes as needed. Repeat CT imaging 3/24 shows significant improvement in right iliopsoas and gluteal multiloculated abscess post percutaneous drainage. Scattered small residual collections, too small for drain catheter reported. Possibly can remove drain today-IR to f/u.   2. Right chest empyema, thoracic surgery was initially consulted, thoracic surgery recommended IR consult due to lack of accessible fluid collection. IR evaluated patient, s/p ultrasound guided thoracentesis with removal of 13 cc of purulent fluid on March 10, collection was too small the time no chest tube placement recommended.ID following-continue medical management with IV vancomycin.  3. Peritonitis/IUD perforation:CT scan of the abdomen her abdominal exam is benign. Gynecology and general surgery was consulted and they have signed off.They recommended no surgical intervention at this time.  4. Bipolar disorder/anxiety/depression:Home medications Cymbalta, Seroquel trazodone  were held initially but now resumed.  5. Hypokalemia.  Normalized now.  Replace as needed, mag level adequate at 2.0  6. Thrombocytosis: Likely reactive, improving  7. Normocytic anemia, in the setting of acute illness.  Hemoglobin has been stable about 9 the last few days.  Monitor.    8.  Polysubstance abuse: Patient has h/o IVDU and smoking.Previously documented narcotic seeking behavior, please refer notes on March 16 by Dr. Aileen Fass.  She expressed interest in starting Suboxone when her pain gets better in the next few days, continue current pain regimen for now.  9. Moderate Protein calorie malnutrition: Albumin level at 2.2. Ensure Enlive po TID, each supplement provides 350 kcal and 20 grams of protein per RD  DVT prophylaxis: Lovenox CODE STATUS: Full code Communication: Discussed with patient/mother at bedside in detail and all questions answered to satisfaction. Consultants : ID, IR, orthopedics, CT surgery, PCCM. Disposition: Patient is from home prior to admission.  She is receiving inpatient IV antibiotics as not a candidate for home IV therapy until 09/13/2019.  She will be discharged home after completion of IV antibiotics, however expresses plan to move  to [place] to live with her dad eventually.   LOS: 21 days    Time spent: 15 minutes    Guilford Shi, MD [facility] Pager in [place]  If 7PM-7AM, please contact night-coverage [email or web address] 08/24/2019, 8:42 AM

## 2019-08-25 LAB — BASIC METABOLIC PANEL
Anion gap: 10 (ref 5–15)
BUN: 10 mg/dL (ref 6–20)
CO2: 29 mmol/L (ref 22–32)
Calcium: 8.9 mg/dL (ref 8.9–10.3)
Chloride: 100 mmol/L (ref 98–111)
Creatinine, Ser: 0.57 mg/dL (ref 0.44–1.00)
GFR calc Af Amer: >60 mL/min (ref >60)
GFR calc non Af Amer: >60 mL/min (ref >60)
Glucose, Bld: 99 mg/dL (ref 70–99)
Potassium: 4.5 mmol/L (ref 3.5–5.1)
Sodium: 139 mmol/L (ref 135–145)

## 2019-08-25 NOTE — Progress Notes (Signed)
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 The abscess extends to the posterior aspect of the proximal right thigh.  Patient initially accepted to ICU on 3/4 from [facility] and concern for septic shock and for surgical/GYN input.  During the ICU course her blood cultures were positive for MRSA, pelvic abscess culture also showed abundant staph aureus on 3/3.  She received 1 dose of daptomycin/clindamycin/cefepime on 3/3, subsequently managed with IV vancomycin only from 3/3 to now. CT of the neck on 3/7/2021showed multiple peripheral predominant nodules at the lung apices suspicious for septic emboli and a right empyema with inflammation and stranding on the right retroclavicular region and right lower neck consistent with soft tissue infection. She was stabilized and transferred to hospitalist service on 3/8.IR was consulted who performe ultrasound-guided thoracentesis on3/03/2020, aspirated 14 mL of purulent material. Culture grew MRSA.CT of the abdomen and pelvis 3/11/2021showed Continued significant enlargement of right ileo psoas muscle with multiple irregular and peripherally enhancing low densities throughout its course, most consistent with abscesses.IR was reconsultedto perform gluteal abscess and right iliopsoas abscess needle aspiration on 08/11/2019.  She now has a right-sided psoas drain in place.  Subjective:  Patient reports improvement in bloating. Having daily BMs. No nausea. Eating okay.  Seen by IR and psoas drain removed today  Objective: Vitals:   08/24/19 2040  08/25/19 0642 08/25/19 0829 08/25/19 1131  BP: 95/63 (!) 94/56 (!) 90/51 99/62  Pulse: (!) 109 (!) 105 (!) 112 (!) 119  Resp: 18 18    Temp: 98.2 F (36.8 C) 99.3 F (37.4 C) 99.1 F (37.3 C) 99.7 F (37.6 C)  TempSrc: Oral Oral Oral Oral  SpO2:  95% 96% 97%  Weight:  67.2 kg    Height:        Intake/Output Summary (Last 24 hours) at 08/25/2019 1552 Last data filed at 08/25/2019 1000 Gross per 24 hour  Intake 667.16 ml  Output 1 ml  Net 666.16 ml   Filed Weights   08/23/19 0614 08/24/19 0559 08/25/19 0642  Weight: 66.3 kg 67.1 kg 67.2 kg    Physical Examination:  General exam: Appears calm and comfortable  Respiratory system: Clear to auscultation. Respiratory effort normal. Cardiovascular system: S1 & S2 heard, RRR. No JVD, murmurs, rubs, gallops or clicks. No pedal edema. Gastrointestinal system: Abdomen is mildly distended, tympanic, soft and nontender. Normal bowel sounds heard. S/p drain removal Central nervous system: Alert and oriented. No new focal neurological deficits. Extremities: PICC line in left arm,R hand normal appearing with normal ROM.  Psychiatry: Judgement and insight appear normal. Mood & affect appropriate.   Data Reviewed: I have personally reviewed following labs and imaging studies  CBC: Recent Labs  Lab 08/21/19 0458  WBC 6.1  NEUTROABS 4.1  HGB 9.5*  HCT 30.2*  MCV 86.0  PLT 562   Basic Metabolic Panel: Recent Labs  Lab 08/19/19 0500 08/20/19 0500 08/21/19 0458 08/23/19 0425 08/25/19 0612  NA 135 136 138 136 139  K 3.5 4.0 3.9 3.9 4.5  CL 98 100 100 97* 100  CO2 27 26  27 27 29   GLUCOSE 135* 111* 115* 123* 99  BUN 8 9 10 8 10   CREATININE 0.62 0.66 0.84 0.66 0.57  CALCIUM 8.7* 8.9 8.9 8.8* 8.9  MG  --  2.0  --   --   --    GFR: Estimated Creatinine Clearance: 87 mL/min (by C-G formula based on SCr of 0.57 mg/dL). Liver Function Tests: No results for input(s): AST, ALT, ALKPHOS, BILITOT, PROT, ALBUMIN in the last 168  hours. No results for input(s): LIPASE, AMYLASE in the last 168 hours. No results for input(s): AMMONIA in the last 168 hours. Coagulation Profile: No results for input(s): INR, PROTIME in the last 168 hours. Cardiac Enzymes: No results for input(s): CKTOTAL, CKMB, CKMBINDEX, TROPONINI in the last 168 hours. BNP (last 3 results) No results for input(s): PROBNP in the last 8760 hours. HbA1C: No results for input(s): HGBA1C in the last 72 hours. CBG: No results for input(s): GLUCAP in the last 168 hours. Lipid Profile: No results for input(s): CHOL, HDL, LDLCALC, TRIG, CHOLHDL, LDLDIRECT in the last 72 hours. Thyroid Function Tests: No results for input(s): TSH, T4TOTAL, FREET4, T3FREE, THYROIDAB in the last 72 hours. Anemia Panel: No results for input(s): VITAMINB12, FOLATE, FERRITIN, TIBC, IRON, RETICCTPCT in the last 72 hours. Sepsis Labs: No results for input(s): PROCALCITON, LATICACIDVEN in the last 168 hours.  Recent Results (from the past 240 hour(s))  Culture, Urine     Status: None   Collection Time: 08/17/19  1:27 PM   Specimen: Urine, Clean Catch  Result Value Ref Range Status   Specimen Description URINE, CLEAN CATCH  Final   Special Requests NONE  Final   Culture   Final    NO GROWTH Performed at [facility], [address] [place], [place] [address]    Report Status 08/18/2019 FINAL  Final      Radiology Studies: CT ABDOMEN PELVIS W CONTRAST  Result Date: 08/23/2019 CLINICAL DATA:  Psoas muscle abscess, post drain placement 08/11/2019 EXAM: CT ABDOMEN AND PELVIS WITH CONTRAST TECHNIQUE: Multidetector CT imaging of the abdomen and pelvis was performed using the standard protocol following bolus administration of intravenous contrast. CONTRAST:  156m OMNIPAQUE IOHEXOL 300 MG/ML  SOLN COMPARISON:  08/10/2019 FINDINGS: Lower chest: Platelike scarring or atelectasis at the right lung base, slightly improved. No pleural or pericardial effusion. Hepatobiliary: No  focal liver abnormality is seen. No gallstones, gallbladder wall thickening, or biliary dilatation. Pancreas: Unremarkable. No pancreatic ductal dilatation or surrounding inflammatory changes. Spleen: Normal in size without focal abnormality. Adrenals/Urinary Tract: Adrenals unremarkable. Kidneys enhance normally without hydronephrosis. Urinary bladder incompletely distended. Stomach/Bowel: Stomach is partially distended by ingested material. Small bowel is nondilated. Moderate colonic fecal material without dilatation or other acute finding. Vascular/Lymphatic: No significant vascular findings are present. No enlarged abdominal or pelvic lymph nodes. Reproductive: IUD in place, right limb extending beyond the margin of the uterus. No adnexal mass. Other: No ascites.  No free air. Stable right retroperitoneal pigtail drain catheter. Significant interval improvement in multiloculated right iliopsoas and gluteal fluid collections. 1.7 cm small intramuscular residual pocket in the inferior right psoas, previously 3 cm. 6.7 cm linear residual collection in the deep gluteal musculature. Decrease in overall thickening of the right iliacus musculature. Musculoskeletal: No fracture or worrisome bone lesion. No CT suggestion of discitis. IMPRESSION: 1. Significant improvement in right iliopsoas and gluteal multiloculated abscess post percutaneous drainage. Scattered small residual collections, too small for drain catheter. Electronically Signed   By: DLucrezia Europe  M.D.   On: 08/23/2019 17:17        Scheduled Meds: . Chlorhexidine Gluconate Cloth  6 each Topical Daily  . docusate sodium  100 mg Oral BID  . DULoxetine  30 mg Oral Daily  . enoxaparin (LOVENOX) injection  40 mg Subcutaneous QHS  . feeding supplement (ENSURE ENLIVE)  237 mL Oral TID BM  . gabapentin  300 mg Oral TID  . nicotine  14 mg Transdermal Daily  . polyethylene glycol  17 g Oral Daily  . QUEtiapine  150 mg Oral QHS  . sodium chloride flush   10-40 mL Intracatheter Q12H  . traZODone  150 mg Oral QHS   Continuous Infusions: . sodium chloride 10 mL/hr (08/20/19 0600)  . vancomycin 750 mg (08/25/19 1246)   Assessment and plan:  1.  Disseminated MRSA infection/bacteremia with sepsis secondary to iliopsoas/gluteal abscess.  With complications including but not limited to sacral osteomyelitis/myositis/septic arthritis of the right SI joint.  Also possible infection of the right dorsum hand and right sternoclavicular joint. Continue IV vancomycin per ID recommendations for 6 weeks-started 3/3-stop date 4/14 followed by PO doxycycline x 14 days.  CRP / ESR to repeat prior to D/C (orders entered by ID) Continue weekly CBC and twice weekly BMP. She is not a candidate for home IV therapy given IVDA.   Appreciate ID, IR and orthopedics (Dr. Judee Clara) input.    2. Right chest empyema, thoracic surgery was initially consulted, they recommended IR consult due to lack of accessible fluid collection. IR evaluated patient, s/p ultrasound guided thoracentesis with removal of 13 cc of purulent fluid on March 10, collection was too small the time no chest tube placement recommended.ID been following-continue medical management with IV vancomycin.  3. Peritonitis/IUD perforation:CT scan of the abdomen her abdominal exam is benign. Gynecology and general surgery was consulted and they have signed off.They recommended no surgical intervention at this time.  4. Bipolar disorder/anxiety/depression:Home medications Cymbalta, Seroquel trazodone were held initially but now resumed.  5. Hypokalemia.  Normalized now.  Replace as needed, mag level adequate at 2.0  6. Thrombocytosis: Likely reactive, improving  7. Normocytic anemia, in the setting of acute illness.  Hemoglobin has been stable about 9 the last few days.  Monitor.    8.  Polysubstance abuse: Patient has h/o IVDU and smoking.Previously documented narcotic seeking behavior, please refer notes on  March 16 by Dr. Aileen Fass.  She expressed interest in starting Suboxone when her pain gets better in the next few days, continue current pain regimen for now.  9. Moderate Protein calorie malnutrition: Albumin level at 2.2. Ensure Enlive po TID, each supplement provides 350 kcal and 20 grams of protein per RD  DVT prophylaxis: Lovenox CODE STATUS: Full code Communication: Discussed with patient/mother at bedside in detail and all questions answered to satisfaction. Consultants : ID, IR, orthopedics, CT surgery, PCCM. Disposition: Patient is from home prior to admission.  She is receiving inpatient IV antibiotics as not a candidate for home IV therapy until 09/13/2019.  She will be discharged home after completion of IV antibiotics, however expresses plan to move to [place] to live with her dad eventually.   LOS: 22 days    Time spent: 15 minutes    Guilford Shi, MD [facility] Pager in [place]  If 7PM-7AM, please contact night-coverage [email or web address] 08/25/2019, 3:52 PM

## 2019-08-25 NOTE — Progress Notes (Signed)
[facility]  Date of Admission:  08/03/2019      Total days of antibiotics 23  Day 23 Vancomycin            ASSESSMENT: Leslie Molina is a 34 y.o. female with disseminated MRSA infection including R dorsal hand (resolved on antibiotics alone), Right thigh/hip/sacrum with associated septic arthritis/osteomyelitis (s/p perc drain that was removed 3/26), R empyema (s/p thoracentesis).   She is on day 23 of treatment with Vancomycin for her disseminated infection. Will continue through April 14th as scheduled. Some small non-drainable abscess remaining in the left hip that antibiotics alone will continue to treat. She may need oral stepdown therapy with Doxycycline at discharge for a period of time given extensive osteomyelitis of the right hip --> would plan on discharging with 2 weeks of Doxycycline PO. Will arrange follow up in ID clinic 2 weeks after discharge.   Sed Rate (mm/hr)  Date Value  08/22/2019 105 (H)  08/05/2019 84 (H)  08/02/2019 101 (H)   CRP (mg/dL)  Date Value  08/22/2019 5.9 (H)  08/05/2019 25.6 (H)  08/02/2019 39.0 (H)   Creatinine stable on current dose of Vancomycin. She is tolerating well without any side effects.   HIV, Hep B and Hep C all non-reactive this admission.     PLAN: 1. Continue Vancomycin through April 14th  2. CRP / ESR to repeat prior to D/C (orders entered) 3. Plan for doxycycline po x 14 days at D/C  4. Continue weekly CBC and twice weekly BMP.  5. Vanc AUC dosing as directed by pharmacy.   Follow up in ID clinic arranged for Tuesday May 4th @ 9:30 am with Janene Madeira, NP  Will sign off for now - please call back with any changes in condition or questions related to discharge plan.    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 Chlorhexidine Gluconate Cloth  6 each Topical Daily  . docusate sodium  100 mg Oral BID  . DULoxetine  30 mg Oral Daily  . enoxaparin (LOVENOX) injection  40 mg Subcutaneous QHS  . feeding supplement (ENSURE ENLIVE)  237 mL Oral TID BM  . gabapentin  300 mg Oral TID  . nicotine  14 mg Transdermal Daily  . polyethylene glycol  17 g Oral Daily  . QUEtiapine  150 mg Oral QHS  . sodium chloride flush  10-40 mL Intracatheter Q12H  . traZODone  150 mg Oral QHS    SUBJECTIVE: Aveya feels like her condition is improving slowly. Drain was removed from her right hip. Still having aches and pains with it but trying to move it around more than before. Concerned her right clavicle feels "pressured" and worried about re-accumulation in chest. No SOB, cough or increased pain with breathing like before.   Afebrile, creatinine is stable.    Review of Systems: Review of Systems  Constitutional: Negative for chills and fever.  Respiratory: Negative for cough and shortness of breath.   Cardiovascular: Negative for chest pain.  Gastrointestinal: Negative for abdominal pain and vomiting.  Musculoskeletal: Positive for joint pain (R hip).  Neurological: Negative for dizziness and headaches.  No Known Allergies  OBJECTIVE: Vitals:   08/24/19 1428 08/24/19 2040 08/25/19 0642 08/25/19 0829  BP: 98/64 95/63 (!) 94/56 (!) 90/51  Pulse: 99 (!) 109 (!) 105 (!) 112  Resp:  18 18   Temp:  98.2 F (36.8 C) 99.3 F (37.4 C) 99.1 F (37.3 C)  TempSrc:  Oral Oral Oral  SpO2:   95% 96%  Weight:   67.2 kg   Height:       Body mass index is 27.99 kg/m.  Physical Exam Cardiovascular:     Rate and Rhythm: Normal rate and regular rhythm.     Heart sounds: No murmur.  Pulmonary:     Effort: Pulmonary effort is normal.     Breath sounds: Normal breath sounds.  Abdominal:     General: Abdomen is flat. Bowel sounds are normal. There is  no distension.  Musculoskeletal:     Comments: R hand normal appearing with normal ROM.  R clavicle without any swelling or erythema - no pain with palpation overlying site.  R hip dressing clean and dry - drain removed.   Skin:    General: Skin is warm and dry.  Neurological:     Mental Status: She is oriented to person, place, and time.  Psychiatric:        Mood and Affect: Mood normal.   PIC line in LUA unremarkable.    Lab Results Lab Results  Component Value Date   WBC 6.1 08/21/2019   HGB 9.5 (L) 08/21/2019   HCT 30.2 (L) 08/21/2019   MCV 86.0 08/21/2019   PLT 386 08/21/2019    Lab Results  Component Value Date   CREATININE 0.57 08/25/2019   BUN 10 08/25/2019   NA 139 08/25/2019   K 4.5 08/25/2019   CL 100 08/25/2019   CO2 29 08/25/2019    Lab Results  Component Value Date   ALT 45 (H) 08/18/2019   AST 21 08/18/2019   ALKPHOS 113 08/18/2019   BILITOT 0.4 08/18/2019     Microbiology: Recent Results (from the past 240 hour(s))  Culture, Urine     Status: None   Collection Time: 08/17/19  1:27 PM   Specimen: Urine, Clean Catch  Result Value Ref Range Status   Specimen Description URINE, CLEAN CATCH  Final   Special Requests NONE  Final   Culture   Final    NO GROWTH Performed at [facility], [address] [place], [place] [address]    Report Status 08/18/2019 FINAL  Final     Janene Madeira, MSN, NP-C [facility] [email or web address] Pager: [phone] Office: [phone] [place]: [phone]

## 2019-08-25 NOTE — Progress Notes (Signed)
Referring Physician(s): Dr. Dillard Cannon  Supervising Physician: Aletta Edouard  Patient Status:  [facility] - In-pt  Chief Complaint:  Psoas abscess  Brief History:  Leslie Molina is a 34 y.o.female withpast medical history of bipolar disorder, anxiety, and IV drug abuse.  Leslie Molina presented to the ED twice within a week with lower back pain.  Leslie Molina was found to have a temp of 102.   MRI showedextensive right iliopsoas abscesswith extensive myositis and osteomyelitis of the right sacrum and iliac bone.   Leslie Molina underwent Successful CT-guided right psoas abscess drain placement and CT-guided right gluteal abscess aspiration by Dr. Annamaria Boots on 08/11/19.  Blood cultures were positive for MRSA.  Abscess culture also showed MRSA  Subjective:  Eating breakfast. No complaints.  Allergies: Patient has no known allergies.  Medications: Prior to Admission medications   Medication Sig Start Date End Date Taking? Authorizing Provider  acetaminophen (TYLENOL) 325 MG tablet Take 2 tablets (650 mg total) by mouth every 6 (six) hours as needed for fever. 08/03/19  Yes Nolberto Hanlon, MD  ibuprofen (ADVIL) 200 MG tablet Take 200 mg by mouth every 6 (six) hours as needed for moderate pain.   Yes [provider]  ceFEPIme 2 g in sodium chloride 0.9 % 100 mL Inject 2 g into the vein every 8 (eight) hours. Patient not taking: Reported on 08/03/2019 08/03/19   Nolberto Hanlon, MD  Chlorhexidine Gluconate Cloth 2 % PADS Apply 6 each topically daily. Patient not taking: Reported on 08/03/2019 08/03/19   Nolberto Hanlon, MD  clindamycin (CLEOCIN) 900 MG/50ML IVPB Inject 50 mLs (900 mg total) into the vein every 8 (eight) hours. Patient not taking: Reported on 08/03/2019 08/03/19   Nolberto Hanlon, MD  Dextrose-Sodium Chloride (DEXTROSE 5 % AND 0.45% NACL) infusion Inject 150 mL/hr into the vein continuous. Patient not taking: Reported on 08/03/2019 08/03/19   Nolberto Hanlon, MD  enoxaparin (LOVENOX) 40 MG/0.4ML injection Inject  0.4 mLs (40 mg total) into the skin daily. Patient not taking: Reported on 08/03/2019 08/03/19   Nolberto Hanlon, MD  norepinephrine (LEVOPHED) 4-5 MG/250ML-% SOLN Inject 0-40 mcg/min into the vein continuous. Patient not taking: Reported on 08/03/2019 08/03/19   Nolberto Hanlon, MD  oxyCODONE (OXY IR/ROXICODONE) 5 MG immediate release tablet Take 1 tablet (5 mg total) by mouth every 6 (six) hours as needed for breakthrough pain. Patient not taking: Reported on 08/03/2019 08/03/19   Nolberto Hanlon, MD  potassium chloride 10 MEQ/100ML Inject 100 mLs (10 mEq total) into the vein every 1 hour x 6 doses. Patient not taking: Reported on 08/03/2019 08/03/19   Nolberto Hanlon, MD  vancomycin (VANCOREADY) 750 MG/150ML SOLN Inject 150 mLs (750 mg total) into the vein every 8 (eight) hours. Patient not taking: Reported on 08/03/2019 08/03/19   Nolberto Hanlon, MD     Vital Signs: BP (!) 90/51   Pulse (!) 112   Temp 99.1 F (37.3 C) (Oral)   Resp 18   Ht 5\' 1"  (1.549 m)   Wt 67.2 kg   LMP  (LMP Unknown)   SpO2 96%   BMI 27.99 kg/m   Physical Exam Vitals reviewed.  Constitutional:      Appearance: Normal appearance.  Cardiovascular:     Rate and Rhythm: Normal rate.  Pulmonary:     Effort: Pulmonary effort is normal. No respiratory distress.  Abdominal:     Palpations: Abdomen is soft.  Musculoskeletal:     Comments: Right buttock drain in place. Removed easily, no complications.  Neurological:     General: No focal deficit present.     Mental Status: Leslie Molina is alert and oriented to person, place, and time.  Psychiatric:        Mood and Affect: Mood normal.        Behavior: Behavior normal.        Thought Content: Thought content normal.        Judgment: Judgment normal.     Imaging: CT ABDOMEN PELVIS W CONTRAST  Result Date: 08/23/2019 CLINICAL DATA:  Psoas muscle abscess, post drain placement 08/11/2019 EXAM: CT ABDOMEN AND PELVIS WITH CONTRAST TECHNIQUE: Multidetector CT imaging of the abdomen and pelvis was  performed using the standard protocol following bolus administration of intravenous contrast. CONTRAST:  154mL OMNIPAQUE IOHEXOL 300 MG/ML  SOLN COMPARISON:  08/10/2019 FINDINGS: Lower chest: Platelike scarring or atelectasis at the right lung base, slightly improved. No pleural or pericardial effusion. Hepatobiliary: No focal liver abnormality is seen. No gallstones, gallbladder wall thickening, or biliary dilatation. Pancreas: Unremarkable. No pancreatic ductal dilatation or surrounding inflammatory changes. Spleen: Normal in size without focal abnormality. Adrenals/Urinary Tract: Adrenals unremarkable. Kidneys enhance normally without hydronephrosis. Urinary bladder incompletely distended. Stomach/Bowel: Stomach is partially distended by ingested material. Small bowel is nondilated. Moderate colonic fecal material without dilatation or other acute finding. Vascular/Lymphatic: No significant vascular findings are present. No enlarged abdominal or pelvic lymph nodes. Reproductive: IUD in place, right limb extending beyond the margin of the uterus. No adnexal mass. Other: No ascites.  No free air. Stable right retroperitoneal pigtail drain catheter. Significant interval improvement in multiloculated right iliopsoas and gluteal fluid collections. 1.7 cm small intramuscular residual pocket in the inferior right psoas, previously 3 cm. 6.7 cm linear residual collection in the deep gluteal musculature. Decrease in overall thickening of the right iliacus musculature. Musculoskeletal: No fracture or worrisome bone lesion. No CT suggestion of discitis. IMPRESSION: 1. Significant improvement in right iliopsoas and gluteal multiloculated abscess post percutaneous drainage. Scattered small residual collections, too small for drain catheter. Electronically Signed   By: Lucrezia Europe M.D.   On: 08/23/2019 17:17    Labs:  CBC: Recent Labs    08/13/19 0535 08/14/19 0647 08/18/19 0620 08/21/19 0458  WBC 7.7 7.0 5.0 6.1    HGB 9.1* 9.3* 9.2* 9.5*  HCT 29.5* 30.2* 28.6* 30.2*  PLT 621* 647* 443* 386    COAGS: Recent Labs    08/03/19 0250 08/11/19 0427  INR 1.3* 1.3*    BMP: Recent Labs    08/20/19 0500 08/21/19 0458 08/23/19 0425 08/25/19 0612  NA 136 138 136 139  K 4.0 3.9 3.9 4.5  CL 100 100 97* 100  CO2 26 27 27 29   GLUCOSE 111* 115* 123* 99  BUN 9 10 8 10   CALCIUM 8.9 8.9 8.8* 8.9  CREATININE 0.66 0.84 0.66 0.57  GFRNONAA >60 >60 >60 >60  GFRAA >60 >60 >60 >60    LIVER FUNCTION TESTS: Recent Labs    08/12/19 1317 08/13/19 0535 08/14/19 0647 08/18/19 0620  BILITOT 0.2* 0.3 0.4 0.4  AST 25 56* 43* 21  ALT 45* 69* 74* 45*  ALKPHOS 183* 164* 149* 113  PROT 6.9 6.6 7.0 7.0  ALBUMIN 2.1* 2.0* 2.0* 2.2*    Assessment and Plan:  S/P CT-guided right psoas abscess drain placement and right gluteal abscess aspiration by Dr.Shick on 08/11/19.  Output continues to be scant.  CT imaging reviewed by Dr. Kathlene Cote. Collection appears resolved.  Drain removed today without difficulty and sterile  dressing placed.  Recommend remove dressing on Sunday.  Electronically Signed: Murrell Redden, PA-C 08/25/2019, 9:53 AM    I spent a total of 15 Minutes at the the patient's bedside AND on the patient's hospital floor or unit, greater than 50% of which was counseling/coordinating care for drain removal.

## 2019-08-26 NOTE — Progress Notes (Signed)
PROGRESS NOTE  Leslie Molina KNL:[id number] DOB: 10/21/1985 DOA: 08/03/2019 [facility]CP: [facility]  Brief History   34 y.o.femalepast medical history of bipolar disorder anxiety and IV drug abuse presents to the emergency room twice within a week with lower back pain she is found to have a temperature of 102 and right extremity pain an MRI of the right hip showed extensive right iliopsoas abscess with extensive myositis and osteomyelitis of the right sacrum and iliac bone. The abscess extends to the posterior aspect of the proximal right thigh.  Patient initially accepted to ICU on 3/4 from [facility] and concern for septic shock and for surgical/GYN input.  During the ICU course her blood cultures were positive for MRSA, pelvic abscess culture also showed abundant staph aureus on 3/3.  She received 1 dose of daptomycin/clindamycin/cefepime on 3/3, subsequently managed with IV vancomycin only from 3/3 to now. CT of the neck on 3/7/2021showed multiple peripheral predominant nodules at the lung apices suspicious for septic emboli and a right empyema with inflammation and stranding on the right retroclavicular region and right lower neck consistent with soft tissue infection. She was stabilized and transferred to hospitalist service on 3/8.IR was consulted who performe ultrasound-guided thoracentesis on3/03/2020, aspirated 14 mL of purulent material. Culture grew MRSA.CT of the abdomen and pelvis 3/11/2021showed Continued significant enlargement of right ileo psoas muscle with multiple irregular and peripherally enhancing low densities throughout its course, most consistent with abscesses.IR was reconsultedto perform gluteal abscess and right iliopsoas abscess needle aspiration on 08/11/2019.  She now has a right-sided psoas drain in place.  Consultants  . Infectious disease . Interventional Radiology . CTS . Orthopedic surgery . OB/Gyn . General Surgery . PCCM  Procedures  . Drain  placed for gluteal abscess after needle aspiration . IT Thoracentesis . Triple lumen catheter placement . IT drainage of abscess in iliacus .   Antibiotics   Anti-infectives (From admission, onward)   Start     Dose/Rate Route Frequency Ordered Stop   08/24/19 2000  vancomycin (VANCOREADY) IVPB 750 mg/150 mL     750 mg 150 mL/hr over 60 Minutes Intravenous Every 8 hours 08/24/19 1506     08/16/19 2000  vancomycin (VANCOCIN) IVPB 1000 mg/200 mL premix  Status:  Discontinued     1,000 mg 200 mL/hr over 60 Minutes Intravenous Every 8 hours 08/16/19 1601 08/24/19 1506   08/15/19 0400  vancomycin (VANCOREADY) IVPB 1250 mg/250 mL  Status:  Discontinued     1,250 mg 166.7 mL/hr over 90 Minutes Intravenous Every 8 hours 08/14/19 1132 08/16/19 1601   08/14/19 2000  vancomycin (VANCOREADY) IVPB 1500 mg/300 mL     1,500 mg 150 mL/hr over 120 Minutes Intravenous  Once 08/14/19 1132 08/14/19 2217   08/11/19 0900  ceftaroline (TEFLARO) 600 mg in sodium chloride 0.9 % 250 mL IVPB  Status:  Discontinued     600 mg 250 mL/hr over 60 Minutes Intravenous Every 8 hours 08/11/19 0859 08/14/19 1109   08/10/19 2200  linezolid (ZYVOX) IVPB 600 mg  Status:  Discontinued     600 mg 300 mL/hr over 60 Minutes Intravenous Every 12 hours 08/10/19 1732 08/10/19 1732   08/10/19 2200  ceftaroline (TEFLARO) 600 mg in sodium chloride 0.9 % 250 mL IVPB  Status:  Discontinued     600 mg 250 mL/hr over 60 Minutes Intravenous Every 12 hours 08/10/19 1733 08/11/19 0859   08/10/19 1830  DAPTOmycin (CUBICIN) 500 mg in sodium chloride 0.9 % IVPB  Status:  Discontinued     500 mg 220 mL/hr over 30 Minutes Intravenous Daily 08/10/19 1801 08/14/19 1109   08/07/19 1800  vancomycin (VANCOREADY) IVPB 1500 mg/300 mL  Status:  Discontinued     1,500 mg 150 mL/hr over 120 Minutes Intravenous Every 8 hours 08/07/19 1344 08/10/19 1732   08/06/19 2300  vancomycin (VANCOREADY) IVPB 2000 mg/400 mL  Status:  Discontinued     2,000  mg 200 mL/hr over 120 Minutes Intravenous Every 8 hours 08/06/19 2230 08/07/19 1344   08/05/19 1400  vancomycin (VANCOCIN) IVPB 1000 mg/200 mL premix  Status:  Discontinued     1,000 mg 200 mL/hr over 60 Minutes Intravenous Every 8 hours 08/05/19 0815 08/06/19 2229   08/03/19 2200  ceFEPIme (MAXIPIME) 2 g in sodium chloride 0.9 % 100 mL IVPB  Status:  Discontinued     2 g 200 mL/hr over 30 Minutes Intravenous Every 8 hours 08/03/19 1851 08/04/19 1027   08/03/19 2100  vancomycin (VANCOREADY) IVPB 750 mg/150 mL  Status:  Discontinued     750 mg 150 mL/hr over 60 Minutes Intravenous Every 8 hours 08/03/19 1851 08/05/19 0815   08/03/19 2000  clindamycin (CLEOCIN) IVPB 900 mg  Status:  Discontinued     900 mg 100 mL/hr over 30 Minutes Intravenous Every 8 hours 08/03/19 1851 08/04/19 1027    .   Subjective  The patient is resting comfortably. No new complaints.  Objective   Vitals:  Vitals:   08/25/19 2029 08/26/19 0654  BP: (!) 91/59 103/70  Pulse: (!) 112 96  Resp: 18 18  Temp: 99.5 F (37.5 C) 98.8 F (37.1 C)  SpO2: 97% 97%   Exam:  Constitutional:  . The patient is awake, alert, and oriented x 3. No acute distress. Respiratory:  . No increased work of breathing. . No wheezes, rales, or rhonchi . No tactile fremitus Cardiovascular:  . Regular rate and rhythm . No murmurs, ectopy, or gallups. . No lateral PMI. No thrills. Abdomen:  . Abdomen is soft, non-tender, non-distended . No hernias, masses, or organomegaly . Normoactive bowel sounds.  Musculoskeletal:  . No cyanosis, clubbing, or edema Skin:  . No rashes, lesions, ulcers . palpation of skin: no induration or nodules Neurologic:  . CN 2-12 intact . Sensation all 4 extremities intact Psychiatric:  . Mental status o Mood, affect appropriate o Orientation to person, place, time  . judgment and insight appear intact  I have personally reviewed the following:   Today's Data  . Vitals  Scheduled  Meds: . Chlorhexidine Gluconate Cloth  6 each Topical Daily  . docusate sodium  100 mg Oral BID  . DULoxetine  30 mg Oral Daily  . enoxaparin (LOVENOX) injection  40 mg Subcutaneous QHS  . feeding supplement (ENSURE ENLIVE)  237 mL Oral TID BM  . gabapentin  300 mg Oral TID  . nicotine  14 mg Transdermal Daily  . polyethylene glycol  17 g Oral Daily  . QUEtiapine  150 mg Oral QHS  . sodium chloride flush  10-40 mL Intracatheter Q12H  . traZODone  150 mg Oral QHS   Continuous Infusions: . sodium chloride 10 mL/hr (08/20/19 0600)  . vancomycin 750 mg (08/26/19 1227)    Principal Problem:   MRSA bacteremia Active Problems:   Sepsis ([place])   Iliopsoas abscess on right ([place])   Septic shock ([place])   Pain of right clavicle   Right hand pain   Osteomyelitis of sacrum ([place])  Pleural empyema (HCC)   Pneumonia   Empyema ([place])   Abscess   LOS: 23 days   A & P  1.  Disseminated MRSA infection/bacteremia with sepsis secondary to iliopsoas/gluteal abscess.  With complications including but not limited to sacral osteomyelitis/myositis/septic arthritis of the right SI joint.  Also possible infection of the right dorsum hand and right sternoclavicular joint. Continue IV vancomycin per ID recommendations for 6 weeks-started 3/3-stop date 4/14 followed by PO doxycycline x 14 days.  CRP / ESR to repeat prior to D/C (orders entered by ID) Continue weekly CBC and twice weekly BMP. She is not a candidate for home IV therapy given IVDA.   Appreciate ID, IR and orthopedics (Dr. Judee Clara) input.    2. Right chest empyema, thoracic surgery was initially consulted, they recommended IR consult due to lack of accessible fluid collection. IR evaluated patient, s/p ultrasound guided thoracentesis with removal of 13 cc of purulent fluid on March 10, collection was too small the time no chest tube placement recommended.ID been following-continue medical management with IV vancomycin.  3. Peritonitis/IUD  perforation:CT scan of the abdomen her abdominal exam is benign. Gynecology and general surgery was consulted and they have signed off.They recommended no surgical intervention at this time.  4. Bipolar disorder/anxiety/depression:Home medications Cymbalta, Seroquel trazodone were held initially but now resumed.  5. Hypokalemia.  Normalized now.  Replace as needed, mag level adequate at 2.0  6. Thrombocytosis: Likely reactive, improving  7. Normocytic anemia, in the setting of acute illness.  Hemoglobin has been stable about 9 the last few days.  Monitor.    8.  Polysubstance abuse: Patient has h/o IVDU and smoking.Previously documented narcotic seeking behavior, please refer notes on March 16 by Dr. Aileen Fass.  She expressed interest in starting Suboxone when her pain gets better in the next few days, continue current pain regimen for now.  9. Moderate Protein calorie malnutrition: Albumin level at 2.2. Ensure Enlive poTID, each supplement provides 350 kcal and 20 grams of protein per RD  DVT prophylaxis: Lovenox CODE STATUS: Full code Communication: Discussed with patient/mother at bedside in detail and all questions answered to satisfaction. Consultants : ID, IR, orthopedics, CT surgery, PCCM. Disposition: Patient is from home prior to admission.  She is receiving inpatient IV antibiotics as not a candidate for home IV therapy until 09/13/2019.  She will be discharged home after completion of IV antibiotics, however expresses plan to move to [place] to live with her dad eventually.    Leslie Rossetti, DO [facility] Direct contact: see [email or web address]  7PM-7AM contact night coverage as above 08/26/2019, 7:23 PM  LOS: 23 days

## 2019-08-27 LAB — VANCOMYCIN, TROUGH: Vancomycin Tr: 9 ug/mL — ABNORMAL LOW (ref 15–20)

## 2019-08-27 LAB — VANCOMYCIN, PEAK: Vancomycin Pk: 28 ug/mL — ABNORMAL LOW (ref 30–40)

## 2019-08-27 NOTE — Plan of Care (Signed)
  Problem: Health Behavior/Discharge Planning: Goal: Ability to manage health-related needs will improve Outcome: Progressing   Problem: Clinical Measurements: Goal: Ability to maintain clinical measurements within normal limits will improve Outcome: Progressing Goal: Diagnostic test results will improve Outcome: Progressing   Problem: Activity: Goal: Risk for activity intolerance will decrease Outcome: Progressing   Problem: Nutrition: Goal: Adequate nutrition will be maintained Outcome: Progressing   Problem: Coping: Goal: Level of anxiety will decrease Outcome: Progressing   Problem: Elimination: Goal: Will not experience complications related to bowel motility Outcome: Progressing Goal: Will not experience complications related to urinary retention Outcome: Progressing   Problem: Pain Managment: Goal: General experience of comfort will improve Outcome: Progressing   Problem: Skin Integrity: Goal: Risk for impaired skin integrity will decrease Outcome: Progressing   Elesa Hacker, RN

## 2019-08-27 NOTE — Progress Notes (Signed)
PROGRESS NOTE  Leslie Molina ZHY:[id number] DOB: [birth year] DOA: 08/03/2019 PCP: [facility]  Brief History   34 y.o.femalepast medical history of bipolar disorder anxiety and IV drug abuse presents to the emergency room twice within a week with lower back pain she is found to have a temperature of 102 and right extremity pain an MRI of the right hip showed extensive right iliopsoas abscess with extensive myositis and osteomyelitis of the right sacrum and iliac bone. The abscess extends to the posterior aspect of the proximal right thigh.  Patient initially accepted to ICU on 3/4 from [facility] and concern for septic shock and for surgical/GYN input.  During the ICU course her blood cultures were positive for MRSA, pelvic abscess culture also showed abundant staph aureus on 3/3.  She received 1 dose of daptomycin/clindamycin/cefepime on 3/3, subsequently managed with IV vancomycin only from 3/3 to now. CT of the neck on 3/7/2021showed multiple peripheral predominant nodules at the lung apices suspicious for septic emboli and a right empyema with inflammation and stranding on the right retroclavicular region and right lower neck consistent with soft tissue infection. She was stabilized and transferred to hospitalist service on 3/8.IR was consulted who performe ultrasound-guided thoracentesis on3/03/2020, aspirated 14 mL of purulent material. Culture grew MRSA.CT of the abdomen and pelvis 3/11/2021showed Continued significant enlargement of right ileo psoas muscle with multiple irregular and peripherally enhancing low densities throughout its course, most consistent with abscesses.IR was reconsultedto perform gluteal abscess and right iliopsoas abscess needle aspiration on 08/11/2019.  She now has a right-sided psoas drain in place.  Consultants  . Infectious disease . Interventional Radiology . CTS . Orthopedic surgery . OB/Gyn . General Surgery . PCCM  Procedures  . Drain  placed for gluteal abscess after needle aspiration . IT Thoracentesis . Triple lumen catheter placement . IT drainage of abscess in iliacus .   Antibiotics   Anti-infectives (From admission, onward)   Start     Dose/Rate Route Frequency Ordered Stop   08/24/19 2000  vancomycin (VANCOREADY) IVPB 750 mg/150 mL     750 mg 150 mL/hr over 60 Minutes Intravenous Every 8 hours 08/24/19 1506     08/16/19 2000  vancomycin (VANCOCIN) IVPB 1000 mg/200 mL premix  Status:  Discontinued     1,000 mg 200 mL/hr over 60 Minutes Intravenous Every 8 hours 08/16/19 1601 08/24/19 1506   08/15/19 0400  vancomycin (VANCOREADY) IVPB 1250 mg/250 mL  Status:  Discontinued     1,250 mg 166.7 mL/hr over 90 Minutes Intravenous Every 8 hours 08/14/19 1132 08/16/19 1601   08/14/19 2000  vancomycin (VANCOREADY) IVPB 1500 mg/300 mL     1,500 mg 150 mL/hr over 120 Minutes Intravenous  Once 08/14/19 1132 08/14/19 2217   08/11/19 0900  ceftaroline (TEFLARO) 600 mg in sodium chloride 0.9 % 250 mL IVPB  Status:  Discontinued     600 mg 250 mL/hr over 60 Minutes Intravenous Every 8 hours 08/11/19 0859 08/14/19 1109   08/10/19 2200  linezolid (ZYVOX) IVPB 600 mg  Status:  Discontinued     600 mg 300 mL/hr over 60 Minutes Intravenous Every 12 hours 08/10/19 1732 08/10/19 1732   08/10/19 2200  ceftaroline (TEFLARO) 600 mg in sodium chloride 0.9 % 250 mL IVPB  Status:  Discontinued     600 mg 250 mL/hr over 60 Minutes Intravenous Every 12 hours 08/10/19 1733 08/11/19 0859   08/10/19 1830  DAPTOmycin (CUBICIN) 500 mg in sodium chloride 0.9 % IVPB  Status:  Discontinued     500 mg 220 mL/hr over 30 Minutes Intravenous Daily 08/10/19 1801 08/14/19 1109   08/07/19 1800  vancomycin (VANCOREADY) IVPB 1500 mg/300 mL  Status:  Discontinued     1,500 mg 150 mL/hr over 120 Minutes Intravenous Every 8 hours 08/07/19 1344 08/10/19 1732   08/06/19 2300  vancomycin (VANCOREADY) IVPB 2000 mg/400 mL  Status:  Discontinued     2,000 mg  200 mL/hr over 120 Minutes Intravenous Every 8 hours 08/06/19 2230 08/07/19 1344   08/05/19 1400  vancomycin (VANCOCIN) IVPB 1000 mg/200 mL premix  Status:  Discontinued     1,000 mg 200 mL/hr over 60 Minutes Intravenous Every 8 hours 08/05/19 0815 08/06/19 2229   08/03/19 2200  ceFEPIme (MAXIPIME) 2 g in sodium chloride 0.9 % 100 mL IVPB  Status:  Discontinued     2 g 200 mL/hr over 30 Minutes Intravenous Every 8 hours 08/03/19 1851 08/04/19 1027   08/03/19 2100  vancomycin (VANCOREADY) IVPB 750 mg/150 mL  Status:  Discontinued     750 mg 150 mL/hr over 60 Minutes Intravenous Every 8 hours 08/03/19 1851 08/05/19 0815   08/03/19 2000  clindamycin (CLEOCIN) IVPB 900 mg  Status:  Discontinued     900 mg 100 mL/hr over 30 Minutes Intravenous Every 8 hours 08/03/19 1851 08/04/19 1027      Subjective  The patient is resting comfortably. No new complaints.  Objective   Vitals:  Vitals:   08/27/19 0711 08/27/19 1433  BP: (!) 93/56 101/62  Pulse: (!) 110 79  Resp: 18 18  Temp: 98.9 F (37.2 C) 98.3 F (36.8 C)  SpO2: 91% 96%   Exam:  Constitutional:  . The patient is awake, alert, and oriented x 3. No acute distress. Respiratory:  . No increased work of breathing. . No wheezes, rales, or rhonchi . No tactile fremitus Cardiovascular:  . Regular rate and rhythm . No murmurs, ectopy, or gallups. . No lateral PMI. No thrills. Abdomen:  . Abdomen is soft, non-tender, non-distended . No hernias, masses, or organomegaly . Normoactive bowel sounds.  Musculoskeletal:  . No cyanosis, clubbing, or edema Skin:  . No rashes, lesions, ulcers . palpation of skin: no induration or nodules Neurologic:  . CN 2-12 intact . Sensation all 4 extremities intact Psychiatric:  . Mental status o Mood, affect appropriate o Orientation to person, place, time  . judgment and insight appear intact  I have personally reviewed the following:   Today's Data  . Vitals  Scheduled Meds: .  Chlorhexidine Gluconate Cloth  6 each Topical Daily  . docusate sodium  100 mg Oral BID  . DULoxetine  30 mg Oral Daily  . enoxaparin (LOVENOX) injection  40 mg Subcutaneous QHS  . feeding supplement (ENSURE ENLIVE)  237 mL Oral TID BM  . gabapentin  300 mg Oral TID  . nicotine  14 mg Transdermal Daily  . polyethylene glycol  17 g Oral Daily  . QUEtiapine  150 mg Oral QHS  . sodium chloride flush  10-40 mL Intracatheter Q12H  . traZODone  150 mg Oral QHS   Continuous Infusions: . sodium chloride 10 mL/hr (08/20/19 0600)  . vancomycin 750 mg (08/27/19 1225)    Principal Problem:   MRSA bacteremia Active Problems:   Sepsis ([place])   Iliopsoas abscess on right ([place])   Septic shock ([place])   Pain of right clavicle   Right hand pain   Osteomyelitis of sacrum ([place])  Pleural empyema ([facility])   Pneumonia   Empyema ([place])   Abscess   LOS: 24 days   A & P  Disseminated MRSA infection/bacteremia with sepsis secondary to iliopsoas/gluteal abscess.  With complications including but not limited to sacral osteomyelitis/myositis/septic arthritis of the right SI joint.  Also possible infection of the right dorsum hand and right sternoclavicular joint. Continue IV vancomycin per ID recommendations for 6 weeks-started 3/3-stop date 4/14 followed by PO doxycycline x 14 days.  CRP / ESR to repeat prior to D/C (orders entered by ID) Continue weekly CBC and twice weekly BMP. She is not a candidate for home IV therapy given IVDA.   Appreciate ID, IR and orthopedics (Dr. Judee Clara) input.    Right chest empyema:  thoracic surgery was initially consulted, they recommended IR consult due to lack of accessible fluid collection. IR evaluated patient, s/p ultrasound guided thoracentesis with removal of 13 cc of purulent fluid on March 10, collection was too small the time no chest tube placement recommended.ID been following-continue medical management with IV vancomycin.  Peritonitis/IUD perforation:CT scan of the  abdomen her abdominal exam is benign. Gynecology and general surgery was consulted and they have signed off.They recommended no surgical intervention at this time.  Bipolar disorder/anxiety/depression: Home medications Cymbalta, Seroquel trazodone were held initially but now resumed.  Hypokalemia: Normalized now.  Replace as needed, mag level adequate at 2.0  Thrombocytosis: Likely reactive, improving  Normocytic anemia, in the setting of acute illness.  Hemoglobin has been stable about 9 the last few days.  Monitor.    Polysubstance abuse: Patient has h/o IVDU and smoking.Previously documented narcotic seeking behavior, please refer notes on March 16 by Dr. Aileen Fass.  She expressed interest in starting Suboxone when her pain gets better in the next few days, continue current pain regimen for now.  Moderate Protein calorie malnutrition: Albumin level at 2.2. Ensure Enlive poTID, each supplement provides 350 kcal and 20 grams of protein per RD.  I have seen and examined this patient myself. I have spent 32 minutes in her evaluation and care.  DVT prophylaxis: Lovenox CODE STATUS: Full code Communication: None available Disposition: Patient is from home prior to admission.  She is receiving inpatient IV antibiotics as not a candidate for home IV therapy until 09/13/2019.  She will be discharged home after completion of IV antibiotics, however expresses plan to move to [place] to live with her dad eventually.  Aurelio Mccamy, DO [facility] Direct contact: see [email or web address]  7PM-7AM contact night coverage as above 08/27/2019, 4:43 PM  LOS: 23 days

## 2019-08-27 NOTE — Progress Notes (Signed)
Pharmacy Antibiotic Note  Leslie Molina is a 34 y.o. female admitted on 08/03/2019 with MRSA bacteremia/abscess w/ osteo of saacrum and ischium. Patient continuing on vancomycin for disseminated MRSA infection. ID following and plans are for a total of 6 weeks antibiotics (end date 09/13/19) -WBC= 6.1, SCr= 0.8  Vancomycin peak = 28, trough = 9 - calculated AUC 449 (at goal)   Plan: Continue vancomycin 750mg  IV q8h   Height: 5\' 1"  (154.9 cm) Weight: 148 lb 2.4 oz (67.2 kg) IBW/kg (Calculated) : 47.8  Temp (24hrs), Avg:98.6 F (37 C), Min:98.3 F (36.8 C), Max:98.9 F (37.2 C)  Recent Labs  Lab 08/21/19 0458 08/23/19 0425 08/24/19 0605 08/24/19 1130 08/25/19 0612 08/27/19 0607 08/27/19 2000  WBC 6.1  --   --   --   --   --   --   CREATININE 0.84 0.66  --   --  0.57  --   --   VANCOTROUGH  --   --   --  13*  --   --  9*  VANCOPEAK  --   --  38  --   --  28*  --     Estimated Creatinine Clearance: 87 mL/min (by C-G formula based on SCr of 0.57 mg/dL).    No Known Allergies  Antimicrobials this admission: 3/3 metronidazole x 1 3/3 vancomycin>>3/11 3/3 cefepime>>3/5 3/4 clindamycin >>3/5 3/11 Ceftaroline>>3/15 3/4 daptomycin 500mg  x 1; resume 3/11 >> 3/15  3/15 vancomycin >>  Microbiology results: 3/4 repeats: staph aureus 3/3BCx:GPC clusters 4/4 bottles (BCID = MRSA) 3/3 iliopsoas abscess:  MRSA  3/6 blood x2- neg 3/10 pleural fluid- staph aureus 3/12 wound culture - staph aureus   Thank you for allowing pharmacy to be a part of this patient's care.  Marguerite Olea, [facility] Clinical Pharmacist Phone [phone]  08/27/2019 9:30 PM

## 2019-08-28 LAB — CBC WITH DIFFERENTIAL/PLATELET
Abs Immature Granulocytes: 0.03 10*3/uL (ref 0.00–0.07)
Basophils Absolute: 0 10*3/uL (ref 0.0–0.1)
Basophils Relative: 1 %
Eosinophils Absolute: 0.2 10*3/uL (ref 0.0–0.5)
Eosinophils Relative: 4 %
HCT: 29.9 % — ABNORMAL LOW (ref 36.0–46.0)
Hemoglobin: 9.5 g/dL — ABNORMAL LOW (ref 12.0–15.0)
Immature Granulocytes: 1 %
Lymphocytes Relative: 29 %
Lymphs Abs: 1.5 10*3/uL (ref 0.7–4.0)
MCH: 27.1 pg (ref 26.0–34.0)
MCHC: 31.8 g/dL (ref 30.0–36.0)
MCV: 85.2 fL (ref 80.0–100.0)
Monocytes Absolute: 0.6 10*3/uL (ref 0.1–1.0)
Monocytes Relative: 11 %
Neutro Abs: 2.9 10*3/uL (ref 1.7–7.7)
Neutrophils Relative %: 54 %
Platelets: 388 10*3/uL (ref 150–400)
RBC: 3.51 MIL/uL — ABNORMAL LOW (ref 3.87–5.11)
RDW: 13.6 % (ref 11.5–15.5)
WBC: 5.2 10*3/uL (ref 4.0–10.5)
nRBC: 0 % (ref 0.0–0.2)

## 2019-08-28 LAB — BASIC METABOLIC PANEL
Anion gap: 11 (ref 5–15)
BUN: 8 mg/dL (ref 6–20)
CO2: 28 mmol/L (ref 22–32)
Calcium: 8.9 mg/dL (ref 8.9–10.3)
Chloride: 100 mmol/L (ref 98–111)
Creatinine, Ser: 0.63 mg/dL (ref 0.44–1.00)
GFR calc Af Amer: >60 mL/min (ref >60)
GFR calc non Af Amer: >60 mL/min (ref >60)
Glucose, Bld: 113 mg/dL — ABNORMAL HIGH (ref 70–99)
Potassium: 4.4 mmol/L (ref 3.5–5.1)
Sodium: 139 mmol/L (ref 135–145)

## 2019-08-28 NOTE — Progress Notes (Signed)
PROGRESS NOTE  Leslie Molina XIH:[id number] DOB: [birth year] DOA: 08/03/2019 [facility]CP: [facility]  Brief History   34 y.o.femalepast medical history of bipolar disorder anxiety and IV drug abuse presents to the emergency room twice within a week with lower back pain she is found to have a temperature of 102 and right extremity pain an MRI of the right hip showed extensive right iliopsoas abscess with extensive myositis and osteomyelitis of the right sacrum and iliac bone. The abscess extends to the posterior aspect of the proximal right thigh.  Patient initially accepted to ICU on 3/4 from [facility] and concern for septic shock and for surgical/GYN input.  During the ICU course her blood cultures were positive for MRSA, pelvic abscess culture also showed abundant staph aureus on 3/3.  She received 1 dose of daptomycin/clindamycin/cefepime on 3/3, subsequently managed with IV vancomycin only from 3/3 to now. CT of the neck on 3/7/2021showed multiple peripheral predominant nodules at the lung apices suspicious for septic emboli and a right empyema with inflammation and stranding on the right retroclavicular region and right lower neck consistent with soft tissue infection. She was stabilized and transferred to hospitalist service on 3/8.IR was consulted who performe ultrasound-guided thoracentesis on3/03/2020, aspirated 14 mL of purulent material. Culture grew MRSA.CT of the abdomen and pelvis 3/11/2021showed Continued significant enlargement of right ileo psoas muscle with multiple irregular and peripherally enhancing low densities throughout its course, most consistent with abscesses.IR was reconsultedto perform gluteal abscess and right iliopsoas abscess needle aspiration on 08/11/2019.  She now has a right-sided psoas drain in place.  Consultants  . Infectious disease . Interventional Radiology . CTS . Orthopedic surgery . OB/Gyn . General Surgery . PCCM  Procedures  . Drain  placed for gluteal abscess after needle aspiration . IR Thoracentesis . Triple lumen catheter placement . IR drainage of abscess in iliacus  Antibiotics   Anti-infectives (From admission, onward)   Start     Dose/Rate Route Frequency Ordered Stop   08/24/19 2000  vancomycin (VANCOREADY) IVPB 750 mg/150 mL     750 mg 150 mL/hr over 60 Minutes Intravenous Every 8 hours 08/24/19 1506     08/16/19 2000  vancomycin (VANCOCIN) IVPB 1000 mg/200 mL premix  Status:  Discontinued     1,000 mg 200 mL/hr over 60 Minutes Intravenous Every 8 hours 08/16/19 1601 08/24/19 1506   08/15/19 0400  vancomycin (VANCOREADY) IVPB 1250 mg/250 mL  Status:  Discontinued     1,250 mg 166.7 mL/hr over 90 Minutes Intravenous Every 8 hours 08/14/19 1132 08/16/19 1601   08/14/19 2000  vancomycin (VANCOREADY) IVPB 1500 mg/300 mL     1,500 mg 150 mL/hr over 120 Minutes Intravenous  Once 08/14/19 1132 08/14/19 2217   08/11/19 0900  ceftaroline (TEFLARO) 600 mg in sodium chloride 0.9 % 250 mL IVPB  Status:  Discontinued     600 mg 250 mL/hr over 60 Minutes Intravenous Every 8 hours 08/11/19 0859 08/14/19 1109   08/10/19 2200  linezolid (ZYVOX) IVPB 600 mg  Status:  Discontinued     600 mg 300 mL/hr over 60 Minutes Intravenous Every 12 hours 08/10/19 1732 08/10/19 1732   08/10/19 2200  ceftaroline (TEFLARO) 600 mg in sodium chloride 0.9 % 250 mL IVPB  Status:  Discontinued     600 mg 250 mL/hr over 60 Minutes Intravenous Every 12 hours 08/10/19 1733 08/11/19 0859   08/10/19 1830  DAPTOmycin (CUBICIN) 500 mg in sodium chloride 0.9 % IVPB  Status:  Discontinued     500 mg 220 mL/hr over 30 Minutes Intravenous Daily 08/10/19 1801 08/14/19 1109   08/07/19 1800  vancomycin (VANCOREADY) IVPB 1500 mg/300 mL  Status:  Discontinued     1,500 mg 150 mL/hr over 120 Minutes Intravenous Every 8 hours 08/07/19 1344 08/10/19 1732   08/06/19 2300  vancomycin (VANCOREADY) IVPB 2000 mg/400 mL  Status:  Discontinued     2,000 mg 200  mL/hr over 120 Minutes Intravenous Every 8 hours 08/06/19 2230 08/07/19 1344   08/05/19 1400  vancomycin (VANCOCIN) IVPB 1000 mg/200 mL premix  Status:  Discontinued     1,000 mg 200 mL/hr over 60 Minutes Intravenous Every 8 hours 08/05/19 0815 08/06/19 2229   08/03/19 2200  ceFEPIme (MAXIPIME) 2 g in sodium chloride 0.9 % 100 mL IVPB  Status:  Discontinued     2 g 200 mL/hr over 30 Minutes Intravenous Every 8 hours 08/03/19 1851 08/04/19 1027   08/03/19 2100  vancomycin (VANCOREADY) IVPB 750 mg/150 mL  Status:  Discontinued     750 mg 150 mL/hr over 60 Minutes Intravenous Every 8 hours 08/03/19 1851 08/05/19 0815   08/03/19 2000  clindamycin (CLEOCIN) IVPB 900 mg  Status:  Discontinued     900 mg 100 mL/hr over 30 Minutes Intravenous Every 8 hours 08/03/19 1851 08/04/19 1027      Subjective  The patient is resting comfortably. No new complaints.  Objective   Vitals:  Vitals:   08/28/19 0416 08/28/19 1450  BP: 92/63 91/61  Pulse: 99 (!) 114  Resp:  20  Temp: 98.5 F (36.9 C) 98.7 F (37.1 C)  SpO2: 100% 97%   Exam:  Constitutional:  . The patient is awake, alert, and oriented x 3. No acute distress. Respiratory:  . No increased work of breathing. . No wheezes, rales, or rhonchi . No tactile fremitus Cardiovascular:  . Regular rate and rhythm . No murmurs, ectopy, or gallups. . No lateral PMI. No thrills. Abdomen:  . Abdomen is soft, non-tender, non-distended . No hernias, masses, or organomegaly . Normoactive bowel sounds.  Musculoskeletal:  . No cyanosis, clubbing, or edema Skin:  . No rashes, lesions, ulcers . palpation of skin: no induration or nodules Neurologic:  . CN 2-12 intact . Sensation all 4 extremities intact Psychiatric:  . Mental status o Mood, affect appropriate o Orientation to person, place, time  . judgment and insight appear intact  I have personally reviewed the following:   Today's Data  . Vitals  Scheduled Meds: . Chlorhexidine  Gluconate Cloth  6 each Topical Daily  . docusate sodium  100 mg Oral BID  . DULoxetine  30 mg Oral Daily  . enoxaparin (LOVENOX) injection  40 mg Subcutaneous QHS  . feeding supplement (ENSURE ENLIVE)  237 mL Oral TID BM  . gabapentin  300 mg Oral TID  . nicotine  14 mg Transdermal Daily  . polyethylene glycol  17 g Oral Daily  . QUEtiapine  150 mg Oral QHS  . sodium chloride flush  10-40 mL Intracatheter Q12H  . traZODone  150 mg Oral QHS   Continuous Infusions: . sodium chloride 10 mL/hr (08/20/19 0600)  . vancomycin 750 mg (08/28/19 1230)    Principal Problem:   MRSA bacteremia Active Problems:   Sepsis ([place])   Iliopsoas abscess on right ([place])   Septic shock ([place])   Pain of right clavicle   Right hand pain   Osteomyelitis of sacrum ([facility])   Pleural empyema ([facility])  Pneumonia   Empyema ([place])   Abscess   LOS: 25 days   A & P  Disseminated MRSA infection/bacteremia with sepsis secondary to iliopsoas/gluteal abscess.  With complications including but not limited to sacral osteomyelitis/myositis/septic arthritis of the right SI joint.  Also possible infection of the right dorsum hand and right sternoclavicular joint. Continue IV vancomycin per ID recommendations for 6 weeks-started 3/3-stop date 4/14 followed by PO doxycycline x 14 days.  CRP / ESR to repeat prior to D/C (orders entered by ID) Continue weekly CBC and twice weekly BMP. She is not a candidate for home IV therapy given IVDA.   Appreciate ID, IR and orthopedics (Dr. Judee Clara) input.    Right chest empyema:  thoracic surgery was initially consulted, they recommended IR consult due to lack of accessible fluid collection. IR evaluated patient, s/p ultrasound guided thoracentesis with removal of 13 cc of purulent fluid on March 10, collection was too small the time no chest tube placement recommended.ID been following-continue medical management with IV vancomycin.  Peritonitis/IUD perforation:CT scan of the abdomen her  abdominal exam is benign. Gynecology and general surgery was consulted and they have signed off.They recommended no surgical intervention at this time.  Bipolar disorder/anxiety/depression: Home medications Cymbalta, Seroquel trazodone were held initially but now resumed.  Hypokalemia: Normalized now.  Replace as needed, mag level adequate at 2.0  Thrombocytosis: Likely reactive, improving  Normocytic anemia, in the setting of acute illness.  Hemoglobin has been stable about 9 the last few days.  Monitor.    Polysubstance abuse: Patient has h/o IVDU and smoking.Previously documented narcotic seeking behavior, please refer notes on March 16 by Dr. Aileen Fass.  She expressed interest in starting Suboxone when her pain gets better in the next few days, continue current pain regimen for now.  Moderate Protein calorie malnutrition: Albumin level at 2.2. Ensure Enlive poTID, each supplement provides 350 kcal and 20 grams of protein per RD.  I have seen and examined this patient myself. I have spent 32 minutes in her evaluation and care.  DVT prophylaxis: Lovenox CODE STATUS: Full code Communication: None available Disposition: Patient is from home prior to admission.  She is receiving inpatient IV antibiotics as not a candidate for home IV therapy until 09/13/2019.  She will be discharged home after completion of IV antibiotics, however expresses plan to move to [place] to live with her dad eventually.  Vanellope Passmore, DO [facility] Direct contact: see [email or web address]  7PM-7AM contact night coverage as above 08/28/2019, 6:08 PM  LOS: 23 days

## 2019-08-29 NOTE — Progress Notes (Signed)
Nutrition Follow-up  **RD working remotely**  DOCUMENTATION CODES:   Not applicable  INTERVENTION:  Continue Ensure Enlive po TID, each supplement provides 350 kcal and 20 grams of protein    NUTRITION DIAGNOSIS:   Inadequate oral intake related to decreased appetite as evidenced by per patient/family report.  Completed.  GOAL:   Patient will meet greater than or equal to 90% of their needs  Met.   MONITOR:   PO intake, Labs, Supplement acceptance, Skin, Weight trends, I & O's  REASON FOR ASSESSMENT:   Malnutrition Screening Tool    ASSESSMENT:   3/12 MST, Pt with a PMH significant for bipolar disorder, anxiety and IV drug abuse presented with lower back pain and fever. Pt admitted with sepsis secondary to MRSA bacteremia and iliopsoas abscess/osteomyelitis of the sacrum/myositis and empyema. Pt also with Iliopsoas/gluteal abscess/osteomyelitis of the sacrum and possible infection of the right dorsum hand and right sternoclavicular joint infection  3/10 thoracentesis, 21m purulent fluid aspirated 3/12 aspiration of gluteal abscess  RD unable to reach pt via phone.   PO Intake: 100% x last 8 recorded meals. Discussed pt with RN who reports pt is consuming 100% Ensure Enlive TID.   Medications reviewed and include: Colace, Miralax Labs reviewed.  UOP: [id number] hours I/O: -22,[id number] admit  Diet Order:   Diet Order            Diet Heart Room service appropriate? Yes; Fluid consistency: Thin  Diet effective now              EDUCATION NEEDS:   No education needs have been identified at this time  Skin:  Skin Assessment: Skin Integrity Issues: Skin Integrity Issues:: Other (Comment) Other: throat puncture  Last BM:  08/27/19  Height:   Ht Readings from Last 1 Encounters:  08/05/19 5' 1"  (1.549 m)    Weight:   Wt Readings from Last 1 Encounters:  08/29/19 68.2 kg    BMI:  Body mass index is 28.4 kg/m.  Estimated Nutritional  Needs:   Kcal:  1700-1900  Protein:  85-100 grams  Fluid:  >/= 1.7 L   AmaLarkin InaS, RD, LDN RD pager number and weekend/on-call pager number located in [facility]

## 2019-08-29 NOTE — Progress Notes (Signed)
PROGRESS NOTE  Leslie Molina KWI:[id number] DOB: 05-06-1986 DOA: 08/03/2019 PCP: [facility]  Brief History   34 y.o.femalepast medical history of bipolar disorder anxiety and IV drug abuse presents to the emergency room twice within a week with lower back pain she is found to have a temperature of 102 and right extremity pain an MRI of the right hip showed extensive right iliopsoas abscess with extensive myositis and osteomyelitis of the right sacrum and iliac bone. The abscess extends to the posterior aspect of the proximal right thigh.  Patient initially accepted to ICU on 3/4 from [facility] and concern for septic shock and for surgical/GYN input.  During the ICU course her blood cultures were positive for MRSA, pelvic abscess culture also showed abundant staph aureus on 3/3.  She received 1 dose of daptomycin/clindamycin/cefepime on 3/3, subsequently managed with IV vancomycin only from 3/3 to now. CT of the neck on 3/7/2021showed multiple peripheral predominant nodules at the lung apices suspicious for septic emboli and a right empyema with inflammation and stranding on the right retroclavicular region and right lower neck consistent with soft tissue infection. She was stabilized and transferred to hospitalist service on 3/8.IR was consulted who performe ultrasound-guided thoracentesis on3/03/2020, aspirated 14 mL of purulent material. Culture grew MRSA.CT of the abdomen and pelvis 3/11/2021showed Continued significant enlargement of right ileo psoas muscle with multiple irregular and peripherally enhancing low densities throughout its course, most consistent with abscesses.IR was reconsultedto perform gluteal abscess and right iliopsoas abscess needle aspiration on 08/11/2019.  She now has a right-sided psoas drain in place.  Consultants  . Infectious disease . Interventional Radiology . CTS . Orthopedic surgery . OB/Gyn . General Surgery . PCCM  Procedures  . Drain  placed for gluteal abscess after needle aspiration . IR Thoracentesis . Triple lumen catheter placement . IR drainage of abscess in iliacus  Antibiotics   Anti-infectives (From admission, onward)   Start     Dose/Rate Route Frequency Ordered Stop   08/24/19 2000  vancomycin (VANCOREADY) IVPB 750 mg/150 mL     750 mg 150 mL/hr over 60 Minutes Intravenous Every 8 hours 08/24/19 1506     08/16/19 2000  vancomycin (VANCOCIN) IVPB 1000 mg/200 mL premix  Status:  Discontinued     1,000 mg 200 mL/hr over 60 Minutes Intravenous Every 8 hours 08/16/19 1601 08/24/19 1506   08/15/19 0400  vancomycin (VANCOREADY) IVPB 1250 mg/250 mL  Status:  Discontinued     1,250 mg 166.7 mL/hr over 90 Minutes Intravenous Every 8 hours 08/14/19 1132 08/16/19 1601   08/14/19 2000  vancomycin (VANCOREADY) IVPB 1500 mg/300 mL     1,500 mg 150 mL/hr over 120 Minutes Intravenous  Once 08/14/19 1132 08/14/19 2217   08/11/19 0900  ceftaroline (TEFLARO) 600 mg in sodium chloride 0.9 % 250 mL IVPB  Status:  Discontinued     600 mg 250 mL/hr over 60 Minutes Intravenous Every 8 hours 08/11/19 0859 08/14/19 1109   08/10/19 2200  linezolid (ZYVOX) IVPB 600 mg  Status:  Discontinued     600 mg 300 mL/hr over 60 Minutes Intravenous Every 12 hours 08/10/19 1732 08/10/19 1732   08/10/19 2200  ceftaroline (TEFLARO) 600 mg in sodium chloride 0.9 % 250 mL IVPB  Status:  Discontinued     600 mg 250 mL/hr over 60 Minutes Intravenous Every 12 hours 08/10/19 1733 08/11/19 0859   08/10/19 1830  DAPTOmycin (CUBICIN) 500 mg in sodium chloride 0.9 % IVPB  Status:  Discontinued     500 mg 220 mL/hr over 30 Minutes Intravenous Daily 08/10/19 1801 08/14/19 1109   08/07/19 1800  vancomycin (VANCOREADY) IVPB 1500 mg/300 mL  Status:  Discontinued     1,500 mg 150 mL/hr over 120 Minutes Intravenous Every 8 hours 08/07/19 1344 08/10/19 1732   08/06/19 2300  vancomycin (VANCOREADY) IVPB 2000 mg/400 mL  Status:  Discontinued     2,000 mg 200  mL/hr over 120 Minutes Intravenous Every 8 hours 08/06/19 2230 08/07/19 1344   08/05/19 1400  vancomycin (VANCOCIN) IVPB 1000 mg/200 mL premix  Status:  Discontinued     1,000 mg 200 mL/hr over 60 Minutes Intravenous Every 8 hours 08/05/19 0815 08/06/19 2229   08/03/19 2200  ceFEPIme (MAXIPIME) 2 g in sodium chloride 0.9 % 100 mL IVPB  Status:  Discontinued     2 g 200 mL/hr over 30 Minutes Intravenous Every 8 hours 08/03/19 1851 08/04/19 1027   08/03/19 2100  vancomycin (VANCOREADY) IVPB 750 mg/150 mL  Status:  Discontinued     750 mg 150 mL/hr over 60 Minutes Intravenous Every 8 hours 08/03/19 1851 08/05/19 0815   08/03/19 2000  clindamycin (CLEOCIN) IVPB 900 mg  Status:  Discontinued     900 mg 100 mL/hr over 30 Minutes Intravenous Every 8 hours 08/03/19 1851 08/04/19 1027      Subjective  The patient is resting comfortably. No new complaints.  Objective   Vitals:  Vitals:   08/29/19 0503 08/29/19 1416  BP: 99/68 99/72  Pulse: 97 (!) 121  Resp: 16   Temp: 98.9 F (37.2 C) 98.4 F (36.9 C)  SpO2: 99% 100%   Exam:  Constitutional:  . The patient is awake, alert, and oriented x 3. No acute distress. Respiratory:  . No increased work of breathing. . No wheezes, rales, or rhonchi . No tactile fremitus Cardiovascular:  . Regular rate and rhythm . No murmurs, ectopy, or gallups. . No lateral PMI. No thrills. Abdomen:  . Abdomen is soft, non-tender, non-distended . No hernias, masses, or organomegaly . Normoactive bowel sounds.  Musculoskeletal:  . No cyanosis, clubbing, or edema Skin:  . No rashes, lesions, ulcers . palpation of skin: no induration or nodules Neurologic:  . CN 2-12 intact . Sensation all 4 extremities intact Psychiatric:  . Mental status o Mood, affect appropriate o Orientation to person, place, time  . judgment and insight appear intact  I have personally reviewed the following:   Today's Data  . Vitals  Scheduled Meds: . Chlorhexidine  Gluconate Cloth  6 each Topical Daily  . docusate sodium  100 mg Oral BID  . DULoxetine  30 mg Oral Daily  . enoxaparin (LOVENOX) injection  40 mg Subcutaneous QHS  . feeding supplement (ENSURE ENLIVE)  237 mL Oral TID BM  . gabapentin  300 mg Oral TID  . nicotine  14 mg Transdermal Daily  . polyethylene glycol  17 g Oral Daily  . QUEtiapine  150 mg Oral QHS  . sodium chloride flush  10-40 mL Intracatheter Q12H  . traZODone  150 mg Oral QHS   Continuous Infusions: . sodium chloride 10 mL/hr (08/20/19 0600)  . vancomycin 750 mg (08/29/19 1122)    Principal Problem:   MRSA bacteremia Active Problems:   Sepsis ([place])   Iliopsoas abscess on right ([place])   Septic shock ([place])   Pain of right clavicle   Right hand pain   Osteomyelitis of sacrum ([facility])   Pleural empyema ([facility])  Pneumonia   Empyema ([place])   Abscess   LOS: 26 days   A & P  Disseminated MRSA infection/bacteremia with sepsis secondary to iliopsoas/gluteal abscess.  With complications including but not limited to sacral osteomyelitis/myositis/septic arthritis of the right SI joint.  Also possible infection of the right dorsum hand and right sternoclavicular joint. Continue IV vancomycin per ID recommendations for 6 weeks-started 3/3-stop date 4/14 followed by PO doxycycline x 14 days.  CRP / ESR to repeat prior to D/C (orders entered by ID) Continue weekly CBC and twice weekly BMP. She is not a candidate for home IV therapy given IVDA.   Appreciate ID, IR and orthopedics (Dr. Judee Clara) input.    Right chest empyema:  thoracic surgery was initially consulted, they recommended IR consult due to lack of accessible fluid collection. IR evaluated patient, s/p ultrasound guided thoracentesis with removal of 13 cc of purulent fluid on March 10, collection was too small the time no chest tube placement recommended.ID been following-continue medical management with IV vancomycin.  Peritonitis/IUD perforation:CT scan of the abdomen her  abdominal exam is benign. Gynecology and general surgery was consulted and they have signed off.They recommended no surgical intervention at this time.  Bipolar disorder/anxiety/depression: Home medications Cymbalta, Seroquel trazodone were held initially but now resumed.  Hypokalemia: Normalized now.  Replace as needed, mag level adequate at 2.0  Thrombocytosis: Likely reactive, improving  Normocytic anemia, in the setting of acute illness.  Hemoglobin has been stable about 9 the last few days.  Monitor.    Polysubstance abuse: Patient has h/o IVDU and smoking.Previously documented narcotic seeking behavior, please refer notes on March 16 by Dr. Aileen Fass.  She expressed interest in starting Suboxone when her pain gets better in the next few days, continue current pain regimen for now.  Moderate Protein calorie malnutrition: Albumin level at 2.2. Ensure Enlive poTID, each supplement provides 350 kcal and 20 grams of protein per RD.  I have seen and examined this patient myself. I have spent 32 minutes in her evaluation and care.  DVT prophylaxis: Lovenox CODE STATUS: Full code Communication: None available Disposition: Patient is from home prior to admission.  She is receiving inpatient IV antibiotics as not a candidate for home IV therapy until 09/13/2019.  She will be discharged home after completion of IV antibiotics, however expresses plan to move to [place] to live with her dad eventually.  Bretta Fees, DO [facility] Direct contact: see [email or web address]  7PM-7AM contact night coverage as above 3/40/2021, 7:09 PM  LOS: 23 days

## 2019-08-30 DIAGNOSIS — J15212 Pneumonia due to Methicillin resistant Staphylococcus aureus: Secondary | ICD-10-CM

## 2019-08-30 DIAGNOSIS — D473 Essential (hemorrhagic) thrombocythemia: Secondary | ICD-10-CM

## 2019-08-30 DIAGNOSIS — D649 Anemia, unspecified: Secondary | ICD-10-CM

## 2019-08-30 LAB — BASIC METABOLIC PANEL
Anion gap: 12 (ref 5–15)
BUN: 8 mg/dL (ref 6–20)
CO2: 25 mmol/L (ref 22–32)
Calcium: 8.8 mg/dL — ABNORMAL LOW (ref 8.9–10.3)
Chloride: 99 mmol/L (ref 98–111)
Creatinine, Ser: 0.57 mg/dL (ref 0.44–1.00)
GFR calc Af Amer: >60 mL/min (ref >60)
GFR calc non Af Amer: >60 mL/min (ref >60)
Glucose, Bld: 101 mg/dL — ABNORMAL HIGH (ref 70–99)
Potassium: 4.1 mmol/L (ref 3.5–5.1)
Sodium: 136 mmol/L (ref 135–145)

## 2019-08-30 NOTE — Progress Notes (Signed)
Pharmacy Antibiotic Note  Leslie Molina is a 34 y.o. female admitted on 08/03/2019 with MRSA bacteremia/abscess w/ osteo of saacrum and ischium. Patient continuing on vancomycin for disseminated MRSA infection. ID following and plans are for a total of 6 weeks antibiotics (end date 09/13/19)  Vancomycin levels therapeutic over the weekend, Cr has remained stable.   Plan: Continue vancomycin 750mg  IV q8h  Will check surveillance vancomycin levels weekly  Height: 5\' 1"  (154.9 cm) Weight: 152 lb 12.5 oz (69.3 kg) IBW/kg (Calculated) : 47.8  Temp (24hrs), Avg:98.7 F (37.1 C), Min:98.4 F (36.9 C), Max:99 F (37.2 C)  Recent Labs  Lab 08/24/19 0605 08/24/19 1130 08/25/19 0612 08/27/19 0607 08/27/19 2000 08/28/19 0416 08/30/19 0703  WBC  --   --   --   --   --  5.2  --   CREATININE  --   --  0.57  --   --  0.63 0.57  VANCOTROUGH  --  13*  --   --  9*  --   --   VANCOPEAK 38  --   --  28*  --   --   --     Estimated Creatinine Clearance: 88.2 mL/min (by C-G formula based on SCr of 0.57 mg/dL).    No Known Allergies  Antimicrobials this admission: 3/3 metronidazole x 1 3/3 vancomycin>>3/11 3/3 cefepime>>3/5 3/4 clindamycin >>3/5 3/11 Ceftaroline>>3/15 3/4 daptomycin 500mg  x 1; resume 3/11 >> 3/15  3/15 vancomycin >>  Microbiology results: 3/4 repeats: staph aureus 3/3BCx:GPC clusters 4/4 bottles (BCID = MRSA) 3/3 iliopsoas abscess:  MRSA  3/6 blood x2- neg 3/10 pleural fluid- staph aureus 3/12 wound culture - staph aureus   Thank you for allowing pharmacy to be a part of this patient's care.  Arrie Senate, PharmD, BCPS Clinical Pharmacist [phone] Please check AMION for all [place] numbers 08/30/2019

## 2019-08-30 NOTE — Progress Notes (Signed)
PROGRESS NOTE  Leslie Molina TKW:[id number] DOB: [birth year]   PCP: [facility] [place]  Patient is from: Home.  DOA: 08/03/2019 LOS: 27  Brief Narrative / Interim history: 34 y.o.femalepast medical history of bipolar disorder anxiety and IV drug abuse presents to the emergency room twice within a week with lower back pain she is found to have a temperature of 102 and right extremity pain an MRI of the right hip showedextensive right iliopsoas abscesswith extensive myositis and osteomyelitis of the right sacrum and iliac bone. The abscess extends to the posterior aspect of the proximal right thigh.Patient initially accepted to ICU on 3/4 from [facility] and concern for septic shock and for surgical/GYN input. During the ICU course her blood cultures were positive for MRSA, pelvic abscess culture also showed abundant staph aureus on 3/3. She received 1 dose of daptomycin/clindamycin/cefepime on 3/3, subsequently managed with IV vancomycin only from 3/3 to now.CT of the neck on 3/7/2021showed multiple peripheral predominant nodules at the lung apices suspicious for septic emboli and a right empyemawith inflammation and stranding on the right retroclavicular region and right lower neck consistent with soft tissue infection.She was stabilized and transferred to hospitalist service on 3/8.IR was consulted who performe ultrasound-guided thoracentesis on3/03/2020, aspirated 14 mL of purulent material. Culture grew MRSA.CT of the abdomen and pelvis 3/11/2021showed Continued significant enlargement of right ileo psoas muscle withmultiple irregularand peripherally enhancing low densities throughout its course, most consistent with abscesses.IR was reconsultedto perform gluteal abscess and right iliopsoas abscessneedle aspiration on 08/11/2019. She now has a right-sided psoas drain in place.  Subjective: Seen and examined earlier this morning.  No major events overnight of this morning.   Complains hip pain which has been ongoing.  Pain improved with pain medication.  No other complaints.  She denies chest pain, dyspnea, GI or UTI symptoms.  Objective: Vitals:   08/29/19 1416 08/29/19 2110 08/30/19 0630 08/30/19 1431  BP: 99/72 111/62 (!) 99/56 (!) 97/59  Pulse: (!) 121 (!) 115 98 (!) 105  Resp:  18 16   Temp: 98.4 F (36.9 C) 98.6 F (37 C) 99 F (37.2 C) 98.5 F (36.9 C)  TempSrc: Oral Oral Oral Oral  SpO2: 100% 98% 99% 98%  Weight:   69.3 kg   Height:        Intake/Output Summary (Last 24 hours) at 08/30/2019 1813 Last data filed at 08/30/2019 1345 Gross per 24 hour  Intake 480 ml  Output 2050 ml  Net -1570 ml   Filed Weights   08/28/19 0416 08/29/19 0503 08/30/19 0630  Weight: 68.9 kg 68.2 kg 69.3 kg    Examination:  GENERAL: No acute distress.  Appears well.  HEENT: MMM.  Vision and hearing grossly intact.  NECK: Supple.  No apparent JVD.  RESP:  No IWOB. Good air movement bilaterally. CVS:  RRR. Heart sounds normal.  ABD/GI/GU: Bowel sounds present. Soft. Non tender.  MSK/EXT:  Moves extremities. No apparent deformity. No edema.  SKIN: no apparent skin lesion or wound NEURO: Awake, alert and oriented appropriately.  No apparent focal neuro deficit. PSYCH: Calm. Normal affect.  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 doxycycline for  14 days -CRP and ESR prior to discharge -Weekly CBC with differential and biweekly BMP -We will curbside ID once close to discharge. -Continue oxycodone for pain control  Right chest empyema -CTS recommended IR consult.    -US-guided thora with removal of 13 cc purulent fluid by IR on 3/10  Possible peritonitis/IUD perforation-noted on CT abdomen and pelvis -No surgical intervention per GYN and general surgery  Anxiety/depression/bipolar disorder: Stable -Continue home Cymbalta, Seroquel and trazodone  Thrombocytosis: Likely reactive -Continue monitoring  Normocytic anemia: H&H stable. -Monitor intermittently -Check anemia panel  Polysubstance abuse/IVDU: Previously voiced interest in Suboxone when pain under good control. -Continue oxycodone for now -May start Suboxone when pain fairly controlled.     Nutrition Problem: Inadequate oral intake Etiology: decreased appetite  Signs/Symptoms: per patient/family report  Interventions: Ensure Enlive (each supplement provides 350kcal and 20 grams of protein)   DVT prophylaxis: Subcu Lovenox Code Status: Full code Family Communication: Patient and/or RN. Available if any question.   Discharge barrier: IV antibiotics from disseminated MRSA bacteremia.  Not a candidate for home IV therapy due to IVDU Patient is from: Home Final disposition: Home on 09/13/2019  Consultants:   Infectious disease  Interventional Radiology  CTS  Orthopedic surgery  OB/Gyn  General Surgery  PCCM  Microbiology summarized: 3/4-blood cultures with MRSA 3/6-blood cultures negative 3/10-pleural fluid with MRSA 3/12-right gluteal abscess culture with MRSA 3/18-urine cultures negative  Sch Meds:  Scheduled Meds: . Chlorhexidine Gluconate Cloth  6 each Topical Daily  . docusate sodium  100 mg Oral BID  . DULoxetine  30 mg Oral Daily  . enoxaparin (LOVENOX) injection  40 mg Subcutaneous QHS  . feeding supplement (ENSURE ENLIVE)  237 mL Oral TID BM  . gabapentin  300 mg Oral TID  . nicotine  14 mg Transdermal Daily  . polyethylene glycol  17 g Oral Daily  . QUEtiapine  150 mg Oral QHS  . sodium chloride flush  10-40 mL Intracatheter Q12H  . traZODone  150  mg Oral QHS   Continuous Infusions: . sodium chloride 10 mL/hr (08/20/19 0600)  . vancomycin 750 mg (08/30/19 1210)   PRN Meds:.sodium chloride, acetaminophen, fentaNYL (SUBLIMAZE) injection, ibuprofen, ondansetron (ZOFRAN) IV, oxyCODONE, simethicone  Antimicrobials: Anti-infectives (From admission, onward)   Start     Dose/Rate Route Frequency Ordered Stop   08/24/19 2000  vancomycin (VANCOREADY) IVPB 750 mg/150 mL     750 mg 150 mL/hr over 60 Minutes Intravenous Every 8 hours 08/24/19 1506     08/16/19 2000  vancomycin (VANCOCIN) IVPB 1000 mg/200 mL premix  Status:  Discontinued     1,000 mg 200 mL/hr over 60 Minutes Intravenous Every 8 hours 08/16/19 1601 08/24/19 1506   08/15/19 0400  vancomycin (VANCOREADY) IVPB 1250 mg/250 mL  Status:  Discontinued     1,250 mg 166.7 mL/hr over 90 Minutes Intravenous Every 8 hours 08/14/19 1132 08/16/19 1601   08/14/19 2000  vancomycin (VANCOREADY) IVPB 1500 mg/300 mL     1,500 mg 150 mL/hr over 120 Minutes Intravenous  Once 08/14/19 1132 08/14/19 2217   08/11/19 0900  ceftaroline (TEFLARO) 600 mg in sodium chloride 0.9 % 250 mL IVPB  Status:  Discontinued     600 mg 250 mL/hr over 60 Minutes Intravenous Every 8 hours 08/11/19 0859 08/14/19 1109   08/10/19 2200  linezolid (ZYVOX) IVPB 600 mg  Status:  Discontinued     600 mg 300 mL/hr over 60 Minutes Intravenous Every 12 hours 08/10/19 1732 08/10/19 1732  08/10/19 2200  ceftaroline (TEFLARO) 600 mg in sodium chloride 0.9 % 250 mL IVPB  Status:  Discontinued     600 mg 250 mL/hr over 60 Minutes Intravenous Every 12 hours 08/10/19 1733 08/11/19 0859   08/10/19 1830  DAPTOmycin (CUBICIN) 500 mg in sodium chloride 0.9 % IVPB  Status:  Discontinued     500 mg 220 mL/hr over 30 Minutes Intravenous Daily 08/10/19 1801 08/14/19 1109   08/07/19 1800  vancomycin (VANCOREADY) IVPB 1500 mg/300 mL  Status:  Discontinued     1,500 mg 150 mL/hr over 120 Minutes Intravenous Every 8 hours 08/07/19 1344  08/10/19 1732   08/06/19 2300  vancomycin (VANCOREADY) IVPB 2000 mg/400 mL  Status:  Discontinued     2,000 mg 200 mL/hr over 120 Minutes Intravenous Every 8 hours 08/06/19 2230 08/07/19 1344   08/05/19 1400  vancomycin (VANCOCIN) IVPB 1000 mg/200 mL premix  Status:  Discontinued     1,000 mg 200 mL/hr over 60 Minutes Intravenous Every 8 hours 08/05/19 0815 08/06/19 2229   08/03/19 2200  ceFEPIme (MAXIPIME) 2 g in sodium chloride 0.9 % 100 mL IVPB  Status:  Discontinued     2 g 200 mL/hr over 30 Minutes Intravenous Every 8 hours 08/03/19 1851 08/04/19 1027   08/03/19 2100  vancomycin (VANCOREADY) IVPB 750 mg/150 mL  Status:  Discontinued     750 mg 150 mL/hr over 60 Minutes Intravenous Every 8 hours 08/03/19 1851 08/05/19 0815   08/03/19 2000  clindamycin (CLEOCIN) IVPB 900 mg  Status:  Discontinued     900 mg 100 mL/hr over 30 Minutes Intravenous Every 8 hours 08/03/19 1851 08/04/19 1027       I have personally reviewed the following labs and images: CBC: Recent Labs  Lab 08/28/19 0416  WBC 5.2  NEUTROABS 2.9  HGB 9.5*  HCT 29.9*  MCV 85.2  PLT 388   BMP &GFR Recent Labs  Lab 08/25/19 0612 08/28/19 0416 08/30/19 0703  NA 139 139 136  K 4.5 4.4 4.1  CL 100 100 99  CO2 _0 GLUCOSE 99 113* 101*  BUN _1 CREATININE 0.57 0.63 0.57  CALCIUM 8.9 8.9 8.8*   Estimated Creatinine Clearance: 88.2 mL/min (by C-G formula based on SCr of 0.57 mg/dL). Liver & Pancreas: No results for input(s): AST, ALT, ALKPHOS, BILITOT, PROT, ALBUMIN in the last 168 hours. No results for input(s): LIPASE, AMYLASE in the last 168 hours. No results for input(s): AMMONIA in the last 168 hours. Diabetic: No results for input(s): HGBA1C in the last 72 hours. No results for input(s): GLUCAP in the last 168 hours. Cardiac Enzymes: No results for input(s): CKTOTAL, CKMB, CKMBINDEX, TROPONINI in the last 168 hours. No results for input(s): PROBNP in the last 8760 hours. Coagulation  Profile: No results for input(s): INR, PROTIME in the last 168 hours. Thyroid Function Tests: No results for input(s): TSH, T4TOTAL, FREET4, T3FREE, THYROIDAB in the last 72 hours. Lipid Profile: No results for input(s): CHOL, HDL, LDLCALC, TRIG, CHOLHDL, LDLDIRECT in the last 72 hours. Anemia Panel: No results for input(s): VITAMINB12, FOLATE, FERRITIN, TIBC, IRON, RETICCTPCT in the last 72 hours. Urine analysis:    Component Value Date/Time   COLORURINE YELLOW 08/17/2019 1330   APPEARANCEUR CLOUDY (A) 08/17/2019 1330   APPEARANCEUR Clear 04/03/2014 0059   LABSPEC 1.011 08/17/2019 1330   LABSPEC 1.016 04/03/2014 0059   PHURINE 7.0 08/17/2019 1330   GLUCOSEU NEGATIVE 08/17/2019 1330   GLUCOSEU Negative  04/03/2014 0059   HGBUR NEGATIVE 08/17/2019 1330   BILIRUBINUR NEGATIVE 08/17/2019 1330   BILIRUBINUR Negative 04/03/2014 0059   KETONESUR NEGATIVE 08/17/2019 1330   PROTEINUR NEGATIVE 08/17/2019 1330   NITRITE NEGATIVE 08/17/2019 1330   LEUKOCYTESUR LARGE (A) 08/17/2019 1330   LEUKOCYTESUR Negative 04/03/2014 0059   Sepsis Labs: Invalid input(s): PROCALCITONIN, [place]  Microbiology: No results found for this or any previous visit (from the past 240 hour(s)).  Radiology Studies: No results found.    Monifa Blanchette T. Garrison  If 7PM-7AM, please contact night-coverage [email or web address] Password [facility] 08/30/2019, 6:13 PM

## 2019-08-31 DIAGNOSIS — D509 Iron deficiency anemia, unspecified: Secondary | ICD-10-CM

## 2019-08-31 LAB — VITAMIN B12: Vitamin B-12: 435 pg/mL (ref 180–914)

## 2019-08-31 LAB — IRON AND TIBC
Iron: 25 ug/dL — ABNORMAL LOW (ref 28–170)
Saturation Ratios: 7 % — ABNORMAL LOW (ref 10.4–31.8)
TIBC: 343 ug/dL (ref 250–450)
UIBC: 318 ug/dL

## 2019-08-31 LAB — FOLATE: Folate: 11.8 ng/mL (ref >5.9)

## 2019-08-31 LAB — RETICULOCYTES
Immature Retic Fract: 24.9 % — ABNORMAL HIGH (ref 2.3–15.9)
RBC.: 3.61 MIL/uL — ABNORMAL LOW (ref 3.87–5.11)
Retic Count, Absolute: 115.5 10*3/uL (ref 19.0–186.0)
Retic Ct Pct: 3.2 % — ABNORMAL HIGH (ref 0.4–3.1)

## 2019-08-31 LAB — FERRITIN: Ferritin: 62 ng/mL (ref 11–307)

## 2019-08-31 MED ORDER — SODIUM CHLORIDE 0.9 % IV SOLN
510.0000 mg | Freq: Once | INTRAVENOUS | Status: AC
  Administered 2019-08-31: 17:00:00 510 mg via INTRAVENOUS
  Filled 2019-08-31: qty 17

## 2019-08-31 NOTE — Progress Notes (Signed)
PROGRESS NOTE  Leslie Molina GGY:[id number] DOB: May 23, 1986   PCP: [facility]  Patient is from: Home.  DOA: 08/03/2019 LOS: 28  Brief Narrative / Interim history: 34 year old female with history of anxiety/depression/bipolar disorder/IVDU presented to ED with back pain, RLE pain and fever and found to have extensive right iliopsoas abscess extending to proximal right thigh with extensive myositis, osteomyelitis of the right sacrum and iliac bone as noted on MRI.  Patient was initially admitted to ICU at [facility] on 3/4 for septic shock.   Patient has had needle aspiration and drain placement. Blood and pelvic abscess grew MRSA.  She has been on IV vancomycin since 08/03/2019.    CT of the neck on 3/7/2021showed multiple peripheral predominant nodules at the lung apices suspicious for septic emboli and a right empyemawith inflammation and stranding on the right retroclavicular region and right lower neck consistent with soft tissue infection.She was stabilized and transferred to hospitalist service on 3/8.CTS consulted and recommended IR eval.  IR performed US-guided thoracentesis on3/03/2020, aspirated 14 mL of purulent material. Pleural culture grew MRSA.  CT of the abdomen and pelvis 3/11/2021showed continued significant enlargement of right ileo psoas muscle withmultiple irregularand peripherally enhancing low densities throughout its course, most consistent with abscesses. IR was reconsulted and performed gluteal abscess and right iliopsoas abscessneedle aspiration on 08/11/2019.    Patient will be completing IV vancomycin course through 09/13/2019 in house.  Then, she will be discharged on doxycycline for 14 more days.  Patient is interested in starting Suboxone so once her pain is fairly controlled.   Subjective: Seen and examined earlier this morning.  No major events overnight or this morning.  Still with some pain in the hip.  Does not feel ready to start  Suboxone yet.  She denies chest pain, dyspnea, cough, GI or UTI symptoms.  Objective: Vitals:   08/30/19 0630 08/30/19 1431 08/30/19 2052 08/31/19 0649  BP: (!) 99/56 (!) 97/59 (!) 104/56 104/66  Pulse: 98 (!) 105 (!) 106 91  Resp: 16  18 18   Temp: 99 F (37.2 C) 98.5 F (36.9 C) 98.4 F (36.9 C) 98.4 F (36.9 C)  TempSrc: Oral Oral Oral Oral  SpO2: 99% 98% 98% 99%  Weight: 69.3 kg   68.8 kg  Height:        Intake/Output Summary (Last 24 hours) at 08/31/2019 1504 Last data filed at 08/31/2019 1307 Gross per 24 hour  Intake 640 ml  Output 1300 ml  Net -660 ml   Filed Weights   08/29/19 0503 08/30/19 0630 08/31/19 0649  Weight: 68.2 kg 69.3 kg 68.8 kg    Examination:  GENERAL: No acute distress.  Appears well.  HEENT: MMM.  Vision and hearing grossly intact.  NECK: Supple.  No apparent JVD.  RESP:  No IWOB. Good air movement bilaterally. CVS:  RRR. Heart sounds normal.  ABD/GI/GU: Bowel sounds present. Soft. Non tender.  MSK/EXT:  Moves extremities. No apparent deformity. No edema.  SKIN: no apparent skin lesion or wound NEURO: Awake, alert and oriented appropriately.  No apparent focal neuro deficit. PSYCH: Calm. Normal affect.  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 doxycycline for 14 days -CRP and ESR prior to discharge -Weekly CBC with differential and biweekly BMP -We will curbside ID once close to discharge. -Continue oxycodone for pain control-eventually transition to Suboxone when pain is fairly controlled.  Right chest empyema -CTS recommended IR consult.     -IR performed US-guided thora with removal of 13 cc purulent fluid on 3/10 -Pleural fluid culture with MRSA.  Possible peritonitis/IUD perforation-noted on CT abdomen and pelvis -No surgical intervention per GYN and general surgery  Anxiety/depression/bipolar disorder: Stable -Continue home Cymbalta, Seroquel and trazodone  Thrombocytosis: Likely reactive -Continue monitoring  Normocytic iron deficiency anemia: H&H stable.  Iron sat 7%. -IV Feraheme on 4/1 -Monitor intermittently.  Polysubstance abuse/IVDU: Previously voiced interest in Suboxone when pain under good control. -Continue oxycodone for now -Would like to start Suboxone when pain fairly controlled.     Nutrition Problem: Inadequate oral intake Etiology: decreased appetite  Signs/Symptoms: per patient/family report  Interventions: Ensure Enlive (each supplement provides 350kcal and 20 grams of protein)   DVT prophylaxis: Subcu Lovenox Code Status: Full code Family Communication: Patient and/or RN. Available if any question.   Discharge barrier: IV antibiotics from disseminated MRSA bacteremia.  Not a candidate for home IV therapy due to IVDU Patient is from: Home Final disposition: Home on 09/13/2019  Consultants:   Infectious disease  Interventional Radiology  CTS  Orthopedic surgery  OB/Gyn  General Surgery  PCCM  Microbiology summarized: 3/4-blood cultures with MRSA 3/6-blood cultures negative 3/10-pleural fluid with MRSA 3/12-right gluteal abscess culture with MRSA 3/18-urine cultures negative  Sch Meds:  Scheduled Meds: . Chlorhexidine Gluconate Cloth  6 each Topical Daily  . docusate sodium  100 mg Oral BID  . DULoxetine  30 mg Oral Daily  . enoxaparin (LOVENOX) injection  40 mg Subcutaneous QHS  . feeding supplement (ENSURE ENLIVE)  237 mL Oral TID BM  . gabapentin  300 mg Oral TID  . nicotine  14 mg Transdermal Daily  . polyethylene glycol  17 g Oral Daily  . QUEtiapine  150 mg  Oral QHS  . sodium chloride flush  10-40 mL Intracatheter Q12H  . traZODone  150 mg Oral QHS   Continuous Infusions: . sodium chloride 250 mL (08/31/19 0945)  . vancomycin 750 mg (08/31/19 1213)   PRN Meds:.sodium chloride, acetaminophen, fentaNYL (SUBLIMAZE) injection, ibuprofen, ondansetron (ZOFRAN) IV, oxyCODONE, simethicone  Antimicrobials: Anti-infectives (From admission, onward)   Start     Dose/Rate Route Frequency Ordered Stop   08/24/19 2000  vancomycin (VANCOREADY) IVPB 750 mg/150 mL     750 mg 150 mL/hr over 60 Minutes Intravenous Every 8 hours 08/24/19 1506     08/16/19 2000  vancomycin (VANCOCIN) IVPB 1000 mg/200 mL premix  Status:  Discontinued     1,000 mg 200 mL/hr over 60 Minutes Intravenous Every 8 hours 08/16/19 1601 08/24/19 1506   08/15/19 0400  vancomycin (VANCOREADY) IVPB 1250 mg/250 mL  Status:  Discontinued     1,250 mg 166.7 mL/hr over 90 Minutes Intravenous Every 8 hours 08/14/19 1132 08/16/19 1601   08/14/19 2000  vancomycin (VANCOREADY) IVPB 1500 mg/300 mL     1,500 mg 150 mL/hr over 120 Minutes Intravenous  Once 08/14/19 1132 08/14/19 2217   08/11/19 0900  ceftaroline (TEFLARO) 600 mg in sodium chloride 0.9 % 250 mL IVPB  Status:  Discontinued     600 mg 250 mL/hr over 60 Minutes Intravenous Every 8 hours 08/11/19 0859 08/14/19 1109   08/10/19 2200  linezolid (ZYVOX) IVPB 600 mg  Status:  Discontinued     600 mg 300 mL/hr over 60 Minutes Intravenous Every 12 hours 08/10/19 1732 08/10/19 1732   08/10/19 2200  ceftaroline (TEFLARO) 600 mg in sodium chloride 0.9 % 250 mL IVPB  Status:  Discontinued     600 mg 250 mL/hr over 60 Minutes Intravenous Every 12 hours 08/10/19 1733 08/11/19 0859   08/10/19 1830  DAPTOmycin (CUBICIN) 500 mg in sodium chloride 0.9 % IVPB  Status:  Discontinued     500 mg 220 mL/hr over 30 Minutes Intravenous Daily 08/10/19 1801 08/14/19 1109   08/07/19 1800  vancomycin (VANCOREADY) IVPB 1500 mg/300 mL  Status:  Discontinued      1,500 mg 150 mL/hr over 120 Minutes Intravenous Every 8 hours 08/07/19 1344 08/10/19 1732   08/06/19 2300  vancomycin (VANCOREADY) IVPB 2000 mg/400 mL  Status:  Discontinued     2,000 mg 200 mL/hr over 120 Minutes Intravenous Every 8 hours 08/06/19 2230 08/07/19 1344   08/05/19 1400  vancomycin (VANCOCIN) IVPB 1000 mg/200 mL premix  Status:  Discontinued     1,000 mg 200 mL/hr over 60 Minutes Intravenous Every 8 hours 08/05/19 0815 08/06/19 2229   08/03/19 2200  ceFEPIme (MAXIPIME) 2 g in sodium chloride 0.9 % 100 mL IVPB  Status:  Discontinued     2 g 200 mL/hr over 30 Minutes Intravenous Every 8 hours 08/03/19 1851 08/04/19 1027   08/03/19 2100  vancomycin (VANCOREADY) IVPB 750 mg/150 mL  Status:  Discontinued     750 mg 150 mL/hr over 60 Minutes Intravenous Every 8 hours 08/03/19 1851 08/05/19 0815   08/03/19 2000  clindamycin (CLEOCIN) IVPB 900 mg  Status:  Discontinued     900 mg 100 mL/hr over 30 Minutes Intravenous Every 8 hours 08/03/19 1851 08/04/19 1027       I have personally reviewed the following labs and images: CBC: Recent Labs  Lab 08/28/19 0416  WBC 5.2  NEUTROABS 2.9  HGB 9.5*  HCT 29.9*  MCV 85.2  PLT 388   BMP &GFR Recent Labs  Lab 08/25/19 0612 08/28/19 0416 08/30/19 0703  NA 139 139 136  K 4.5 4.4 4.1  CL 100 100 99  CO2 29 28 25   GLUCOSE 99 113* 101*  BUN 10 8 8   CREATININE 0.57 0.63 0.57  CALCIUM 8.9 8.9 8.8*   Estimated Creatinine Clearance: 87.9 mL/min (by C-G formula based on SCr of 0.57 mg/dL). Liver & Pancreas: No results for input(s): AST, ALT, ALKPHOS, BILITOT, PROT, ALBUMIN in the last 168 hours. No results for input(s): LIPASE, AMYLASE in the last 168 hours. No results for input(s): AMMONIA in the last 168 hours. Diabetic: No results for input(s): HGBA1C in the last 72 hours. No results for input(s): GLUCAP in the last 168 hours. Cardiac Enzymes: No results for input(s): CKTOTAL, CKMB, CKMBINDEX, TROPONINI in the last 168  hours. No results for input(s): PROBNP in the last 8760 hours. Coagulation Profile: No results for input(s): INR, PROTIME in the last 168 hours. Thyroid Function Tests: No results for input(s): TSH, T4TOTAL, FREET4, T3FREE, THYROIDAB in the last 72 hours. Lipid Profile: No results for input(s): CHOL, HDL, LDLCALC, TRIG, CHOLHDL, LDLDIRECT in the last 72 hours. Anemia Panel: Recent Labs    08/31/19 1215 08/31/19 1220 08/31/19 1225  VITAMINB12  --   --  435  FOLATE 11.8  --   --   FERRITIN  --   --  62  TIBC  --   --  343  IRON  --   --  25*  RETICCTPCT  --  3.2*  --    Urine analysis:    Component Value Date/Time   COLORURINE YELLOW 08/17/2019 1330   APPEARANCEUR CLOUDY (A) 08/17/2019 1330   APPEARANCEUR Clear 04/03/2014 0059   LABSPEC 1.011 08/17/2019 1330   LABSPEC 1.016 04/03/2014 0059   PHURINE 7.0 08/17/2019 1330   GLUCOSEU NEGATIVE 08/17/2019 1330   GLUCOSEU Negative 04/03/2014 0059   HGBUR NEGATIVE 08/17/2019 1330   BILIRUBINUR NEGATIVE 08/17/2019 1330   BILIRUBINUR Negative 04/03/2014 0059   KETONESUR NEGATIVE 08/17/2019 1330   PROTEINUR NEGATIVE 08/17/2019 1330   NITRITE NEGATIVE 08/17/2019 1330   LEUKOCYTESUR LARGE (A) 08/17/2019 1330   LEUKOCYTESUR Negative 04/03/2014 0059   Sepsis Labs: Invalid input(s): PROCALCITONIN, [place]  Microbiology: No results found for this or any previous visit (from the past 240 hour(s)).  Radiology Studies: No results found.    Shanee Batch T. [place]  If 7PM-7AM, please contact night-coverage [email or web address] Password [employer] [facility] 08/31/2019, 3:04 PM

## 2019-09-01 MED ORDER — OXYCODONE HCL 5 MG PO TABS
2.5000 mg | ORAL_TABLET | ORAL | Status: DC | PRN
  Administered 2019-09-01: 17:00:00 2.5 mg via ORAL
  Filled 2019-09-01: qty 1

## 2019-09-01 MED ORDER — BUPRENORPHINE HCL-NALOXONE HCL 8-2 MG SL SUBL
1.0000 | SUBLINGUAL_TABLET | Freq: Every day | SUBLINGUAL | Status: DC
  Administered 2019-09-01: 15:00:00 1 via SUBLINGUAL
  Filled 2019-09-01: qty 1

## 2019-09-01 MED ORDER — OXYCODONE HCL 5 MG PO TABS
5.0000 mg | ORAL_TABLET | ORAL | Status: DC | PRN
  Administered 2019-09-01 – 2019-09-02 (×2): 5 mg via ORAL
  Filled 2019-09-01 (×2): qty 1

## 2019-09-01 MED ORDER — POLYETHYLENE GLYCOL 3350 17 G PO PACK: 17.0000 g | PACK | Freq: Two times a day (BID) | ORAL | Status: DC | PRN

## 2019-09-01 MED ORDER — FENTANYL CITRATE (PF) 100 MCG/2ML IJ SOLN
50.0000 ug | Freq: Once | INTRAMUSCULAR | Status: AC
  Administered 2019-09-01: 18:00:00 50 ug via INTRAVENOUS
  Filled 2019-09-01: qty 2

## 2019-09-01 MED ORDER — SENNOSIDES-DOCUSATE SODIUM 8.6-50 MG PO TABS: 1.0000 | ORAL_TABLET | Freq: Two times a day (BID) | ORAL | Status: DC | PRN

## 2019-09-01 NOTE — Progress Notes (Signed)
PROGRESS NOTE  Leslie Molina ZDG:[id number] DOB: 03-19-1986   PCP: [facility] [place]  Patient is from: Home.  DOA: 08/03/2019 LOS: 10  Brief Narrative / Interim history: 34 year old female with history of anxiety/depression/bipolar disorder/IVDU presented to ED with back pain, RLE pain and fever and found to have extensive right iliopsoas abscess extending to proximal right thigh with extensive myositis, osteomyelitis of the right sacrum and iliac bone as noted on MRI.  Patient was initially admitted to ICU at [facility] on 3/4 for septic shock.   Patient has had needle aspiration and drain placement. Blood and pelvic abscess grew MRSA.  She has been on IV vancomycin since 08/03/2019.    CT of the neck on 3/7/2021showed multiple peripheral predominant nodules at the lung apices suspicious for septic emboli and a right empyemawith inflammation and stranding on the right retroclavicular region and right lower neck consistent with soft tissue infection.She was stabilized and transferred to hospitalist service on 3/8.CTS consulted and recommended IR eval.  IR performed US-guided thoracentesis on3/03/2020, aspirated 14 mL of purulent material. Pleural culture grew MRSA.  CT of the abdomen and pelvis 3/11/2021showed continued significant enlargement of right ileo psoas muscle withmultiple irregularand peripherally enhancing low densities throughout its course, most consistent with abscesses. IR was reconsulted and performed gluteal abscess and right iliopsoas abscessneedle aspiration on 08/11/2019.    Patient will be completing IV vancomycin course through 09/13/2019 in house.  Then, she will be discharged on doxycycline for 14 more days.  Patient was transitioned to Suboxone on 09/01/2019.   Subjective: Seen and examined earlier this morning.  No major events overnight of this morning.  No complaints.  She would like to start his Suboxone today.  She would like to discontinue nicotine  patch and daily MiraLAX.  Objective: Vitals:   08/31/19 0649 08/31/19 1629 08/31/19 2019 09/01/19 0622  BP: 104/66 94/61 (!) 97/58 93/61  Pulse: 91 96 (!) 109 92  Resp: _0 Temp: 98.4 F (36.9 C) 98.5 F (36.9 C) 98.5 F (36.9 C) 98.2 F (36.8 C)  TempSrc: Oral Oral Oral Oral  SpO2: 99% 99% 96% 96%  Weight: 68.8 kg     Height:        Intake/Output Summary (Last 24 hours) at 09/01/2019 1416 Last data filed at 09/01/2019 1200 Gross per 24 hour  Intake 547.62 ml  Output --  Net 547.62 ml   Filed Weights   08/29/19 0503 08/30/19 0630 08/31/19 0649  Weight: 68.2 kg 69.3 kg 68.8 kg    Examination:  GENERAL: No acute distress.  Appears well.  HEENT: MMM.  Vision and hearing grossly intact.  NECK: Supple.  No apparent JVD.  RESP:  No IWOB. Good air movement bilaterally. CVS:  RRR. Heart sounds normal.  ABD/GI/GU: Bowel sounds present. Soft. Non tender.  MSK/EXT:  Moves extremities. No apparent deformity. No edema.  SKIN: no apparent skin lesion or wound NEURO: Awake, alert and oriented appropriately.  No apparent focal neuro deficit. PSYCH: Calm. Normal affect.  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 doxycycline for 14 days -CRP and ESR prior to discharge -Weekly CBC with  differential and biweekly BMP -We will curbside ID once close to discharge. -Started Suboxone for pain 1/2.  Right chest empyema -CTS recommended IR consult.   -IR performed US-guided thora with removal of 13 cc purulent fluid on 3/10 -Pleural fluid culture with MRSA.  Possible peritonitis/IUD  perforation-noted on CT abdomen and pelvis -No surgical intervention per GYN and general surgery  Anxiety/depression/bipolar disorder: Stable -Continue home Cymbalta, Seroquel and trazodone  Thrombocytosis: Likely reactive -Continue monitoring  Normocytic iron deficiency anemia: H&H stable.  Iron sat 7%. -IV Feraheme on 4/1.  Will give additional dose in 3 to 4 days. -Monitor intermittently.  Polysubstance abuse/IVDU:  -Started Suboxone on 4/2.     Nutrition Problem: Inadequate oral intake Etiology: decreased appetite  Signs/Symptoms: per patient/family report  Interventions: Ensure Enlive (each supplement provides 350kcal and 20 grams of protein)   DVT prophylaxis: Subcu Lovenox Code Status: Full code Family Communication: Patient and/or RN. Available if any question.   Discharge barrier: IV antibiotics from disseminated MRSA bacteremia.  Not a candidate for home IV therapy due to IVDU Patient is from: Home Final disposition: Home on 09/13/2019  Consultants:   Infectious disease  Interventional Radiology  CTS  Orthopedic surgery  OB/Gyn  General Surgery  PCCM  Microbiology summarized: 3/4-blood cultures with MRSA 3/6-blood cultures negative 3/10-pleural fluid with MRSA 3/12-right gluteal abscess culture with MRSA 3/18-urine cultures negative  Sch Meds:  Scheduled Meds: . buprenorphine-naloxone  1 tablet Sublingual Daily  . Chlorhexidine Gluconate Cloth  6 each Topical Daily  . docusate sodium  100 mg Oral BID  . DULoxetine  30 mg Oral Daily  . enoxaparin (LOVENOX) injection  40 mg Subcutaneous QHS  . feeding supplement (ENSURE ENLIVE)  237 mL Oral TID BM  . gabapentin  300 mg Oral TID  . QUEtiapine  150 mg Oral QHS  . sodium chloride flush  10-40 mL Intracatheter Q12H  . traZODone  150 mg Oral QHS   Continuous Infusions: . sodium chloride 250 mL (09/01/19 1353)  . vancomycin 750 mg (09/01/19 1107)   PRN Meds:.sodium chloride, acetaminophen,  ibuprofen, ondansetron (ZOFRAN) IV, polyethylene glycol, senna-docusate, simethicone  Antimicrobials: Anti-infectives (From admission, onward)   Start     Dose/Rate Route Frequency Ordered Stop   08/24/19 2000  vancomycin (VANCOREADY) IVPB 750 mg/150 mL     750 mg 150 mL/hr over 60 Minutes Intravenous Every 8 hours 08/24/19 1506     08/16/19 2000  vancomycin (VANCOCIN) IVPB 1000 mg/200 mL premix  Status:  Discontinued     1,000 mg 200 mL/hr over 60 Minutes Intravenous Every 8 hours 08/16/19 1601 08/24/19 1506   08/15/19 0400  vancomycin (VANCOREADY) IVPB 1250 mg/250 mL  Status:  Discontinued     1,250 mg 166.7 mL/hr over 90 Minutes Intravenous Every 8 hours 08/14/19 1132 08/16/19 1601   08/14/19 2000  vancomycin (VANCOREADY) IVPB 1500 mg/300 mL     1,500 mg 150 mL/hr over 120 Minutes Intravenous  Once 08/14/19 1132 08/14/19 2217   08/11/19 0900  ceftaroline (TEFLARO) 600 mg in sodium chloride 0.9 % 250 mL IVPB  Status:  Discontinued     600 mg 250 mL/hr over 60 Minutes Intravenous Every 8 hours 08/11/19 0859 08/14/19 1109   08/10/19 2200  linezolid (ZYVOX) IVPB 600 mg  Status:  Discontinued     600 mg 300 mL/hr over 60 Minutes Intravenous Every 12 hours 08/10/19 1732 08/10/19 1732   08/10/19 2200  ceftaroline (TEFLARO) 600 mg in sodium chloride 0.9 % 250  mL IVPB  Status:  Discontinued     600 mg 250 mL/hr over 60 Minutes Intravenous Every 12 hours 08/10/19 1733 08/11/19 0859   08/10/19 1830  DAPTOmycin (CUBICIN) 500 mg in sodium chloride 0.9 % IVPB  Status:  Discontinued     500 mg 220 mL/hr over 30 Minutes Intravenous Daily 08/10/19 1801 08/14/19 1109   08/07/19 1800  vancomycin (VANCOREADY) IVPB 1500 mg/300 mL  Status:  Discontinued     1,500 mg 150 mL/hr over 120 Minutes Intravenous Every 8 hours 08/07/19 1344 08/10/19 1732   08/06/19 2300  vancomycin (VANCOREADY) IVPB 2000 mg/400 mL  Status:  Discontinued     2,000 mg 200 mL/hr over 120 Minutes Intravenous Every 8 hours 08/06/19  2230 08/07/19 1344   08/05/19 1400  vancomycin (VANCOCIN) IVPB 1000 mg/200 mL premix  Status:  Discontinued     1,000 mg 200 mL/hr over 60 Minutes Intravenous Every 8 hours 08/05/19 0815 08/06/19 2229   08/03/19 2200  ceFEPIme (MAXIPIME) 2 g in sodium chloride 0.9 % 100 mL IVPB  Status:  Discontinued     2 g 200 mL/hr over 30 Minutes Intravenous Every 8 hours 08/03/19 1851 08/04/19 1027   08/03/19 2100  vancomycin (VANCOREADY) IVPB 750 mg/150 mL  Status:  Discontinued     750 mg 150 mL/hr over 60 Minutes Intravenous Every 8 hours 08/03/19 1851 08/05/19 0815   08/03/19 2000  clindamycin (CLEOCIN) IVPB 900 mg  Status:  Discontinued     900 mg 100 mL/hr over 30 Minutes Intravenous Every 8 hours 08/03/19 1851 08/04/19 1027       I have personally reviewed the following labs and images: CBC: Recent Labs  Lab 08/28/19 0416  WBC 5.2  NEUTROABS 2.9  HGB 9.5*  HCT 29.9*  MCV 85.2  PLT 388   BMP &GFR Recent Labs  Lab 08/28/19 0416 08/30/19 0703  NA 139 136  K 4.4 4.1  CL 100 99  CO2 28 25  GLUCOSE 113* 101*  BUN 8 8  CREATININE 0.63 0.57  CALCIUM 8.9 8.8*   Estimated Creatinine Clearance: 87.9 mL/min (by C-G formula based on SCr of 0.57 mg/dL). Liver & Pancreas: No results for input(s): AST, ALT, ALKPHOS, BILITOT, PROT, ALBUMIN in the last 168 hours. No results for input(s): LIPASE, AMYLASE in the last 168 hours. No results for input(s): AMMONIA in the last 168 hours. Diabetic: No results for input(s): HGBA1C in the last 72 hours. No results for input(s): GLUCAP in the last 168 hours. Cardiac Enzymes: No results for input(s): CKTOTAL, CKMB, CKMBINDEX, TROPONINI in the last 168 hours. No results for input(s): PROBNP in the last 8760 hours. Coagulation Profile: No results for input(s): INR, PROTIME in the last 168 hours. Thyroid Function Tests: No results for input(s): TSH, T4TOTAL, FREET4, T3FREE, THYROIDAB in the last 72 hours. Lipid Profile: No results for input(s):  CHOL, HDL, LDLCALC, TRIG, CHOLHDL, LDLDIRECT in the last 72 hours. Anemia Panel: Recent Labs    08/31/19 1215 08/31/19 1220 08/31/19 1225  VITAMINB12  --   --  435  FOLATE 11.8  --   --   FERRITIN  --   --  62  TIBC  --   --  343  IRON  --   --  25*  RETICCTPCT  --  3.2*  --    Urine analysis:    Component Value Date/Time   COLORURINE YELLOW 08/17/2019 1330   APPEARANCEUR CLOUDY (A) 08/17/2019 1330   APPEARANCEUR Clear 04/03/2014  0059   LABSPEC 1.011 08/17/2019 1330   LABSPEC 1.016 04/03/2014 0059   PHURINE 7.0 08/17/2019 1330   GLUCOSEU NEGATIVE 08/17/2019 1330   GLUCOSEU Negative 04/03/2014 0059   HGBUR NEGATIVE 08/17/2019 1330   BILIRUBINUR NEGATIVE 08/17/2019 1330   BILIRUBINUR Negative 04/03/2014 0059   KETONESUR NEGATIVE 08/17/2019 1330   PROTEINUR NEGATIVE 08/17/2019 1330   NITRITE NEGATIVE 08/17/2019 1330   LEUKOCYTESUR LARGE (A) 08/17/2019 1330   LEUKOCYTESUR Negative 04/03/2014 0059   Sepsis Labs: Invalid input(s): PROCALCITONIN, [place]  Microbiology: No results found for this or any previous visit (from the past 240 hour(s)).  Radiology Studies: No results found.    Mystique Bjelland T. [place]  If 7PM-7AM, please contact night-coverage [email or web address] Password [email or web address] 09/01/2019, 2:16 PM

## 2019-09-01 NOTE — Plan of Care (Signed)
  Problem: Nutrition: Goal: Adequate nutrition will be maintained Outcome: Progressing   Problem: Coping: Goal: Level of anxiety will decrease Outcome: Progressing   Problem: Elimination: Goal: Will not experience complications related to bowel motility Outcome: Progressing   

## 2019-09-02 ENCOUNTER — Inpatient Hospital Stay (HOSPITAL_COMMUNITY): Payer: Medicaid Other

## 2019-09-02 LAB — CBC WITH DIFFERENTIAL/PLATELET
Abs Immature Granulocytes: 0.02 10*3/uL (ref 0.00–0.07)
Basophils Absolute: 0.1 10*3/uL (ref 0.0–0.1)
Basophils Relative: 1 %
Eosinophils Absolute: 0.2 10*3/uL (ref 0.0–0.5)
Eosinophils Relative: 3 %
HCT: 31 % — ABNORMAL LOW (ref 36.0–46.0)
Hemoglobin: 9.7 g/dL — ABNORMAL LOW (ref 12.0–15.0)
Immature Granulocytes: 0 %
Lymphocytes Relative: 26 %
Lymphs Abs: 2.1 10*3/uL (ref 0.7–4.0)
MCH: 26.6 pg (ref 26.0–34.0)
MCHC: 31.3 g/dL (ref 30.0–36.0)
MCV: 84.9 fL (ref 80.0–100.0)
Monocytes Absolute: 0.7 10*3/uL (ref 0.1–1.0)
Monocytes Relative: 8 %
Neutro Abs: 5 10*3/uL (ref 1.7–7.7)
Neutrophils Relative %: 62 %
Platelets: 393 10*3/uL (ref 150–400)
RBC: 3.65 MIL/uL — ABNORMAL LOW (ref 3.87–5.11)
RDW: 14 % (ref 11.5–15.5)
WBC: 8 10*3/uL (ref 4.0–10.5)
nRBC: 0 % (ref 0.0–0.2)

## 2019-09-02 LAB — RAPID URINE DRUG SCREEN, HOSP PERFORMED
Amphetamines: NOT DETECTED
Barbiturates: NOT DETECTED
Benzodiazepines: POSITIVE — AB
Cocaine: NOT DETECTED
Opiates: NOT DETECTED
Tetrahydrocannabinol: NOT DETECTED

## 2019-09-02 LAB — VANCOMYCIN, PEAK: Vancomycin Pk: 22 ug/mL — ABNORMAL LOW (ref 30–40)

## 2019-09-02 LAB — VANCOMYCIN, TROUGH: Vancomycin Tr: 10 ug/mL — ABNORMAL LOW (ref 15–20)

## 2019-09-02 IMAGING — CT CT HIP*R* W/CM
2 of 5 series · 16 of 46 positions shown, 18 images · IV contrast (APPLIED)
Comparison: MRI [DATE] and abdominal CT [DATE]

CONTRAST:  100mL OMNIPAQUE IOHEXOL 300 MG/ML  SOLN

CLINICAL DATA: Septic arthritis suspected.

EXAM:
CT OF THE LOWER RIGHT EXTREMITY WITH CONTRAST
TECHNIQUE: Multidetector CT imaging of the lower right extremity was performed
according to the standard protocol following intravenous contrast
administration.

[Series 5: soft tissue · axial · 0.50mm/px · z∈[-501,-239]mm · 13 of 151 slices shown, 15 images]
[im 10/151  soft-tissue]
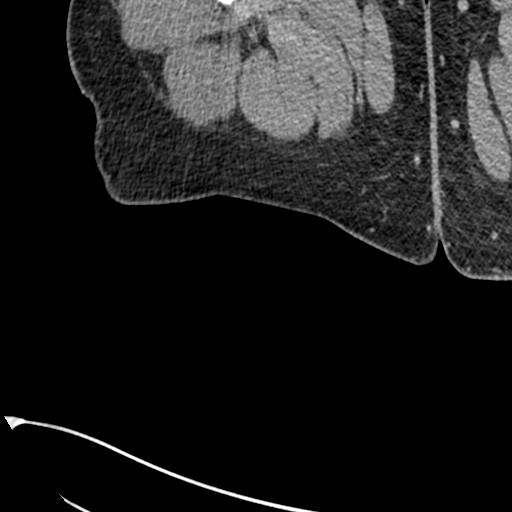
[im 10/151  bone]
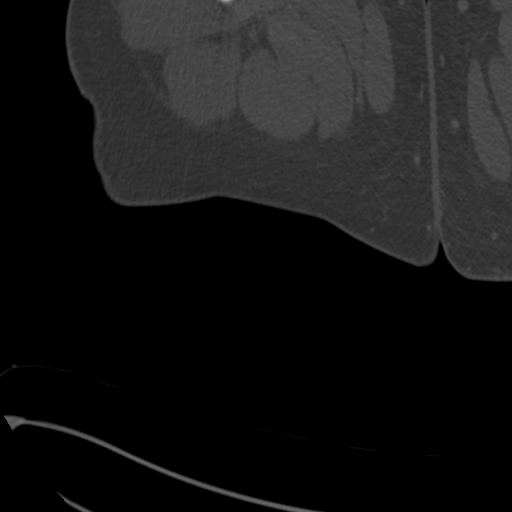
[im 20/151  soft-tissue]
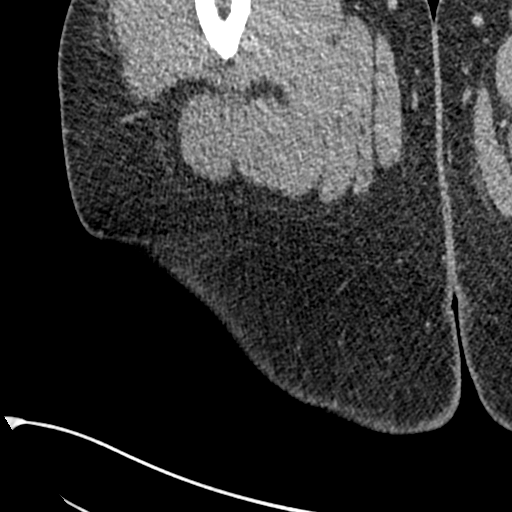
[im 30/151  soft-tissue]
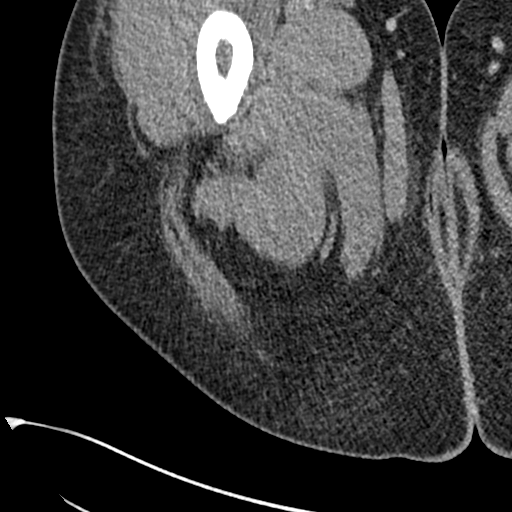
[im 44/151  soft-tissue]
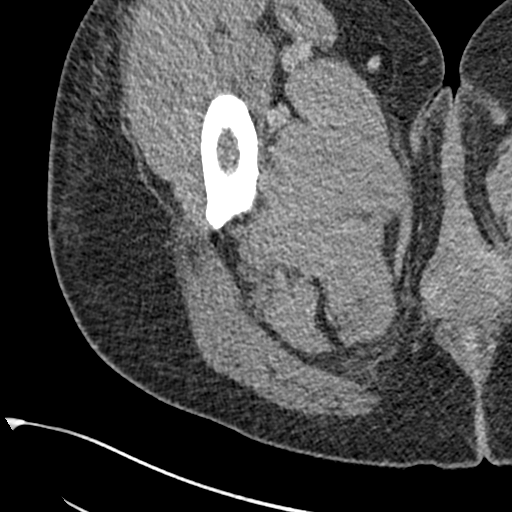
[im 54/151  soft-tissue]
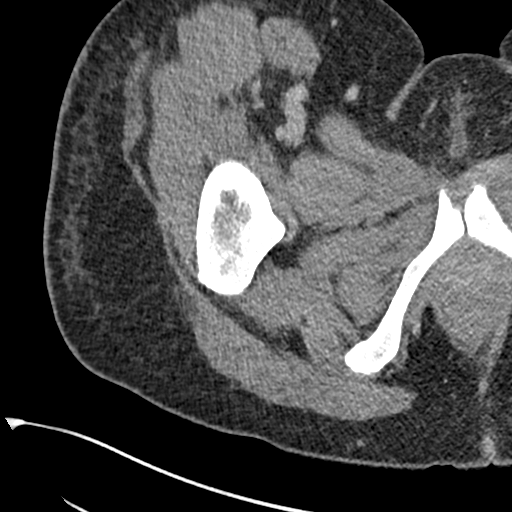
[im 63/151  soft-tissue]
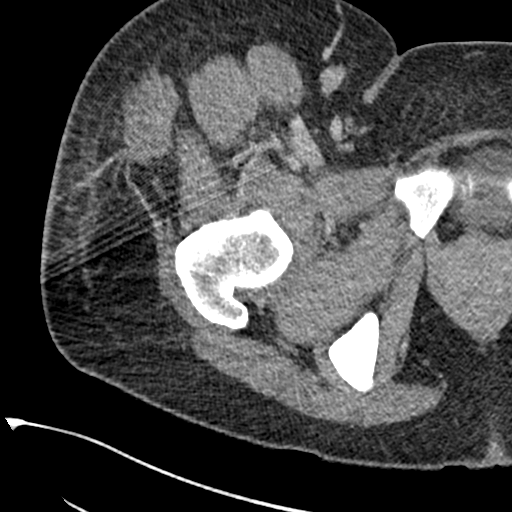
[im 78/151  soft-tissue]
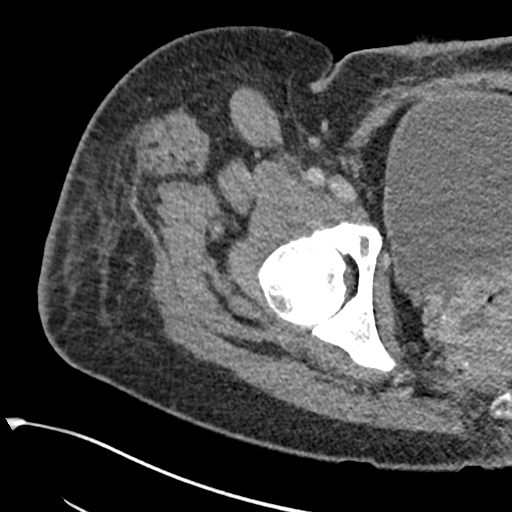
[im 88/151  soft-tissue]
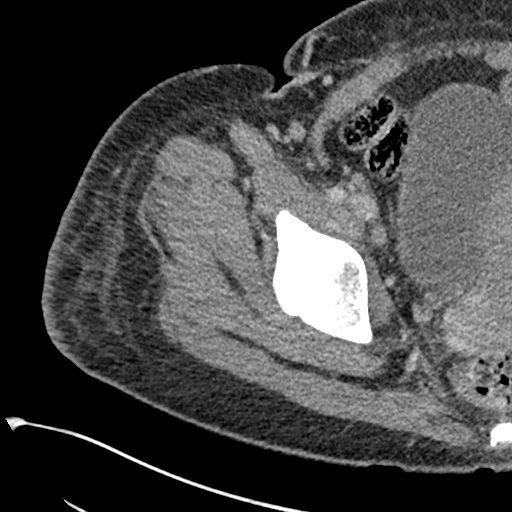
[im 97/151  soft-tissue]
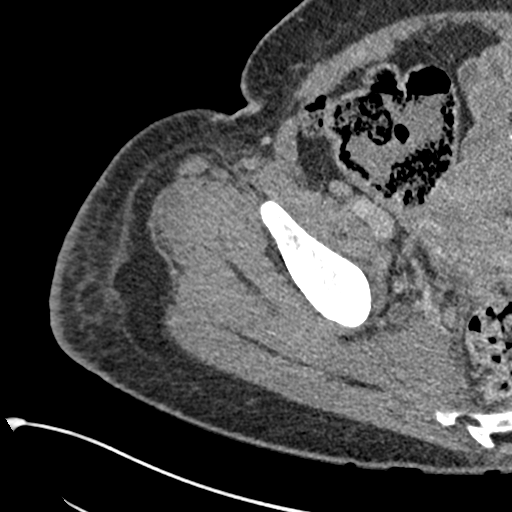
[im 97/151  bone]
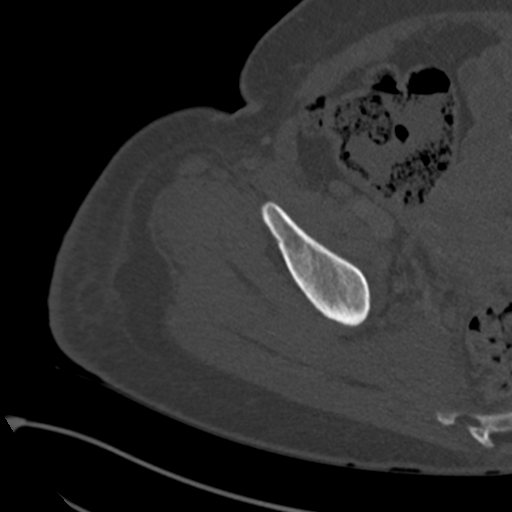
[im 107/151  soft-tissue]
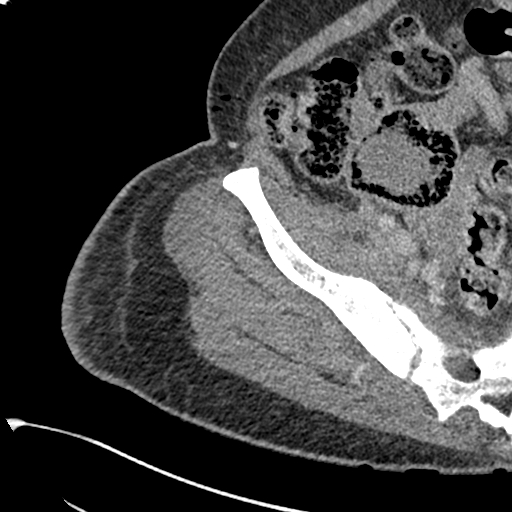
[im 121/151  soft-tissue]
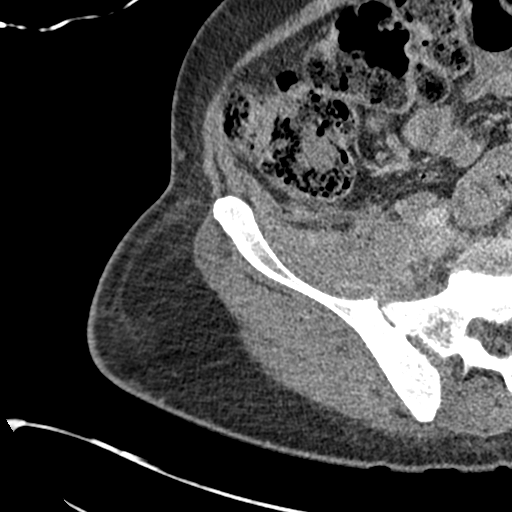
[im 131/151  soft-tissue]
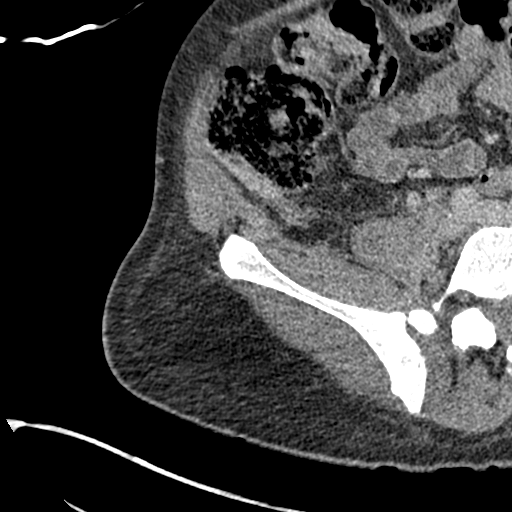
[im 141/151  soft-tissue]
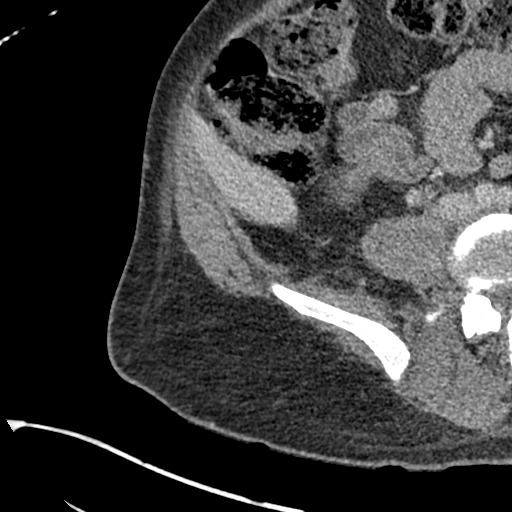

[Series 6: cor soft · coronal · 0.50mm/px · 3 of 134 slices shown]
[im 34/134  soft-tissue]
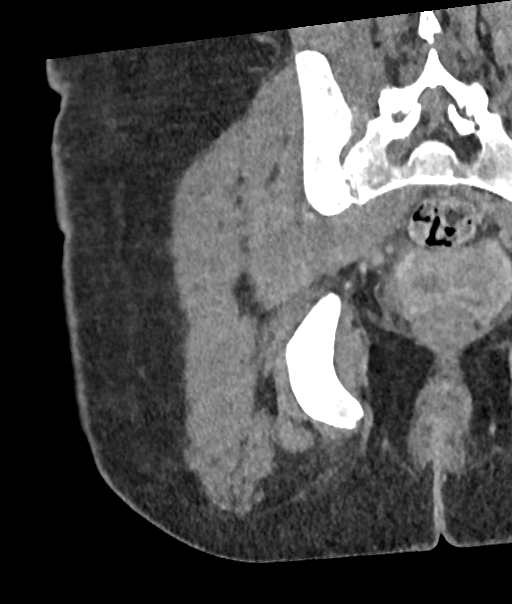
[im 67/134  soft-tissue]
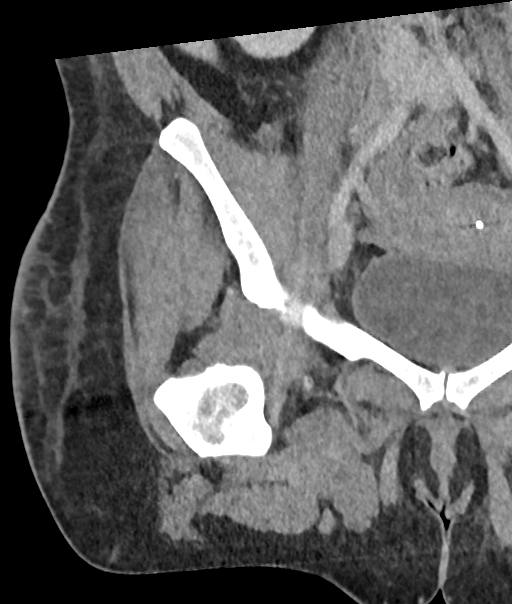
[im 100/134  soft-tissue]
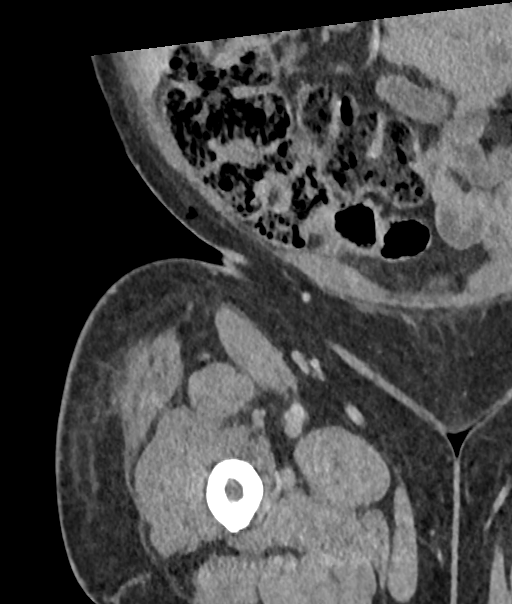

[16 of 46 positions shown; findings below may reference images not displayed]

FINDINGS: Bones/Joint/Cartilage

Known septic arthritis of the right sacroiliac joint with mild
progression of bony erosion on both sides of the joint space. The
joint remains variably narrowed. No erosions seen at the level of
the hip. No visible hip joint effusion.

Ligaments

Suboptimally assessed by CT.

Muscles and Tendons

There is indistinct thickening of the right lower psoas, iliacus,
and the piriformis musculature consistent with myositis. Collections
seen previously are not visualized today.

Soft tissues

No subcutaneous abscess. Nonspecific subcutaneous reticulation
lateral to the right hip that is stable.

On coronal reformats the patient has IUD is seen and known to be
displaced based on prior abdominal CT.
IMPRESSION: 1. Known septic arthritis of the right sacroiliac joint with mild
progression of bony erosion since [DATE] CT.
2. Continued myositis of the right iliopsoas and piriformis, but no
residual collection.
3. No visible hip joint effusion or erosion.
4. Known malpositioning of the IUD.

## 2019-09-02 MED ORDER — LORAZEPAM 2 MG/ML IJ SOLN
1.0000 mg | Freq: Once | INTRAMUSCULAR | Status: AC | PRN
  Administered 2019-09-02: 19:00:00 1 mg via INTRAVENOUS
  Filled 2019-09-02: qty 1

## 2019-09-02 MED ORDER — FENTANYL CITRATE (PF) 100 MCG/2ML IJ SOLN
12.5000 ug | INTRAMUSCULAR | Status: DC | PRN
  Administered 2019-09-02 – 2019-09-03 (×5): 12.5 ug via INTRAVENOUS
  Filled 2019-09-02 (×6): qty 2

## 2019-09-02 MED ORDER — ALTEPLASE 2 MG IJ SOLR
2.0000 mg | Freq: Once | INTRAMUSCULAR | Status: AC
  Administered 2019-09-02: 2 mg

## 2019-09-02 MED ORDER — IOHEXOL 300 MG/ML  SOLN
100.0000 mL | Freq: Once | INTRAMUSCULAR | Status: AC | PRN
  Administered 2019-09-02: 19:00:00 100 mL via INTRAVENOUS

## 2019-09-02 MED ORDER — OXYCODONE HCL 5 MG PO TABS
5.0000 mg | ORAL_TABLET | ORAL | Status: AC | PRN
  Administered 2019-09-02 – 2019-09-03 (×4): 10 mg via ORAL
  Filled 2019-09-02 (×4): qty 2

## 2019-09-02 NOTE — Plan of Care (Signed)
  Problem: Clinical Measurements: Goal: Ability to maintain clinical measurements within normal limits will improve Outcome: Progressing   Problem: Clinical Measurements: Goal: Respiratory complications will improve Outcome: Completed/Met

## 2019-09-02 NOTE — Progress Notes (Signed)
PT with complaint of low grade temp of 99, increased pain in hip despite pain medication. Paged provider to inform. Per provider to give pt po tylenol for temp, CT was ordered for hip, and new labs were ordered for pt. Will continue to monitor pt.

## 2019-09-02 NOTE — Progress Notes (Signed)
Pharmacy Antibiotic Note  Leslie Molina is a 34 y.o. female admitted on 08/03/2019 with MRSA bacteremia/abscess w/ osteo of saacrum and ischium. Patient continuing on vancomycin for disseminated MRSA infection. ID following and plans are for a total of 6 weeks antibiotics (end date 09/13/19)  Vancomycin levels ordered for today.   Plan: Continue vancomycin 750mg  IV q8h  Will check surveillance vancomycin levels weekly SCr stable Patient in pain, noted  Height: 5\' 1"  (154.9 cm) Weight: 69.9 kg (154 lb) IBW/kg (Calculated) : 47.8  Temp (24hrs), Avg:98.2 F (36.8 C), Min:98.2 F (36.8 C), Max:98.3 F (36.8 C)  Recent Labs  Lab 08/27/19 0607 08/27/19 2000 08/28/19 0416 08/30/19 0703  WBC  --   --  5.2  --   CREATININE  --   --  0.63 0.57  VANCOTROUGH  --  9*  --   --   VANCOPEAK 28*  --   --   --      Antimicrobials this admission: 3/3 metronidazole x 1 3/3 vancomycin>>3/11 3/3 cefepime>>3/5 3/4 clindamycin >>3/5 3/11 Ceftaroline>>3/15 3/4 daptomycin 500mg  x 1; resume 3/11 >> 3/15  3/15 vancomycin >>  Microbiology results: 3/4 repeats: staph aureus 3/3BCx:GPC clusters 4/4 bottles (BCID = MRSA) 3/3 iliopsoas abscess:  MRSA  3/6 blood x2- neg 3/10 pleural fluid- staph aureus 3/12 wound culture - staph aureus   Harvel Quale 09/02/2019 9:37 AM

## 2019-09-02 NOTE — Progress Notes (Signed)
Pharmacy Antibiotic Note  Leslie Molina is a 34 y.o. female admitted on 08/03/2019 with MRSA bacteremia/abscess w/ osteo of saacrum and ischium. Patient continuing on vancomycin for disseminated MRSA infection. ID following and plans are for a total of 6 weeks antibiotics (end date 09/13/19)  Vancomycin levels ordered for today demonstrate an AUC of 414 mcg*h/ml which is within goal range of 400-550.   Plan: Continue vancomycin 750mg  IV q8h  Will check surveillance vancomycin levels weekly  Height: 5\' 1"  (154.9 cm) Weight: 69.9 kg (154 lb) IBW/kg (Calculated) : 47.8  Temp (24hrs), Avg:98.9 F (37.2 C), Min:98.3 F (36.8 C), Max:99.4 F (37.4 C)  Recent Labs  Lab 08/27/19 0607 08/27/19 2000 08/28/19 0416 08/30/19 0703 09/02/19 1506 09/02/19 2044  WBC  --   --  5.2  --  8.0  --   CREATININE  --   --  0.63 0.57  --   --   VANCOTROUGH  --  9*  --   --   --  10*  VANCOPEAK 28*  --   --   --  22*  --      Antimicrobials this admission: 3/3 metronidazole x 1 3/3 vancomycin>>3/11 3/3 cefepime>>3/5 3/4 clindamycin >>3/5 3/11 Ceftaroline>>3/15 3/4 daptomycin 500mg  x 1; resume 3/11 >> 3/15  3/15 vancomycin >>  Microbiology results: 3/4 repeats: staph aureus 3/3BCx:GPC clusters 4/4 bottles (BCID = MRSA) 3/3 iliopsoas abscess:  MRSA  3/6 blood x2- neg 3/10 pleural fluid- staph aureus 3/12 wound culture - staph aureus   Arrie Senate, PharmD, BCPS Clinical Pharmacist [phone] Please check AMION for all [facility] numbers 09/02/2019

## 2019-09-02 NOTE — Progress Notes (Signed)
PROGRESS NOTE  Leslie Molina OQH:[id number] DOB: 02/20/1986   PCP: [facility] [place]  Patient is from: Home.  DOA: 08/03/2019 LOS: 30  Brief Narrative / Interim history: 34 year old female with history of anxiety/depression/bipolar disorder/IVDU presented to ED with back pain, RLE pain and fever and found to have extensive right iliopsoas abscess extending to proximal right thigh with extensive myositis, osteomyelitis of the right sacrum and iliac bone as noted on MRI.  Patient was initially admitted to ICU at [facility] on 3/4 for septic shock.   Patient has had needle aspiration and drain placement. Blood and pelvic abscess grew MRSA.  She has been on IV vancomycin since 08/03/2019.    CT of the neck on 3/7/2021showed multiple peripheral predominant nodules at the lung apices suspicious for septic emboli and a right empyemawith inflammation and stranding on the right retroclavicular region and right lower neck consistent with soft tissue infection.She was stabilized and transferred to hospitalist service on 3/8.CTS consulted and recommended IR eval.  IR performed US-guided thoracentesis on3/03/2020, aspirated 14 mL of purulent material. Pleural culture grew MRSA.  CT of the abdomen and pelvis 3/11/2021showed continued significant enlargement of right ileo psoas muscle withmultiple irregularand peripherally enhancing low densities throughout its course, most consistent with abscesses. IR was reconsulted and performed gluteal abscess and right iliopsoas abscessneedle aspiration on 08/11/2019.    Patient will be completing IV vancomycin course through 09/13/2019 in house.  Then, she will be discharged on doxycycline for 14 more days.    Patient was transitioned to Suboxone on 09/01/2019 but continued to have significant withdrawal symptoms.  Suboxone discontinued.  Restarted on oxycodone and as needed fentanyl.   Subjective: Seen and examined earlier this morning.  Reports severe  right-sided hip pain.  Pain worse with ambulation.  Denies chest pain, dyspnea, GI or UTI symptoms.  Objective: Vitals:   09/01/19 0622 09/01/19 1740 09/01/19 2047 09/02/19 0632  BP: 93/61 93/71 110/69 (!) 97/57  Pulse: 92 (!) 107 (!) 104 (!) 120  Resp: 18 18 18 18   Temp: 98.2 F (36.8 C) 98.2 F (36.8 C) 98.2 F (36.8 C) 98.3 F (36.8 C)  TempSrc: Oral Oral Oral Oral  SpO2: 96% 97% 98% 97%  Weight:    69.9 kg  Height:        Intake/Output Summary (Last 24 hours) at 09/02/2019 1109 Last data filed at 09/01/2019 1900 Gross per 24 hour  Intake 1055.65 ml  Output --  Net 1055.65 ml   Filed Weights   08/30/19 0630 08/31/19 0649 09/02/19 5465  Weight: 69.3 kg 68.8 kg 69.9 kg    Examination:  GENERAL: Restless and crying from pain. HEENT: MMM.  Vision and hearing grossly intact.  NECK: Supple.  No apparent JVD.  RESP: On room air.  No IWOB. Good air movement bilaterally. CVS:  RRR. Heart sounds normal.  ABD/GI/GU: Bowel sounds present. Soft. Non tender.  MSK/EXT:  Moves extremities. No apparent deformity.  Nonfocal tenderness over right hip and thigh.  No overlying skin erythema. SKIN: no apparent skin lesion or wound NEURO: Awake, alert and oriented appropriately.  No apparent focal neuro deficit. PSYCH: Seems to be in distress from pain.  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 doxycycline for 14 days -CRP and ESR prior to discharge -Weekly CBC with differential and biweekly BMP -We will curbside ID once close to discharge. -Resumed oxycodone and as needed fentanyl for pain  control -Repeat CT of right hip if no improvement in pain  Right chest empyema -CTS recommended IR consult.   -IR performed US-guided thora with removal of 13 cc purulent fluid on 3/10 -Pleural fluid culture with MRSA.  Possible peritonitis/IUD perforation-noted on CT abdomen and pelvis -No surgical intervention per GYN and general surgery  Anxiety/depression/bipolar disorder: Stable -Continue home Cymbalta, Seroquel and trazodone  Thrombocytosis: Likely reactive -Continue monitoring  Normocytic iron deficiency anemia: H&H stable.  Iron sat 7%. -IV Feraheme on 4/1.  Will give additional dose in 3 to 4 days. -Monitor intermittently.  Polysubstance abuse/IVDU: Challenging issue.  Significant withdrawal symptoms with Suboxone -Continue as needed oxycodone and fentanyl, and will gradually wean.     Nutrition Problem: Inadequate oral intake Etiology: decreased appetite  Signs/Symptoms: per patient/family report  Interventions: Ensure Enlive (each supplement provides 350kcal and 20 grams of protein)   DVT prophylaxis: Subcu Lovenox Code Status: Full code Family Communication: Updated patient's mother at bedside.  Discharge barrier: IV antibiotics from disseminated MRSA bacteremia.  Not a candidate for home IV therapy due to IVDU Patient is from: Home Final disposition: Home on 09/13/2019  Consultants:   Infectious disease  Interventional Radiology  CTS  Orthopedic surgery  OB/Gyn  General Surgery  PCCM  Microbiology summarized: 3/4-blood cultures with MRSA 3/6-blood cultures negative 3/10-pleural fluid with MRSA 3/12-right gluteal abscess culture with MRSA 3/18-urine cultures negative  Sch Meds:  Scheduled Meds: . Chlorhexidine Gluconate Cloth  6 each Topical Daily  . docusate sodium  100 mg Oral BID  . DULoxetine  30 mg Oral Daily  . enoxaparin (LOVENOX) injection  40 mg Subcutaneous QHS  . feeding supplement (ENSURE ENLIVE)  237 mL Oral TID BM  .  gabapentin  300 mg Oral TID  . QUEtiapine  150 mg Oral QHS  . sodium chloride flush  10-40 mL Intracatheter Q12H  . traZODone  150 mg Oral QHS   Continuous Infusions: . sodium chloride 250 mL (09/01/19 1353)  . vancomycin 750 mg (09/02/19 0428)   PRN Meds:.sodium chloride, acetaminophen, fentaNYL (SUBLIMAZE) injection, ibuprofen, ondansetron (ZOFRAN) IV, oxyCODONE, polyethylene glycol, senna-docusate, simethicone  Antimicrobials: Anti-infectives (From admission, onward)   Start     Dose/Rate Route Frequency Ordered Stop   08/24/19 2000  vancomycin (VANCOREADY) IVPB 750 mg/150 mL     750 mg 150 mL/hr over 60 Minutes Intravenous Every 8 hours 08/24/19 1506     08/16/19 2000  vancomycin (VANCOCIN) IVPB 1000 mg/200 mL premix  Status:  Discontinued     1,000 mg 200 mL/hr over 60 Minutes Intravenous Every 8 hours 08/16/19 1601 08/24/19 1506   08/15/19 0400  vancomycin (VANCOREADY) IVPB 1250 mg/250 mL  Status:  Discontinued     1,250 mg 166.7 mL/hr over 90 Minutes Intravenous Every 8 hours 08/14/19 1132 08/16/19 1601   08/14/19 2000  vancomycin (VANCOREADY) IVPB 1500 mg/300 mL     1,500 mg 150 mL/hr over 120 Minutes Intravenous  Once 08/14/19 1132 08/14/19 2217   08/11/19 0900  ceftaroline (TEFLARO) 600 mg in sodium chloride 0.9 % 250 mL IVPB  Status:  Discontinued     600 mg 250 mL/hr over 60 Minutes Intravenous Every 8 hours 08/11/19 0859 08/14/19 1109  08/10/19 2200  linezolid (ZYVOX) IVPB 600 mg  Status:  Discontinued     600 mg 300 mL/hr over 60 Minutes Intravenous Every 12 hours 08/10/19 1732 08/10/19 1732   08/10/19 2200  ceftaroline (TEFLARO) 600 mg in sodium chloride 0.9 % 250 mL IVPB  Status:  Discontinued     600 mg 250 mL/hr over 60 Minutes Intravenous Every 12 hours 08/10/19 1733 08/11/19 0859   08/10/19 1830  DAPTOmycin (CUBICIN) 500 mg in sodium chloride 0.9 % IVPB  Status:  Discontinued     500 mg 220 mL/hr over 30 Minutes Intravenous Daily 08/10/19 1801 08/14/19 1109     08/07/19 1800  vancomycin (VANCOREADY) IVPB 1500 mg/300 mL  Status:  Discontinued     1,500 mg 150 mL/hr over 120 Minutes Intravenous Every 8 hours 08/07/19 1344 08/10/19 1732   08/06/19 2300  vancomycin (VANCOREADY) IVPB 2000 mg/400 mL  Status:  Discontinued     2,000 mg 200 mL/hr over 120 Minutes Intravenous Every 8 hours 08/06/19 2230 08/07/19 1344   08/05/19 1400  vancomycin (VANCOCIN) IVPB 1000 mg/200 mL premix  Status:  Discontinued     1,000 mg 200 mL/hr over 60 Minutes Intravenous Every 8 hours 08/05/19 0815 08/06/19 2229   08/03/19 2200  ceFEPIme (MAXIPIME) 2 g in sodium chloride 0.9 % 100 mL IVPB  Status:  Discontinued     2 g 200 mL/hr over 30 Minutes Intravenous Every 8 hours 08/03/19 1851 08/04/19 1027   08/03/19 2100  vancomycin (VANCOREADY) IVPB 750 mg/150 mL  Status:  Discontinued     750 mg 150 mL/hr over 60 Minutes Intravenous Every 8 hours 08/03/19 1851 08/05/19 0815   08/03/19 2000  clindamycin (CLEOCIN) IVPB 900 mg  Status:  Discontinued     900 mg 100 mL/hr over 30 Minutes Intravenous Every 8 hours 08/03/19 1851 08/04/19 1027       I have personally reviewed the following labs and images: CBC: Recent Labs  Lab 08/28/19 0416  WBC 5.2  NEUTROABS 2.9  HGB 9.5*  HCT 29.9*  MCV 85.2  PLT 388   BMP &GFR Recent Labs  Lab 08/28/19 0416 08/30/19 0703  NA 139 136  K 4.4 4.1  CL 100 99  CO2 28 25  GLUCOSE 113* 101*  BUN 8 8  CREATININE 0.63 0.57  CALCIUM 8.9 8.8*   Estimated Creatinine Clearance: 88.5 mL/min (by C-G formula based on SCr of 0.57 mg/dL). Liver & Pancreas: No results for input(s): AST, ALT, ALKPHOS, BILITOT, PROT, ALBUMIN in the last 168 hours. No results for input(s): LIPASE, AMYLASE in the last 168 hours. No results for input(s): AMMONIA in the last 168 hours. Diabetic: No results for input(s): HGBA1C in the last 72 hours. No results for input(s): GLUCAP in the last 168 hours. Cardiac Enzymes: No results for input(s): CKTOTAL,  CKMB, CKMBINDEX, TROPONINI in the last 168 hours. No results for input(s): PROBNP in the last 8760 hours. Coagulation Profile: No results for input(s): INR, PROTIME in the last 168 hours. Thyroid Function Tests: No results for input(s): TSH, T4TOTAL, FREET4, T3FREE, THYROIDAB in the last 72 hours. Lipid Profile: No results for input(s): CHOL, HDL, LDLCALC, TRIG, CHOLHDL, LDLDIRECT in the last 72 hours. Anemia Panel: Recent Labs    08/31/19 1215 08/31/19 1220 08/31/19 1225  VITAMINB12  --   --  435  FOLATE 11.8  --   --   FERRITIN  --   --  62  TIBC  --   --  343  IRON  --   --  25*  RETICCTPCT  --  3.2*  --    Urine analysis:    Component Value Date/Time   COLORURINE YELLOW 08/17/2019 1330   APPEARANCEUR CLOUDY (A) 08/17/2019 1330   APPEARANCEUR Clear 04/03/2014 0059   LABSPEC 1.011 08/17/2019 1330   LABSPEC 1.016 04/03/2014 0059   PHURINE 7.0 08/17/2019 1330   GLUCOSEU NEGATIVE 08/17/2019 1330   GLUCOSEU Negative 04/03/2014 0059   HGBUR NEGATIVE 08/17/2019 1330   BILIRUBINUR NEGATIVE 08/17/2019 1330   BILIRUBINUR Negative 04/03/2014 0059   KETONESUR NEGATIVE 08/17/2019 1330   PROTEINUR NEGATIVE 08/17/2019 1330   NITRITE NEGATIVE 08/17/2019 1330   LEUKOCYTESUR LARGE (A) 08/17/2019 1330   LEUKOCYTESUR Negative 04/03/2014 0059   Sepsis Labs: Invalid input(s): PROCALCITONIN, [place]  Microbiology: No results found for this or any previous visit (from the past 240 hour(s)).  Radiology Studies: No results found.    Naser Schuld T. Alma  If 7PM-7AM, please contact night-coverage [email or web address] Password [facility] 09/02/2019, 11:09 AM

## 2019-09-03 MED ORDER — DOXYCYCLINE MONOHYDRATE 100 MG PO TABS: 100.0000 mg | ORAL_TABLET | Freq: Two times a day (BID) | ORAL | 0 refills | Status: DC

## 2019-09-03 MED ORDER — SODIUM CHLORIDE 0.9 % IV SOLN
510.0000 mg | Freq: Once | INTRAVENOUS | Status: DC
  Filled 2019-09-03: qty 17

## 2019-09-03 NOTE — Discharge Summary (Signed)
Physician Discharge Summary  Leslie Molina PYK:[id number] DOB: 05/29/1986 DOA: 08/03/2019  PCP: [facility] [place] date: 08/03/2019 Discharge date: 09/03/2019  Admitted From: Home. Disposition: Patient left AGAINST MEDICAL ADVICE.  Discharge Condition: Stable CODE STATUS: Full code  Follow-up [facility] for Infectious Disease Follow up on 10/03/2019.   Specialty: Infectious Diseases Contact information: [place], [place] [id number] [place] [phone]         Hospital Course: 34 year old female with history of anxiety/depression/bipolar disorder/IVDU presented to ED with back pain, RLE pain and fever and found to have extensive right iliopsoas abscess extending to proximal right thigh with extensive myositis, osteomyelitis of the right sacrum and iliac bone as noted on MRI.  Patient was initially admitted to ICU at [facility] on 3/4 for septic shock.   Patient has had needle aspiration and drain placement. Blood and pelvic abscess grew MRSA.  She has been on IV vancomycin since 08/03/2019.    CT of the neck on 3/7/2021showed multiple peripheral predominant nodules at the lung apices suspicious for septic emboli and a right empyemawith inflammation and stranding on the right retroclavicular region and right lower neck consistent with soft tissue infection.She was stabilized and transferred to hospitalist service on 3/8.CTS consulted and recommended IR eval.  IR performed US-guided thoracentesis on3/03/2020, aspirated 14 mL of purulent material. Pleural culture grew MRSA.  CT of the abdomen and pelvis 3/11/2021showed continued significant enlargement of right ileo psoas muscle withmultiple irregularand peripherally enhancing low densities throughout its course, most consistent with abscesses. IR was reconsulted and performed gluteal abscess and right iliopsoas abscessneedle aspiration on  08/11/2019.    Patient will be completing IV vancomycin course through 09/13/2019 in house.  Then, she will be discharged on doxycycline for 14 more days.    Patient was transitioned to Suboxone on 09/01/2019 but continued to have significant withdrawal symptoms.  Suboxone discontinued.  Restarted on oxycodone and as needed fentanyl.   Patient had worsening right hip pain.  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  Patient had no fever or leukocytosis.   On 4/4, patient decided to leave [facility] stating "I am sick of this hospital.  Everyone he is good at me.  I will go to another hospital if I get sick".  Patient has capacity to make medical decisions although she is making poor decisions.  PICC line removed.  I provided her with paper prescription for doxycycline 100 mg twice daily, #56.  Of note, repeat UDS positive for benzodiazepines although she hasn't been given any benzodiazepines for over 10 days prior to urine collection.    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 doxycycline for 14 days -CRP and ESR prior to discharge -Weekly CBC with differential and biweekly BMP -We will curbside ID once close to discharge. -Resumed oxycodone and as needed fentanyl for pain control -Repeat CT of right hip as above. -Patient left AGAINST MEDICAL ADVICE.  She was given paper prescription for doxycycline 100 mg twice daily, #56.  Right chest empyema -CTS recommended IR consult.   -  IR performed US-guided thora with removal of 13 cc purulent fluid on  3/10 -Pleural fluid culture with MRSA.  Possible peritonitis/IUD perforation-noted on CT abdomen and pelvis -No surgical intervention per GYN and general surgery  Anxiety/depression/bipolar disorder: Stable -Continue home Cymbalta, Seroquel and trazodone  Thrombocytosis: Likely reactive -Continue monitoring  Normocytic iron deficiency anemia: H&H stable.  Iron sat 7%. -IV Feraheme on 4/1.  -Monitor intermittently.  Polysubstance abuse/IVDU: Challenging issue.  Significant withdrawal symptoms with Suboxone -Continue as needed oxycodone and fentanyl, and will gradually wean. -Repeat UDS on 4/3 positive for benzodiazepines although she did not receive any for over 10 days prior to urine collection.   Nutrition Problem: Inadequate oral intake Etiology: decreased appetite  Signs/Symptoms: per patient/family report  Interventions: Ensure Enlive (each supplement provides 350kcal and 20 grams of protein)    Discharge Instructions   Allergies as of 09/03/2019   No Known Allergies     Medication List    STOP taking these medications   acetaminophen 10 MG/ML Soln Commonly known as: OFIRMEV   acetaminophen 325 MG tablet Commonly known as: TYLENOL   albumin human 25 % bottle   ceFEPIme 2 g in sodium chloride 0.9 % 100 mL   Chlorhexidine Gluconate Cloth 2 % Pads   clindamycin 900 MG/50ML IVPB Commonly known as: CLEOCIN   DAPTOmycin 500 mg in sodium chloride 0.9 % 100 mL   dextrose 5 % and 0.45% NaCl infusion   enoxaparin 40 MG/0.4ML injection Commonly known as: LOVENOX   ibuprofen 200 MG tablet Commonly known as: ADVIL   norepinephrine 4-5 MG/250ML-% Soln Commonly known as: LEVOPHED   oxyCODONE 5 MG immediate release tablet Commonly known as: Oxy IR/ROXICODONE   potassium chloride 10 MEQ/100ML   vancomycin 500 MG/100ML IVPB Commonly known as: VANCOREADY   vancomycin 750 MG/150ML Soln Commonly known as: VANCOREADY     TAKE these medications    doxycycline 100 MG tablet Commonly known as: ADOXA Take 1 tablet (100 mg total) by mouth 2 (two) times daily.       Consultations:  Infectious disease  Interventional Radiology  CTS  Orthopedic surgery  OB/Gyn  General Surgery  PCCM   Procedures/Studies:  Drain placed for gluteal abscess after needle aspiration  IR Thoracentesis  Triple lumen catheter placement  IR drainage of abscess in iliacus   DG Chest 1 View  Result Date: 08/09/2019 CLINICAL DATA:  Status post right thoracentesis EXAM: CHEST  1 VIEW COMPARISON:  08/03/2019 FINDINGS: Cardiac shadow is within normal limits. The lungs are well aerated. No pneumothorax is noted following right thoracentesis. No sizable effusion is seen. No bony abnormality is noted. IMPRESSION: No evidence of pneumothorax. Electronically Signed   By: Inez Catalina M.D.   On: 08/09/2019 17:14   CT SOFT TISSUE NECK W CONTRAST  Result Date: 08/06/2019 CLINICAL DATA:  Infection, soft tissue infection, abscess; soft tissue mass, neck, no prior imaging. Additional history provided: Swelling, pain, redness to right side of body where the neck meets the shoulder. EXAM: CT NECK WITH CONTRAST TECHNIQUE: Multidetector CT imaging of the neck was performed using the standard protocol following the bolus administration of intravenous contrast. CONTRAST:  50m OMNIPAQUE IOHEXOL 350 MG/ML SOLN COMPARISON:  CT angiogram chest 08/02/2019 FINDINGS: Pharynx and larynx: No appreciable mass or swelling within the oral cavity, pharynx or larynx. Salivary glands: No inflammation, mass, or stone. Thyroid: Unremarkable thyroid gland. Lymph nodes: No pathologically enlarged cervical chain lymph nodes. Vascular: The major vascular structures of the neck appear patent.  Limited intracranial: No acute intracranial abnormality identified Visualized orbits: Visualized orbits demonstrate no acute abnormality. Mastoids and visualized paranasal sinuses: The bilateral  maxillary sinuses demonstrate mild mucosal thickening and reactive osteitis. No significant mastoid effusion. Skeleton: No acute bony abnormality or aggressive osseous lesion. C5-C6 disc bulge. Upper chest: New from prior CTA chest 08/02/2019, there are numerous peripheral predominant pulmonary nodules within the imaged lung apices suspicious for septic emboli. There is a loculated collection within the right pleural space along the lateral aspect of the right lung apex measuring 8.5 x 2.7 cm in transaxial dimensions. There is a punctate focus of gas within this collection. Findings likely reflect empyema. In retrospect, this finding was present on prior examination 08/02/2019, but has increased in size since this prior study. No definite erosion of the adjacent ribs. There is extension of low-density edema into the adjacent right pectoralis musculature measuring 3.9 x 2.2 cm in transaxial dimensions (for instance as seen series 3, image 104). Associated inflammatory stranding within the right retroclavicular region and lower right neck. Partially visualized left IJ approach central venous catheter. These results will be called to the ordering clinician or representative by the Radiologist Assistant, and communication documented in the PACS or zVision Dashboard. IMPRESSION: 1. Multiple peripheral predominant nodules within the imaged lung apices, new as compared to prior examination 08/02/2019 and highly suspicious for septic emboli. 2. Right pleural collection containing a punctate focus of gas along the lateral aspect of the right lung apex likely reflecting empyema. Extension of low attenuation edema into the adjacent right pectoralis musculature. No peripheral enhancement is demonstrated at this site on the current examination to suggest formed abscess. Associated inflammatory stranding within the right retroclavicular region and right lower neck consistent with soft tissue infection. Dedicated chest CT is  recommended for further evaluation. Electronically Signed   By: Kellie Simmering DO   On: 08/06/2019 18:39   CT CHEST WO CONTRAST  Result Date: 08/08/2019 CLINICAL DATA:  Empyema. Sepsis secondary to MRSA bacteremia. Leukocytosis, tachycardia and tachypnea. EXAM: CT CHEST WITHOUT CONTRAST TECHNIQUE: Multidetector CT imaging of the chest was performed following the standard protocol without IV contrast. COMPARISON:  Chest radiograph 08/03/2019, chest CT 08/02/2019 FINDINGS: Cardiovascular: Left upper extremity PICC in place. No aortic aneurysm. Heart is normal in size. Small pericardial effusion measuring 12 mm in depth inferiorly. Mediastinum/Nodes: Small mediastinal lymph nodes not enlarged by size criteria. Limited assessment for hilar adenopathy given lack of IV contrast. No esophageal wall thickening. Lungs/Pleura: Dependent consolidation in right lower lobe has air bronchograms, suspicious for pneumonia. Minimal dependent right pleural thickening without frank effusion. Loculated pleural fluid at the anterior right upper lobe measures up to 3 cm, increased since prior. Multiple small pulmonary nodules and nodular densities are new from prior exam, likely infectious. No cavitary lesions. Streaky opacities in the dependent left lower lobe, likely atelectasis. Upper Abdomen: No acute findings on noncontrast exam. Moderate volume of stool in the included colon. Musculoskeletal: Soft tissue thickening involving the right first and second intercostal muscles again seen, less well-defined currently in the absence of IV contrast. There is soft tissue edema in the adjacent right axilla, no subcutaneous fluid collection. No periosteal reaction or bony destructive change. IMPRESSION: 1. Small loculated pleural effusion at the right lung apex adjacent to thickening of the intercostal muscles, slightly increased in size from CT 6 days ago. 2. Scattered nodular densities throughout both lungs, also new, likely infectious. No  definite cavitary lesions. Dependent right lower lobe consolidation consistent with  pneumonia. Minimal right pleural thickening without sub pulmonic effusion. 3. Small pericardial effusion. Electronically Signed   By: Keith Rake M.D.   On: 08/08/2019 19:14   CT CHEST W CONTRAST  Result Date: 08/10/2019 CLINICAL DATA:  Right ileo psoas abscess.  Empyema. EXAM: CT CHEST, ABDOMEN, AND PELVIS WITH CONTRAST TECHNIQUE: Multidetector CT imaging of the chest, abdomen and pelvis was performed following the standard protocol during bolus administration of intravenous contrast. CONTRAST:  181m OMNIPAQUE IOHEXOL 300 MG/ML  SOLN 80 mL OMNIPAQUE IOHEXOL 300 MG/ML  SOLN COMPARISON:  None. August 02, 2019.  August 08, 2019. FINDINGS: CT ABDOMEN PELVIS FINDINGS Hepatobiliary: No focal liver abnormality is seen. No gallstones, gallbladder wall thickening, or biliary dilatation. Pancreas: Unremarkable. No pancreatic ductal dilatation or surrounding inflammatory changes. Spleen: Normal in size without focal abnormality. Adrenals/Urinary Tract: Adrenal glands are unremarkable. Kidneys are normal, without renal calculi, focal lesion, or hydronephrosis. Bladder is unremarkable. Stomach/Bowel: Stomach is within normal limits. Appendix appears normal. No evidence of bowel wall thickening, distention, or inflammatory changes. Vascular/Lymphatic: No significant vascular findings are present. No enlarged abdominal or pelvic lymph nodes. Reproductive: Intrauterine device is noted. No adnexal abnormality is noted. Other: No abdominal wall hernia or abnormality. No abdominopelvic ascites. Musculoskeletal: There is significant enlargement of the right ileo psoas muscle with multiple irregular peripherally enhancing low densities throughout its course concerning for multiple abscesses. This is seen as far distally as its insertion with the proximal femur. There is also noted a 3.0 x 2.4 cm fluid collection seen in the gluteus medius muscle  on the right. 20 x 15 mm fluid collection is noted in right iliacus muscle. Chest: Cardiovascular: No significant vascular findings. Normal heart size. Small pericardial effusion. Mediastinum/Nodes: No enlarged mediastinal, hilar, or axillary lymph nodes. Thyroid gland, trachea, and esophagus demonstrate no significant findings. Lungs/Pleura: Grossly stable appearance of probable small loculated pleural effusion seen along the lateral and anterior aspect of the right upper lobe. It currently measures 8.6 x 2.7 cm. No pneumothorax is noted. Right posterior atelectasis or infiltrate is noted. Mild left basilar atelectasis is noted. Stable small bilateral lung opacities are noted, most likely infectious in etiology. IMPRESSION: Grossly stable loculated effusion or empyema is seen involving the anterior and lateral portion of the right upper lobe. Grossly stable small bilateral lung opacities are noted, most likely infectious in etiology. Right posterior atelectasis or infiltrate is noted. Continued significant enlargement of right ileo psoas muscle with multiple irregular and peripherally enhancing low densities throughout its course, most consistent with abscesses. Probable abscesses are also noted in the right iliacus muscle as well as in the right gluteus medius muscle. Electronically Signed   By: JMarijo ConceptionM.D.   On: 08/10/2019 13:28   CT ABDOMEN PELVIS W CONTRAST  Result Date: 08/23/2019 CLINICAL DATA:  Psoas muscle abscess, post drain placement 08/11/2019 EXAM: CT ABDOMEN AND PELVIS WITH CONTRAST TECHNIQUE: Multidetector CT imaging of the abdomen and pelvis was performed using the standard protocol following bolus administration of intravenous contrast. CONTRAST:  1043mOMNIPAQUE IOHEXOL 300 MG/ML  SOLN COMPARISON:  08/10/2019 FINDINGS: Lower chest: Platelike scarring or atelectasis at the right lung base, slightly improved. No pleural or pericardial effusion. Hepatobiliary: No focal liver abnormality  is seen. No gallstones, gallbladder wall thickening, or biliary dilatation. Pancreas: Unremarkable. No pancreatic ductal dilatation or surrounding inflammatory changes. Spleen: Normal in size without focal abnormality. Adrenals/Urinary Tract: Adrenals unremarkable. Kidneys enhance normally without hydronephrosis. Urinary bladder incompletely distended. Stomach/Bowel: Stomach is partially distended by  ingested material. Small bowel is nondilated. Moderate colonic fecal material without dilatation or other acute finding. Vascular/Lymphatic: No significant vascular findings are present. No enlarged abdominal or pelvic lymph nodes. Reproductive: IUD in place, right limb extending beyond the margin of the uterus. No adnexal mass. Other: No ascites.  No free air. Stable right retroperitoneal pigtail drain catheter. Significant interval improvement in multiloculated right iliopsoas and gluteal fluid collections. 1.7 cm small intramuscular residual pocket in the inferior right psoas, previously 3 cm. 6.7 cm linear residual collection in the deep gluteal musculature. Decrease in overall thickening of the right iliacus musculature. Musculoskeletal: No fracture or worrisome bone lesion. No CT suggestion of discitis. IMPRESSION: 1. Significant improvement in right iliopsoas and gluteal multiloculated abscess post percutaneous drainage. Scattered small residual collections, too small for drain catheter. Electronically Signed   By: Lucrezia Europe M.D.   On: 08/23/2019 17:17   CT ABDOMEN PELVIS W CONTRAST  Result Date: 08/10/2019 CLINICAL DATA:  Right ileo psoas abscess.  Empyema. EXAM: CT CHEST, ABDOMEN, AND PELVIS WITH CONTRAST TECHNIQUE: Multidetector CT imaging of the chest, abdomen and pelvis was performed following the standard protocol during bolus administration of intravenous contrast. CONTRAST:  173m OMNIPAQUE IOHEXOL 300 MG/ML  SOLN 80 mL OMNIPAQUE IOHEXOL 300 MG/ML  SOLN COMPARISON:  None. August 02, 2019.  August 08, 2019. FINDINGS: CT ABDOMEN PELVIS FINDINGS Hepatobiliary: No focal liver abnormality is seen. No gallstones, gallbladder wall thickening, or biliary dilatation. Pancreas: Unremarkable. No pancreatic ductal dilatation or surrounding inflammatory changes. Spleen: Normal in size without focal abnormality. Adrenals/Urinary Tract: Adrenal glands are unremarkable. Kidneys are normal, without renal calculi, focal lesion, or hydronephrosis. Bladder is unremarkable. Stomach/Bowel: Stomach is within normal limits. Appendix appears normal. No evidence of bowel wall thickening, distention, or inflammatory changes. Vascular/Lymphatic: No significant vascular findings are present. No enlarged abdominal or pelvic lymph nodes. Reproductive: Intrauterine device is noted. No adnexal abnormality is noted. Other: No abdominal wall hernia or abnormality. No abdominopelvic ascites. Musculoskeletal: There is significant enlargement of the right ileo psoas muscle with multiple irregular peripherally enhancing low densities throughout its course concerning for multiple abscesses. This is seen as far distally as its insertion with the proximal femur. There is also noted a 3.0 x 2.4 cm fluid collection seen in the gluteus medius muscle on the right. 20 x 15 mm fluid collection is noted in right iliacus muscle. Chest: Cardiovascular: No significant vascular findings. Normal heart size. Small pericardial effusion. Mediastinum/Nodes: No enlarged mediastinal, hilar, or axillary lymph nodes. Thyroid gland, trachea, and esophagus demonstrate no significant findings. Lungs/Pleura: Grossly stable appearance of probable small loculated pleural effusion seen along the lateral and anterior aspect of the right upper lobe. It currently measures 8.6 x 2.7 cm. No pneumothorax is noted. Right posterior atelectasis or infiltrate is noted. Mild left basilar atelectasis is noted. Stable small bilateral lung opacities are noted, most likely infectious in  etiology. IMPRESSION: Grossly stable loculated effusion or empyema is seen involving the anterior and lateral portion of the right upper lobe. Grossly stable small bilateral lung opacities are noted, most likely infectious in etiology. Right posterior atelectasis or infiltrate is noted. Continued significant enlargement of right ileo psoas muscle with multiple irregular and peripherally enhancing low densities throughout its course, most consistent with abscesses. Probable abscesses are also noted in the right iliacus muscle as well as in the right gluteus medius muscle. Electronically Signed   By: JMarijo ConceptionM.D.   On: 08/10/2019 13:28   MR HAND RIGHT  WO CONTRAST  Result Date: 08/09/2019 CLINICAL DATA:  Hand pain swelling. EXAM: MRI OF THE RIGHT HAND WITHOUT CONTRAST TECHNIQUE: Multiplanar, multisequence MR imaging of the right hand was performed. No intravenous contrast was administered. COMPARISON:  Radiographs 08/03/2019 FINDINGS: Focal inflammatory changes around the third MCP joint with soft tissue swelling/edema mainly around the dorsum of the hand. Somewhat ill-defined fluid collection measures 14 mm and is superficial to the extensor tendon. The does appear to be some inflammatory tenosynovitis also. A small joint effusion is noted but I do not see any definite MR findings for septic arthritis or osteomyelitis. The flexor tendons appear normal. The radial and ulnar collateral ligaments are intact. No findings for diffuse cellulitis or myofasciitis. IMPRESSION: 1. MR focal inflammatory changes around the third MCP joints as discussed above. Possible ill-defined superficial abscess dorsally. 2. No findings for septic arthritis or osteomyelitis. 3. No findings for diffuse cellulitis or myofasciitis. Electronically Signed   By: Marijo Sanes M.D.   On: 08/09/2019 06:21   CT HIP RIGHT W CONTRAST  Result Date: 09/03/2019 CLINICAL DATA:  Septic arthritis suspected. EXAM: CT OF THE LOWER RIGHT EXTREMITY  WITH CONTRAST TECHNIQUE: Multidetector CT imaging of the lower right extremity was performed according to the standard protocol following intravenous contrast administration. 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 The joint remains variably narrowed. No erosions seen at the level of the hip. No visible hip joint effusion. Ligaments Suboptimally assessed by CT. Muscles and Tendons There is indistinct thickening of the right lower psoas, iliacus, and the piriformis musculature consistent with myositis. Collections seen previously are not visualized today. Soft tissues No subcutaneous abscess. Nonspecific subcutaneous reticulation lateral to the right hip that is stable. On coronal reformats the patient has IUD is seen and known to be displaced based on prior abdominal CT. IMPRESSION: 1. 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 2. Continued myositis of the right iliopsoas and piriformis, but no residual collection. 3. No visible hip joint effusion or erosion. 4. Known malpositioning of the IUD. Electronically Signed   By: JMonte FantasiaM.D.   On: 09/03/2019 09:14   MR HIP RIGHT WO CONTRAST  Result Date: 08/16/2019 CLINICAL DATA:  Right iliopsoas abscess, myositis EXAM: MR OF THE RIGHT HIP WITHOUT CONTRAST TECHNIQUE: Multiplanar, multisequence MR imaging was performed. No intravenous contrast was administered. COMPARISON:  CT 08/10/2019. MRI 08/02/2019 FINDINGS: Technical note: Examination is markedly degraded by motion artifact and difficulties with patient positioning. Examination was ordered without and with intravenous contrast. Patient was unable to tolerate any postcontrast imaging. Bones/Joint/Cartilage Complex right  sacroiliac joint effusion with extensive marrow edema within the adjacent right hemi sacrum and right iliac bone (series 3, images 10-20). There is associated confluent low T1 signal at these sites compatible with acute osteomyelitis. Edema extends into the supra-acetabular aspect of the right iliac bone without extending to the subchondral portion of the bone. Overall the degree of osseous involvement has progressed compared to 08/02/2019. Right hip joint space is maintained. No fracture or dislocation. Trace joint effusion, similar in size to the prior study. Mild marrow signal changes along the inferior aspect of the left SI joint is favored degenerative/reactive. Remaining osseous structures appear within normal limits. No fracture or malalignment. Ligaments Unremarkable. Myotendinous  Structures and Soft Tissues Extensive complex fluid collections again seen within the right iliopsoas musculature (series 3, images 18-21; series 6, images 1-6). Fluid collection extends distally along the course of the right iliopsoas myotendinous junction and closely approximates the tendon insertion at the lesser trochanter (series 6, images 21-22). Exact measurements of the collection are difficult to obtain given the complex multiloculated configuration. Approximate length of the collection is 23 cm. Overall volume of the fluid appears to of slightly decreased from prior. Partially visualized drainage catheter with posterior approach. Lobulated fluid collections are also seen extending posterolaterally from the inferior aspect of the right SI joint (series 6, images 1-12) extending into the right gluteus medius and maximus musculature, slightly increased from prior. Extensive myositis within the right gluteal and right piriformis musculature. Mild edema within the proximal adductor musculature without fluid collection. Fluid and edema tracks over the lateral and anterolateral aspect of the visualized proximal thigh with some  edema also evident within the proximal quadriceps musculature. IMPRESSION: 1. 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 The degree of bony involvement has progressed from the comparison study 08/02/2019. 2. Extensive myositis and complex abscesses within the right iliopsoas and right gluteal musculature. The overall size of the iliopsoas abscesses appears slightly decreased from prior. Slight progression in the size of right gluteal intramuscular abscesses. 3. Trace right hip joint effusion, similar to prior. Septic arthritis not excluded. 4. Examination is motion degraded. Patient was unable to tolerate any postcontrast imaging. Best possible images were submitted for interpretation. Electronically Signed   By: Davina Poke D.O.   On: 08/16/2019 11:14   CT ASPIRATION  Result Date: 08/11/2019 INDICATION: IV drug use, complex loculated right psoas abscess and small right gluteal abscess EXAM: CT GUIDED DRAINAGE OF RIGHT PSOAS ABSCESS CT ASPIRATION SMALL RIGHT GLUTEAL ABSCESS MEDICATIONS: The patient is currently admitted to the hospital and receiving intravenous antibiotics. The antibiotics were administered within an appropriate time frame prior to the initiation of the procedure. ANESTHESIA/SEDATION: 2.5 mg IV Versed 200 mcg IV Fentanyl Moderate Sedation Time:  35 MINUTES The patient was continuously monitored during the procedure by the interventional radiology nurse under my direct supervision. COMPLICATIONS: None immediate. TECHNIQUE: Informed written consent was obtained from the patient after a thorough discussion of the procedural risks, benefits and alternatives. All questions were addressed. Maximal Sterile Barrier Technique was utilized including caps, mask, sterile gowns, sterile gloves, sterile drape, hand hygiene and skin antiseptic. A timeout was performed prior to the initiation of the procedure. PROCEDURE: Previous imaging  reviewed. Patient position prone. Noncontrast localization CT performed. The right psoas abscess was localized and marked for a posterior paraspinous approach. The right gluteal abscess was also marked for a posterior approach. Right psoas abscess drain: Under sterile conditions and local anesthesia, an 18 gauge 15 cm access was advanced to the right psoas fluid collection. Needle position confirmed with CT. Syringe aspiration yielded thick purulent fluid. Guidewire inserted followed by tract dilatation insert a 12 French drain. Drain catheter position confirmed with CT. Aspiration yielded a total of 15 cc thick purulent fluid. Culture sent for sample. Catheter secured with Prolene suture. Drainage catheter flushed with saline and connected to external suction bulb. Sterile dressing applied. No immediate complication. Patient tolerated the procedure well. Right gluteal abscess aspiration: In a similar fashion, the right gluteal area was sterilely prepped and draped. Under sterile conditions and local anesthesia, CT guidance utilized to advance an 47  gauge 10 cm access needle into the right gluteal intramuscular fluid collection. Needle position confirmed with CT. Syringe aspiration yielded similar thick purulent fluid approximately 8 cc. Sample sent for culture. Needle removed. No immediate complication. Patient tolerated the procedure well. FINDINGS: CT imaging confirms needle placed in the right psoas abscess for drain placement. CT imaging confirms needle placement in the right gluteal abscess for aspiration. IMPRESSION: Successful CT-guided right psoas abscess drain placement Successful CT-guided right gluteal abscess aspiration. Cultures sent for both sites. Electronically Signed   By: Jerilynn Mages.  Shick M.D.   On: 08/11/2019 10:59   CT IMAGE GUIDED DRAINAGE BY PERCUTANEOUS CATHETER  Result Date: 08/11/2019 INDICATION: IV drug use, complex loculated right psoas abscess and small right gluteal abscess EXAM: CT GUIDED  DRAINAGE OF RIGHT PSOAS ABSCESS CT ASPIRATION SMALL RIGHT GLUTEAL ABSCESS MEDICATIONS: The patient is currently admitted to the hospital and receiving intravenous antibiotics. The antibiotics were administered within an appropriate time frame prior to the initiation of the procedure. ANESTHESIA/SEDATION: 2.5 mg IV Versed 200 mcg IV Fentanyl Moderate Sedation Time:  35 MINUTES The patient was continuously monitored during the procedure by the interventional radiology nurse under my direct supervision. COMPLICATIONS: None immediate. TECHNIQUE: Informed written consent was obtained from the patient after a thorough discussion of the procedural risks, benefits and alternatives. All questions were addressed. Maximal Sterile Barrier Technique was utilized including caps, mask, sterile gowns, sterile gloves, sterile drape, hand hygiene and skin antiseptic. A timeout was performed prior to the initiation of the procedure. PROCEDURE: Previous imaging reviewed. Patient position prone. Noncontrast localization CT performed. The right psoas abscess was localized and marked for a posterior paraspinous approach. The right gluteal abscess was also marked for a posterior approach. Right psoas abscess drain: Under sterile conditions and local anesthesia, an 18 gauge 15 cm access was advanced to the right psoas fluid collection. Needle position confirmed with CT. Syringe aspiration yielded thick purulent fluid. Guidewire inserted followed by tract dilatation insert a 12 French drain. Drain catheter position confirmed with CT. Aspiration yielded a total of 15 cc thick purulent fluid. Culture sent for sample. Catheter secured with Prolene suture. Drainage catheter flushed with saline and connected to external suction bulb. Sterile dressing applied. No immediate complication. Patient tolerated the procedure well. Right gluteal abscess aspiration: In a similar fashion, the right gluteal area was sterilely prepped and draped. Under  sterile conditions and local anesthesia, CT guidance utilized to advance an 18 gauge 10 cm access needle into the right gluteal intramuscular fluid collection. Needle position confirmed with CT. Syringe aspiration yielded similar thick purulent fluid approximately 8 cc. Sample sent for culture. Needle removed. No immediate complication. Patient tolerated the procedure well. FINDINGS: CT imaging confirms needle placed in the right psoas abscess for drain placement. CT imaging confirms needle placement in the right gluteal abscess for aspiration. IMPRESSION: Successful CT-guided right psoas abscess drain placement Successful CT-guided right gluteal abscess aspiration. Cultures sent for both sites. Electronically Signed   By: Jerilynn Mages.  Shick M.D.   On: 08/11/2019 10:59   [place] EKG SITE RITE  Result Date: 08/07/2019 If Site Rite image not attached, placement could not be confirmed due to current cardiac rhythm.  [place] EKG SITE RITE  Result Date: 08/07/2019 If Site Rite image not attached, placement could not be confirmed due to current cardiac rhythm.  IR THORACENTESIS ASP PLEURAL SPACE W/IMG GUIDE  Result Date: 08/09/2019 INDICATION: 34 year old with a small right chest empyema. EXAM: ULTRASOUND GUIDED RIGHT THORACENTESIS MEDICATIONS: None. COMPLICATIONS: None  immediate. PROCEDURE: An ultrasound guided thoracentesis was thoroughly discussed with the patient and questions answered. The benefits, risks, alternatives and complications were also discussed. The patient understands and wishes to proceed with the procedure. Written consent was obtained. Ultrasound was used to evaluate the right upper chest. Small complex pleural-based collection was identified in the right upper chest. Skin was prepped with chlorhexidine and sterile field was created. Skin was anesthetized with 1% lidocaine. A 19 gauge, 7 cm Yueh catheter was directed into the small collection under real-time ultrasound guidance. Approximately 13 mL of thick  yellow purulent fluid was aspirated. Fluid was collected and sent for culture. Bandage placed over the puncture site. FINDINGS: Small irregular pleural-based collection in the anterior right upper chest. This collection corresponds with the small pleural-based collection on recent CT imaging. Yueh catheter was successfully placed within the collection and 13 mL of thick yellow purulent fluid was aspirated. Collection was slightly smaller based on ultrasound after the fluid had been aspirated. IMPRESSION: Successful ultrasound guided right thoracentesis yielding 13 mL of purulent fluid from the small right chest empyema. Electronically Signed   By: Markus Daft M.D.   On: 08/09/2019 18:27      Discharge Exam: Vitals:   09/03/19 0441 09/03/19 1337  BP: 101/77 96/67  Pulse: (!) 129 (!) 120  Resp: 20   Temp: 98.7 F (37.1 C) 98.2 F (36.8 C)  SpO2: 97% 98%    GENERAL: No acute distress.  Appears well.  HEENT: MMM.  Vision and hearing grossly intact.  NECK: Supple.  No apparent JVD.  RESP:  No IWOB. Good air movement bilaterally. CVS:  RRR. Heart sounds normal.  ABD/GI/GU: Bowel sounds present. Soft. Non tender.  MSK/EXT:  Moves extremities. No apparent deformity or edema.  Diffuse tenderness over right hip and thigh.  No overlying skin erythema or apparent swelling or fluid loculation. SKIN: no apparent skin lesion or wound NEURO: Awake, alert and oriented appropriately.  No apparent focal neuro deficit. PSYCH: Calm.  Flat affect.   The results of significant diagnostics from this hospitalization (including imaging, microbiology, ancillary and laboratory) are listed below for reference.     Microbiology: No results found for this or any previous visit (from the past 240 hour(s)).   Labs: BNP (last 3 results) No results for input(s): BNP in the last 8760 hours. Basic Metabolic Panel: Recent Labs  Lab 08/28/19 0416 08/30/19 0703  NA 139 136  K 4.4 4.1  CL 100 99  CO2 28 25    GLUCOSE 113* 101*  BUN 8 8  CREATININE 0.63 0.57  CALCIUM 8.9 8.8*   Liver Function Tests: No results for input(s): AST, ALT, ALKPHOS, BILITOT, PROT, ALBUMIN in the last 168 hours. No results for input(s): LIPASE, AMYLASE in the last 168 hours. No results for input(s): AMMONIA in the last 168 hours. CBC: Recent Labs  Lab 08/28/19 0416 09/02/19 1506  WBC 5.2 8.0  NEUTROABS 2.9 5.0  HGB 9.5* 9.7*  HCT 29.9* 31.0*  MCV 85.2 84.9  PLT 388 393   Cardiac Enzymes: No results for input(s): CKTOTAL, CKMB, CKMBINDEX, TROPONINI in the last 168 hours. BNP: Invalid input(s): POCBNP CBG: No results for input(s): GLUCAP in the last 168 hours. D-Dimer No results for input(s): DDIMER in the last 72 hours. Hgb A1c No results for input(s): HGBA1C in the last 72 hours. Lipid Profile No results for input(s): CHOL, HDL, LDLCALC, TRIG, CHOLHDL, LDLDIRECT in the last 72 hours. Thyroid function studies No results for input(s):  TSH, T4TOTAL, T3FREE, THYROIDAB in the last 72 hours.  Invalid input(s): FREET3 Anemia work up No results for input(s): VITAMINB12, FOLATE, FERRITIN, TIBC, IRON, RETICCTPCT in the last 72 hours. Urinalysis    Component Value Date/Time   COLORURINE YELLOW 08/17/2019 1330   APPEARANCEUR CLOUDY (A) 08/17/2019 1330   APPEARANCEUR Clear 04/03/2014 0059   LABSPEC 1.011 08/17/2019 1330   LABSPEC 1.016 04/03/2014 0059   PHURINE 7.0 08/17/2019 1330   GLUCOSEU NEGATIVE 08/17/2019 1330   GLUCOSEU Negative 04/03/2014 0059   HGBUR NEGATIVE 08/17/2019 1330   BILIRUBINUR NEGATIVE 08/17/2019 1330   BILIRUBINUR Negative 04/03/2014 0059   KETONESUR NEGATIVE 08/17/2019 1330   PROTEINUR NEGATIVE 08/17/2019 1330   NITRITE NEGATIVE 08/17/2019 1330   LEUKOCYTESUR LARGE (A) 08/17/2019 1330   LEUKOCYTESUR Negative 04/03/2014 0059   Sepsis Labs Invalid input(s): PROCALCITONIN,  WBC,  LACTICIDVEN   Time coordinating discharge: 35 minutes  SIGNED:  Mercy Riding, MD  [facility] 09/03/2019, 3:38 PM  If 7PM-7AM, please contact night-coverage [email or web address] Password [email or web address]

## 2019-09-03 NOTE — Progress Notes (Signed)
Patient has expressed desire to leave the hospital AMA. Dr. Cyndia Skeeters aware. AMA paper provided to patient; patient verbalized understanding of the risks associated with leaving AMA. Patient signed AMA paper; paper placed in chart. Patient awaiting PICC line removal before leaving.

## 2019-09-03 NOTE — Progress Notes (Addendum)
Patient  Alert and oriented x4,would like to leave AMA. Dr. Cyndia Skeeters made aware>ordered for PICC line removal per IV team.

## 2019-09-12 MED ORDER — DIPHENHYDRAMINE HCL 25 MG PO CAPS
50.00 | ORAL_CAPSULE | ORAL | Status: DC
Start: ? — End: 2019-09-12

## 2019-09-12 MED ORDER — SENNOSIDES 8.6 MG PO TABS
2.00 | ORAL_TABLET | ORAL | Status: DC
Start: 2019-09-12 — End: 2019-09-12

## 2019-09-12 MED ORDER — THERA-M PO TABS
1.00 | ORAL_TABLET | ORAL | Status: DC
Start: 2019-10-03 — End: 2019-09-12

## 2019-09-12 MED ORDER — METRONIDAZOLE IN NACL 5-0.79 MG/ML-% IV SOLN
500.00 | INTRAVENOUS | Status: DC
Start: 2019-09-12 — End: 2019-09-12

## 2019-09-12 MED ORDER — GENERIC EXTERNAL MEDICATION
1250.00 | Status: DC
Start: 2019-09-12 — End: 2019-09-12

## 2019-09-12 MED ORDER — ONDANSETRON 4 MG PO TBDP
4.00 | ORAL_TABLET | ORAL | Status: DC
Start: ? — End: 2019-09-12

## 2019-09-12 MED ORDER — KETOROLAC TROMETHAMINE 30 MG/ML IJ SOLN
30.00 | INTRAMUSCULAR | Status: DC
Start: ? — End: 2019-09-12

## 2019-09-12 MED ORDER — SUMATRIPTAN SUCCINATE 50 MG PO TABS
50.00 | ORAL_TABLET | ORAL | Status: DC
Start: ? — End: 2019-09-12

## 2019-09-12 MED ORDER — HYDROMORPHONE HCL 1 MG/ML IJ SOLN
1.00 | INTRAMUSCULAR | Status: DC
Start: ? — End: 2019-09-12

## 2019-09-12 MED ORDER — ACETAMINOPHEN 500 MG PO TABS
1000.00 | ORAL_TABLET | ORAL | Status: DC
Start: 2019-10-03 — End: 2019-09-12

## 2019-09-12 MED ORDER — GABAPENTIN 300 MG PO CAPS
300.00 | ORAL_CAPSULE | ORAL | Status: DC
Start: 2019-09-12 — End: 2019-09-12

## 2019-09-12 MED ORDER — NICOTINE 14 MG/24HR TD PT24
1.00 | MEDICATED_PATCH | TRANSDERMAL | Status: DC
Start: 2019-09-12 — End: 2019-09-12

## 2019-09-12 MED ORDER — CEFEPIME HCL 2 G IJ SOLR
2.00 | INTRAMUSCULAR | Status: DC
Start: 2019-09-12 — End: 2019-09-12

## 2019-09-12 MED ORDER — CALCIUM CARBONATE 1250 (500 CA) MG PO CHEW
CHEWABLE_TABLET | ORAL | Status: DC
Start: ? — End: 2019-09-12

## 2019-09-12 MED ORDER — ENOXAPARIN SODIUM 40 MG/0.4ML ~~LOC~~ SOLN
40.00 | SUBCUTANEOUS | Status: DC
Start: 2019-10-03 — End: 2019-09-12

## 2019-09-12 MED ORDER — MELATONIN 3 MG PO TABS
3.00 | ORAL_TABLET | ORAL | Status: DC
Start: 2019-09-12 — End: 2019-09-12

## 2019-09-12 MED ORDER — GENERIC EXTERNAL MEDICATION
150.00 | Status: DC
Start: 2019-09-27 — End: 2019-09-12

## 2019-09-12 MED ORDER — DULOXETINE HCL 30 MG PO CPEP
30.00 | ORAL_CAPSULE | ORAL | Status: DC
Start: 2019-09-12 — End: 2019-09-12

## 2019-09-12 MED ORDER — POLYETHYLENE GLYCOL 3350 17 GM/SCOOP PO POWD
17.00 | ORAL | Status: DC
Start: 2019-09-12 — End: 2019-09-12

## 2019-09-24 MED ORDER — MELATONIN 3 MG PO TABS
3.00 | ORAL_TABLET | ORAL | Status: DC
Start: ? — End: 2019-09-24

## 2019-09-24 MED ORDER — DIPHENHYDRAMINE HCL 25 MG PO CAPS
50.00 | ORAL_CAPSULE | ORAL | Status: DC
Start: ? — End: 2019-09-24

## 2019-09-24 MED ORDER — GENERIC EXTERNAL MEDICATION
1500.00 | Status: DC
Start: 2019-09-25 — End: 2019-09-24

## 2019-09-24 MED ORDER — BUPRENORPHINE HCL-NALOXONE HCL 2-0.5 MG SL FILM
2.00 | ORAL_FILM | SUBLINGUAL | Status: DC
Start: 2019-09-25 — End: 2019-09-24

## 2019-09-24 MED ORDER — DSS 100 MG PO CAPS
200.00 | ORAL_CAPSULE | ORAL | Status: DC
Start: 2019-09-27 — End: 2019-09-24

## 2019-09-24 MED ORDER — SENNOSIDES 8.6 MG PO TABS
3.00 | ORAL_TABLET | ORAL | Status: DC
Start: 2019-09-27 — End: 2019-09-24

## 2019-09-24 MED ORDER — GABAPENTIN 300 MG PO CAPS
900.00 | ORAL_CAPSULE | ORAL | Status: DC
Start: 2019-09-25 — End: 2019-09-24

## 2019-09-24 MED ORDER — LORAZEPAM 1 MG PO TABS
1.00 | ORAL_TABLET | ORAL | Status: DC
Start: ? — End: 2019-09-24

## 2019-09-24 MED ORDER — HYDROCORTISONE 1 % EX OINT
TOPICAL_OINTMENT | CUTANEOUS | Status: DC
Start: 2019-09-27 — End: 2019-09-24

## 2019-09-24 MED ORDER — POLYETHYLENE GLYCOL 3350 17 GM/SCOOP PO POWD
17.00 | ORAL | Status: DC
Start: 2019-09-28 — End: 2019-09-24

## 2019-09-24 MED ORDER — DICLOFENAC SODIUM 1 % EX GEL
4.00 | CUTANEOUS | Status: DC
Start: ? — End: 2019-09-24

## 2019-09-24 MED ORDER — DULOXETINE HCL 60 MG PO CPEP
60.00 | ORAL_CAPSULE | ORAL | Status: DC
Start: 2019-09-28 — End: 2019-09-24

## 2019-09-24 MED ORDER — GENERIC EXTERNAL MEDICATION
Status: DC
Start: ? — End: 2019-09-24

## 2019-09-24 MED ORDER — NALOXONE HCL 0.4 MG/ML IJ SOLN
0.20 | INTRAMUSCULAR | Status: DC
Start: ? — End: 2019-09-24

## 2019-09-24 MED ORDER — HYDROMORPHONE HCL 4 MG PO TABS
8.00 | ORAL_TABLET | ORAL | Status: DC
Start: 2019-09-25 — End: 2019-09-24

## 2019-09-24 MED ORDER — FERROUS SULFATE 325 (65 FE) MG PO TABS
325.00 | ORAL_TABLET | ORAL | Status: DC
Start: 2019-10-03 — End: 2019-09-24

## 2019-09-24 MED ORDER — BUPRENORPHINE HCL-NALOXONE HCL 8-2 MG SL FILM
1.00 | ORAL_FILM | SUBLINGUAL | Status: DC
Start: 2019-09-25 — End: 2019-09-24

## 2019-09-24 MED ORDER — BACLOFEN 10 MG PO TABS
10.00 | ORAL_TABLET | ORAL | Status: DC
Start: ? — End: 2019-09-24

## 2019-09-24 MED ORDER — RIFAMPIN 300 MG PO CAPS
600.00 | ORAL_CAPSULE | ORAL | Status: DC
Start: 2019-10-03 — End: 2019-09-24

## 2019-09-27 MED ORDER — DICLOFENAC SODIUM 1 % EX GEL
4.00 | CUTANEOUS | Status: DC
Start: 2019-10-03 — End: 2019-09-27

## 2019-09-27 MED ORDER — HYDROMORPHONE HCL 4 MG PO TABS
4.00 | ORAL_TABLET | ORAL | Status: DC
Start: 2019-09-27 — End: 2019-09-27

## 2019-09-27 MED ORDER — GABAPENTIN 400 MG PO CAPS
1200.00 | ORAL_CAPSULE | ORAL | Status: DC
Start: 2019-10-03 — End: 2019-09-27

## 2019-09-27 MED ORDER — BUPRENORPHINE HCL-NALOXONE HCL 8-2 MG SL FILM
1.00 | ORAL_FILM | SUBLINGUAL | Status: DC
Start: 2019-10-03 — End: 2019-09-27

## 2019-09-27 MED ORDER — GENERIC EXTERNAL MEDICATION
Status: DC
Start: ? — End: 2019-09-27

## 2019-09-27 MED ORDER — LORAZEPAM 1 MG PO TABS
0.50 | ORAL_TABLET | ORAL | Status: DC
Start: ? — End: 2019-09-27

## 2019-09-27 MED ORDER — GENERIC EXTERNAL MEDICATION
1750.00 | Status: DC
Start: 2019-10-03 — End: 2019-09-27

## 2019-10-03 ENCOUNTER — Inpatient Hospital Stay: Payer: Medicaid Other | Admitting: Infectious Diseases

## 2019-10-03 MED ORDER — POLYETHYLENE GLYCOL 3350 17 GM/SCOOP PO POWD
17.00 | ORAL | Status: DC
Start: 2019-10-03 — End: 2019-10-03

## 2019-10-03 MED ORDER — SENNOSIDES 8.6 MG PO TABS
2.00 | ORAL_TABLET | ORAL | Status: DC
Start: 2019-10-03 — End: 2019-10-03

## 2019-10-03 MED ORDER — MIRTAZAPINE 15 MG PO TABS
7.50 | ORAL_TABLET | ORAL | Status: DC
Start: 2019-10-03 — End: 2019-10-03

## 2019-10-03 MED ORDER — NICOTINE 21 MG/24HR TD PT24
1.00 | MEDICATED_PATCH | TRANSDERMAL | Status: DC
Start: 2019-10-03 — End: 2019-10-03

## 2019-10-03 MED ORDER — MELATONIN 3 MG PO TABS
3.00 | ORAL_TABLET | ORAL | Status: DC
Start: 2019-10-03 — End: 2019-10-03

## 2019-10-03 MED ORDER — GENERIC EXTERNAL MEDICATION
Status: DC
Start: ? — End: 2019-10-03

## 2019-10-03 MED ORDER — BUPRENORPHINE HCL-NALOXONE HCL 2-0.5 MG SL FILM
2.00 | ORAL_FILM | SUBLINGUAL | Status: DC
Start: 2019-10-03 — End: 2019-10-03

## 2019-10-03 MED ORDER — HYDROMORPHONE HCL 2 MG PO TABS
3.00 | ORAL_TABLET | ORAL | Status: DC
Start: 2019-10-03 — End: 2019-10-03

## 2019-10-03 MED ORDER — GENERIC EXTERNAL MEDICATION
90.00 | Status: DC
Start: 2019-10-03 — End: 2019-10-03

## 2019-12-04 ENCOUNTER — Other Ambulatory Visit: Payer: Self-pay

## 2019-12-04 DIAGNOSIS — Z5329 Procedure and treatment not carried out because of patient's decision for other reasons: Secondary | ICD-10-CM | POA: Diagnosis present

## 2019-12-04 DIAGNOSIS — Z818 Family history of other mental and behavioral disorders: Secondary | ICD-10-CM

## 2019-12-04 DIAGNOSIS — F17210 Nicotine dependence, cigarettes, uncomplicated: Secondary | ICD-10-CM | POA: Diagnosis present

## 2019-12-04 DIAGNOSIS — M6008 Infective myositis, other site: Secondary | ICD-10-CM | POA: Diagnosis present

## 2019-12-04 DIAGNOSIS — M4628 Osteomyelitis of vertebra, sacral and sacrococcygeal region: Secondary | ICD-10-CM | POA: Diagnosis present

## 2019-12-04 DIAGNOSIS — F319 Bipolar disorder, unspecified: Secondary | ICD-10-CM | POA: Diagnosis present

## 2019-12-04 DIAGNOSIS — F419 Anxiety disorder, unspecified: Secondary | ICD-10-CM | POA: Diagnosis present

## 2019-12-04 DIAGNOSIS — Z8614 Personal history of Methicillin resistant Staphylococcus aureus infection: Secondary | ICD-10-CM

## 2019-12-04 DIAGNOSIS — Y838 Other surgical procedures as the cause of abnormal reaction of the patient, or of later complication, without mention of misadventure at the time of the procedure: Secondary | ICD-10-CM | POA: Diagnosis present

## 2019-12-04 DIAGNOSIS — M009 Pyogenic arthritis, unspecified: Secondary | ICD-10-CM | POA: Diagnosis present

## 2019-12-04 DIAGNOSIS — Z20822 Contact with and (suspected) exposure to covid-19: Secondary | ICD-10-CM | POA: Diagnosis present

## 2019-12-04 DIAGNOSIS — K8010 Calculus of gallbladder with chronic cholecystitis without obstruction: Secondary | ICD-10-CM | POA: Diagnosis present

## 2019-12-04 DIAGNOSIS — T85628A Displacement of other specified internal prosthetic devices, implants and grafts, initial encounter: Principal | ICD-10-CM | POA: Diagnosis present

## 2019-12-04 LAB — URINALYSIS, COMPLETE (UACMP) WITH MICROSCOPIC
Bilirubin Urine: NEGATIVE
Glucose, UA: NEGATIVE mg/dL
Hgb urine dipstick: NEGATIVE
Ketones, ur: NEGATIVE mg/dL
Nitrite: POSITIVE — AB
Protein, ur: NEGATIVE mg/dL
Specific Gravity, Urine: 1.021 (ref 1.005–1.030)
pH: 5 (ref 5.0–8.0)

## 2019-12-04 LAB — COMPREHENSIVE METABOLIC PANEL
ALT: 29 U/L (ref 0–44)
AST: 21 U/L (ref 15–41)
Albumin: 3.9 g/dL (ref 3.5–5.0)
Alkaline Phosphatase: 164 U/L — ABNORMAL HIGH (ref 38–126)
Anion gap: 10 (ref 5–15)
BUN: 8 mg/dL (ref 6–20)
CO2: 26 mmol/L (ref 22–32)
Calcium: 8.9 mg/dL (ref 8.9–10.3)
Chloride: 104 mmol/L (ref 98–111)
Creatinine, Ser: 0.67 mg/dL (ref 0.44–1.00)
GFR calc Af Amer: >60 mL/min (ref >60)
GFR calc non Af Amer: >60 mL/min (ref >60)
Glucose, Bld: 126 mg/dL — ABNORMAL HIGH (ref 70–99)
Potassium: 3.5 mmol/L (ref 3.5–5.1)
Sodium: 140 mmol/L (ref 135–145)
Total Bilirubin: 0.4 mg/dL (ref 0.3–1.2)
Total Protein: 8.2 g/dL — ABNORMAL HIGH (ref 6.5–8.1)

## 2019-12-04 LAB — CBC
HCT: 42.8 % (ref 36.0–46.0)
Hemoglobin: 14.2 g/dL (ref 12.0–15.0)
MCH: 25.8 pg — ABNORMAL LOW (ref 26.0–34.0)
MCHC: 33.2 g/dL (ref 30.0–36.0)
MCV: 77.7 fL — ABNORMAL LOW (ref 80.0–100.0)
Platelets: 288 10*3/uL (ref 150–400)
RBC: 5.51 MIL/uL — ABNORMAL HIGH (ref 3.87–5.11)
RDW: 13.7 % (ref 11.5–15.5)
WBC: 6.9 10*3/uL (ref 4.0–10.5)
nRBC: 0 % (ref 0.0–0.2)

## 2019-12-04 LAB — LIPASE, BLOOD: Lipase: 43 U/L (ref 11–51)

## 2019-12-04 LAB — POCT PREGNANCY, URINE: Preg Test, Ur: NEGATIVE

## 2019-12-04 MED ORDER — SODIUM CHLORIDE 0.9% FLUSH: 3.0000 mL | Freq: Once | INTRAVENOUS | Status: DC

## 2019-12-04 NOTE — ED Notes (Signed)
POC urine pregnancy test result= NEGATIVE

## 2019-12-04 NOTE — ED Triage Notes (Signed)
Pt arrives to ED via POV from home with c/o abdominal pain x2 days. Pt reports being recently hospitalized at [facility] for 4 months for a MRSA infection and recently 2-3 weeks ago. Pt reports pain is localized to the RUQ; (+) nausea, but no V/D. Pt reports known recent h/x of gallstones. No c/o fever; no urinary s/x's or c/o's. Pt is A&O, in NAD; RR even, regular, and unlabored.

## 2019-12-05 ENCOUNTER — Encounter: Payer: Self-pay | Admitting: Radiology

## 2019-12-05 ENCOUNTER — Emergency Department: Payer: Medicaid Other

## 2019-12-05 ENCOUNTER — Inpatient Hospital Stay
Admission: EM | Admit: 2019-12-05 | Discharge: 2019-12-05 | DRG: 920 | Discharge status: Against Medical Advice | Payer: Medicaid Other | Attending: Internal Medicine | Admitting: Internal Medicine

## 2019-12-05 ENCOUNTER — Inpatient Hospital Stay: Payer: Medicaid Other

## 2019-12-05 DIAGNOSIS — M25551 Pain in right hip: Secondary | ICD-10-CM | POA: Diagnosis present

## 2019-12-05 DIAGNOSIS — Z434 Encounter for attention to other artificial openings of digestive tract: Secondary | ICD-10-CM

## 2019-12-05 DIAGNOSIS — Z8614 Personal history of Methicillin resistant Staphylococcus aureus infection: Secondary | ICD-10-CM | POA: Diagnosis not present

## 2019-12-05 DIAGNOSIS — Y838 Other surgical procedures as the cause of abnormal reaction of the patient, or of later complication, without mention of misadventure at the time of the procedure: Secondary | ICD-10-CM | POA: Diagnosis present

## 2019-12-05 DIAGNOSIS — Z720 Tobacco use: Secondary | ICD-10-CM | POA: Diagnosis present

## 2019-12-05 DIAGNOSIS — M009 Pyogenic arthritis, unspecified: Secondary | ICD-10-CM | POA: Diagnosis present

## 2019-12-05 DIAGNOSIS — F1910 Other psychoactive substance abuse, uncomplicated: Secondary | ICD-10-CM | POA: Diagnosis present

## 2019-12-05 DIAGNOSIS — L0291 Cutaneous abscess, unspecified: Secondary | ICD-10-CM

## 2019-12-05 DIAGNOSIS — R109 Unspecified abdominal pain: Secondary | ICD-10-CM | POA: Diagnosis present

## 2019-12-05 DIAGNOSIS — Z5329 Procedure and treatment not carried out because of patient's decision for other reasons: Secondary | ICD-10-CM | POA: Diagnosis present

## 2019-12-05 DIAGNOSIS — F32A Depression, unspecified: Secondary | ICD-10-CM | POA: Diagnosis present

## 2019-12-05 DIAGNOSIS — M6008 Infective myositis, other site: Secondary | ICD-10-CM | POA: Diagnosis not present

## 2019-12-05 DIAGNOSIS — K819 Cholecystitis, unspecified: Secondary | ICD-10-CM | POA: Diagnosis not present

## 2019-12-05 DIAGNOSIS — T85628A Displacement of other specified internal prosthetic devices, implants and grafts, initial encounter: Secondary | ICD-10-CM | POA: Diagnosis present

## 2019-12-05 DIAGNOSIS — F319 Bipolar disorder, unspecified: Secondary | ICD-10-CM | POA: Diagnosis present

## 2019-12-05 DIAGNOSIS — F329 Major depressive disorder, single episode, unspecified: Secondary | ICD-10-CM | POA: Diagnosis not present

## 2019-12-05 DIAGNOSIS — Z818 Family history of other mental and behavioral disorders: Secondary | ICD-10-CM | POA: Diagnosis not present

## 2019-12-05 DIAGNOSIS — R1011 Right upper quadrant pain: Secondary | ICD-10-CM

## 2019-12-05 DIAGNOSIS — N390 Urinary tract infection, site not specified: Secondary | ICD-10-CM | POA: Insufficient documentation

## 2019-12-05 DIAGNOSIS — F419 Anxiety disorder, unspecified: Secondary | ICD-10-CM | POA: Diagnosis present

## 2019-12-05 DIAGNOSIS — M4628 Osteomyelitis of vertebra, sacral and sacrococcygeal region: Secondary | ICD-10-CM | POA: Diagnosis present

## 2019-12-05 DIAGNOSIS — Z20822 Contact with and (suspected) exposure to covid-19: Secondary | ICD-10-CM | POA: Diagnosis present

## 2019-12-05 DIAGNOSIS — F17210 Nicotine dependence, cigarettes, uncomplicated: Secondary | ICD-10-CM | POA: Diagnosis present

## 2019-12-05 DIAGNOSIS — K8010 Calculus of gallbladder with chronic cholecystitis without obstruction: Secondary | ICD-10-CM | POA: Diagnosis present

## 2019-12-05 LAB — URINE DRUG SCREEN, QUALITATIVE (ARMC ONLY)
Amphetamines, Ur Screen: NOT DETECTED
Barbiturates, Ur Screen: NOT DETECTED
Benzodiazepine, Ur Scrn: NOT DETECTED
Cannabinoid 50 Ng, Ur ~~LOC~~: NOT DETECTED
Cocaine Metabolite,Ur ~~LOC~~: NOT DETECTED
MDMA (Ecstasy)Ur Screen: NOT DETECTED
Methadone Scn, Ur: NOT DETECTED
Opiate, Ur Screen: NOT DETECTED
Phencyclidine (PCP) Ur S: NOT DETECTED
Tricyclic, Ur Screen: NOT DETECTED

## 2019-12-05 LAB — SARS CORONAVIRUS 2 BY RT PCR (HOSPITAL ORDER, PERFORMED IN ~~LOC~~ HOSPITAL LAB): SARS Coronavirus 2: NEGATIVE

## 2019-12-05 IMAGING — MR MR PELVIS WO/W CM
5 of 8 series · 29 of 48 positions shown · IV contrast (7.5ml Gadavist)
Comparison: CT same day, MRI [DATE]

CLINICAL DATA: Right hip pain, worsening, treatment for 4 months of
MRSA infection

EXAM:
MRI PELVIS WITHOUT AND WITH CONTRAST
TECHNIQUE: Multiplanar multisequence MR imaging of the pelvis was performed
both before and after administration of intravenous contrast.
CONTRAST:  7.5mL GADAVIST GADOBUTROL 1 MMOL/ML IV SOLN

[Series 15: T1 · axial · 4.0mm · 0.82mm/px · z∈[-178,+57]mm · 6 of 48 slices shown (1 of 2)]
[im 1/48]
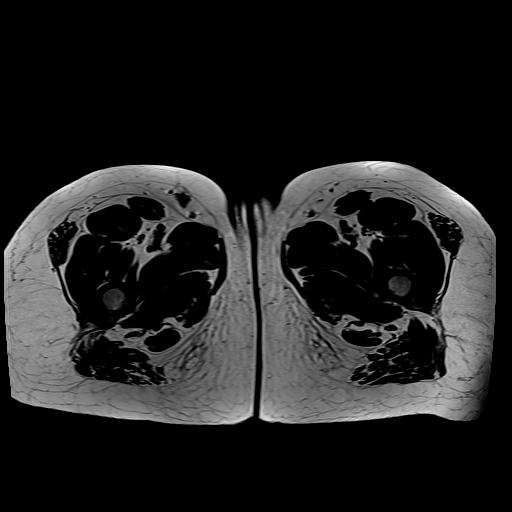
[im 10/48]
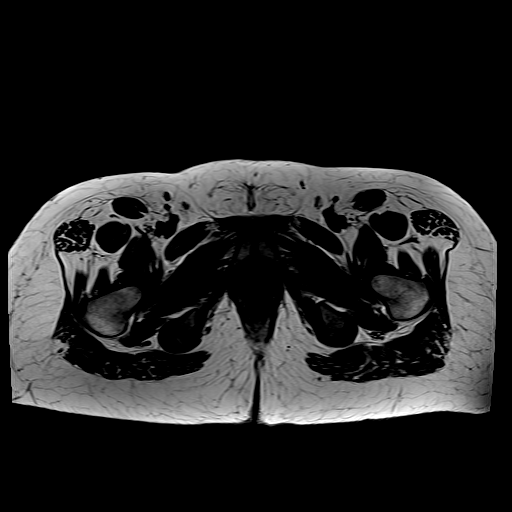
[im 19/48]
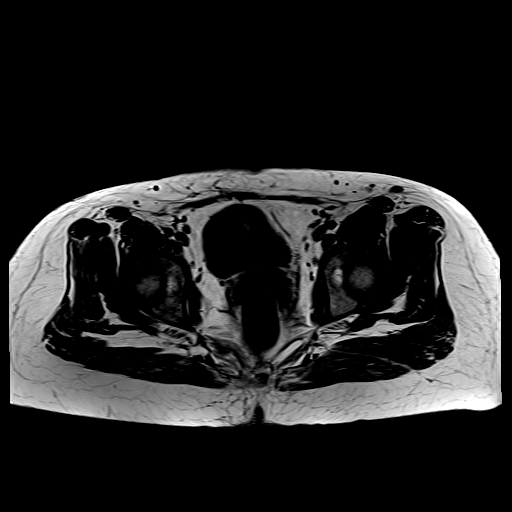
[im 29/48]
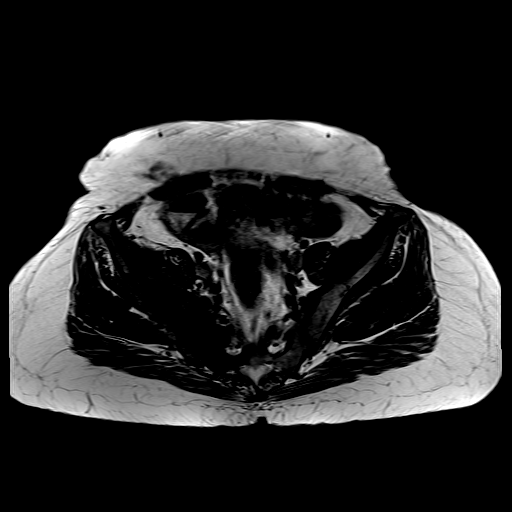
[im 38/48]
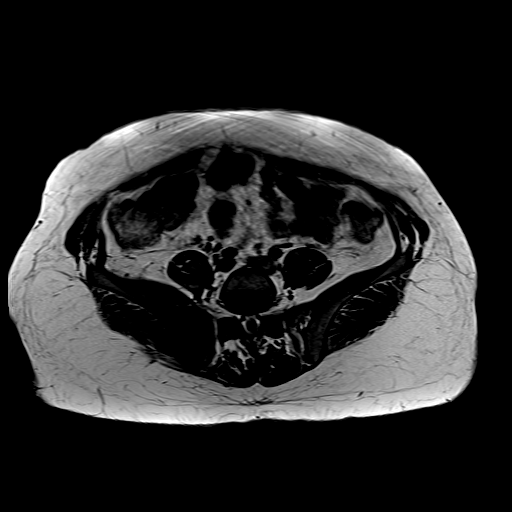
[im 48/48]
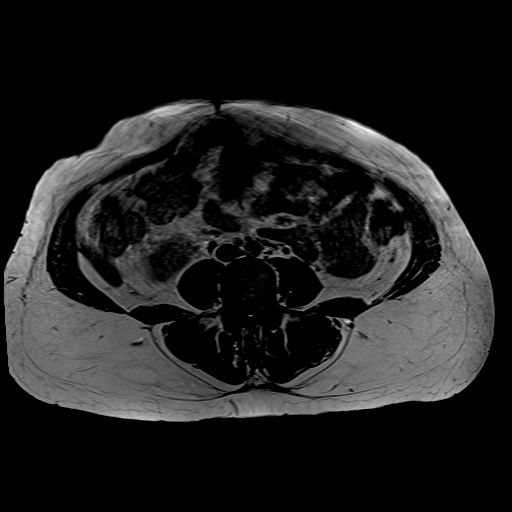

[Series 16: T2 fat-sat · axial · 4.0mm · 0.82mm/px · z∈[-178,+57]mm · 6 of 48 slices shown (1 of 2)]
[im 1/48]
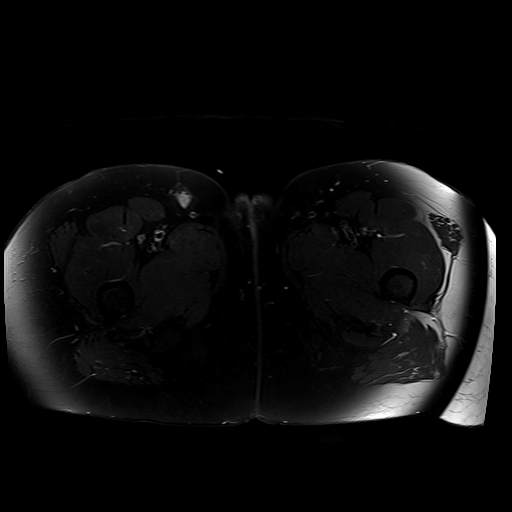
[im 10/48]
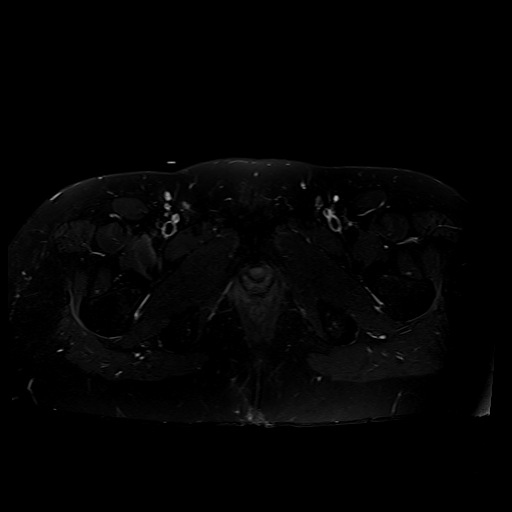
[im 19/48]
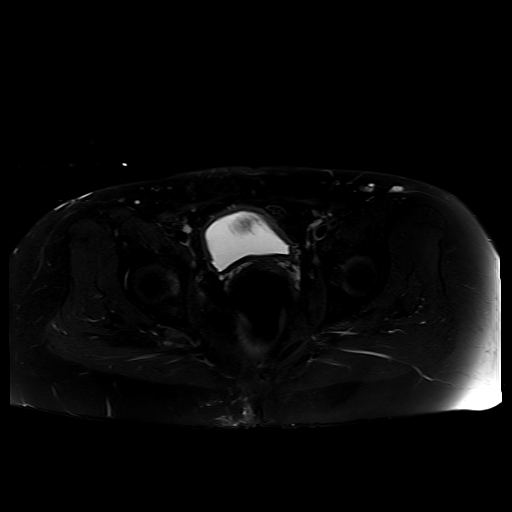
[im 29/48]
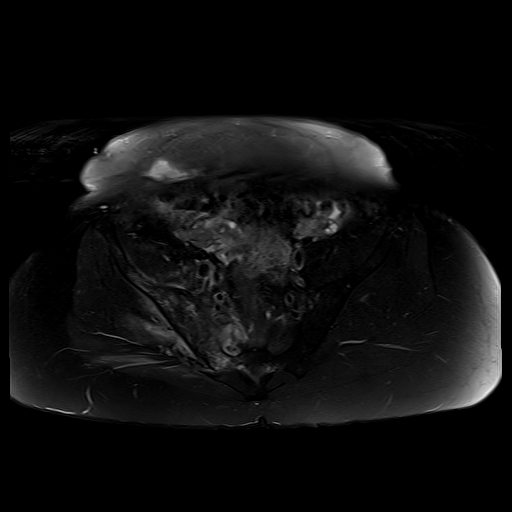
[im 38/48]
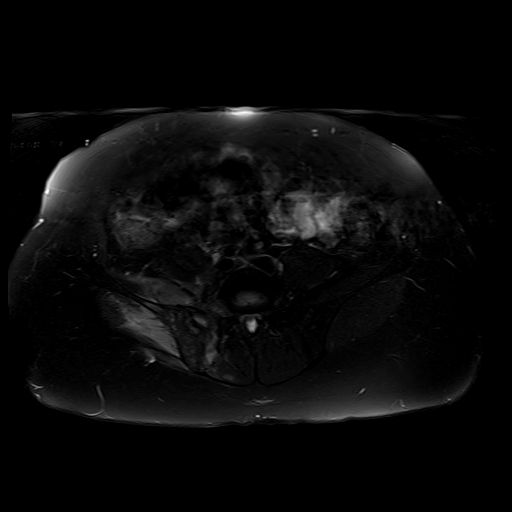
[im 48/48]
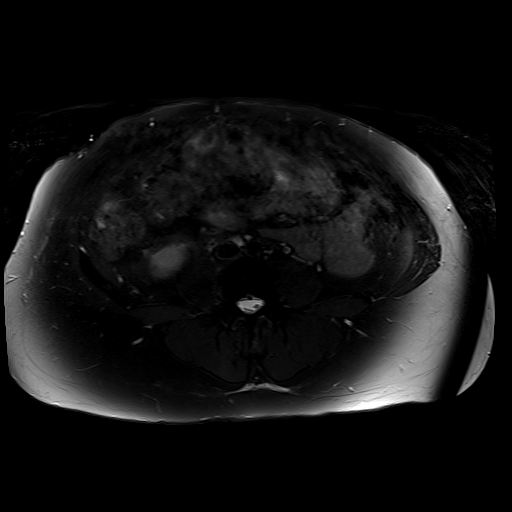

[Series 17: STIR · coronal · 4.0mm · 1.25mm/px · 5 of 46 slices shown]
[im 1/46]
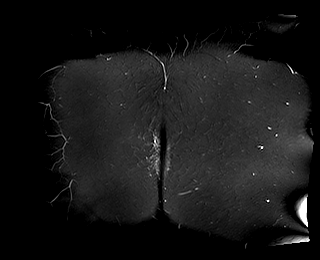
[im 12/46]
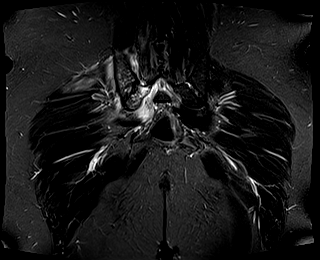
[im 23/46]
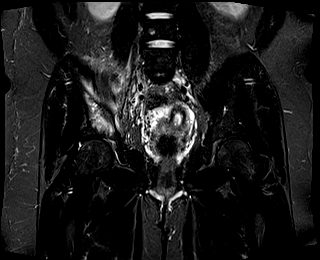
[im 34/46]
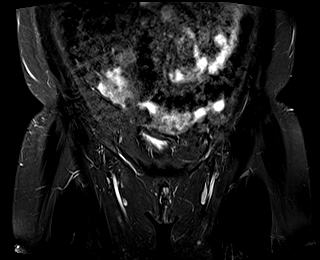
[im 46/46]
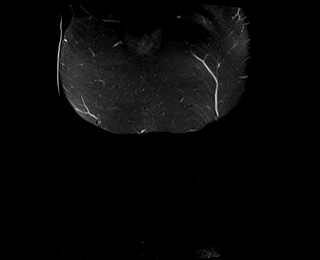

[Series 18: T1 · coronal · 4.0mm · 1.25mm/px · 5 of 46 slices shown (2 of 2)]
[im 1/46]
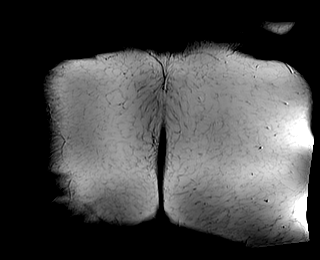
[im 12/46]
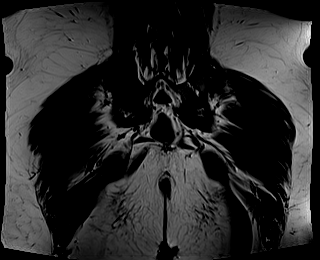
[im 23/46]
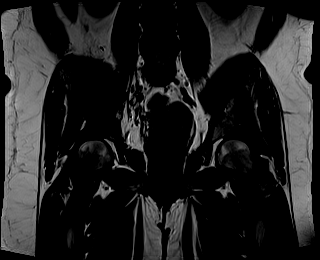
[im 34/46]
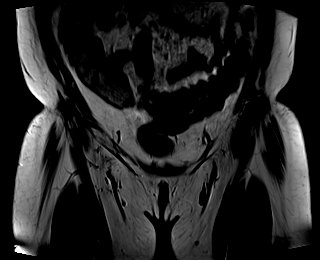
[im 46/46]
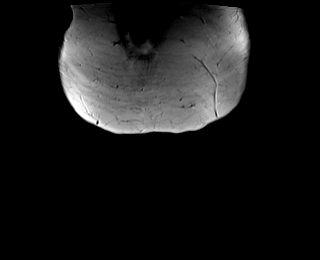

[Series 19: T2 fat-sat · sagittal · 4.0mm · 0.94mm/px · 7 of 72 slices shown (2 of 2)]
[im 1/72]
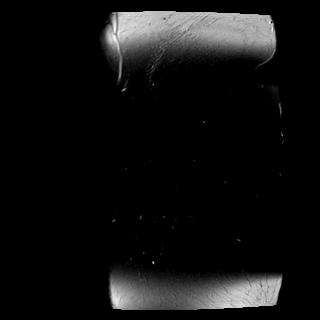
[im 9/72]
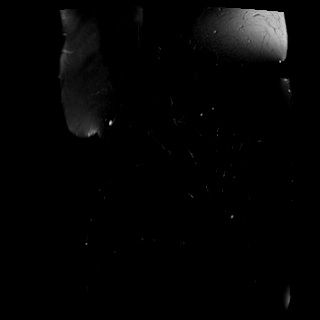
[im 18/72]
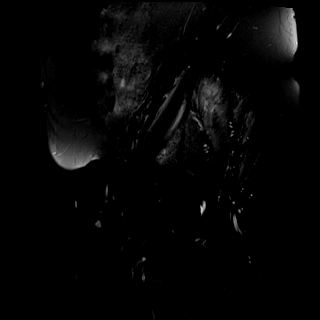
[im 27/72]
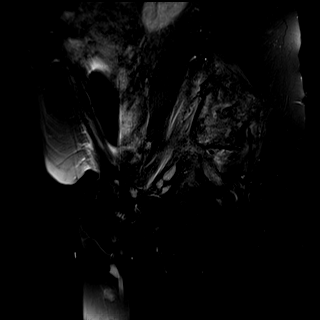
[im 45/72]
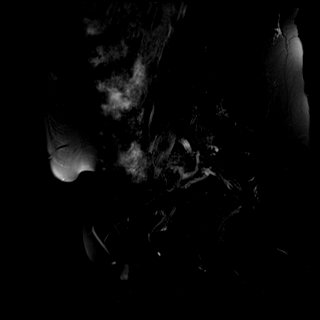
[im 54/72]
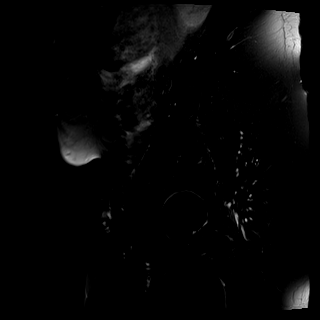
[im 63/72]
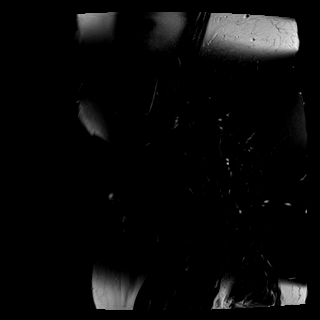

[29 of 48 positions shown; findings below may reference images not displayed]

FINDINGS: Bones: Again noted is patchy increased T2 hyperintense/T1
hypointense signal seen throughout the right sacroiliac joint,
slightly improved in appearance than the prior exam. There is
however increasing sclerosis seen around the sacroiliac joint. There
is erosive type change seen at the sacroiliac joint with a small
amount of joint fluid which also appears to be slightly improved
from the prior exam. No osseous fracture is seen. The remainder of
the osseous structures are intact.

Articular cartilage and labrum

Articular cartilage: No focal chondral defect or subchondral signal
abnormality identified. The ligamentum teres is intact

Labrum: There is no gross labral tear or paralabral abnormality.

Joint or bursal effusion

Joint effusion: Again noted is heterogeneous fluid within the right
sacroiliac joint, slightly improved since the prior exam. No hip
joint effusion is noted.

Bursae: No focal periarticular fluid collection.

Muscles and tendons

Muscles and tendons: Increased feathery T2 hyperintense signal seen
throughout the gluteal musculature, iliopsoas, erector spine a and
within the piriformis. There is a there appears to be a new
collection within the piriformis musculature measuring approximately
3.0 x 1.1 by 2.4 cm, consistent with intramuscular abscess. There is
extensive heterogeneous signal seen within the remainder of the
piriformis musculature.

Other findings

Miscellaneous: The visualized internal pelvic contents appear
unremarkable.
IMPRESSION: 1. Findings consistent ongoing extensive septic arthritis and
osteomyelitis of the right sacroiliac joint with a small joint
effusion, however slightly improved in appearance from the prior
exam.
2. new small heterogeneous collection within the right piriformis
measuring 3.0 x 1.1 x 2.4 cm, consistent with intramuscular abscess.
3. Slightly improved appearance of the myositis involving the
gluteal, piriformis, and iliopsoas musculature.

## 2019-12-05 IMAGING — CT CT ABD-PELV W/ CM
2 of 4 series · 15 of 46 positions shown, 17 images · IV contrast (APPLIED)
Comparison: Ultrasound from earlier in the same day as well as
[DATE], MRI from [DATE]

CLINICAL DATA: Increasing abdominal pain

EXAM:
CT ABDOMEN AND PELVIS WITH CONTRAST
TECHNIQUE: Multidetector CT imaging of the abdomen and pelvis was performed
using the standard protocol following bolus administration of
intravenous contrast.
CONTRAST:  100mL OMNIPAQUE IOHEXOL 300 MG/ML  SOLN

[Series 2: routine abd/pel with · axial · 0.86mm/px · z∈[-1002,-568]mm · 12 of 97 slices shown, 14 images]
[im 5/97  soft-tissue]
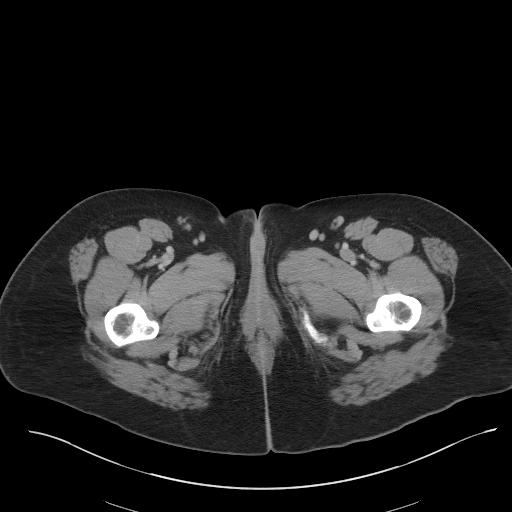
[im 5/97  bone]
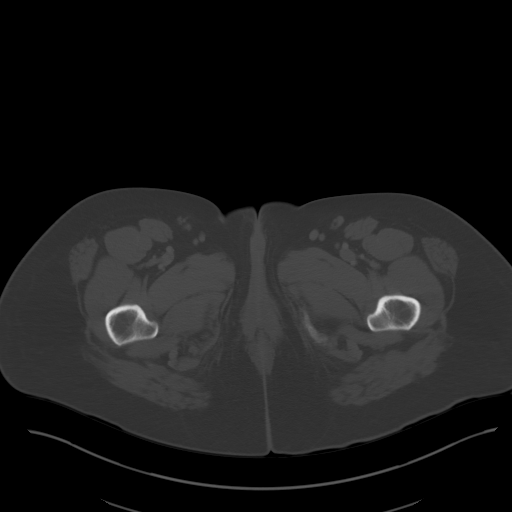
[im 14/97  soft-tissue]
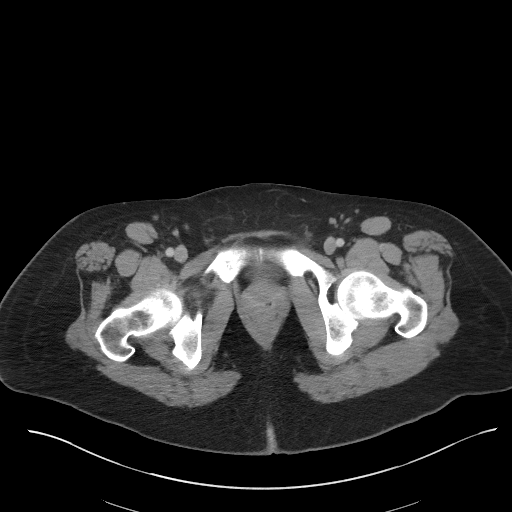
[im 22/97  soft-tissue]
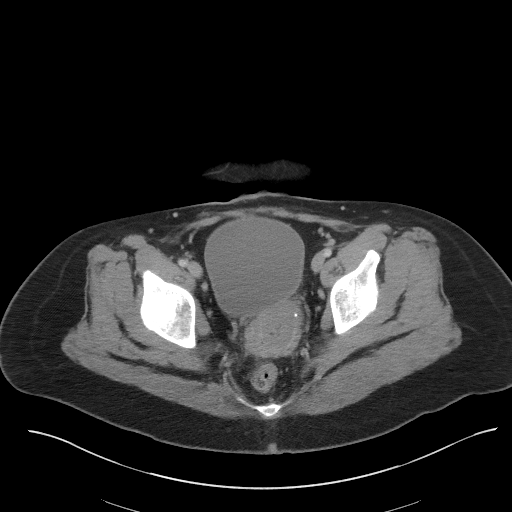
[im 31/97  soft-tissue]
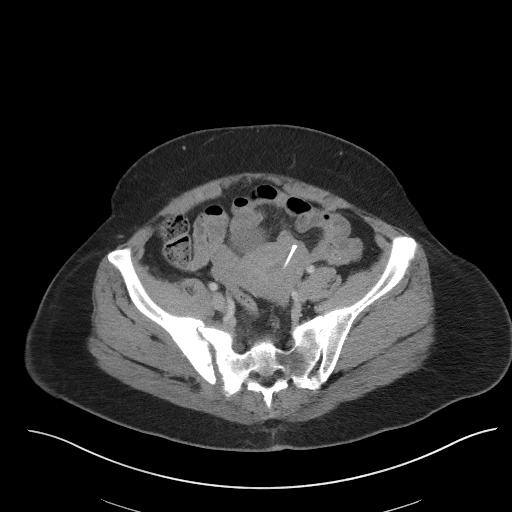
[im 35/97  soft-tissue]
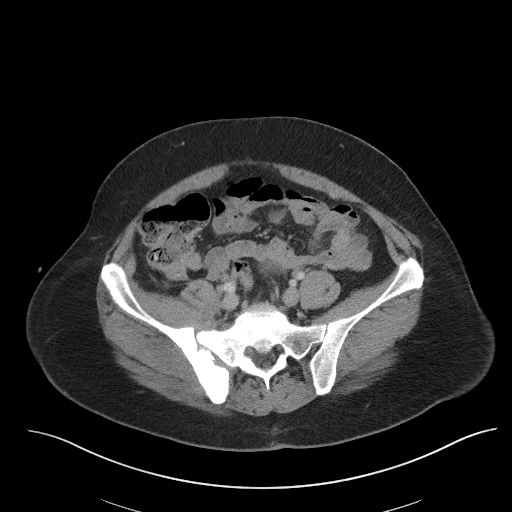
[im 44/97  soft-tissue]
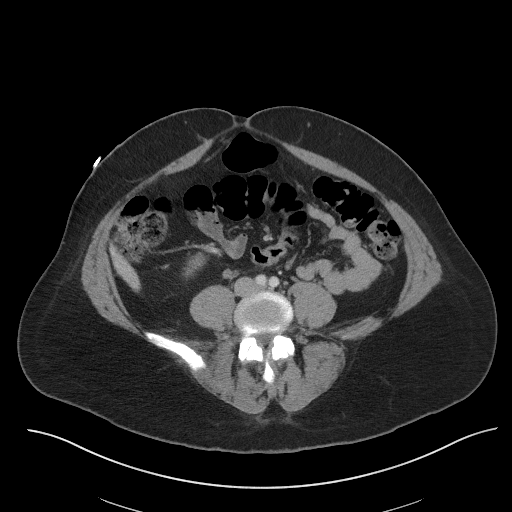
[im 53/97  soft-tissue]
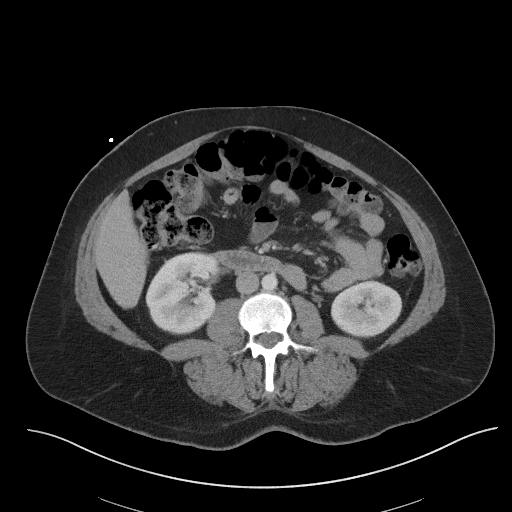
[im 62/97  soft-tissue]
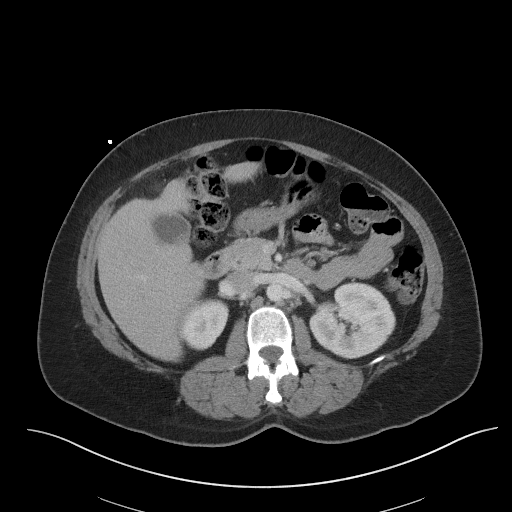
[im 66/97  soft-tissue]
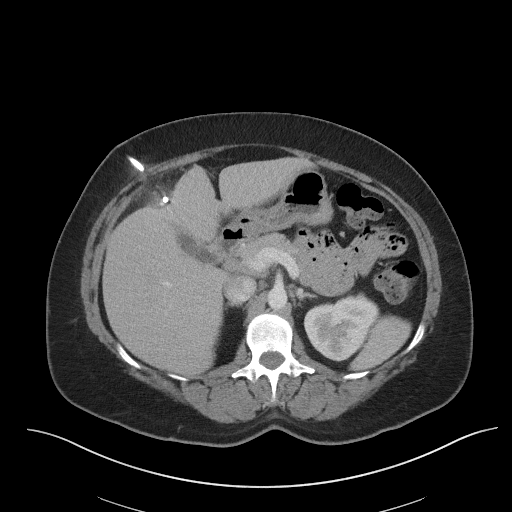
[im 66/97  bone]
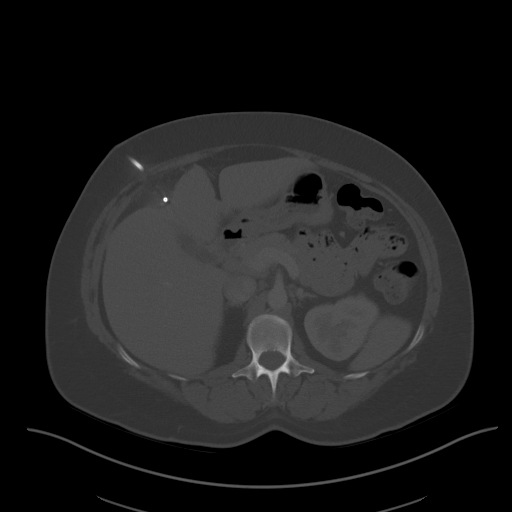
[im 75/97  soft-tissue]
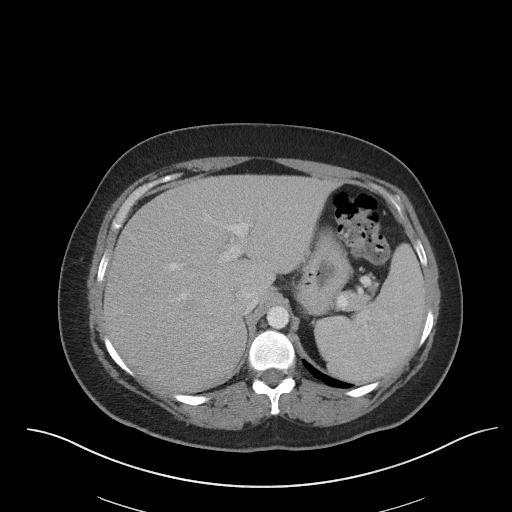
[im 83/97  soft-tissue]
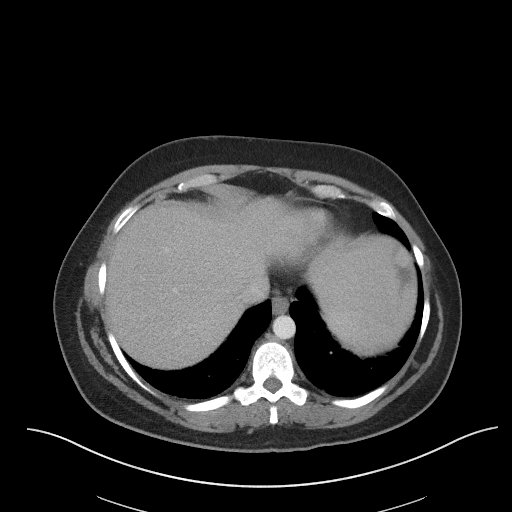
[im 92/97  soft-tissue]
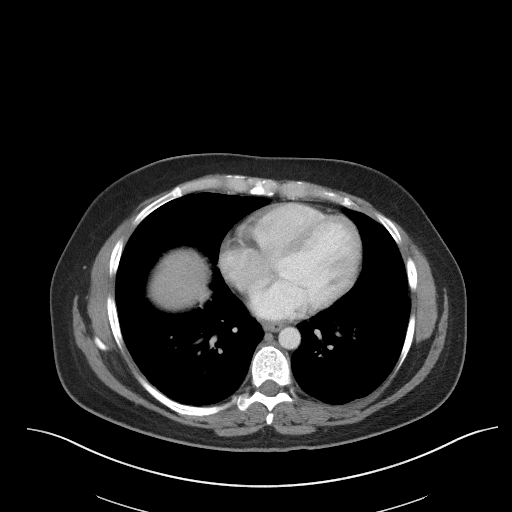

[Series 5: coronal st · coronal · 0.73mm/px · 3 of 90 slices shown]
[im 30/90  soft-tissue]
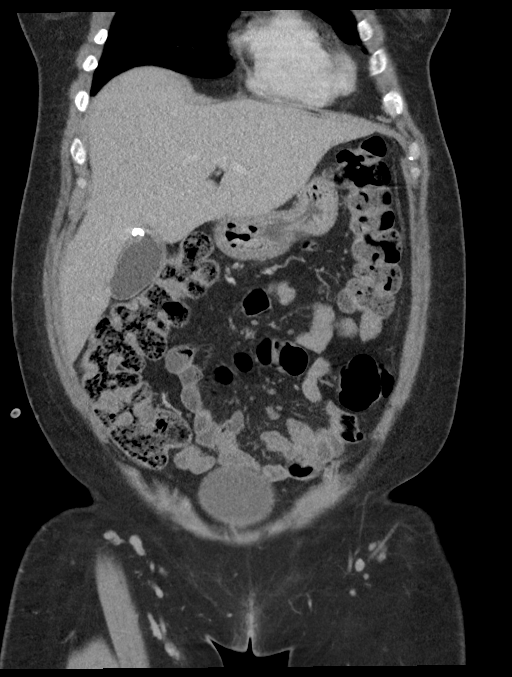
[im 40/90  soft-tissue]
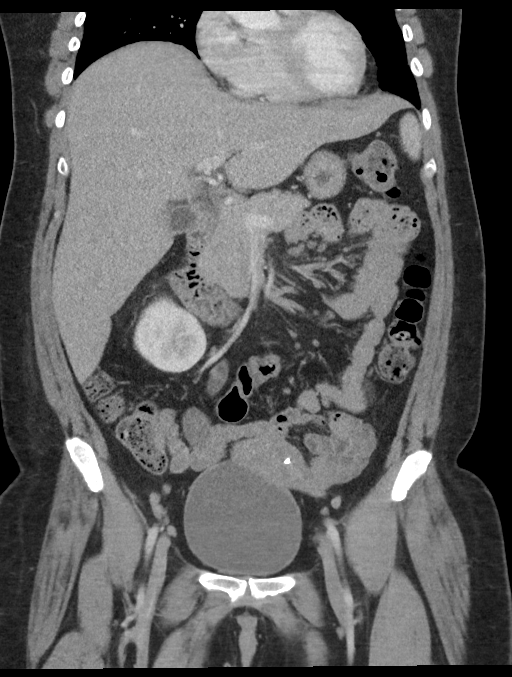
[im 50/90  soft-tissue]
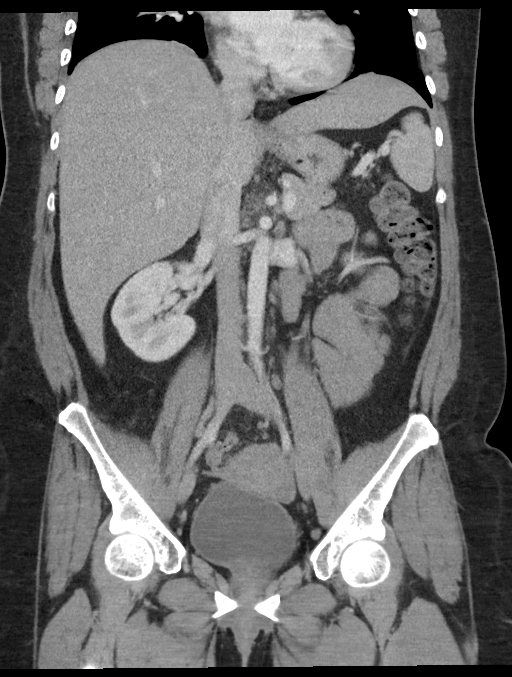

[15 of 46 positions shown; findings below may reference images not displayed]

FINDINGS: Lower chest: Within normal limits.

Hepatobiliary: Liver is mildly fatty infiltrated. Gallbladder is
partially distended. Known cholelithiasis is not as well visualized
as on the recent ultrasound examination due to poor calcification.
There is a right upper quadrant catheter identified adjacent to the
gallbladder but not within the gallbladder which likely represents a
displaced percutaneous cholecystostomy tube. Correlate with any
drainage. Injection may be helpful as well.

Pancreas: Unremarkable. No pancreatic ductal dilatation or
surrounding inflammatory changes.

Spleen: Normal in size without focal abnormality.

Adrenals/Urinary Tract: Adrenal glands are within normal limits.
Kidneys demonstrate a normal enhancement pattern bilaterally. No
renal calculi or obstructive changes are seen. Bladder is well
distended.

Stomach/Bowel: The appendix is within normal limits. Colon shows no
obstructive or inflammatory changes. Small bowel is unremarkable.
Stomach is within normal limits.

Vascular/Lymphatic: No significant vascular findings are present. No
enlarged abdominal or pelvic lymph nodes.

Reproductive: Uterus is within normal limits. An IUD is noted in
place but is significantly displaced to the left with at least one
of the arms extending to the margin of the uterus on the left and
possibly into the peritoneal cavity. Clinical correlation is
recommended. This may knee removal and replacement. It is possible
the displacement is related to the prior Caesarean scar.

Other: No abdominal wall hernia or abnormality. No abdominopelvic
ascites.

Musculoskeletal: No acute bony abnormality is noted. Increased
sclerosis is noted involving the right sacroiliac joint this is
similar to that seen on prior MRI examination.
IMPRESSION: Cholelithiasis and evidence of prior percutaneous cholecystostomy
tube placement which now is displaced adjacent to the fundus of the
gallbladder. Injection may be helpful to assess catheter position. A
small amount of fluid is noted surrounding the catheter which likely
represent some bile leakage.

Misplaced IUD as described which may be related to the Caesarean
scar.

Increased sclerosis in the right sacroiliac joint with widening of
the joint similar to that seen on recent MRI examination.

## 2019-12-05 IMAGING — US US ABDOMEN LIMITED
1 series · 14 of 25 positions shown · non-contrast
Comparison: CT from [DATE]

CLINICAL DATA: Right upper quadrant pain common no known right
upper quadrant drainage

EXAM:
ULTRASOUND ABDOMEN LIMITED RIGHT UPPER QUADRANT

[Series 1: us abdomen limited ruq · 14 of 45 slices shown]
[im 1/45]
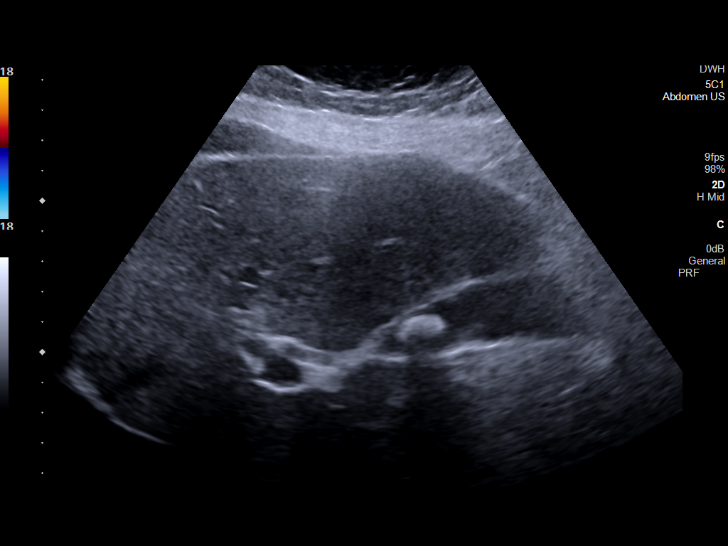
[im 4/45]
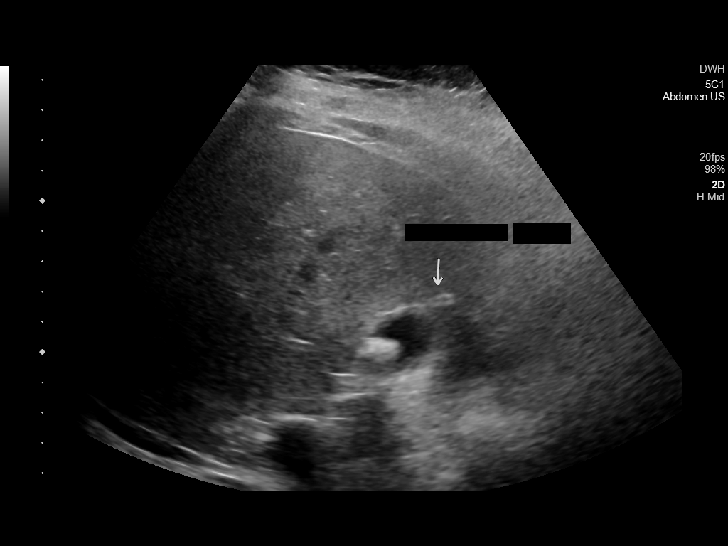
[im 8/45]
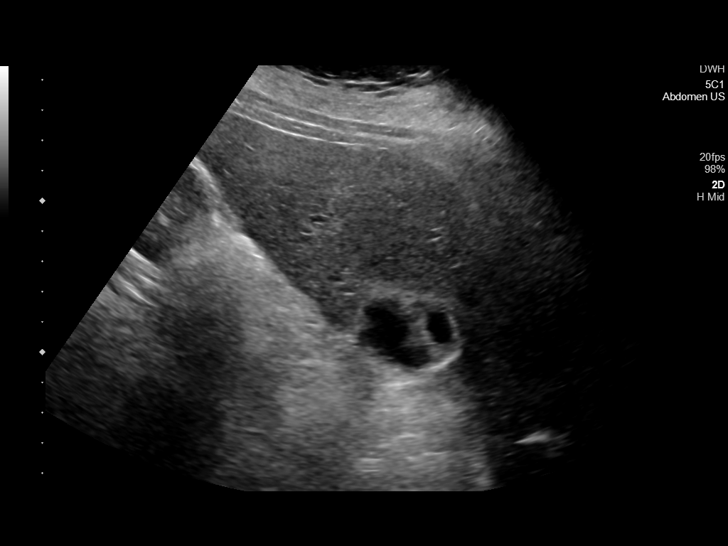
[im 12/45]
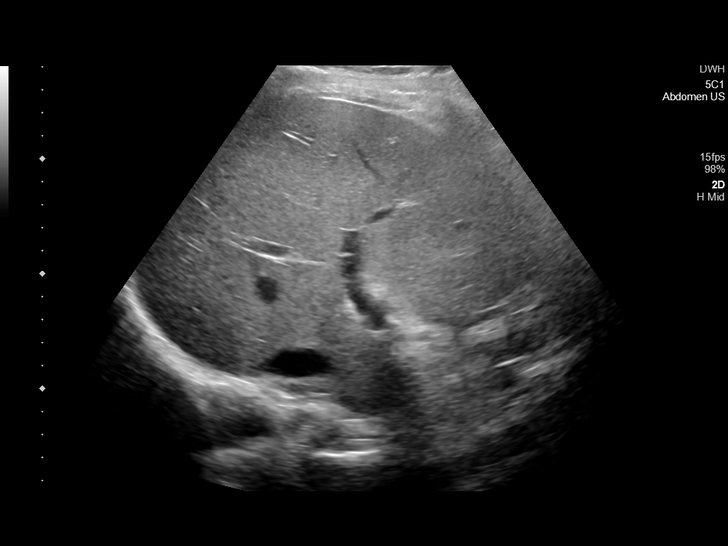
[im 15/45]
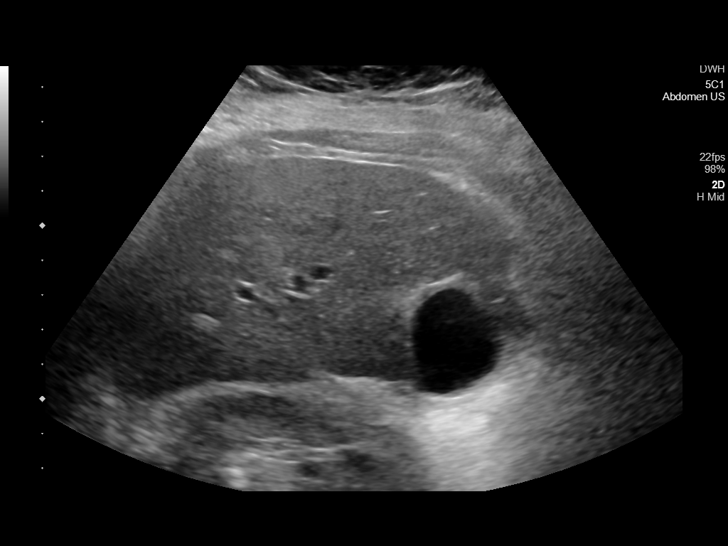
[im 17/45]
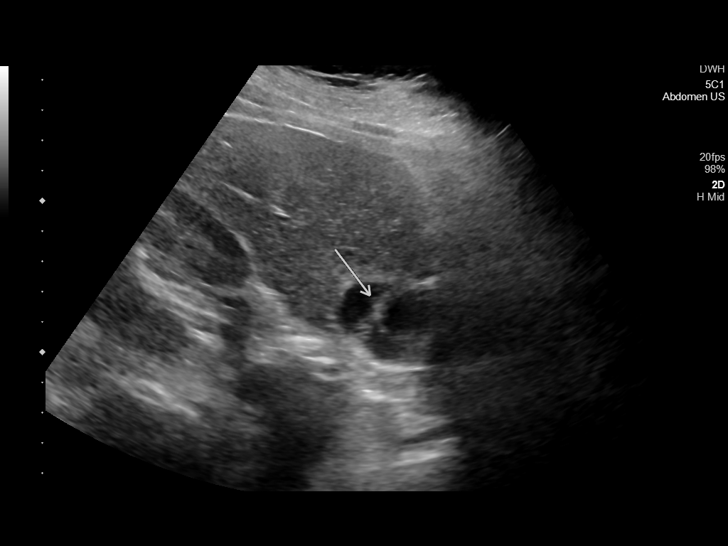
[im 21/45]
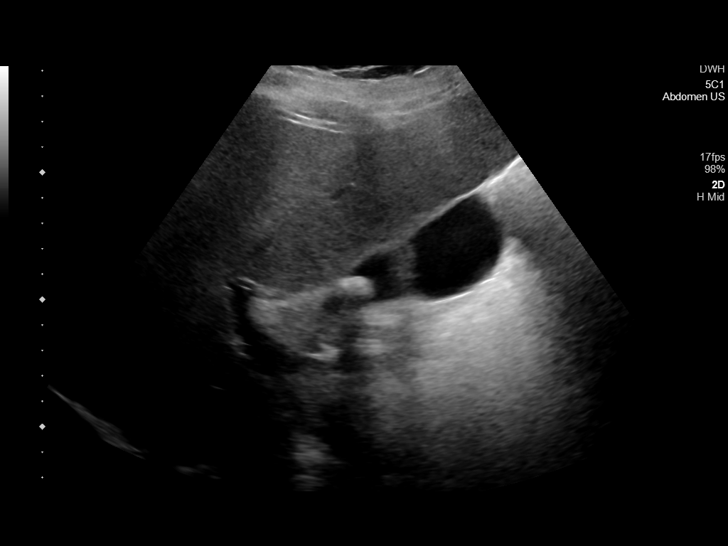
[im 24/45]
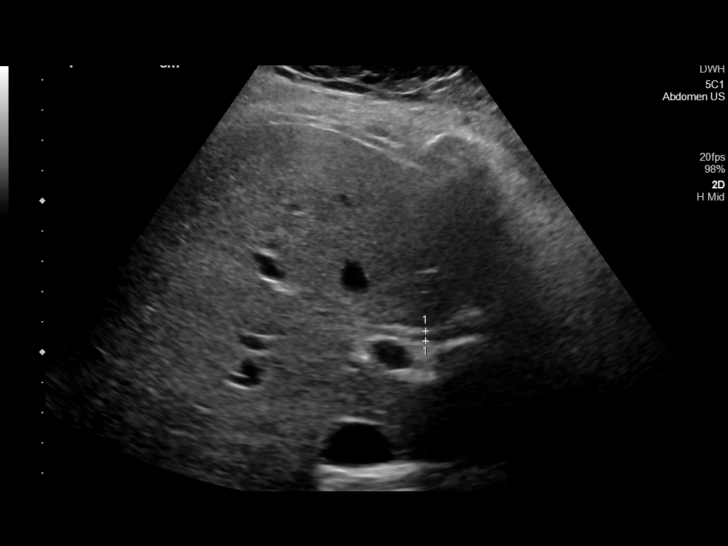
[im 28/45]
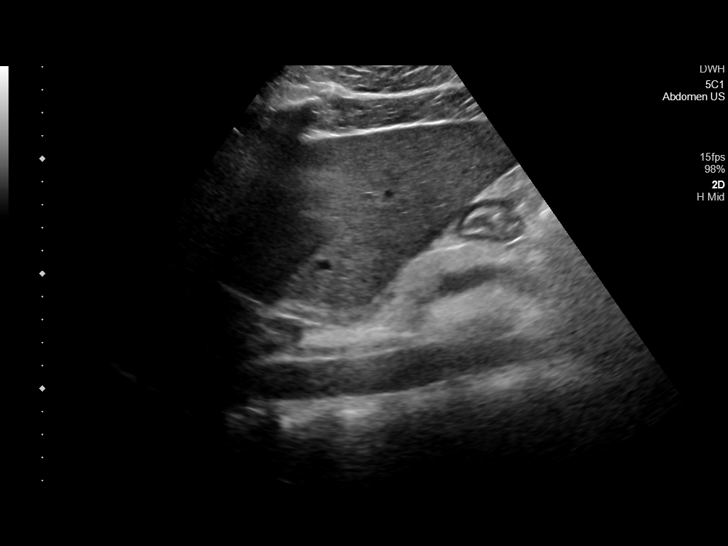
[im 30/45]
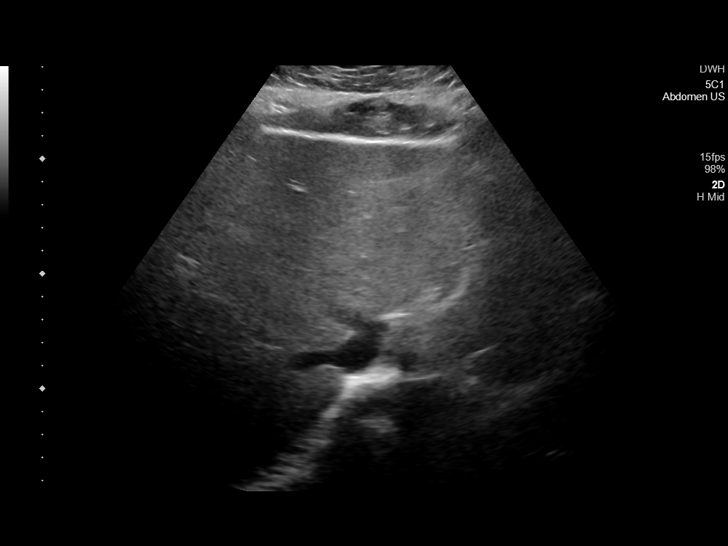
[im 34/45]
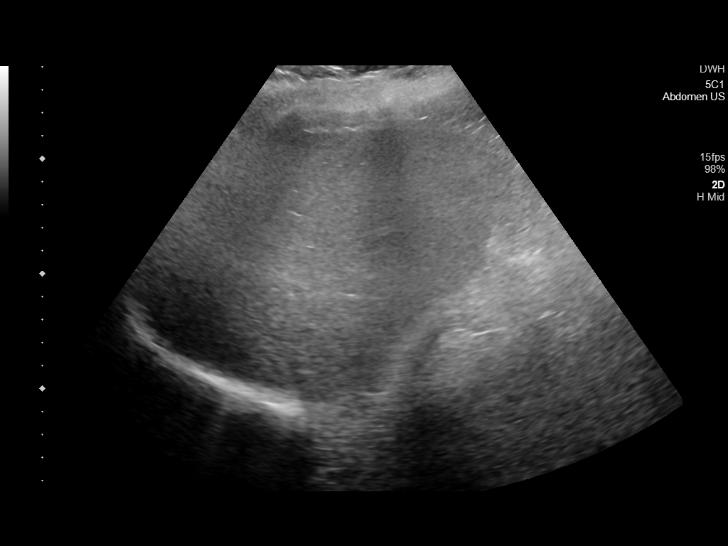
[im 37/45]
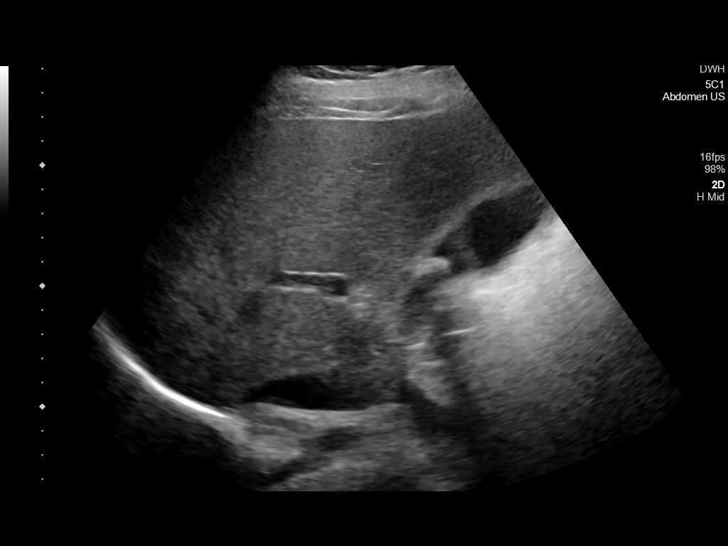
[im 41/45]
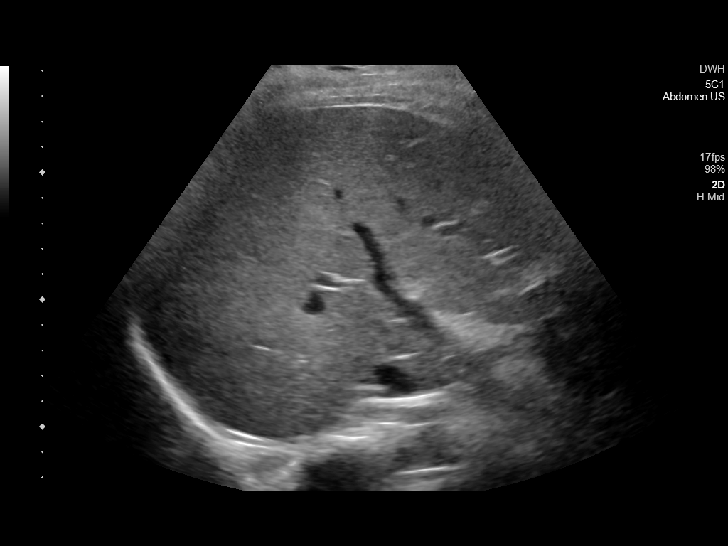
[im 45/45]
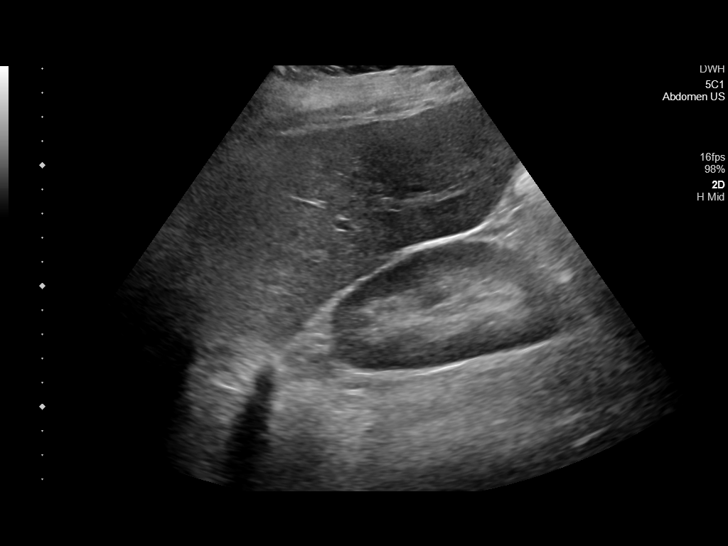

[14 of 25 positions shown; findings below may reference images not displayed]

FINDINGS: Gallbladder:

Gallbladder is well distended. Gallstones are noted within the
gallbladder. No wall thickening is noted. Negative sonographic
Murphy's sign is seen. Echogenic focus is noted adjacent to the
gallbladder consistent with the given clinical history of
percutaneous cholecystostomy although the tube does not appear to
lie within the gallbladder.

Common bile duct:

Diameter: 3.3 mm.

Liver:

No focal lesion identified. Within normal limits in parenchymal
echogenicity. Portal vein is patent on color Doppler imaging with
normal direction of blood flow towards the liver.

Other: None.
IMPRESSION: Drainage catheter in the right upper quadrant felt to be related to
a percutaneous cholecystostomy tube. Clinical correlation is
recommended. The catheter may lie extrinsic to the gallbladder
lumen. Injection may be helpful when able.

Cholelithiasis.

## 2019-12-05 MED ORDER — IBUPROFEN 400 MG PO TABS: 400.0000 mg | ORAL_TABLET | Freq: Four times a day (QID) | ORAL | Status: DC | PRN

## 2019-12-05 MED ORDER — GADOBUTROL 1 MMOL/ML IV SOLN
7.5000 mL | Freq: Once | INTRAVENOUS | Status: AC | PRN
  Administered 2019-12-05: 7.5 mL via INTRAVENOUS

## 2019-12-05 MED ORDER — MIRTAZAPINE 15 MG PO TABS: 7.5000 mg | ORAL_TABLET | Freq: Every day | ORAL | Status: DC

## 2019-12-05 MED ORDER — SODIUM CHLORIDE 0.9 % IV BOLUS
1000.0000 mL | Freq: Once | INTRAVENOUS | Status: AC
  Administered 2019-12-05: 1000 mL via INTRAVENOUS

## 2019-12-05 MED ORDER — MORPHINE SULFATE (PF) 4 MG/ML IV SOLN
4.0000 mg | INTRAVENOUS | Status: DC | PRN
  Administered 2019-12-05: 4 mg via INTRAVENOUS
  Filled 2019-12-05: qty 1

## 2019-12-05 MED ORDER — NICOTINE 21 MG/24HR TD PT24
21.0000 mg | MEDICATED_PATCH | Freq: Every day | TRANSDERMAL | Status: DC
  Administered 2019-12-05: 21 mg via TRANSDERMAL
  Filled 2019-12-05: qty 1

## 2019-12-05 MED ORDER — ONDANSETRON HCL 4 MG/2ML IJ SOLN
4.0000 mg | Freq: Once | INTRAMUSCULAR | Status: AC
  Administered 2019-12-05: 4 mg via INTRAVENOUS
  Filled 2019-12-05: qty 2

## 2019-12-05 MED ORDER — SODIUM CHLORIDE 0.9 % IV SOLN: INTRAVENOUS | Status: DC

## 2019-12-05 MED ORDER — KETOROLAC TROMETHAMINE 15 MG/ML IJ SOLN
15.0000 mg | Freq: Four times a day (QID) | INTRAMUSCULAR | Status: DC | PRN
  Filled 2019-12-05 (×2): qty 1

## 2019-12-05 MED ORDER — DULOXETINE HCL 60 MG PO CPEP
90.0000 mg | ORAL_CAPSULE | Freq: Every day | ORAL | Status: DC
  Administered 2019-12-05: 90 mg via ORAL
  Filled 2019-12-05: qty 1

## 2019-12-05 MED ORDER — GABAPENTIN 300 MG PO CAPS
1200.0000 mg | ORAL_CAPSULE | Freq: Three times a day (TID) | ORAL | Status: DC
  Administered 2019-12-05: 1200 mg via ORAL
  Filled 2019-12-05: qty 4

## 2019-12-05 MED ORDER — HYDROMORPHONE HCL 1 MG/ML IJ SOLN
0.5000 mg | INTRAMUSCULAR | Status: DC | PRN
  Administered 2019-12-05: 0.5 mg via INTRAVENOUS
  Filled 2019-12-05: qty 1

## 2019-12-05 MED ORDER — BUPRENORPHINE HCL-NALOXONE HCL 8-2 MG SL SUBL
1.0000 | SUBLINGUAL_TABLET | Freq: Three times a day (TID) | SUBLINGUAL | Status: DC
  Administered 2019-12-05: 1 via SUBLINGUAL
  Filled 2019-12-05: qty 1

## 2019-12-05 MED ORDER — VANCOMYCIN HCL 2000 MG/400ML IV SOLN
2000.0000 mg | Freq: Once | INTRAVENOUS | Status: DC
  Filled 2019-12-05: qty 400

## 2019-12-05 MED ORDER — HYDROXYZINE HCL 25 MG PO TABS
50.0000 mg | ORAL_TABLET | Freq: Four times a day (QID) | ORAL | Status: DC | PRN
  Administered 2019-12-05: 50 mg via ORAL
  Filled 2019-12-05: qty 2

## 2019-12-05 MED ORDER — VANCOMYCIN HCL IN DEXTROSE 1-5 GM/200ML-% IV SOLN: 1000.0000 mg | Freq: Three times a day (TID) | INTRAVENOUS | Status: DC

## 2019-12-05 MED ORDER — BACLOFEN 10 MG PO TABS
10.0000 mg | ORAL_TABLET | Freq: Three times a day (TID) | ORAL | Status: DC | PRN
  Filled 2019-12-05: qty 1

## 2019-12-05 MED ORDER — ACETAMINOPHEN 325 MG PO TABS: 650.0000 mg | ORAL_TABLET | Freq: Four times a day (QID) | ORAL | Status: DC | PRN

## 2019-12-05 MED ORDER — HYDROMORPHONE HCL 1 MG/ML IJ SOLN
1.0000 mg | INTRAMUSCULAR | Status: DC | PRN
  Administered 2019-12-05: 1 mg via INTRAVENOUS
  Filled 2019-12-05: qty 1

## 2019-12-05 MED ORDER — OXYCODONE-ACETAMINOPHEN 5-325 MG PO TABS
2.0000 | ORAL_TABLET | Freq: Four times a day (QID) | ORAL | Status: DC | PRN
  Administered 2019-12-05: 2 via ORAL
  Filled 2019-12-05: qty 2

## 2019-12-05 MED ORDER — LORAZEPAM 2 MG/ML IJ SOLN: 0.5000 mg | Freq: Four times a day (QID) | INTRAMUSCULAR | Status: DC | PRN

## 2019-12-05 MED ORDER — IOHEXOL 300 MG/ML  SOLN
100.0000 mL | Freq: Once | INTRAMUSCULAR | Status: AC | PRN
  Administered 2019-12-05: 100 mL via INTRAVENOUS

## 2019-12-05 MED ORDER — LORAZEPAM 2 MG/ML IJ SOLN
0.5000 mg | Freq: Once | INTRAMUSCULAR | Status: AC
  Administered 2019-12-05: 0.5 mg via INTRAVENOUS
  Filled 2019-12-05: qty 1

## 2019-12-05 MED ORDER — ONDANSETRON HCL 4 MG/2ML IJ SOLN: 4.0000 mg | Freq: Three times a day (TID) | INTRAMUSCULAR | Status: DC | PRN

## 2019-12-05 NOTE — Discharge Summary (Signed)
Physician Discharge Summary  Leslie Molina KWI:[id number] DOB: [birth year] DOA: 12/05/2019  PCP: [facility] [place] date: 12/05/2019 Discharge date: 12/05/2019  Recommendations for Outpatient Follow-up:  -none since patient left hospital [place]: None Equipment/Devices: None  Discharge Condition: Stable CODE STATUS: Full code Diet recommendation: Regular  Brief/Interim Summary (HPI) Leslie Molina is a 34 y.o. 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 Pleural fluid culture with MRSA), recent acute cholecystitis, s/p of percutaneous drainage tube placement in [facility], who presents with abdominal pain and right hip pain.  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 She left hospital on AMA. She was then admitted to [facility] due to acute cholecystitis 4/11. She is s/p of percutaneous drainage tube placement. The draining tube is still in place, but no fluid coming out per pt.   Patient states that her abdominal pain has been worsening the past several days, which is located in the right upper quadrant, constant, 10 out of 10 severity, sharp, nonradiating.  She does not have nausea, vomiting, diarrhea.  No fever or chills.  She also has worsening right hip pain, which is constant, sharp, 10 out of 10 severity, nonradiating.  She denies chest pain, shortness breath, cough.  She denies symptoms of UTI.  No unilateral weakness.   ED Course: pt was found to have WBC 6.9, negative Covid PCR, positive urinalysis (hazy  appearance, trace amount of leukocyte, positive nitrite, rare bacteria, WBC 21-50), negative pregnancy test, temperature normal, blood pressure 105/93, heart rate 89, RR 20, oxygen saturation 98% on room air, renal function normal. Pt is admitted to MedSurg bed as inpatient.  General surgery is consulted.  US-RUQ showed: 1. Drainage catheter in the right upper quadrant felt to be related to a percutaneous cholecystostomy tube. The catheter may lie extrinsic to the gallbladder lumen. Injection may be helpful when able. 2. Cholelithiasis.   CT-abd/pelvis showed: 1. Cholelithiasis and evidence of prior percutaneous cholecystostomy tube placement which now is displaced adjacent to the fundus of the gallbladder. Injection may be helpful to assess catheter position. A small amount of fluid is noted surrounding the catheter which likely represent some bile leakage. 2. Misplaced IUD as described which may be related to the Caesarean scar. 3. Increased sclerosis in the right sacroiliac joint with widening of the joint similar to that seen on recent MRI examination.   MRI_pelvis showed: 1. Findings consistent ongoing extensive septic arthritis and osteomyelitis of the right sacroiliac joint with a small joint effusion, however slightly improved in appearance from the prior exam. 2. new small heterogeneous collection within the right piriformis measuring 3.0 x 1.1 x 2.4 cm, consistent with intramuscular abscess. 3. Slightly improved appearance of the myositis involving the gluteal, piriformis, and iliopsoas musculature.    Subjective  - Abdominal pain and right hip pain  Discharge Diagnoses and Hospital Course:   Principal Problem:   Abdominal pain Active Problems:   Abscess_Intramuscular abscess in right piriformis muscle   Right hip pain   Anxiety and depression   Cholecystitis   Tobacco abuse   Polysubstance abuse (HCC)   Abdominal pain and recent cholecystitis:  pt is s/p of  percutaneous drainage. CT scan showed prior percutaneous cholecystostomy tube placement which now is displaced adjacent to the fundus of gallbladder.  General surgery is consulted --> pull percutaneous cholecystostomy tube today and follow with general surgery and to [facility] to MedSurg bed as inpatient -Pain control: As needed Percocet, ibuprofen, ketorolac, Tylenol. 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 MRI today showed right piriformis measuring 3.0 x 1.1 x 2.4 cm, consistent with intramuscular abscess.  Patient's not septic.  No fever or leukocytosis.  Hemodynamically stable.  Will hold antibiotics.  Ortho is consulted, if I&D can be performed, will follow up culture and then start appropriate antibiotics. -Pain control as above -prn baclofen -Follow-up with surgeons recommendation, -check ERS and CRP  Addendum: per Dr. Rudene Christians, pt dose not need I&D now.  -will start Vancomycine  Anxiety and depression: -Cymbalta, hydroxyzine, Remeron  Tobacco abuse: -nicotine patch  Polysubstance abuse ([place]):  -check UDS -continue Suboxone  Positive UA: Patient denies symptoms of UTI. -f/u Urine culture and hold off abx    Discharge Instructions -none  Allergies as of 12/05/2019   No Known Allergies   Med Rec must be completed prior to using this [place]       No Known Allergies  Consultations:  General surgeon and orthopedic surgeon   Procedures/Studies: MR PELVIS W WO CONTRAST  Result Date: 12/05/2019 CLINICAL DATA:  Right hip pain, worsening, treatment for 4 months of MRSA infection EXAM: MRI PELVIS WITHOUT AND WITH CONTRAST TECHNIQUE: Multiplanar multisequence MR imaging of the pelvis was performed both before and after administration of intravenous contrast. CONTRAST:  7.3mL GADAVIST GADOBUTROL 1 MMOL/ML IV  SOLN COMPARISON:  CT same day, MRI Oct 10, 2019 FINDINGS: Bones: Again noted is patchy increased T2 hyperintense/T1 hypointense signal seen throughout the right sacroiliac joint, slightly improved in appearance than the prior exam. There is however increasing sclerosis seen around the sacroiliac joint. There is erosive type change seen at the sacroiliac joint with a small amount of joint fluid which also appears to be slightly improved from the prior exam. No osseous fracture is seen. The remainder of the osseous structures are intact. Articular cartilage and labrum Articular cartilage: No focal chondral defect or subchondral signal abnormality identified. The ligamentum teres is intact Labrum: There is no gross labral tear or paralabral abnormality. Joint or bursal effusion Joint effusion: Again noted is heterogeneous fluid within the right sacroiliac joint, slightly improved since the prior exam. No hip joint effusion is noted. Bursae: No focal periarticular fluid collection. Muscles and tendons Muscles and tendons: Increased feathery T2 hyperintense signal seen throughout the gluteal musculature, iliopsoas, erector spine a and within the piriformis. There is a there appears to be a new collection within the piriformis musculature measuring approximately 3.0 x 1.1 by 2.4 cm, consistent with intramuscular abscess. There is extensive heterogeneous signal seen within the remainder of the piriformis musculature. Other findings Miscellaneous: The visualized internal pelvic contents appear unremarkable. IMPRESSION: 1. Findings consistent ongoing extensive septic arthritis and osteomyelitis of the right sacroiliac joint with a small joint effusion, however slightly improved in appearance from the prior exam. 2. new small heterogeneous collection within the right piriformis measuring 3.0 x 1.1 x 2.4 cm, consistent with intramuscular abscess. 3. Slightly improved appearance of the myositis involving the gluteal,  piriformis, and iliopsoas musculature. Electronically Signed  By: Prudencio Pair M.D.   On: 12/05/2019 08:11   CT ABDOMEN PELVIS W CONTRAST  Result Date: 12/05/2019 CLINICAL DATA:  Increasing abdominal pain EXAM: CT ABDOMEN AND PELVIS WITH CONTRAST TECHNIQUE: Multidetector CT imaging of the abdomen and pelvis was performed using the standard protocol following bolus administration of intravenous contrast. CONTRAST:  146mL OMNIPAQUE IOHEXOL 300 MG/ML  SOLN COMPARISON:  Ultrasound from earlier in the same day as well as 10/13/2019, MRI from 10/10/2019 FINDINGS: Lower chest: Within normal limits. Hepatobiliary: Liver is mildly fatty infiltrated. Gallbladder is partially distended. Known cholelithiasis is not as well visualized as on the recent ultrasound examination due to poor calcification. There is a right upper quadrant catheter identified adjacent to the gallbladder but not within the gallbladder which likely represents a displaced percutaneous cholecystostomy tube. Correlate with any drainage. Injection may be helpful as well. Pancreas: Unremarkable. No pancreatic ductal dilatation or surrounding inflammatory changes. Spleen: Normal in size without focal abnormality. Adrenals/Urinary Tract: Adrenal glands are within normal limits. Kidneys demonstrate a normal enhancement pattern bilaterally. No renal calculi or obstructive changes are seen. Bladder is well distended. Stomach/Bowel: The appendix is within normal limits. Colon shows no obstructive or inflammatory changes. Small bowel is unremarkable. Stomach is within normal limits. Vascular/Lymphatic: No significant vascular findings are present. No enlarged abdominal or pelvic lymph nodes. Reproductive: Uterus is within normal limits. An IUD is noted in place but is significantly displaced to the left with at least one of the arms extending to the margin of the uterus on the left and possibly into the peritoneal cavity. Clinical correlation is recommended. This  may knee removal and replacement. It is possible the displacement is related to the prior Caesarean scar. Other: No abdominal wall hernia or abnormality. No abdominopelvic ascites. Musculoskeletal: No acute bony abnormality is noted. Increased sclerosis is noted involving the right sacroiliac joint this is similar to that seen on prior MRI examination. IMPRESSION: Cholelithiasis and evidence of prior percutaneous cholecystostomy tube placement which now is displaced adjacent to the fundus of the gallbladder. Injection may be helpful to assess catheter position. A small amount of fluid is noted surrounding the catheter which likely represent some bile leakage. Misplaced IUD as described which may be related to the Caesarean scar. Increased sclerosis in the right sacroiliac joint with widening of the joint similar to that seen on recent MRI examination. Electronically Signed   By: Inez Catalina M.D.   On: 12/05/2019 03:43   US ABDOMEN LIMITED RUQ  Result Date: 12/05/2019 CLINICAL DATA:  Right upper quadrant pain common no known right upper quadrant drainage EXAM: ULTRASOUND ABDOMEN LIMITED RIGHT UPPER QUADRANT COMPARISON:  CT from 08/10/2019 FINDINGS: Gallbladder: Gallbladder is well distended. Gallstones are noted within the gallbladder. No wall thickening is noted. Negative sonographic Murphy's sign is seen. Echogenic focus is noted adjacent to the gallbladder consistent with the given clinical history of percutaneous cholecystostomy although the tube does not appear to lie within the gallbladder. Common bile duct: Diameter: 3.3 mm. Liver: No focal lesion identified. Within normal limits in parenchymal echogenicity. Portal vein is patent on color Doppler imaging with normal direction of blood flow towards the liver. Other: None. IMPRESSION: Drainage catheter in the right upper quadrant felt to be related to a percutaneous cholecystostomy tube. Clinical correlation is recommended. The catheter may lie extrinsic to  the gallbladder lumen. Injection may be helpful when able. Cholelithiasis. Electronically Signed   By: Inez Catalina M.D.   On: 12/05/2019 03:37  Discharge Exam: Vitals:   12/05/19 1559 12/05/19 1630  BP: 108/68 117/70  Pulse: 77 80  Resp: 18 18  Temp: 98 F (36.7 C) 98.1 F (36.7 C)  SpO2: 97% 99%   Vitals:   12/05/19 0500 12/05/19 0547 12/05/19 1559 12/05/19 1630  BP: (!) 100/58 (!) 105/93 108/68 117/70  Pulse: 76 89 77 80  Resp: 20 20 18 18   Temp:   98 F (36.7 C) 98.1 F (36.7 C)  TempSrc:   Oral Oral  SpO2: 97% 98% 97% 99%  Weight:      Height:        Physical examination was not performed since I did not know patient left hospital on [place] until later on.   The results of significant diagnostics from this hospitalization (including imaging, microbiology, ancillary and laboratory) are listed below for reference.     Microbiology: Recent Results (from the past 240 hour(s))  SARS Coronavirus 2 by RT PCR (hospital order, performed in [facility] hospital lab) Nasopharyngeal Nasopharyngeal Swab     Status: None   Collection Time: 12/05/19  6:38 AM   Specimen: Nasopharyngeal Swab  Result Value Ref Range Status   SARS Coronavirus 2 NEGATIVE NEGATIVE Final    Comment: (NOTE) SARS-CoV-2 target nucleic acids are NOT DETECTED.  The SARS-CoV-2 RNA is generally detectable in upper and lower respiratory specimens during the acute phase of infection. The lowest concentration of SARS-CoV-2 viral copies this assay can detect is 250 copies / mL. A negative result does not preclude SARS-CoV-2 infection and should not be used as the sole basis for treatment or other patient management decisions.  A negative result may occur with improper specimen collection / handling, submission of specimen other than nasopharyngeal swab, presence of viral mutation(s) within the areas targeted by this assay, and inadequate number of viral copies (<250 copies / mL). A negative result must  be combined with clinical observations, patient history, and epidemiological information.  Fact Sheet for Patients:   StrictlyIdeas.no  Fact Sheet for Healthcare Providers: BankingDealers.co.za  This test is not yet approved or  cleared by the [place] FDA and has been authorized for detection and/or diagnosis of SARS-CoV-2 by FDA under an Emergency Use Authorization (EUA).  This EUA will remain in effect (meaning this test can be used) for the duration of the COVID-19 declaration under Section 564(b)(1) of the Act, 21 U.S.C. section 360bbb-3(b)(1), unless the authorization is terminated or revoked sooner.  Performed at [facility], [address] [place], [place] [address]      Labs: BNP (last 3 results) No results for input(s): BNP in the last 8760 hours. Basic Metabolic Panel: Recent Labs  Lab 12/04/19 1958  NA 140  K 3.5  CL 104  CO2 26  GLUCOSE 126*  BUN 8  CREATININE 0.67  CALCIUM 8.9   Liver Function Tests: Recent Labs  Lab 12/04/19 1958  AST 21  ALT 29  ALKPHOS 164*  BILITOT 0.4  PROT 8.2*  ALBUMIN 3.9   Recent Labs  Lab 12/04/19 1958  LIPASE 43   No results for input(s): AMMONIA in the last 168 hours. CBC: Recent Labs  Lab 12/04/19 1958  WBC 6.9  HGB 14.2  HCT 42.8  MCV 77.7*  PLT 288   Cardiac Enzymes: No results for input(s): CKTOTAL, CKMB, CKMBINDEX, TROPONINI in the last 168 hours. BNP: Invalid input(s): POCBNP CBG: No results for input(s): GLUCAP in the last 168 hours. D-Dimer No results for input(s): DDIMER in  the last 72 hours. Hgb A1c No results for input(s): HGBA1C in the last 72 hours. Lipid Profile No results for input(s): CHOL, HDL, LDLCALC, TRIG, CHOLHDL, LDLDIRECT in the last 72 hours. Thyroid function studies No results for input(s): TSH, T4TOTAL, T3FREE, THYROIDAB in the last 72 hours.  Invalid input(s): FREET3 Anemia work up No results for  input(s): VITAMINB12, FOLATE, FERRITIN, TIBC, IRON, RETICCTPCT in the last 72 hours. Urinalysis    Component Value Date/Time   COLORURINE YELLOW (A) 12/04/2019 1957   APPEARANCEUR HAZY (A) 12/04/2019 1957   APPEARANCEUR Clear 04/03/2014 0059   LABSPEC 1.021 12/04/2019 1957   LABSPEC 1.016 04/03/2014 0059   PHURINE 5.0 12/04/2019 1957   GLUCOSEU NEGATIVE 12/04/2019 1957   GLUCOSEU Negative 04/03/2014 0059   HGBUR NEGATIVE 12/04/2019 1957   BILIRUBINUR NEGATIVE 12/04/2019 1957   BILIRUBINUR Negative 04/03/2014 0059   KETONESUR NEGATIVE 12/04/2019 1957   PROTEINUR NEGATIVE 12/04/2019 1957   NITRITE POSITIVE (A) 12/04/2019 1957   LEUKOCYTESUR TRACE (A) 12/04/2019 1957   LEUKOCYTESUR Negative 04/03/2014 0059   Sepsis Labs Invalid input(s): PROCALCITONIN,  WBC,  LACTICIDVEN Microbiology Recent Results (from the past 240 hour(s))  SARS Coronavirus 2 by RT PCR (hospital order, performed in [place] hospital lab) Nasopharyngeal Nasopharyngeal Swab     Status: None   Collection Time: 12/05/19  6:38 AM   Specimen: Nasopharyngeal Swab  Result Value Ref Range Status   SARS Coronavirus 2 NEGATIVE NEGATIVE Final    Comment: (NOTE) SARS-CoV-2 target nucleic acids are NOT DETECTED.  The SARS-CoV-2 RNA is generally detectable in upper and lower respiratory specimens during the acute phase of infection. The lowest concentration of SARS-CoV-2 viral copies this assay can detect is 250 copies / mL. A negative result does not preclude SARS-CoV-2 infection and should not be used as the sole basis for treatment or other patient management decisions.  A negative result may occur with improper specimen collection / handling, submission of specimen other than nasopharyngeal swab, presence of viral mutation(s) within the areas targeted by this assay, and inadequate number of viral copies (<250 copies / mL). A negative result must be combined with clinical observations, patient history, and  epidemiological information.  Fact Sheet for Patients:   StrictlyIdeas.no  Fact Sheet for Healthcare Providers: BankingDealers.co.za  This test is not yet approved or  cleared by the [place] FDA and has been authorized for detection and/or diagnosis of SARS-CoV-2 by FDA under an Emergency Use Authorization (EUA).  This EUA will remain in effect (meaning this test can be used) for the duration of the COVID-19 declaration under Section 564(b)(1) of the Act, 21 U.S.C. section 360bbb-3(b)(1), unless the authorization is terminated or revoked sooner.  Performed at [facility], [address], [place], [place] [address]     Time coordinating discharge:  25 minutes.  SIGNED:  Ivor Costa, MD [facility] 12/05/2019, 7:08 PM   If 7PM-7AM, please contact night-coverage [email or web address]

## 2019-12-05 NOTE — ED Notes (Signed)
Patient appropriate and cooperative. NAD noted  

## 2019-12-05 NOTE — H&P (Addendum)
History and Physical    Leslie Molina DGU:[id number] DOB: 02/05/1986 DOA: 12/05/2019  Referring MD/NP/PA:   PCP: [facility]   Patient coming from:  The patient is coming from home.  At baseline, pt is independent for most of ADL.        Chief Complaint: Abdominal pain and right hip pain  HPI: Leslie Molina is a 34 y.o. 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 Pleural fluid culture with MRSA), recent acute cholecystitis, s/p of percutaneous drainage tube placement in [facility], who presents with abdominal pain and right hip pain.  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 She left hospital on AMA. She was then admitted to [facility] due to acute cholecystitis 4/11. She is s/p of percutaneous drainage tube placement. The draining tube is still in place, but no fluid coming out per pt.   Patient states that her abdominal pain has been worsening the past several days, which is located in the right upper quadrant, constant, 10 out of 10 severity, sharp, nonradiating.  She does not have nausea, vomiting, diarrhea.  No fever or chills.  She also has worsening right hip pain, which is constant, sharp, 10 out of 10 severity, nonradiating.  She denies chest pain, shortness breath, cough.  She denies symptoms of UTI.  No unilateral weakness.   ED Course: pt was found to have WBC 6.9, negative Covid PCR, positive urinalysis (hazy appearance, trace amount of leukocyte, positive nitrite, rare bacteria, WBC 21-50), negative pregnancy  test, temperature normal, blood pressure 105/93, heart rate 89, RR 20, oxygen saturation 98% on room air, renal function normal. Pt is admitted to MedSurg bed as inpatient.  General surgery is consulted.  US-RUQ showed: 1. Drainage catheter in the right upper quadrant felt to be related to a percutaneous cholecystostomy tube. The catheter may lie extrinsic to the gallbladder lumen. Injection may be helpful when able. 2. Cholelithiasis.   CT-abd/pelvis showed: 1. Cholelithiasis and evidence of prior percutaneous cholecystostomy tube placement which now is displaced adjacent to the fundus of the gallbladder. Injection may be helpful to assess catheter position. A small amount of fluid is noted surrounding the catheter which likely represent some bile leakage. 2. Misplaced IUD as described which may be related to the Caesarean scar. 3. Increased sclerosis in the right sacroiliac joint with widening of the joint similar to that seen on recent MRI examination.   MRI_pelvis showed: 1. Findings consistent ongoing extensive septic arthritis and osteomyelitis of the right sacroiliac joint with a small joint effusion, however slightly improved in appearance from the prior exam. 2. new small heterogeneous collection within the right piriformis measuring 3.0 x 1.1 x 2.4 cm, consistent with intramuscular abscess. 3. Slightly improved appearance of the myositis involving the gluteal, piriformis, and iliopsoas musculature.  Review of Systems:   General: no fevers, chills, no body weight gain, has fatigue HEENT: no blurry vision, hearing changes or sore throat Respiratory: no dyspnea, coughing, wheezing CV: no chest pain, no palpitations GI: no nausea, vomiting, has abdominal pain, no diarrhea, constipation GU: no dysuria, burning on urination, increased urinary frequency, hematuria  Ext: no leg edema  Neuro: no unilateral weakness, numbness, or tingling, no vision change or hearing loss Skin: no rash,  no skin tear. MSK: No muscle spasm, no deformity, no limitation of range of movement in spin. Has right hip pain. Heme: No easy bruising.  Travel history: No recent long distant travel.  Allergy: No Known Allergies  Past Medical History:  Diagnosis Date  . Anxiety   . Bipolar 1 disorder ([place])   . Depression   . Migraine   . Nerve damage     Past Surgical History:  Procedure Laterality Date  . CESAREAN SECTION    . IR THORACENTESIS ASP PLEURAL SPACE W/IMG GUIDE  08/09/2019    Social History:  reports that she has been smoking cigarettes. She has been smoking about 1.00 pack per day. She has never used smokeless tobacco. She reports that she does not drink alcohol and does not use drugs.  Family History:  Family History  Problem Relation Age of Onset  . Depression Mother   . Bipolar disorder Mother   . Depression Father   . Bipolar disorder Father   . Depression Sister   . Bipolar disorder Sister      Prior to Admission medications   Medication Sig Start Date End Date Taking? Authorizing Provider  baclofen (LIORESAL) 10 MG tablet Take 10 mg by mouth 3 (three) times daily as needed. 11/07/19  Yes [provider]  clindamycin (CLEOCIN) 300 MG capsule Take 600 mg by mouth 3 (three) times daily. 11/07/19  Yes [provider]  DULoxetine (CYMBALTA) 30 MG capsule Take 90 mg by mouth daily. 11/07/19  Yes [provider]  gabapentin (NEURONTIN) 400 MG capsule Take 1,200 mg by mouth 3 (three) times daily. 11/07/19  Yes [provider]  hydrOXYzine (ATARAX/VISTARIL) 25 MG tablet Take 50 mg by mouth every 6 (six) hours as needed. 11/07/19  Yes [provider]  mirtazapine (REMERON) 7.5 MG tablet SMARTSIG:1 Tablet(s) By Mouth Every Evening 11/07/19  Yes [provider]  rifampin (RIFADIN) 300 MG capsule Take 600 mg by mouth daily. 11/07/19  Yes [provider]  SUBOXONE 8-2 MG FILM Place 1 Film under the tongue in the morning, at noon, and  at bedtime. 11/22/19  Yes [provider]  NARCAN 4 MG/0.1ML LIQD nasal spray kit as directed. 11/07/19   [provider]    Physical Exam: Vitals:   12/05/19 0213 12/05/19 0230 12/05/19 0500 12/05/19 0547  BP: 99/72 99/63 (!) 100/58 (!) 105/93  Pulse:   76 89  Resp:   20 20  Temp:      TempSrc:      SpO2:   97% 98%  Weight:      Height:       General: Not in acute distress HEENT:       Eyes: PERRL, EOMI, no scleral icterus.       ENT: No discharge from the ears and nose, no pharynx injection, no tonsillar enlargement.        Neck: No JVD, no bruit, no mass felt. Heme: No neck lymph node enlargement. Cardiac: S1/S2, RRR, No murmurs, No gallops or rubs. Respiratory: No rales, wheezing, rhonchi or rubs. GI: Soft, nondistended, has tenderness in  RUQ, s/p of PERC tube without fluid coming out, no rebound pain, no organomegaly, BS present. GU: No hematuria Ext: No pitting leg edema bilaterally. 2+DP/PT pulse bilaterally. Musculoskeletal: No joint deformities, No joint redness or warmth, no limitation of ROM in spin. Has tenderness in right hip.  Skin: No rashes.  Neuro: Alert, oriented X3, cranial nerves II-XII grossly intact, moves all extremities normally.  Psych: Patient is not psychotic, no suicidal or hemocidal ideation.  Labs on Admission: I have personally reviewed following labs and imaging studies  CBC: Recent Labs  Lab 12/04/19 1958  WBC 6.9  HGB 14.2  HCT 42.8  MCV 77.7*  PLT 678   Basic Metabolic Panel: Recent Labs  Lab 12/04/19 1958  NA 140  K 3.5  CL 104  CO2 26  GLUCOSE 126*  BUN 8  CREATININE 0.67  CALCIUM 8.9   GFR: Estimated Creatinine Clearance: 97.2 mL/min (by C-G formula based on SCr of 0.67 mg/dL). Liver Function Tests: Recent Labs  Lab 12/04/19 1958  AST 21  ALT 29  ALKPHOS 164*  BILITOT 0.4  PROT 8.2*  ALBUMIN 3.9   Recent Labs  Lab 12/04/19 1958  LIPASE 43   No results for input(s): AMMONIA in the last 168  hours. Coagulation Profile: No results for input(s): INR, PROTIME in the last 168 hours. Cardiac Enzymes: No results for input(s): CKTOTAL, CKMB, CKMBINDEX, TROPONINI in the last 168 hours. BNP (last 3 results) No results for input(s): PROBNP in the last 8760 hours. HbA1C: No results for input(s): HGBA1C in the last 72 hours. CBG: No results for input(s): GLUCAP in the last 168 hours. Lipid Profile: No results for input(s): CHOL, HDL, LDLCALC, TRIG, CHOLHDL, LDLDIRECT in the last 72 hours. Thyroid Function Tests: No results for input(s): TSH, T4TOTAL, FREET4, T3FREE, THYROIDAB in the last 72 hours. Anemia Panel: No results for input(s): VITAMINB12, FOLATE, FERRITIN, TIBC, IRON, RETICCTPCT in the last 72 hours. Urine analysis:    Component Value Date/Time   COLORURINE YELLOW (A) 12/04/2019 1957   APPEARANCEUR HAZY (A) 12/04/2019 1957   APPEARANCEUR Clear 04/03/2014 0059   LABSPEC 1.021 12/04/2019 1957   LABSPEC 1.016 04/03/2014 0059   PHURINE 5.0 12/04/2019 1957   GLUCOSEU NEGATIVE 12/04/2019 1957   GLUCOSEU Negative 04/03/2014 0059   HGBUR NEGATIVE 12/04/2019 1957   BILIRUBINUR NEGATIVE 12/04/2019 1957   BILIRUBINUR Negative 04/03/2014 0059   KETONESUR NEGATIVE 12/04/2019 1957   PROTEINUR NEGATIVE 12/04/2019 1957   NITRITE POSITIVE (A) 12/04/2019 1957   LEUKOCYTESUR TRACE (A) 12/04/2019 1957   LEUKOCYTESUR Negative 04/03/2014 0059   Sepsis Labs: @LABRCNTIP (procalcitonin:4,lacticidven:4) ) Recent Results (from the past 240 hour(s))  SARS Coronavirus 2 by RT PCR (hospital order, performed in [place] hospital lab) Nasopharyngeal Nasopharyngeal Swab     Status: None   Collection Time: 12/05/19  6:38 AM   Specimen: Nasopharyngeal Swab  Result Value Ref Range Status   SARS Coronavirus 2 NEGATIVE NEGATIVE Final    Comment: (NOTE) SARS-CoV-2 target nucleic acids are NOT DETECTED.  The SARS-CoV-2 RNA is generally detectable in upper and lower respiratory specimens during  the acute phase of infection. The lowest concentration of SARS-CoV-2 viral copies this assay can detect is 250 copies / mL. A negative result does not preclude SARS-CoV-2 infection and should not be used as the sole basis for treatment or other patient management decisions.  A negative result may occur with improper specimen collection / handling, submission of specimen other than nasopharyngeal swab, presence of viral mutation(s) within the areas targeted by this assay, and inadequate number of viral copies (<250 copies / mL). A negative result must be combined with clinical observations, patient history, and epidemiological information.  Fact Sheet for Patients:   StrictlyIdeas.no  Fact Sheet for Healthcare Providers: BankingDealers.co.za  This test is not  yet approved or  cleared by the [place] and has been authorized for detection and/or diagnosis of SARS-CoV-2 by FDA under an Emergency Use Authorization (EUA).  This EUA will remain in effect (meaning this test can be used) for the duration of the COVID-19 declaration under Section 564(b)(1) of the Act, 21 U.S.C. section 360bbb-3(b)(1), unless the authorization is terminated or revoked sooner.  Performed at [facility], [address] [place], [place] [address]      Radiological Exams on Admission: MR PELVIS W WO CONTRAST  Result Date: 12/05/2019 CLINICAL DATA:  Right hip pain, worsening, treatment for 4 months of MRSA infection EXAM: MRI PELVIS WITHOUT AND WITH CONTRAST TECHNIQUE: Multiplanar multisequence MR imaging of the pelvis was performed both before and after administration of intravenous contrast. CONTRAST:  7.38m GADAVIST GADOBUTROL 1 MMOL/ML IV SOLN COMPARISON:  CT same day, MRI Oct 10, 2019 FINDINGS: Bones: Again noted is patchy increased T2 hyperintense/T1 hypointense signal seen throughout the right sacroiliac joint, slightly improved in appearance  than the prior exam. There is however increasing sclerosis seen around the sacroiliac joint. There is erosive type change seen at the sacroiliac joint with a small amount of joint fluid which also appears to be slightly improved from the prior exam. No osseous fracture is seen. The remainder of the osseous structures are intact. Articular cartilage and labrum Articular cartilage: No focal chondral defect or subchondral signal abnormality identified. The ligamentum teres is intact Labrum: There is no gross labral tear or paralabral abnormality. Joint or bursal effusion Joint effusion: Again noted is heterogeneous fluid within the right sacroiliac joint, slightly improved since the prior exam. No hip joint effusion is noted. Bursae: No focal periarticular fluid collection. Muscles and tendons Muscles and tendons: Increased feathery T2 hyperintense signal seen throughout the gluteal musculature, iliopsoas, erector spine a and within the piriformis. There is a there appears to be a new collection within the piriformis musculature measuring approximately 3.0 x 1.1 by 2.4 cm, consistent with intramuscular abscess. There is extensive heterogeneous signal seen within the remainder of the piriformis musculature. Other findings Miscellaneous: The visualized internal pelvic contents appear unremarkable. IMPRESSION: 1. Findings consistent ongoing extensive septic arthritis and osteomyelitis of the right sacroiliac joint with a small joint effusion, however slightly improved in appearance from the prior exam. 2. new small heterogeneous collection within the right piriformis measuring 3.0 x 1.1 x 2.4 cm, consistent with intramuscular abscess. 3. Slightly improved appearance of the myositis involving the gluteal, piriformis, and iliopsoas musculature. Electronically Signed   By: BPrudencio PairM.D.   On: 12/05/2019 08:11   CT ABDOMEN PELVIS W CONTRAST  Result Date: 12/05/2019 CLINICAL DATA:  Increasing abdominal pain EXAM: CT  ABDOMEN AND PELVIS WITH CONTRAST TECHNIQUE: Multidetector CT imaging of the abdomen and pelvis was performed using the standard protocol following bolus administration of intravenous contrast. CONTRAST:  1072mOMNIPAQUE IOHEXOL 300 MG/ML  SOLN COMPARISON:  Ultrasound from earlier in the same day as well as 10/13/2019, MRI from 10/10/2019 FINDINGS: Lower chest: Within normal limits. Hepatobiliary: Liver is mildly fatty infiltrated. Gallbladder is partially distended. Known cholelithiasis is not as well visualized as on the recent ultrasound examination due to poor calcification. There is a right upper quadrant catheter identified adjacent to the gallbladder but not within the gallbladder which likely represents a displaced percutaneous cholecystostomy tube. Correlate with any drainage. Injection may be helpful as well. Pancreas: Unremarkable. No pancreatic ductal dilatation or surrounding inflammatory changes. Spleen: Normal in size without focal  abnormality. Adrenals/Urinary Tract: Adrenal glands are within normal limits. Kidneys demonstrate a normal enhancement pattern bilaterally. No renal calculi or obstructive changes are seen. Bladder is well distended. Stomach/Bowel: The appendix is within normal limits. Colon shows no obstructive or inflammatory changes. Small bowel is unremarkable. Stomach is within normal limits. Vascular/Lymphatic: No significant vascular findings are present. No enlarged abdominal or pelvic lymph nodes. Reproductive: Uterus is within normal limits. An IUD is noted in place but is significantly displaced to the left with at least one of the arms extending to the margin of the uterus on the left and possibly into the peritoneal cavity. Clinical correlation is recommended. This may knee removal and replacement. It is possible the displacement is related to the prior Caesarean scar. Other: No abdominal wall hernia or abnormality. No abdominopelvic ascites. Musculoskeletal: No acute bony  abnormality is noted. Increased sclerosis is noted involving the right sacroiliac joint this is similar to that seen on prior MRI examination. IMPRESSION: Cholelithiasis and evidence of prior percutaneous cholecystostomy tube placement which now is displaced adjacent to the fundus of the gallbladder. Injection may be helpful to assess catheter position. A small amount of fluid is noted surrounding the catheter which likely represent some bile leakage. Misplaced IUD as described which may be related to the Caesarean scar. Increased sclerosis in the right sacroiliac joint with widening of the joint similar to that seen on recent MRI examination. Electronically Signed   By: Inez Catalina M.D.   On: 12/05/2019 03:43   US ABDOMEN LIMITED RUQ  Result Date: 12/05/2019 CLINICAL DATA:  Right upper quadrant pain common no known right upper quadrant drainage EXAM: ULTRASOUND ABDOMEN LIMITED RIGHT UPPER QUADRANT COMPARISON:  CT from 08/10/2019 FINDINGS: Gallbladder: Gallbladder is well distended. Gallstones are noted within the gallbladder. No wall thickening is noted. Negative sonographic Murphy's sign is seen. Echogenic focus is noted adjacent to the gallbladder consistent with the given clinical history of percutaneous cholecystostomy although the tube does not appear to lie within the gallbladder. Common bile duct: Diameter: 3.3 mm. Liver: No focal lesion identified. Within normal limits in parenchymal echogenicity. Portal vein is patent on color Doppler imaging with normal direction of blood flow towards the liver. Other: None. IMPRESSION: Drainage catheter in the right upper quadrant felt to be related to a percutaneous cholecystostomy tube. Clinical correlation is recommended. The catheter may lie extrinsic to the gallbladder lumen. Injection may be helpful when able. Cholelithiasis. Electronically Signed   By: Inez Catalina M.D.   On: 12/05/2019 03:37     EKG: Independently reviewed.  Not done in ED, will get one.     Assessment/Plan Principal Problem:   Abdominal pain Active Problems:   Abscess_Intramuscular abscess in right piriformis muscle   Right hip pain   Anxiety and depression   Cholecystitis   Tobacco abuse   Polysubstance abuse ([facility])   Abdominal pain and recent cholecystitis: pt is s/p of percutaneous drainage. CT scan showed prior percutaneous cholecystostomy tube placement which now is displaced adjacent to the fundus of gallbladder.  General surgery is consulted.  -Admit to MedSurg bed as inpatient -Pain control: As needed Percocet, ibuprofen, ketorolac, Tylenol. Pt also on gabapentin -continue home Suboxone -f/u surgeon's recommendation.  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 MRI today showed right piriformis measuring 3.0 x 1.1 x 2.4 cm, consistent with intramuscular abscess.  Patient's not septic.  No fever or leukocytosis.  Hemodynamically stable.  Will hold antibiotics.  Ortho is consulted, if I&D can be performed, will follow up culture and then start appropriate antibiotics. -Pain control as above -prn baclofen -Follow-up with surgeons recommendation, -check ERS and CRP  Addendum: per Dr. Rudene Christians, pt dose not need I&D now.  -will start Vancomycine  Anxiety and depression: -Cymbalta, hydroxyzine, Remeron  Tobacco abuse: -nicotine patch  Polysubstance abuse ([place]):  -check UDS -continue Suboxone  Positive UA: Patient denies symptoms of UTI. -f/u Urine culture and hold off abx     DVT ppx: SQ Heparin   Code Status: Full code Family Communication: not done, no family member is at bed side.     Disposition Plan:  Anticipate discharge back to previous environment Consults called: General surgery is consulted. Dr.Menz of ortho is consulted Admission status: Med-surg bed as inpt      Status is: Inpatient  Remains inpatient appropriate because:Inpatient level of care appropriate due to severity  of illness.  Patient is multiple comorbidities, now presents with abdominal pain, right hip pain.  CT scan showed showed prior percutaneous cholecystostomy tube placement which now is displaced adjacent to the fundus of gallbladder. MRI-pelvis showed 3 cm piriformis abscess.     Dispo: The patient is from: Home              Anticipated d/c is to: Home              Anticipated d/c date is: 2 days              Patient currently is not medically stable to d/c.          Date of Service 12/05/2019    Ivor Costa [facility]   If 7PM-7AM, please contact night-coverage [email or web address] 12/05/2019, 10:37 AM

## 2019-12-05 NOTE — ED Notes (Signed)
Patient went home AMA says she will go to [facility] and have the surgery performed. Patient got belongings and verbalized he has received all of his belongings. Patient appropriate and cooperative. Vital signs taken. NAD noted.

## 2019-12-05 NOTE — Consult Note (Addendum)
SUR[facility]ICAL ASSOC[facility]ATES SURGICAL CONSULTATION NOTE (initial) - cpt: [id number]   HISTORY OF PRESENT ILLNESS (HPI):  34 y.o. female presented to [facility] ED yesterday for evaluation of abdominal pain. 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 She was treated with cefepime (4/11-4/14) and ceftriaxone courses (4/14-4/19). She has NOT followed up at [facility] for this. Patient reports that in the last few days her abdominal pain had been worsening. This pain is primarily in the RUQ, described as sharp in nature, and 10/10 severity. This does not radiate anywhere. Despite her reports of severe pain she is "asking when she can have food." No fever, chills, cough, CP, SOB, nausea, emesis, or bowel changes. She has noticed drainage from her percutaneous cholecystotomy has ceased in the last 2 weeks. Laboratory work up in the ED revealed a UTI but was otherwise unremarkable. CT Abdomen/Pelvis suggests that percutaneous cholecystostomy tube lying outside the gallbladder. MRI of the pelvis was also obtained to re-evaluate her history of septic arthritis. She was admitted to medicine service.   Surgery is consulted by emergency medicine physician Dr. Merlyn Lot, MD in this context for evaluation and management of dislodged percutaneous cholecystostomy tube.   PAST MEDICAL HISTORY (PMH):  Past Medical History:  Diagnosis Date  . Anxiety   . Bipolar 1 disorder ([place])   . Depression   . Migraine   . Nerve damage      PAST SURGICAL HISTORY ([place]):  Past Surgical History:  Procedure Laterality Date  . CESAREAN SECTION    . IR THORACENTESIS ASP PLEURAL SPACE W/IMG  GUIDE  08/09/2019     MEDICATIONS:  Prior to Admission medications   Medication Sig Start Date End Date Taking? Authorizing Provider  baclofen (LIORESAL) 10 MG tablet Take 10 mg by mouth 3 (three) times daily as needed. 11/07/19  Yes [provider]  clindamycin (CLEOCIN) 300 MG capsule Take 600 mg by mouth 3 (three) times daily. 11/07/19  Yes [provider]  DULoxetine (CYMBALTA) 30 MG capsule Take 90 mg by mouth daily. 11/07/19  Yes [provider]  gabapentin (NEURONTIN) 400 MG capsule Take 1,200 mg by mouth 3 (three) times daily. 11/07/19  Yes [provider]  hydrOXYzine (ATARAX/VISTARIL) 25 MG tablet Take 50 mg by mouth every 6 (six) hours as needed. 11/07/19  Yes [provider]  mirtazapine (REMERON) 7.5 MG tablet SMARTSIG:1 Tablet(s) By Mouth Every Evening 11/07/19  Yes [provider]  rifampin (RIFADIN) 300 MG capsule Take 600 mg by mouth daily. 11/07/19  Yes [provider]  SUBOXONE 8-2 MG FILM Place 1 Film under the tongue in the morning, at noon, and at bedtime. 11/22/19  Yes [provider]  NARCAN 4 MG/0.1ML LIQD nasal spray kit as directed. 11/07/19   [provider]     ALLERGIES:  No Known Allergies   SOCIAL HISTORY:  Social History   Socioeconomic History  . Marital status: Single    Spouse name: Not on file  . Number of children: Not on file  . Years of education: Not on file  . Highest education level: Not on file  Occupational History  . Not on file  Tobacco Use  . Smoking  status: Current Every Day Smoker    Packs/day: 1.00    Types: Cigarettes  . Smokeless tobacco: Never Used  Substance and Sexual Activity  . Alcohol use: No    Alcohol/week: 0.0 standard drinks  . Drug use: No  . Sexual activity: Not on file  Other Topics Concern  . Not on file  Social History Narrative  . Not on file   Social Determinants of Health   Financial Resource Strain:   . Difficulty of Paying Living  Expenses:   Food Insecurity:   . Worried About Charity fundraiser in the Last Year:   . Arboriculturist in the Last Year:   Transportation Needs:   . Film/video editor (Medical):   Marland Kitchen Lack of Transportation (Non-Medical):   Physical Activity:   . Days of Exercise per Week:   . Minutes of Exercise per Session:   Stress:   . Feeling of Stress :   Social Connections:   . Frequency of Communication with Friends and Family:   . Frequency of Social Gatherings with Friends and Family:   . Attends Religious Services:   . Active Member of Clubs or Organizations:   . Attends Archivist Meetings:   Marland Kitchen Marital Status:   Intimate Partner Violence:   . Fear of Current or Ex-Partner:   . Emotionally Abused:   Marland Kitchen Physically Abused:   . Sexually Abused:      FAMILY HISTORY:  Family History  Problem Relation Age of Onset  . Depression Mother   . Bipolar disorder Mother   . Depression Father   . Bipolar disorder Father   . Depression Sister   . Bipolar disorder Sister       REVIEW OF SYSTEMS:  Review of Systems  Constitutional: Negative for chills and fever.  HENT: Negative for congestion and sore throat.   Respiratory: Negative for cough and shortness of breath.   Cardiovascular: Negative for chest pain and palpitations.  Gastrointestinal: Positive for abdominal pain. Negative for constipation, diarrhea, nausea and vomiting.  Genitourinary: Negative for dysuria and urgency.  Musculoskeletal: Positive for joint pain (Right Hip).  All other systems reviewed and are negative.   VITAL SIGNS:  Temp:  [98.3 F (36.8 C)-98.7 F (37.1 C)] 98.7 F (37.1 C) (07/05 2246) Pulse Rate:  [75-111] 89 (07/06 0547) Resp:  [16-20] 20 (07/06 0547) BP: (95-126)/(58-93) 105/93 (07/06 0547) SpO2:  [97 %-100 %] 98 % (07/06 0547) Weight:  [81.6 kg] 81.6 kg (07/05 1954)     Height: 5' 2"  (157.5 cm) Weight: 81.6 kg BMI (Calculated): 32.91   INTAKE/OUTPUT:  07/05 0701 - 07/06 0700 In:  1000 [IV Piggyback:1000] Out: -   PHYSICAL EXAM:  Physical Exam Vitals and nursing note reviewed.  Constitutional:      General: She is not in acute distress.    Appearance: She is obese. She is not ill-appearing.  HENT:     Head: Normocephalic and atraumatic.  Eyes:     General: No scleral icterus.    Extraocular Movements: Extraocular movements intact.  Cardiovascular:     Rate and Rhythm: Normal rate and regular rhythm.     Heart sounds: Normal heart sounds. No murmur heard.   Pulmonary:     Effort: Pulmonary effort is normal. No respiratory distress.  Abdominal:     General: There is no distension.     Palpations: Abdomen is soft.     Tenderness: There is abdominal tenderness in the right upper  quadrant. There is no guarding or rebound. Negative signs include Murphy's sign.     Comments: Abdomen is obese, soft, tenderness primarily over drain site, non-distended, cholecystostomy tube in the RUQ, this is not connected to any form of drainage collection system; just the tubing is present.  Genitourinary:    Comments: Deferred Skin:    General: Skin is warm and dry.  Neurological:     General: No focal deficit present.     Mental Status: She is alert and oriented to person, place, and time.  Psychiatric:        Mood and Affect: Mood normal.        Behavior: Behavior normal.      Labs:  CBC Latest Ref Rng & Units 12/04/2019 09/02/2019 08/28/2019  WBC 4.0 - 10.5 K/uL 6.9 8.0 5.2  Hemoglobin 12.0 - 15.0 g/dL 14.2 9.7(L) 9.5(L)  Hematocrit 36 - 46 % 42.8 31.0(L) 29.9(L)  Platelets 150 - 400 K/uL 288 393 388   CMP Latest Ref Rng & Units 12/04/2019 08/30/2019 08/28/2019  Glucose 70 - 99 mg/dL 126(H) 101(H) 113(H)  BUN 6 - 20 mg/dL 8 8 8   Creatinine 0.44 - 1.00 mg/dL 0.67 0.57 0.63  Sodium 135 - 145 mmol/L 140 136 139  Potassium 3.5 - 5.1 mmol/L 3.5 4.1 4.4  Chloride 98 - 111 mmol/L 104 99 100  CO2 22 - 32 mmol/L 26 25 28   Calcium 8.9 - 10.3 mg/dL 8.9 8.8(L) 8.9  Total Protein  6.5 - 8.1 g/dL 8.2(H) - -  Total Bilirubin 0.3 - 1.2 mg/dL 0.4 - -  Alkaline Phos 38 - 126 U/L 164(H) - -  AST 15 - 41 U/L 21 - -  ALT 0 - 44 U/L 29 - -     Imaging studies:   CT Abdomen/Pelvis (12/05/2019) personally reviewed which does show percutaneous cholecystostomy tube is outside gallbladder, and radiologist report reviewed:  IMPRESSION: Cholelithiasis and evidence of prior percutaneous cholecystostomy tube placement which now is displaced adjacent to the fundus of the gallbladder. Injection may be helpful to assess catheter position. A small amount of fluid is noted surrounding the catheter which likely represent some bile leakage.  Misplaced IUD as described which may be related to the Caesarean scar.  Increased sclerosis in the right sacroiliac joint with widening of the joint similar to that seen on recent MRI examination.   MRI Pelvis (12/06/2019) personally reviewed, and radiologist report reviewed:  IMPRESSION: 1. Findings consistent ongoing extensive septic arthritis and osteomyelitis of the right sacroiliac joint with a small joint effusion, however slightly improved in appearance from the prior exam. 2. new small heterogeneous collection within the right piriformis measuring 3.0 x 1.1 x 2.4 cm, consistent with intramuscular abscess. 3. Slightly improved appearance of the myositis involving the gluteal, piriformis, and iliopsoas musculature.   Assessment/Plan: (ICD-10's: Z43.3) 34 y.o. female with dislodged cholecystostomy tube placed on 04/13 at [facility] for biliary colic vs cholecystitis, complicated by pertinent comorbidities including known right septic arthritis and intra-musculare abscesses.   - We will pull percutaneous cholecystostomy tube today as it has been greater than 6-8 weeks and there is no evidence of cholecystitis currently  - She will need elective cholecystectomy at some point. She can follow up at [facility] for this for continuity of care.   - I  would defer any management of her intramuscular abscess to orthopedics given her history of septic arthritis as this is outside our area of expertise.   - Pain control prn  -  Okay for diet as long as no intervention planned from the abscess standpoint  - further management per primary service; we will sign off, no further general surgery issues. Follow up with surgery at [facility]  All of the above findings and recommendations were discussed with the patient and the medical team, and all of patient's questions were answered to her expressed satisfaction.  Thank you for the opportunity to participate in this patient's care.   -- Edison Simon, PA-C [facility] 12/05/2019, 10:48 AM [phone] M-F: 7am - 4pm  I saw and evaluated the patient.  I agree with the above documentation, exam, and plan, which I have edited where appropriate. Fredirick Maudlin  1:05 PM

## 2019-12-05 NOTE — ED Notes (Signed)
Dr. Hessie Knows notified of patient leaving AMA by secure chat, and said that was ok

## 2019-12-05 NOTE — ED Provider Notes (Signed)
[facility] Emergency Department Provider Note    First MD Initiated Contact with Patient 12/05/19 0138     (approximate)  I have reviewed the triage vital signs and the nursing notes.   HISTORY  Chief Complaint Abdominal Pain    HPI Leslie Molina is a 34 y.o. female below the past medical history with extensive history of IV drug abuse as well as recent hospitalization for cholelithiasis cholecystitis with PERC Coley drain as well as presumed septic arthritis osteomyelitis iliopsoas abscess.  She left AMA roughly 2 weeks ago was discharged on doxycycline is still taking that but is having worsening pain.  Tries with the PERC drain still in place is not dressed is not attached to anything.  She is complaining of right hip pain as well.    Past Medical History:  Diagnosis Date  . Anxiety   . Bipolar 1 disorder ([place])   . Depression   . Migraine   . Nerve damage    Family History  Problem Relation Age of Onset  . Depression Mother   . Bipolar disorder Mother   . Depression Father   . Bipolar disorder Father   . Depression Sister   . Bipolar disorder Sister    Past Surgical History:  Procedure Laterality Date  . CESAREAN SECTION    . IR THORACENTESIS ASP PLEURAL SPACE W/IMG GUIDE  08/09/2019   Patient Active Problem List   Diagnosis Date Noted  . Abdominal pain 12/05/2019  . Abscess   . Pleural empyema ([place]) 08/12/2019  . Pneumonia 08/12/2019  . Empyema ([place])   . Osteomyelitis of sacrum ([place]) 08/09/2019  . MRSA bacteremia 08/04/2019  . Pain of right clavicle 08/04/2019  . Right hand pain 08/04/2019  . Malpositioned IUD 08/03/2019  . Septic shock ([place]) 08/03/2019  . Sepsis ([place]) 08/02/2019  . Iliopsoas abscess on right ([place]) 08/02/2019  . Myositis 08/02/2019  . Hypokalemia 08/02/2019  . IUD migration 01/22/2015      Prior to Admission medications   Medication Sig Start Date End Date Taking? Authorizing Provider  doxycycline (ADOXA)  100 MG tablet Take 1 tablet (100 mg total) by mouth 2 (two) times daily. 09/03/19   Mercy Riding, MD    Allergies Patient has no known allergies.    Social History Social History   Tobacco Use  . Smoking status: Current Every Day Smoker    Packs/day: 1.00    Types: Cigarettes  . Smokeless tobacco: Never Used  Substance Use Topics  . Alcohol use: No    Alcohol/week: 0.0 standard drinks  . Drug use: No    Review of Systems Patient denies headaches, rhinorrhea, blurry vision, numbness, shortness of breath, chest pain, edema, cough, abdominal pain, nausea, vomiting, diarrhea, dysuria, fevers, rashes or hallucinations unless otherwise stated above in HPI. ____________________________________________   PHYSICAL EXAM:  VITAL SIGNS: Vitals:   12/05/19 0500 12/05/19 0547  BP: (!) 100/58 (!) 105/93  Pulse: 76 89  Resp: 20 20  Temp:    SpO2: 97% 98%    Constitutional: Alert and oriented.  Eyes: Conjunctivae are normal.  Head: Atraumatic. Nose: No congestion/rhinnorhea. Mouth/Throat: Mucous membranes are moist.   Neck: No stridor. Painless ROM.  Cardiovascular: Normal rate, regular rhythm. Grossly normal heart sounds.  Good peripheral circulation. Respiratory: Normal respiratory effort.  No retractions. Lungs CTAB. Gastrointestinal: Soft and nontender. percchole drain in place with small amount of erythema around insertion site, no fluctuanceNo distention. No abdominal bruits. No CVA tenderness. Genitourinary:  Musculoskeletal: No lower extremity tenderness nor edema.  No joint effusions. Neurologic:  Normal speech and language. No gross focal neurologic deficits are appreciated. No facial droop Skin:  Skin is warm, dry and intact. No rash noted. Psychiatric: Mood and affect are normal. Speech and behavior are normal.  ____________________________________________   LABS (all labs ordered are listed, but only abnormal results are displayed)  Results for orders placed or  performed during the hospital encounter of 12/05/19 (from the past 24 hour(s))  Urinalysis, Complete w Microscopic     Status: Abnormal   Collection Time: 12/04/19  7:57 PM  Result Value Ref Range   Color, Urine YELLOW (A) YELLOW   APPearance HAZY (A) CLEAR   Specific Gravity, Urine 1.021 1.005 - 1.030   pH 5.0 5.0 - 8.0   Glucose, UA NEGATIVE NEGATIVE mg/dL   Hgb urine dipstick NEGATIVE NEGATIVE   Bilirubin Urine NEGATIVE NEGATIVE   Ketones, ur NEGATIVE NEGATIVE mg/dL   Protein, ur NEGATIVE NEGATIVE mg/dL   Nitrite POSITIVE (A) NEGATIVE   Leukocytes,Ua TRACE (A) NEGATIVE   RBC / HPF 0-5 0 - 5 RBC/hpf   WBC, UA 21-50 0 - 5 WBC/hpf   Bacteria, UA RARE (A) NONE SEEN   Squamous Epithelial / LPF 6-10 0 - 5   Mucus PRESENT   Lipase, blood     Status: None   Collection Time: 12/04/19  7:58 PM  Result Value Ref Range   Lipase 43 11 - 51 U/L  Comprehensive metabolic panel     Status: Abnormal   Collection Time: 12/04/19  7:58 PM  Result Value Ref Range   Sodium 140 135 - 145 mmol/L   Potassium 3.5 3.5 - 5.1 mmol/L   Chloride 104 98 - 111 mmol/L   CO2 26 22 - 32 mmol/L   Glucose, Bld 126 (H) 70 - 99 mg/dL   BUN 8 6 - 20 mg/dL   Creatinine, Ser 0.67 0.44 - 1.00 mg/dL   Calcium 8.9 8.9 - 10.3 mg/dL   Total Protein 8.2 (H) 6.5 - 8.1 g/dL   Albumin 3.9 3.5 - 5.0 g/dL   AST 21 15 - 41 U/L   ALT 29 0 - 44 U/L   Alkaline Phosphatase 164 (H) 38 - 126 U/L   Total Bilirubin 0.4 0.3 - 1.2 mg/dL   GFR calc non Af Amer >60 >60 mL/min   GFR calc Af Amer >60 >60 mL/min   Anion gap 10 5 - 15  CBC     Status: Abnormal   Collection Time: 12/04/19  7:58 PM  Result Value Ref Range   WBC 6.9 4.0 - 10.5 K/uL   RBC 5.51 (H) 3.87 - 5.11 MIL/uL   Hemoglobin 14.2 12.0 - 15.0 g/dL   HCT 42.8 36 - 46 %   MCV 77.7 (L) 80.0 - 100.0 fL   MCH 25.8 (L) 26.0 - 34.0 pg   MCHC 33.2 30.0 - 36.0 g/dL   RDW 13.7 11.5 - 15.5 %   Platelets 288 150 - 400 K/uL   nRBC 0.0 0.0 - 0.2 %  Pregnancy, urine POC      Status: None   Collection Time: 12/04/19  7:59 PM  Result Value Ref Range   Preg Test, Ur NEGATIVE NEGATIVE   ____________________________________________ ____________________________________________  RADIOLOGY  I personally reviewed all radiographic images ordered to evaluate for the above acute complaints and reviewed radiology reports and findings.  These findings were personally discussed with the patient.  Please see medical  record for radiology report.  ____________________________________________   PROCEDURES  Procedure(s) performed:  Procedures    Critical Care performed: no ____________________________________________   INITIAL IMPRESSION / ASSESSMENT AND PLAN / ED COURSE  Pertinent labs & imaging results that were available during my care of the patient were reviewed by me and considered in my medical decision making (see chart for details).   DDX: Cholelithiasis, cholecystitis, abscess, arthritis, sepsis, osteo-, cystitis  Leslie Molina is a 34 y.o. who presents to the ED with complex presentation with recent hospitalizations as documented medical record.  Does have evidence of persistent percutaneous cholecystostomy tube in place that this was was to be removed back in May.  Does have tenderness in that location.  CT imaging and right upper quadrant ordering given persistent pain.  Does not seem to have evidence of acute cholecystitis no white count no evidence of biliary obstruction but is having some discomfort and is also reporting worsening right hip pain which on previous admission was secondary to sacroiliitis and concern for osteomyelitis with history of MRSA bacteremia in setting of IV drug abuse.  She is normotensive currently afebrile.  Will give pain medication as life be fluids.  Clinical Course as of Dec 05 634  Tue Dec 05, 2019  [id number] Case was discussed in consultation with surgery, Dr. Celine Ahr.  No indication for emergent cholecystectomy given absence of  fever or leukocytosis or biliary obstruction.  She will follow along during hospitalization for management of drain and further plans on cholecystectomy.  Will discuss with hospitalist for admission particularly given history of osteomyelitis with persistent right hip pain persistent swelling.  No signs of sepsis therefore will hold off on antibiotics right now but will order MRI.   She is still having persistent pain and nausea will continue IV fluids and IV pain medication.  Discussed with hospitalist.   [PR]    Clinical Course User Index [PR] Merlyn Lot, MD    The patient was evaluated in Emergency Department today for the symptoms described in the history of present illness. He/she was evaluated in the context of the global COVID-19 pandemic, which necessitated consideration that the patient might be at risk for infection with the SARS-CoV-2 virus that causes COVID-19. Institutional protocols and algorithms that pertain to the evaluation of patients at risk for COVID-19 are in a state of rapid change based on information released by regulatory bodies including the CDC and federal and state organizations. These policies and algorithms were followed during the patient's care in the ED.  As part of my medical decision making, I reviewed the following data within the [facility] notes reviewed and incorporated, Labs reviewed, notes from prior ED visits and [place] Controlled Substance Database   ____________________________________________   FINAL CLINICAL IMPRESSION(S) / ED DIAGNOSES  Final diagnoses:  RUQ abdominal pain  Right hip pain      NEW MEDICATIONS STARTED DURING THIS VISIT:  New Prescriptions   No medications on file     Note:  This document was prepared using Dragon voice recognition software and may include unintentional dictation errors.    Merlyn Lot, MD 12/05/19 [phone]

## 2019-12-05 NOTE — ED Notes (Signed)
See triage notes.  Pt reports RUQ pain is 10/10, abdomen is tender, right hip pain related to fracture in March is also 10/10.  A drain is present in the RUQ but no bag attached, pt reports no drainage for 2 weeks.  Hx of MRSA.

## 2019-12-05 NOTE — Consult Note (Signed)
Pharmacy Antibiotic Note  Leslie Molina is a 34 y.o. female admitted on 12/05/2019 with Abscess of muscle.  Pharmacy has been consulted for vancomycin dosing. Preg test - negative. Hx of MRSA in abscess culture in 07/2019.  Plan: Patient to give vancomycin 2000 mg x 1 loading dose followed by 1000 mg q8H. Plan to get trough level in 2-3 days. Goal trough is 15-20.   Height: 5\' 2"  (157.5 cm) Weight: 81.6 kg (180 lb) IBW/kg (Calculated) : 50.1  Temp (24hrs), Avg:98.5 F (36.9 C), Min:98.3 F (36.8 C), Max:98.7 F (37.1 C)  Recent Labs  Lab 12/04/19 1958  WBC 6.9  CREATININE 0.67    Estimated Creatinine Clearance: 97.2 mL/min (by C-G formula based on SCr of 0.67 mg/dL).    No Known Allergies   Microbiology results: 7/5 UCx: pending    Thank you for allowing pharmacy to be a part of this patients care.  Oswald Hillock, PharmD, BCPS 12/05/2019 3:22 PM

## 2019-12-05 NOTE — ED Notes (Signed)
Hourly rounding reveals patient sleeping in room. No complaints, stable, in no acute distress. Q15 minute rounds to continue.

## 2019-12-05 NOTE — Consult Note (Signed)
Reason for Consult: Right piriformis abscess Referring Physician: Dr. Rosita [name] is an 34 y.o. female.  HPI: Leslie Molina is a 34 y.o. 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 Pleural fluid culture with MRSA), recent acute cholecystitis, s/p of percutaneous drainage tube placement in [facility], who presents with abdominal pain and right hip pain.  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 She left hospital on AMA. She was then admitted to [facility] due to acute cholecystitis 4/11. She is s/p of percutaneous drainage tube placement. The draining tube is still in place, but no fluid coming out per pt.   Patient states that her abdominal pain has been worsening the past several days, which is located in the right upper quadrant, constant, 10 out of 10 severity, sharp, nonradiating.  She does not have nausea, vomiting, diarrhea.  No fever or chills.  She also has worsening right hip pain, which is constant, sharp, 10 out of 10 severity, nonradiating.  She denies chest pain, shortness breath, cough.  She denies symptoms of UTI.  No unilateral weakness.   Past Medical History:  Diagnosis Date  . Anxiety   . Bipolar 1 disorder ([place])   . Depression   . Migraine   . Nerve damage     Past Surgical History:  Procedure Laterality Date  . CESAREAN SECTION    . IR THORACENTESIS ASP PLEURAL SPACE W/IMG GUIDE  08/09/2019    Family History  Problem Relation Age of Onset  . Depression Mother   . Bipolar disorder Mother   . Depression Father    . Bipolar disorder Father   . Depression Sister   . Bipolar disorder Sister     Social History:  reports that she has been smoking cigarettes. She has been smoking about 1.00 pack per day. She has never used smokeless tobacco. She reports that she does not drink alcohol and does not use drugs.  Allergies: No Known Allergies  Medications: I have reviewed the patient's current medications.  Results for orders placed or performed during the hospital encounter of 12/05/19 (from the past 48 hour(s))  Urinalysis, Complete w Microscopic     Status: Abnormal   Collection Time: 12/04/19  7:57 PM  Result Value Ref Range   Color, Urine YELLOW (A) YELLOW   APPearance HAZY (A) CLEAR   Specific Gravity, Urine 1.021 1.005 - 1.030   pH 5.0 5.0 - 8.0   Glucose, UA NEGATIVE NEGATIVE mg/dL   Hgb urine dipstick NEGATIVE NEGATIVE   Bilirubin Urine NEGATIVE NEGATIVE   Ketones, ur NEGATIVE NEGATIVE mg/dL   Protein, ur NEGATIVE NEGATIVE mg/dL   Nitrite POSITIVE (A) NEGATIVE   Leukocytes,Ua TRACE (A) NEGATIVE   RBC / HPF 0-5 0 - 5 RBC/hpf   WBC, UA 21-50 0 - 5 WBC/hpf   Bacteria, UA RARE (A) NONE SEEN   Squamous Epithelial / LPF 6-10 0 - 5   Mucus PRESENT     Comment: Performed at [facility], [address] [place], [place] [address]  Lipase, blood     Status: None   Collection Time: 12/04/19  7:58 PM  Result Value  Ref Range   Lipase 43 11 - 51 U/L    Comment: Performed at [facility], [address] [place], [place] [address]  Comprehensive metabolic panel     Status: Abnormal   Collection Time: 12/04/19  7:58 PM  Result Value Ref Range   Sodium 140 135 - 145 mmol/L   Potassium 3.5 3.5 - 5.1 mmol/L   Chloride 104 98 - 111 mmol/L   CO2 26 22 - 32 mmol/L   Glucose, Bld 126 (H) 70 - 99 mg/dL    Comment: Glucose reference range applies only to samples taken after fasting for at least 8 hours.   BUN 8 6 - 20 mg/dL   Creatinine, Ser 0.67 0.44 - 1.00 mg/dL   Calcium 8.9  8.9 - 10.3 mg/dL   Total Protein 8.2 (H) 6.5 - 8.1 g/dL   Albumin 3.9 3.5 - 5.0 g/dL   AST 21 15 - 41 U/L   ALT 29 0 - 44 U/L   Alkaline Phosphatase 164 (H) 38 - 126 U/L   Total Bilirubin 0.4 0.3 - 1.2 mg/dL   GFR calc non Af Amer >60 >60 mL/min   GFR calc Af Amer >60 >60 mL/min   Anion gap 10 5 - 15    Comment: Performed at [facility], [address] [place], [place] [address]  CBC     Status: Abnormal   Collection Time: 12/04/19  7:58 PM  Result Value Ref Range   WBC 6.9 4.0 - 10.5 K/uL   RBC 5.51 (H) 3.87 - 5.11 MIL/uL   Hemoglobin 14.2 12.0 - 15.0 g/dL   HCT 42.8 36 - 46 %   MCV 77.7 (L) 80.0 - 100.0 fL   MCH 25.8 (L) 26.0 - 34.0 pg   MCHC 33.2 30.0 - 36.0 g/dL   RDW 13.7 11.5 - 15.5 %   Platelets 288 150 - 400 K/uL   nRBC 0.0 0.0 - 0.2 %    Comment: Performed at [facility], [address], [place], [place] [address]  Urine Drug Screen, Qualitative ([place] only)     Status: None   Collection Time: 12/04/19  7:58 PM  Result Value Ref Range   Tricyclic, Ur Screen NONE DETECTED NONE DETECTED   Amphetamines, Ur Screen NONE DETECTED NONE DETECTED   MDMA (Ecstasy)Ur Screen NONE DETECTED NONE DETECTED   Cocaine Metabolite,Ur [place] NONE DETECTED NONE DETECTED   Opiate, Ur Screen NONE DETECTED NONE DETECTED   Phencyclidine (PCP) Ur S NONE DETECTED NONE DETECTED   Cannabinoid 50 Ng, Ur [place] NONE DETECTED NONE DETECTED   Barbiturates, Ur Screen NONE DETECTED NONE DETECTED   Benzodiazepine, Ur Scrn NONE DETECTED NONE DETECTED   Methadone Scn, Ur NONE DETECTED NONE DETECTED    Comment: (NOTE) Tricyclics + metabolites, urine    Cutoff 1000 ng/mL Amphetamines + metabolites, urine  Cutoff 1000 ng/mL MDMA (Ecstasy), urine              Cutoff 500 ng/mL Cocaine Metabolite, urine          Cutoff 300 ng/mL Opiate + metabolites, urine        Cutoff 300 ng/mL Phencyclidine (PCP), urine         Cutoff 25 ng/mL Cannabinoid, urine                 Cutoff 50 ng/mL Barbiturates  + metabolites, urine  Cutoff 200 ng/mL Benzodiazepine, urine  Cutoff 200 ng/mL Methadone, urine                   Cutoff 300 ng/mL  The urine drug screen provides only a preliminary, unconfirmed analytical test result and should not be used for non-medical purposes. Clinical consideration and professional judgment should be applied to any positive drug screen result due to possible interfering substances. A more specific alternate chemical method must be used in order to obtain a confirmed analytical result. Gas chromatography / mass spectrometry (GC/MS) is the preferred confirm atory method. Performed at [facility], [address] [place], [place] [address]   Pregnancy, urine POC     Status: None   Collection Time: 12/04/19  7:59 PM  Result Value Ref Range   Preg Test, Ur NEGATIVE NEGATIVE    Comment:        THE SENSITIVITY OF THIS METHODOLOGY IS >24 mIU/mL   SARS Coronavirus 2 by RT PCR (hospital order, performed in [facility] hospital lab) Nasopharyngeal Nasopharyngeal Swab     Status: None   Collection Time: 12/05/19  6:38 AM   Specimen: Nasopharyngeal Swab  Result Value Ref Range   SARS Coronavirus 2 NEGATIVE NEGATIVE    Comment: (NOTE) SARS-CoV-2 target nucleic acids are NOT DETECTED.  The SARS-CoV-2 RNA is generally detectable in upper and lower respiratory specimens during the acute phase of infection. The lowest concentration of SARS-CoV-2 viral copies this assay can detect is 250 copies / mL. A negative result does not preclude SARS-CoV-2 infection and should not be used as the sole basis for treatment or other patient management decisions.  A negative result may occur with improper specimen collection / handling, submission of specimen other than nasopharyngeal swab, presence of viral mutation(s) within the areas targeted by this assay, and inadequate number of viral copies (<250 copies / mL). A negative result must be combined with  clinical observations, patient history, and epidemiological information.  Fact Sheet for Patients:   StrictlyIdeas.no  Fact Sheet for Healthcare Providers: BankingDealers.co.za  This test is not yet approved or  cleared by the [place] FDA and has been authorized for detection and/or diagnosis of SARS-CoV-2 by FDA under an Emergency Use Authorization (EUA).  This EUA will remain in effect (meaning this test can be used) for the duration of the COVID-19 declaration under Section 564(b)(1) of the Act, 21 U.S.C. section 360bbb-3(b)(1), unless the authorization is terminated or revoked sooner.  Performed at [facility], [address] [place], [place] [address]     MR PELVIS W WO CONTRAST  Result Date: 12/05/2019 CLINICAL DATA:  Right hip pain, worsening, treatment for 4 months of MRSA infection EXAM: MRI PELVIS WITHOUT AND WITH CONTRAST TECHNIQUE: Multiplanar multisequence MR imaging of the pelvis was performed both before and after administration of intravenous contrast. CONTRAST:  7.56mL GADAVIST GADOBUTROL 1 MMOL/ML IV SOLN COMPARISON:  CT same day, MRI Oct 10, 2019 FINDINGS: Bones: Again noted is patchy increased T2 hyperintense/T1 hypointense signal seen throughout the right sacroiliac joint, slightly improved in appearance than the prior exam. There is however increasing sclerosis seen around the sacroiliac joint. There is erosive type change seen at the sacroiliac joint with a small amount of joint fluid which also appears to be slightly improved from the prior exam. No osseous fracture is seen. The remainder of the osseous structures are intact. Articular cartilage and labrum Articular cartilage: No focal chondral defect or subchondral signal abnormality identified. The ligamentum teres is intact Labrum: There  is no gross labral tear or paralabral abnormality. Joint or bursal effusion Joint effusion: Again noted is  heterogeneous fluid within the right sacroiliac joint, slightly improved since the prior exam. No hip joint effusion is noted. Bursae: No focal periarticular fluid collection. Muscles and tendons Muscles and tendons: Increased feathery T2 hyperintense signal seen throughout the gluteal musculature, iliopsoas, erector spine a and within the piriformis. There is a there appears to be a new collection within the piriformis musculature measuring approximately 3.0 x 1.1 by 2.4 cm, consistent with intramuscular abscess. There is extensive heterogeneous signal seen within the remainder of the piriformis musculature. Other findings Miscellaneous: The visualized internal pelvic contents appear unremarkable. IMPRESSION: 1. Findings consistent ongoing extensive septic arthritis and osteomyelitis of the right sacroiliac joint with a small joint effusion, however slightly improved in appearance from the prior exam. 2. new small heterogeneous collection within the right piriformis measuring 3.0 x 1.1 x 2.4 cm, consistent with intramuscular abscess. 3. Slightly improved appearance of the myositis involving the gluteal, piriformis, and iliopsoas musculature. Electronically Signed   By: Prudencio Pair M.D.   On: 12/05/2019 08:11   CT ABDOMEN PELVIS W CONTRAST  Result Date: 12/05/2019 CLINICAL DATA:  Increasing abdominal pain EXAM: CT ABDOMEN AND PELVIS WITH CONTRAST TECHNIQUE: Multidetector CT imaging of the abdomen and pelvis was performed using the standard protocol following bolus administration of intravenous contrast. CONTRAST:  19mL OMNIPAQUE IOHEXOL 300 MG/ML  SOLN COMPARISON:  Ultrasound from earlier in the same day as well as 10/13/2019, MRI from 10/10/2019 FINDINGS: Lower chest: Within normal limits. Hepatobiliary: Liver is mildly fatty infiltrated. Gallbladder is partially distended. Known cholelithiasis is not as well visualized as on the recent ultrasound examination due to poor calcification. There is a right upper  quadrant catheter identified adjacent to the gallbladder but not within the gallbladder which likely represents a displaced percutaneous cholecystostomy tube. Correlate with any drainage. Injection may be helpful as well. Pancreas: Unremarkable. No pancreatic ductal dilatation or surrounding inflammatory changes. Spleen: Normal in size without focal abnormality. Adrenals/Urinary Tract: Adrenal glands are within normal limits. Kidneys demonstrate a normal enhancement pattern bilaterally. No renal calculi or obstructive changes are seen. Bladder is well distended. Stomach/Bowel: The appendix is within normal limits. Colon shows no obstructive or inflammatory changes. Small bowel is unremarkable. Stomach is within normal limits. Vascular/Lymphatic: No significant vascular findings are present. No enlarged abdominal or pelvic lymph nodes. Reproductive: Uterus is within normal limits. An IUD is noted in place but is significantly displaced to the left with at least one of the arms extending to the margin of the uterus on the left and possibly into the peritoneal cavity. Clinical correlation is recommended. This may knee removal and replacement. It is possible the displacement is related to the prior Caesarean scar. Other: No abdominal wall hernia or abnormality. No abdominopelvic ascites. Musculoskeletal: No acute bony abnormality is noted. Increased sclerosis is noted involving the right sacroiliac joint this is similar to that seen on prior MRI examination. IMPRESSION: Cholelithiasis and evidence of prior percutaneous cholecystostomy tube placement which now is displaced adjacent to the fundus of the gallbladder. Injection may be helpful to assess catheter position. A small amount of fluid is noted surrounding the catheter which likely represent some bile leakage. Misplaced IUD as described which may be related to the Caesarean scar. Increased sclerosis in the right sacroiliac joint with widening of the joint similar  to that seen on recent MRI examination. Electronically Signed   By: Linus Mako.D.  On: 12/05/2019 03:43   US ABDOMEN LIMITED RUQ  Result Date: 12/05/2019 CLINICAL DATA:  Right upper quadrant pain common no known right upper quadrant drainage EXAM: ULTRASOUND ABDOMEN LIMITED RIGHT UPPER QUADRANT COMPARISON:  CT from 08/10/2019 FINDINGS: Gallbladder: Gallbladder is well distended. Gallstones are noted within the gallbladder. No wall thickening is noted. Negative sonographic Murphy's sign is seen. Echogenic focus is noted adjacent to the gallbladder consistent with the given clinical history of percutaneous cholecystostomy although the tube does not appear to lie within the gallbladder. Common bile duct: Diameter: 3.3 mm. Liver: No focal lesion identified. Within normal limits in parenchymal echogenicity. Portal vein is patent on color Doppler imaging with normal direction of blood flow towards the liver. Other: None. IMPRESSION: Drainage catheter in the right upper quadrant felt to be related to a percutaneous cholecystostomy tube. Clinical correlation is recommended. The catheter may lie extrinsic to the gallbladder lumen. Injection may be helpful when able. Cholelithiasis. Electronically Signed   By: Inez Catalina M.D.   On: 12/05/2019 03:37  Prior CT and hip MRI were reviewed as well.  Review of Systems Blood pressure (!) 105/93, pulse 89, temperature 98.7 F (37.1 C), resp. rate 20, height 5\' 2"  (1.575 m), weight 81.6 kg, SpO2 98 %. Physical Exam She is neurovascular intact of the right lower extremity without any evidence of sciatica.  She has minimal pain with turning in bed.  She has posterior pain more at the SI joint and over the lateral hip but not in the piriformis area or sciatic notch to direct palpation. Gait was not assessed Assessment/Plan: Piriformis abscess right hip Recommendation is for empiric antibiotics.  There is a fairly small abscess and without sac nerve symptoms do not  think the the benefit of a surgery at this time would outweigh that of the risk of injury to the static nerve and with her recent history of infection in the gluteal muscles this might aggravate them as well.  I will continue to follow her and if she develops headaches or symptoms she will need follow-up study and if her mass is enlarged have I&D less radiology feels that they could percutaneously drain it safely.  Hessie Knows 12/05/2019, 3:34 PM

## 2019-12-08 LAB — URINE CULTURE: Culture: >=100000 — AB

## 2019-12-09 NOTE — Progress Notes (Signed)
ED Antimicrobial Stewardship Positive Culture Follow Up   Leslie Molina is an 34 y.o. female who presented to [facility] on 12/05/2019 with a chief complaint of  Chief Complaint  Patient presents with  . Abdominal Pain    Recent Results (from the past 720 hour(s))  Urine Culture     Status: Abnormal   Collection Time: 12/04/19  7:58 PM   Specimen: Urine, Random  Result Value Ref Range Status   Specimen Description   Final    URINE, RANDOM Performed at [facility], [address] [place], [place] [address]    Special Requests   Final    NONE Performed at [facility], [place], [place] [address]    Culture >=100,000 COLONIES/mL ESCHERICHIA COLI (A)  Final   Report Status 12/08/2019 FINAL  Final   Organism ID, Bacteria ESCHERICHIA COLI (A)  Final      Susceptibility   Escherichia coli - MIC*    AMPICILLIN <=2 SENSITIVE Sensitive     CEFAZOLIN <=4 SENSITIVE Sensitive     CEFTRIAXONE <=0.25 SENSITIVE Sensitive     CIPROFLOXACIN <=0.25 SENSITIVE Sensitive     GENTAMICIN <=1 SENSITIVE Sensitive     IMIPENEM <=0.25 SENSITIVE Sensitive     NITROFURANTOIN <=16 SENSITIVE Sensitive     TRIMETH/SULFA <=20 SENSITIVE Sensitive     AMPICILLIN/SULBACTAM <=2 SENSITIVE Sensitive     PIP/TAZO <=4 SENSITIVE Sensitive     * >=100,000 COLONIES/mL ESCHERICHIA COLI  SARS Coronavirus 2 by RT PCR (hospital order, performed in [place] hospital lab) Nasopharyngeal Nasopharyngeal Swab     Status: None   Collection Time: 12/05/19  6:38 AM   Specimen: Nasopharyngeal Swab  Result Value Ref Range Status   SARS Coronavirus 2 NEGATIVE NEGATIVE Final    Comment: (NOTE) SARS-CoV-2 target nucleic acids are NOT DETECTED.  The SARS-CoV-2 RNA is generally detectable in upper and lower respiratory specimens during the acute phase of infection. The lowest concentration of SARS-CoV-2 viral copies this assay can detect is 250 copies / mL. A negative result does not preclude  SARS-CoV-2 infection and should not be used as the sole basis for treatment or other patient management decisions.  A negative result may occur with improper specimen collection / handling, submission of specimen other than nasopharyngeal swab, presence of viral mutation(s) within the areas targeted by this assay, and inadequate number of viral copies (<250 copies / mL). A negative result must be combined with clinical observations, patient history, and epidemiological information.  Fact Sheet for Patients:   StrictlyIdeas.no  Fact Sheet for Healthcare Providers: BankingDealers.co.za  This test is not yet approved or  cleared by the [place] FDA and has been authorized for detection and/or diagnosis of SARS-CoV-2 by FDA under an Emergency Use Authorization (EUA).  This EUA will remain in effect (meaning this test can be used) for the duration of the COVID-19 declaration under Section 564(b)(1) of the Act, 21 U.S.C. section 360bbb-3(b)(1), unless the authorization is terminated or revoked sooner.  Performed at [facility], [address] [place], [place] [address]     A/P: [phone] patient with history of IV drug use presented to ED on 7/6 with abdominal pain. She left AMA roughly 2 weeks ago and was discharged on doxycycline that she is still taking but is having worsening pain. Patient with history of MRSA and history of UTI. No complaints of any urinary problems this last admission. Was to be admitted to Med Surg unit, but  left AMA again. Patient's urine culture was positive for E. Coli. Called patient regarding this and she stated that she did not have any urinary pain or problems and that even if we called in antibiotics. Discussed matter with EDP and believes it may be colonization since there are no signs or symptoms.    Pearla Dubonnet ,PHARMD Clinical Pharmacist  12/09/2019, 3:12 PM

## 2019-12-12 ENCOUNTER — Other Ambulatory Visit: Payer: Self-pay

## 2019-12-12 ENCOUNTER — Emergency Department
Admission: EM | Admit: 2019-12-12 | Discharge: 2019-12-12 | Disposition: A | Discharge status: Home / Self Care | Payer: Medicaid Other | Attending: Emergency Medicine | Admitting: Emergency Medicine

## 2019-12-12 ENCOUNTER — Telehealth: Payer: Self-pay | Admitting: Emergency Medicine

## 2019-12-12 DIAGNOSIS — Z5321 Procedure and treatment not carried out due to patient leaving prior to being seen by health care provider: Secondary | ICD-10-CM | POA: Insufficient documentation

## 2019-12-12 DIAGNOSIS — M25551 Pain in right hip: Secondary | ICD-10-CM | POA: Diagnosis present

## 2019-12-12 LAB — CBC WITH DIFFERENTIAL/PLATELET
Abs Immature Granulocytes: 0.04 10*3/uL (ref 0.00–0.07)
Basophils Absolute: 0.1 10*3/uL (ref 0.0–0.1)
Basophils Relative: 1 %
Eosinophils Absolute: 0.2 10*3/uL (ref 0.0–0.5)
Eosinophils Relative: 2 %
HCT: 42.9 % (ref 36.0–46.0)
Hemoglobin: 14.2 g/dL (ref 12.0–15.0)
Immature Granulocytes: 0 %
Lymphocytes Relative: 30 %
Lymphs Abs: 3 10*3/uL (ref 0.7–4.0)
MCH: 25.4 pg — ABNORMAL LOW (ref 26.0–34.0)
MCHC: 33.1 g/dL (ref 30.0–36.0)
MCV: 76.9 fL — ABNORMAL LOW (ref 80.0–100.0)
Monocytes Absolute: 0.7 10*3/uL (ref 0.1–1.0)
Monocytes Relative: 7 %
Neutro Abs: 5.8 10*3/uL (ref 1.7–7.7)
Neutrophils Relative %: 60 %
Platelets: 305 10*3/uL (ref 150–400)
RBC: 5.58 MIL/uL — ABNORMAL HIGH (ref 3.87–5.11)
RDW: 14 % (ref 11.5–15.5)
WBC: 9.8 10*3/uL (ref 4.0–10.5)
nRBC: 0 % (ref 0.0–0.2)

## 2019-12-12 LAB — LACTIC ACID, PLASMA: Lactic Acid, Venous: 1.6 mmol/L (ref 0.5–1.9)

## 2019-12-12 LAB — COMPREHENSIVE METABOLIC PANEL
ALT: 17 U/L (ref 0–44)
AST: 17 U/L (ref 15–41)
Albumin: 4.2 g/dL (ref 3.5–5.0)
Alkaline Phosphatase: 153 U/L — ABNORMAL HIGH (ref 38–126)
Anion gap: 12 (ref 5–15)
BUN: 12 mg/dL (ref 6–20)
CO2: 23 mmol/L (ref 22–32)
Calcium: 9.5 mg/dL (ref 8.9–10.3)
Chloride: 104 mmol/L (ref 98–111)
Creatinine, Ser: 0.79 mg/dL (ref 0.44–1.00)
GFR calc Af Amer: >60 mL/min (ref >60)
GFR calc non Af Amer: >60 mL/min (ref >60)
Glucose, Bld: 115 mg/dL — ABNORMAL HIGH (ref 70–99)
Potassium: 3.6 mmol/L (ref 3.5–5.1)
Sodium: 139 mmol/L (ref 135–145)
Total Bilirubin: 0.3 mg/dL (ref 0.3–1.2)
Total Protein: 8.4 g/dL — ABNORMAL HIGH (ref 6.5–8.1)

## 2019-12-12 NOTE — ED Notes (Signed)
Called for reassessment, no answer.

## 2019-12-12 NOTE — ED Notes (Signed)
No answer when called several times from lobby 

## 2019-12-12 NOTE — Telephone Encounter (Signed)
Called patient due to lwot to inquire about condition and follow up plans. Left message.   

## 2019-12-12 NOTE — ED Triage Notes (Addendum)
Reports right hip pain radiating down right leg and across lower bag. Pt was recently hospitalization for infection to right hip. Pt currently taking ABX for same.  Color and sensation to right leg WNL'

## 2019-12-12 NOTE — ED Notes (Signed)
Cannot find pt, no answer

## 2019-12-13 ENCOUNTER — Other Ambulatory Visit: Payer: Self-pay

## 2019-12-13 ENCOUNTER — Emergency Department
Admission: EM | Admit: 2019-12-13 | Discharge: 2019-12-13 | Disposition: A | Discharge status: Home / Self Care | Payer: Medicaid Other | Attending: Emergency Medicine | Admitting: Emergency Medicine

## 2019-12-13 DIAGNOSIS — R1011 Right upper quadrant pain: Secondary | ICD-10-CM | POA: Diagnosis present

## 2019-12-13 DIAGNOSIS — F17210 Nicotine dependence, cigarettes, uncomplicated: Secondary | ICD-10-CM | POA: Insufficient documentation

## 2019-12-13 DIAGNOSIS — K8020 Calculus of gallbladder without cholecystitis without obstruction: Secondary | ICD-10-CM | POA: Insufficient documentation

## 2019-12-13 DIAGNOSIS — M009 Pyogenic arthritis, unspecified: Secondary | ICD-10-CM | POA: Insufficient documentation

## 2019-12-13 LAB — CBC
HCT: 40.2 % (ref 36.0–46.0)
Hemoglobin: 13.7 g/dL (ref 12.0–15.0)
MCH: 25.7 pg — ABNORMAL LOW (ref 26.0–34.0)
MCHC: 34.1 g/dL (ref 30.0–36.0)
MCV: 75.3 fL — ABNORMAL LOW (ref 80.0–100.0)
Platelets: 274 10*3/uL (ref 150–400)
RBC: 5.34 MIL/uL — ABNORMAL HIGH (ref 3.87–5.11)
RDW: 14.3 % (ref 11.5–15.5)
WBC: 9 10*3/uL (ref 4.0–10.5)
nRBC: 0 % (ref 0.0–0.2)

## 2019-12-13 LAB — LIPASE, BLOOD: Lipase: 32 U/L (ref 11–51)

## 2019-12-13 LAB — URINALYSIS, COMPLETE (UACMP) WITH MICROSCOPIC
Bilirubin Urine: NEGATIVE
Glucose, UA: NEGATIVE mg/dL
Hgb urine dipstick: NEGATIVE
Ketones, ur: NEGATIVE mg/dL
Leukocytes,Ua: NEGATIVE
Nitrite: NEGATIVE
Protein, ur: NEGATIVE mg/dL
Specific Gravity, Urine: 1.021 (ref 1.005–1.030)
pH: 6 (ref 5.0–8.0)

## 2019-12-13 LAB — COMPREHENSIVE METABOLIC PANEL
ALT: 16 U/L (ref 0–44)
AST: 15 U/L (ref 15–41)
Albumin: 4.2 g/dL (ref 3.5–5.0)
Alkaline Phosphatase: 147 U/L — ABNORMAL HIGH (ref 38–126)
Anion gap: 12 (ref 5–15)
BUN: 11 mg/dL (ref 6–20)
CO2: 23 mmol/L (ref 22–32)
Calcium: 9 mg/dL (ref 8.9–10.3)
Chloride: 101 mmol/L (ref 98–111)
Creatinine, Ser: 0.64 mg/dL (ref 0.44–1.00)
GFR calc Af Amer: >60 mL/min (ref >60)
GFR calc non Af Amer: >60 mL/min (ref >60)
Glucose, Bld: 120 mg/dL — ABNORMAL HIGH (ref 70–99)
Potassium: 3.9 mmol/L (ref 3.5–5.1)
Sodium: 136 mmol/L (ref 135–145)
Total Bilirubin: 0.6 mg/dL (ref 0.3–1.2)
Total Protein: 8.4 g/dL — ABNORMAL HIGH (ref 6.5–8.1)

## 2019-12-13 LAB — POCT PREGNANCY, URINE: Preg Test, Ur: NEGATIVE

## 2019-12-13 MED ORDER — OXYCODONE-ACETAMINOPHEN 5-325 MG PO TABS
1.0000 | ORAL_TABLET | Freq: Once | ORAL | Status: AC
  Administered 2019-12-13: 1 via ORAL
  Filled 2019-12-13: qty 1

## 2019-12-13 MED ORDER — CLINDAMYCIN HCL 300 MG PO CAPS
300.0000 mg | ORAL_CAPSULE | Freq: Three times a day (TID) | ORAL | 0 refills | Status: AC
Start: 2019-12-13 — End: 2019-12-23

## 2019-12-13 NOTE — ED Triage Notes (Signed)
Reports RUQ pain, states scheduled for gallbladder removal 7/20. Nausea/emesis, feeling of fullness. Pt alert and oriented X4, cooperative, RR even and unlabored, color WNL. Pt in NAD.

## 2019-12-13 NOTE — Discharge Instructions (Signed)
Keep your date for your surgery.  Rotate Tylenol and ibuprofen.  Follow-up with orthopedics.  If you develop a fever or have other changes of concern, return to the ER.

## 2019-12-13 NOTE — ED Notes (Signed)
See triage note  Presents with abd pain and n/v   States she is scheduled to her gallbladder removed next week..  States she is not able to eat anything

## 2019-12-13 NOTE — ED Provider Notes (Signed)
[facility] Emergency Department Provider Note ____________________________________________   First MD Initiated Contact with Patient 12/13/19 1357     (approximate)  I have reviewed the triage vital signs and the nursing notes.   HISTORY  Chief Complaint Abdominal Pain  HPI Tenesha Garza is a 34 y.o. female presents to the emergency department for treatment and evaluation of right upper quadrant pain as well has right hip pain.  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  This appointment is scheduled with a surgeon in [facility].  This afternoon, she states that her right hip pain has increased.  She is taking clindamycin as prescribed.  She feels that the infection is getting worse.         Past Medical History:  Diagnosis Date  . Anxiety   . Bipolar 1 disorder ([place])   . Depression   . Migraine   . Nerve damage     Patient Active Problem List   Diagnosis Date Noted  . Abdominal pain 12/05/2019  . Right hip pain 12/05/2019  . Anxiety and depression 12/05/2019  . UTI (urinary tract infection) 12/05/2019  . Cholecystitis 12/05/2019  . Tobacco abuse 12/05/2019  . Polysubstance abuse ([place]) 12/05/2019  . Abscess_Intramuscular abscess in right piriformis muscle   . Pleural empyema ([place]) 08/12/2019  . Pneumonia 08/12/2019  . Empyema ([place])   . Osteomyelitis of sacrum ([place]) 08/09/2019  . MRSA bacteremia 08/04/2019  . Pain of right clavicle 08/04/2019  . Right hand pain 08/04/2019  . Malpositioned IUD 08/03/2019  . Septic shock ([place]) 08/03/2019  . Sepsis ([place]) 08/02/2019  . 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 Myositis 08/02/2019  . Hypokalemia 08/02/2019  . IUD migration 01/22/2015    Past Surgical History:  Procedure Laterality Date  . CESAREAN SECTION    . IR THORACENTESIS ASP PLEURAL SPACE W/IMG GUIDE  08/09/2019    Prior to Admission medications     Medication Sig Start Date End Date Taking? Authorizing Provider  baclofen (LIORESAL) 10 MG tablet Take 10 mg by mouth 3 (three) times daily as needed. 11/07/19   [provider]  clindamycin (CLEOCIN) 300 MG capsule Take 600 mg by mouth 3 (three) times daily. 11/07/19   [provider]  DULoxetine (CYMBALTA) 30 MG capsule Take 90 mg by mouth daily. 11/07/19   [provider]  gabapentin (NEURONTIN) 400 MG capsule Take 1,200 mg by mouth 3 (three) times daily. 11/07/19   [provider]  hydrOXYzine (ATARAX/VISTARIL) 25 MG tablet Take 50 mg by mouth every 6 (six) hours as needed. 11/07/19   [provider]  mirtazapine (REMERON) 7.5 MG tablet SMARTSIG:1 Tablet(s) By Mouth Every Evening 11/07/19   [provider]  NARCAN 4 MG/0.1ML LIQD nasal spray kit as directed. 11/07/19   [provider]  rifampin (RIFADIN) 300 MG capsule Take 600 mg by mouth daily. 11/07/19   [provider]  SUBOXONE 8-2 MG FILM Place 1 Film under the tongue in the morning, at noon, and at bedtime. 11/22/19   [provider]    Allergies Patient has no known allergies.  Family History  Problem Relation Age of Onset  . Depression Mother   . Bipolar disorder Mother   . Depression Father   . Bipolar disorder Father   . Depression Sister   . Bipolar disorder Sister     Social History Social History   Tobacco Use  .  Smoking status: Current Every Day Smoker    Packs/day: 1.00    Types: Cigarettes  . Smokeless tobacco: Never Used  Substance Use Topics  . Alcohol use: No    Alcohol/week: 0.0 standard drinks  . Drug use: No    Review of Systems  Constitutional: No fever/chills Eyes: No visual changes. ENT: No sore throat. Cardiovascular: Denies chest pain. Respiratory: Denies shortness of breath. Gastrointestinal: Positive for right upper quadrant pain with nausea.  No vomiting.  No diarrhea.  No constipation. Genitourinary: Negative for  dysuria. Musculoskeletal: Positive for right hip pain. Skin: Negative for rash. Neurological: Negative for headaches, focal weakness or numbness.    ____________________________________________   PHYSICAL EXAM:  VITAL SIGNS: ED Triage Vitals [12/13/19 0912]  Enc Vitals Group     BP 123/77     Pulse Rate 96     Resp 18     Temp 98.3 F (36.8 C)     Temp Source Oral     SpO2 98 %     Weight 180 lb (81.6 kg)     Height '5\' 1"'$  (1.549 m)     Head Circumference      Peak Flow      Pain Score 10     Pain Loc      Pain Edu?      Excl. in [place]?     Constitutional: Alert and oriented. No acute distress. Eyes: Conjunctivae are normal. Head: Atraumatic. Nose: No congestion/rhinnorhea. Mouth/Throat: Mucous membranes are moist.  Oropharynx non-erythematous. Neck: No stridor.   Cardiovascular: Normal rate, regular rhythm. Grossly normal heart sounds.  Good peripheral circulation. Respiratory: Normal respiratory effort.  No retractions. Lungs CTAB. Gastrointestinal: Soft and nontender. No distention. No abdominal bruits. No CVA tenderness. Genitourinary:  Musculoskeletal: No lower extremity tenderness nor edema.  No joint effusions. Neurologic:  Normal speech and language. No gross focal neurologic deficits are appreciated. No gait instability. Motor and sensory function of the right lower extremity is intact. Skin:  Skin is warm, dry and intact. No rash noted. Psychiatric: Anxious. Speech and behavior are normal.  ____________________________________________   LABS (all labs ordered are listed, but only abnormal results are displayed)  Labs Reviewed  COMPREHENSIVE METABOLIC PANEL - Abnormal; Notable for the following components:      Result Value   Glucose, Bld 120 (*)    Total Protein 8.4 (*)    Alkaline Phosphatase 147 (*)    All other components within normal limits  CBC - Abnormal; Notable for the following components:   RBC 5.34 (*)    MCV 75.3 (*)    MCH 25.7 (*)     All other components within normal limits  URINALYSIS, COMPLETE (UACMP) WITH MICROSCOPIC - Abnormal; Notable for the following components:   Color, Urine YELLOW (*)    APPearance HAZY (*)    Bacteria, UA RARE (*)    All other components within normal limits  LIPASE, BLOOD  POC URINE PREG, ED  POCT PREGNANCY, URINE   ____________________________________________  EKG  Not indicated. ____________________________________________  RADIOLOGY  ED MD interpretation:    Not indicated. I, Sherrie George, personally viewed and evaluated these images (plain radiographs) as part of my medical decision making, as well as reviewing the written report by the radiologist.  Official radiology report(s): No results found.  ____________________________________________   PROCEDURES  Procedure(s) performed (including Critical Care):  Procedures  ____________________________________________   INITIAL IMPRESSION / ASSESSMENT AND PLAN     34 year old female presenting to the  emergency department for treatment and evaluation of right upper quadrant pain and right hip pain.  See HPI for further details.  Protocol labs drawn while in the emergency department show a normal white blood cell count at 9.0 with a normal hemoglobin and hematocrit.  She has a normal total bilirubin.  Alkaline phosphatase is somewhat elevated however at her baseline.  Urinalysis is not concerning for acute cystitis and pregnancy test is negative.  Plan will be to review notes from her last hospital admission.  DIFFERENTIAL DIAGNOSIS  Increasing septic arthritis and/or osteoarthritis.  Acute cholecystitis, acute on chronic cholelithiasis  ED COURSE  Patient reports that 3 right upper quadrant pain has now pretty much resolved and is back to baseline.  She already has a surgery set up for 5 days.  No acute cholecystitis or acute abdomen.  Case discussed with Dr. Posey Pronto who is within the same practice as Dr. Rudene Christians.  Dr.  Rudene Christians evaluated the patient at her last admission.  At that time, she was advised that there was not a need for a surgical intervention due to the size of the abscess in the piriform plus the relationship to the sciatic nerve.  She is not currently having any neurological deficits.  No further images were recommended today.  He does recommend that she continue her clindamycin as prescribed.  From a in orthopedic standpoint, he states that she is stable for discharge.  Plan discussed with the patient.  She will receive prescription for clindamycin as she only has a few days left in her current bottle.  She is requesting pain medication.  She last filled her Suboxone at the end of November 22, 2019 and states that she no longer has a prescriber. I recommended that she take tylenol and ibuprofen in rotation.   Patient is to follow up with her surgeon as scheduled in 5 days. If she develops a fever or and neurological deficit, she is to return to the ER.  ____________________________________________   FINAL CLINICAL IMPRESSION(S) / ED DIAGNOSES  Final diagnoses:  Calculus of gallbladder without cholecystitis without obstruction  Pyogenic arthritis of right hip, due to unspecified organism [facility])     ED Discharge Orders    None       Saraiah Bhat was evaluated in Emergency Department on 12/13/2019 for the symptoms described in the history of present illness. She was evaluated in the context of the global COVID-19 pandemic, which necessitated consideration that the patient might be at risk for infection with the SARS-CoV-2 virus that causes COVID-19. Institutional protocols and algorithms that pertain to the evaluation of patients at risk for COVID-19 are in a state of rapid change based on information released by regulatory bodies including the CDC and federal and state organizations. These policies and algorithms were followed during the patient's care in the ED.   Note:  This document was prepared  using Dragon voice recognition software and may include unintentional dictation errors.   Victorino Dike, FNP 12/13/19 1546    Earleen Newport, MD 12/14/19 [phone]

## 2020-01-31 ENCOUNTER — Inpatient Hospital Stay: Payer: Medicaid Other

## 2020-01-31 ENCOUNTER — Encounter: Payer: Self-pay | Admitting: Internal Medicine

## 2020-01-31 ENCOUNTER — Emergency Department: Payer: Medicaid Other

## 2020-01-31 ENCOUNTER — Inpatient Hospital Stay
Admission: EM | Admit: 2020-01-31 | Discharge: 2020-02-03 | DRG: 896 | Disposition: A | Discharge status: Home / Self Care | Payer: Medicaid Other | Attending: Internal Medicine | Admitting: Internal Medicine

## 2020-01-31 ENCOUNTER — Other Ambulatory Visit: Payer: Self-pay

## 2020-01-31 DIAGNOSIS — Y900 Blood alcohol level of less than 20 mg/100 ml: Secondary | ICD-10-CM | POA: Diagnosis present

## 2020-01-31 DIAGNOSIS — L409 Psoriasis, unspecified: Secondary | ICD-10-CM | POA: Diagnosis not present

## 2020-01-31 DIAGNOSIS — Y762 Prosthetic and other implants, materials and accessory obstetric and gynecological devices associated with adverse incidents: Secondary | ICD-10-CM | POA: Diagnosis present

## 2020-01-31 DIAGNOSIS — R4182 Altered mental status, unspecified: Secondary | ICD-10-CM | POA: Diagnosis present

## 2020-01-31 DIAGNOSIS — S0083XA Contusion of other part of head, initial encounter: Secondary | ICD-10-CM | POA: Diagnosis present

## 2020-01-31 DIAGNOSIS — F1910 Other psychoactive substance abuse, uncomplicated: Secondary | ICD-10-CM | POA: Diagnosis not present

## 2020-01-31 DIAGNOSIS — Z79899 Other long term (current) drug therapy: Secondary | ICD-10-CM | POA: Diagnosis not present

## 2020-01-31 DIAGNOSIS — X58XXXA Exposure to other specified factors, initial encounter: Secondary | ICD-10-CM | POA: Diagnosis present

## 2020-01-31 DIAGNOSIS — R509 Fever, unspecified: Secondary | ICD-10-CM | POA: Diagnosis not present

## 2020-01-31 DIAGNOSIS — F1310 Sedative, hypnotic or anxiolytic abuse, uncomplicated: Secondary | ICD-10-CM | POA: Diagnosis present

## 2020-01-31 DIAGNOSIS — F17210 Nicotine dependence, cigarettes, uncomplicated: Secondary | ICD-10-CM | POA: Diagnosis present

## 2020-01-31 DIAGNOSIS — F1020 Alcohol dependence, uncomplicated: Secondary | ICD-10-CM | POA: Diagnosis present

## 2020-01-31 DIAGNOSIS — M6282 Rhabdomyolysis: Secondary | ICD-10-CM | POA: Diagnosis present

## 2020-01-31 DIAGNOSIS — G92 Toxic encephalopathy: Secondary | ICD-10-CM | POA: Diagnosis present

## 2020-01-31 DIAGNOSIS — F319 Bipolar disorder, unspecified: Secondary | ICD-10-CM | POA: Diagnosis not present

## 2020-01-31 DIAGNOSIS — F1510 Other stimulant abuse, uncomplicated: Secondary | ICD-10-CM | POA: Diagnosis present

## 2020-01-31 DIAGNOSIS — T8332XA Displacement of intrauterine contraceptive device, initial encounter: Secondary | ICD-10-CM | POA: Diagnosis present

## 2020-01-31 DIAGNOSIS — R410 Disorientation, unspecified: Secondary | ICD-10-CM

## 2020-01-31 DIAGNOSIS — Z046 Encounter for general psychiatric examination, requested by authority: Secondary | ICD-10-CM

## 2020-01-31 DIAGNOSIS — M47898 Other spondylosis, sacral and sacrococcygeal region: Secondary | ICD-10-CM | POA: Diagnosis not present

## 2020-01-31 DIAGNOSIS — T796XXD Traumatic ischemia of muscle, subsequent encounter: Secondary | ICD-10-CM | POA: Diagnosis not present

## 2020-01-31 DIAGNOSIS — B957 Other staphylococcus as the cause of diseases classified elsewhere: Secondary | ICD-10-CM | POA: Diagnosis present

## 2020-01-31 DIAGNOSIS — Z8614 Personal history of Methicillin resistant Staphylococcus aureus infection: Secondary | ICD-10-CM | POA: Diagnosis not present

## 2020-01-31 DIAGNOSIS — F419 Anxiety disorder, unspecified: Secondary | ICD-10-CM | POA: Diagnosis not present

## 2020-01-31 DIAGNOSIS — D72829 Elevated white blood cell count, unspecified: Secondary | ICD-10-CM | POA: Diagnosis not present

## 2020-01-31 DIAGNOSIS — Z8249 Family history of ischemic heart disease and other diseases of the circulatory system: Secondary | ICD-10-CM | POA: Diagnosis not present

## 2020-01-31 DIAGNOSIS — A419 Sepsis, unspecified organism: Secondary | ICD-10-CM | POA: Diagnosis not present

## 2020-01-31 DIAGNOSIS — R6510 Systemic inflammatory response syndrome (SIRS) of non-infectious origin without acute organ dysfunction: Secondary | ICD-10-CM | POA: Diagnosis not present

## 2020-01-31 DIAGNOSIS — Z20822 Contact with and (suspected) exposure to covid-19: Secondary | ICD-10-CM | POA: Diagnosis present

## 2020-01-31 DIAGNOSIS — T43621A Poisoning by amphetamines, accidental (unintentional), initial encounter: Secondary | ICD-10-CM | POA: Diagnosis not present

## 2020-01-31 DIAGNOSIS — E876 Hypokalemia: Secondary | ICD-10-CM | POA: Diagnosis present

## 2020-01-31 DIAGNOSIS — Z9119 Patient's noncompliance with other medical treatment and regimen: Secondary | ICD-10-CM

## 2020-01-31 DIAGNOSIS — Z6835 Body mass index (BMI) 35.0-35.9, adult: Secondary | ICD-10-CM

## 2020-01-31 DIAGNOSIS — Z9049 Acquired absence of other specified parts of digestive tract: Secondary | ICD-10-CM | POA: Diagnosis not present

## 2020-01-31 DIAGNOSIS — R000 Tachycardia, unspecified: Secondary | ICD-10-CM | POA: Diagnosis present

## 2020-01-31 HISTORY — DX: Bipolar disorder, unspecified: F31.9

## 2020-01-31 LAB — URINE DRUG SCREEN, QUALITATIVE (ARMC ONLY)
Amphetamines, Ur Screen: POSITIVE — AB
Barbiturates, Ur Screen: NOT DETECTED
Benzodiazepine, Ur Scrn: POSITIVE — AB
Cannabinoid 50 Ng, Ur ~~LOC~~: NOT DETECTED
Cocaine Metabolite,Ur ~~LOC~~: NOT DETECTED
MDMA (Ecstasy)Ur Screen: NOT DETECTED
Methadone Scn, Ur: NOT DETECTED
Opiate, Ur Screen: NOT DETECTED
Phencyclidine (PCP) Ur S: NOT DETECTED
Tricyclic, Ur Screen: NOT DETECTED

## 2020-01-31 LAB — CBC WITH DIFFERENTIAL/PLATELET
Abs Immature Granulocytes: 0.04 10*3/uL (ref 0.00–0.07)
Basophils Absolute: 0.1 10*3/uL (ref 0.0–0.1)
Basophils Relative: 0 %
Eosinophils Absolute: 0 10*3/uL (ref 0.0–0.5)
Eosinophils Relative: 0 %
HCT: 43.3 % (ref 36.0–46.0)
Hemoglobin: 15.1 g/dL — ABNORMAL HIGH (ref 12.0–15.0)
Immature Granulocytes: 0 %
Lymphocytes Relative: 8 %
Lymphs Abs: 1 10*3/uL (ref 0.7–4.0)
MCH: 26.5 pg (ref 26.0–34.0)
MCHC: 34.9 g/dL (ref 30.0–36.0)
MCV: 76.1 fL — ABNORMAL LOW (ref 80.0–100.0)
Monocytes Absolute: 0.8 10*3/uL (ref 0.1–1.0)
Monocytes Relative: 6 %
Neutro Abs: 11.2 10*3/uL — ABNORMAL HIGH (ref 1.7–7.7)
Neutrophils Relative %: 86 %
Platelets: 352 10*3/uL (ref 150–400)
RBC: 5.69 MIL/uL — ABNORMAL HIGH (ref 3.87–5.11)
RDW: 16.3 % — ABNORMAL HIGH (ref 11.5–15.5)
WBC: 13 10*3/uL — ABNORMAL HIGH (ref 4.0–10.5)
nRBC: 0 % (ref 0.0–0.2)

## 2020-01-31 LAB — URINALYSIS, COMPLETE (UACMP) WITH MICROSCOPIC
Bilirubin Urine: NEGATIVE
Glucose, UA: NEGATIVE mg/dL
Hgb urine dipstick: NEGATIVE
Ketones, ur: 20 mg/dL — AB
Leukocytes,Ua: NEGATIVE
Nitrite: NEGATIVE
Protein, ur: 100 mg/dL — AB
Specific Gravity, Urine: 1.031 — ABNORMAL HIGH (ref 1.005–1.030)
pH: 5 (ref 5.0–8.0)

## 2020-01-31 LAB — COMPREHENSIVE METABOLIC PANEL
ALT: 28 U/L (ref 0–44)
AST: 35 U/L (ref 15–41)
Albumin: 5.3 g/dL — ABNORMAL HIGH (ref 3.5–5.0)
Alkaline Phosphatase: 172 U/L — ABNORMAL HIGH (ref 38–126)
Anion gap: 16 — ABNORMAL HIGH (ref 5–15)
BUN: 19 mg/dL (ref 6–20)
CO2: 20 mmol/L — ABNORMAL LOW (ref 22–32)
Calcium: 9.6 mg/dL (ref 8.9–10.3)
Chloride: 103 mmol/L (ref 98–111)
Creatinine, Ser: 1.23 mg/dL — ABNORMAL HIGH (ref 0.44–1.00)
GFR calc Af Amer: >60 mL/min (ref >60)
GFR calc non Af Amer: 57 mL/min — ABNORMAL LOW (ref >60)
Glucose, Bld: 124 mg/dL — ABNORMAL HIGH (ref 70–99)
Potassium: 2.8 mmol/L — ABNORMAL LOW (ref 3.5–5.1)
Sodium: 139 mmol/L (ref 135–145)
Total Bilirubin: 1.3 mg/dL — ABNORMAL HIGH (ref 0.3–1.2)
Total Protein: 9.6 g/dL — ABNORMAL HIGH (ref 6.5–8.1)

## 2020-01-31 LAB — SALICYLATE LEVEL: Salicylate Lvl: <7 mg/dL — ABNORMAL LOW (ref 7.0–30.0)

## 2020-01-31 LAB — SARS CORONAVIRUS 2 BY RT PCR (HOSPITAL ORDER, PERFORMED IN ~~LOC~~ HOSPITAL LAB): SARS Coronavirus 2: NEGATIVE

## 2020-01-31 LAB — PROTIME-INR
INR: 1.2 (ref 0.8–1.2)
Prothrombin Time: 14.3 seconds (ref 11.4–15.2)

## 2020-01-31 LAB — ETHANOL: Alcohol, Ethyl (B): <10 mg/dL (ref <10)

## 2020-01-31 LAB — HCG, QUANTITATIVE, PREGNANCY: hCG, Beta Chain, Quant, S: <1 m[IU]/mL (ref <5)

## 2020-01-31 LAB — LACTIC ACID, PLASMA
Lactic Acid, Venous: 2.3 mmol/L (ref 0.5–1.9)
Lactic Acid, Venous: 0.7 mmol/L (ref 0.5–1.9)

## 2020-01-31 LAB — CK: Total CK: 604 U/L — ABNORMAL HIGH (ref 38–234)

## 2020-01-31 LAB — ACETAMINOPHEN LEVEL: Acetaminophen (Tylenol), Serum: <10 ug/mL — ABNORMAL LOW (ref 10–30)

## 2020-01-31 LAB — MAGNESIUM: Magnesium: 2 mg/dL (ref 1.7–2.4)

## 2020-01-31 LAB — PHOSPHORUS: Phosphorus: 2.6 mg/dL (ref 2.5–4.6)

## 2020-01-31 LAB — PROCALCITONIN: Procalcitonin: <0.1 ng/mL

## 2020-01-31 MED ORDER — THIAMINE HCL 100 MG/ML IJ SOLN
100.0000 mg | Freq: Every day | INTRAMUSCULAR | Status: DC
  Filled 2020-01-31: qty 2

## 2020-01-31 MED ORDER — HALOPERIDOL 5 MG PO TABS
5.0000 mg | ORAL_TABLET | Freq: Once | ORAL | Status: AC
  Administered 2020-01-31: 5 mg via ORAL
  Filled 2020-01-31: qty 1

## 2020-01-31 MED ORDER — VANCOMYCIN HCL IN DEXTROSE 750-5 MG/150ML-% IV SOLN
750.0000 mg | Freq: Two times a day (BID) | INTRAVENOUS | Status: DC
  Administered 2020-01-31: 750 mg via INTRAVENOUS
  Filled 2020-01-31 (×3): qty 150

## 2020-01-31 MED ORDER — POTASSIUM CHLORIDE CRYS ER 20 MEQ PO TBCR
40.0000 meq | EXTENDED_RELEASE_TABLET | Freq: Once | ORAL | Status: AC
  Administered 2020-01-31: 40 meq via ORAL
  Filled 2020-01-31: qty 2

## 2020-01-31 MED ORDER — MIDAZOLAM HCL 2 MG/2ML IJ SOLN: 4.0000 mg | Freq: Once | INTRAMUSCULAR | Status: AC

## 2020-01-31 MED ORDER — ONDANSETRON HCL 4 MG/2ML IJ SOLN: 4.0000 mg | Freq: Four times a day (QID) | INTRAMUSCULAR | Status: DC | PRN

## 2020-01-31 MED ORDER — ONDANSETRON HCL 4 MG PO TABS: 4.0000 mg | ORAL_TABLET | Freq: Four times a day (QID) | ORAL | Status: DC | PRN

## 2020-01-31 MED ORDER — THIAMINE HCL 100 MG PO TABS
100.0000 mg | ORAL_TABLET | Freq: Every day | ORAL | Status: DC
  Administered 2020-01-31 – 2020-02-03 (×4): 100 mg via ORAL
  Filled 2020-01-31 (×4): qty 1

## 2020-01-31 MED ORDER — VANCOMYCIN HCL IN DEXTROSE 1-5 GM/200ML-% IV SOLN
1000.0000 mg | Freq: Once | INTRAVENOUS | Status: DC
  Filled 2020-01-31: qty 200

## 2020-01-31 MED ORDER — VANCOMYCIN HCL IN DEXTROSE 1-5 GM/200ML-% IV SOLN
1000.0000 mg | Freq: Once | INTRAVENOUS | Status: AC
  Administered 2020-01-31: 1000 mg via INTRAVENOUS
  Filled 2020-01-31: qty 200

## 2020-01-31 MED ORDER — NICOTINE 21 MG/24HR TD PT24
21.0000 mg | MEDICATED_PATCH | Freq: Every day | TRANSDERMAL | Status: DC
  Administered 2020-02-01 – 2020-02-03 (×3): 21 mg via TRANSDERMAL
  Filled 2020-01-31 (×3): qty 1

## 2020-01-31 MED ORDER — MIDAZOLAM HCL 2 MG/2ML IJ SOLN
INTRAMUSCULAR | Status: AC
  Administered 2020-01-31: 4 mg via INTRAMUSCULAR
  Filled 2020-01-31: qty 4

## 2020-01-31 MED ORDER — SODIUM CHLORIDE 0.9 % IV SOLN
2.0000 g | Freq: Once | INTRAVENOUS | Status: AC
  Administered 2020-01-31: 2 g via INTRAVENOUS
  Filled 2020-01-31: qty 2

## 2020-01-31 MED ORDER — IOHEXOL 9 MG/ML PO SOLN
500.0000 mL | ORAL | Status: AC
  Administered 2020-01-31 (×2): 500 mL via ORAL

## 2020-01-31 MED ORDER — ENOXAPARIN SODIUM 40 MG/0.4ML ~~LOC~~ SOLN
40.0000 mg | SUBCUTANEOUS | Status: DC
  Administered 2020-02-01 – 2020-02-03 (×3): 40 mg via SUBCUTANEOUS
  Filled 2020-01-31 (×3): qty 0.4

## 2020-01-31 MED ORDER — SODIUM CHLORIDE 0.9 % IV BOLUS
1000.0000 mL | Freq: Once | INTRAVENOUS | Status: AC
  Administered 2020-01-31: 1000 mL via INTRAVENOUS

## 2020-01-31 MED ORDER — POTASSIUM CHLORIDE 20 MEQ/15ML (10%) PO SOLN
40.0000 meq | Freq: Once | ORAL | Status: AC
  Administered 2020-01-31: 40 meq via ORAL
  Filled 2020-01-31: qty 30

## 2020-01-31 MED ORDER — LORAZEPAM 1 MG PO TABS
1.0000 mg | ORAL_TABLET | ORAL | Status: DC | PRN
  Administered 2020-02-01 (×2): 1 mg via ORAL
  Administered 2020-02-01: 2 mg via ORAL
  Administered 2020-02-01: 1 mg via ORAL
  Filled 2020-01-31 (×2): qty 1
  Filled 2020-01-31: qty 2
  Filled 2020-01-31: qty 1

## 2020-01-31 MED ORDER — POTASSIUM CHLORIDE IN NACL 40-0.9 MEQ/L-% IV SOLN
INTRAVENOUS | Status: DC
  Filled 2020-01-31 (×3): qty 1000

## 2020-01-31 MED ORDER — ACETAMINOPHEN 325 MG PO TABS
650.0000 mg | ORAL_TABLET | Freq: Four times a day (QID) | ORAL | Status: DC | PRN
  Administered 2020-01-31 – 2020-02-03 (×7): 650 mg via ORAL
  Filled 2020-01-31 (×7): qty 2

## 2020-01-31 MED ORDER — METRONIDAZOLE IN NACL 5-0.79 MG/ML-% IV SOLN
500.0000 mg | Freq: Three times a day (TID) | INTRAVENOUS | Status: DC
  Administered 2020-01-31 – 2020-02-02 (×7): 500 mg via INTRAVENOUS
  Filled 2020-01-31 (×10): qty 100

## 2020-01-31 MED ORDER — VANCOMYCIN HCL IN DEXTROSE 1-5 GM/200ML-% IV SOLN
1000.0000 mg | Freq: Once | INTRAVENOUS | Status: AC
  Administered 2020-01-31: 1000 mg via INTRAVENOUS

## 2020-01-31 MED ORDER — GABAPENTIN 300 MG PO CAPS
300.0000 mg | ORAL_CAPSULE | Freq: Three times a day (TID) | ORAL | Status: DC
  Administered 2020-01-31 – 2020-02-03 (×9): 300 mg via ORAL
  Filled 2020-01-31 (×9): qty 1

## 2020-01-31 MED ORDER — SODIUM CHLORIDE 0.9 % IV SOLN
2.0000 g | Freq: Three times a day (TID) | INTRAVENOUS | Status: DC
  Administered 2020-01-31 – 2020-02-02 (×6): 2 g via INTRAVENOUS
  Filled 2020-01-31 (×9): qty 2

## 2020-01-31 MED ORDER — ACETAMINOPHEN 650 MG RE SUPP: 650.0000 mg | Freq: Four times a day (QID) | RECTAL | Status: DC | PRN

## 2020-01-31 MED ORDER — IOHEXOL 350 MG/ML SOLN
100.0000 mL | Freq: Once | INTRAVENOUS | Status: AC | PRN
  Administered 2020-01-31: 100 mL via INTRAVENOUS

## 2020-01-31 MED ORDER — ADULT MULTIVITAMIN W/MINERALS CH
1.0000 | ORAL_TABLET | Freq: Every day | ORAL | Status: DC
  Administered 2020-01-31 – 2020-02-03 (×4): 1 via ORAL
  Filled 2020-01-31 (×4): qty 1

## 2020-01-31 MED ORDER — FOLIC ACID 1 MG PO TABS
1.0000 mg | ORAL_TABLET | Freq: Every day | ORAL | Status: DC
  Administered 2020-01-31 – 2020-02-03 (×4): 1 mg via ORAL
  Filled 2020-01-31 (×4): qty 1

## 2020-01-31 MED ORDER — MIDAZOLAM HCL 2 MG/2ML IJ SOLN: 4.0000 mg | Freq: Once | INTRAMUSCULAR | Status: DC

## 2020-01-31 MED ORDER — LORAZEPAM 2 MG/ML IJ SOLN
1.0000 mg | INTRAMUSCULAR | Status: DC | PRN
  Administered 2020-01-31 – 2020-02-01 (×4): 2 mg via INTRAVENOUS
  Filled 2020-01-31 (×4): qty 1

## 2020-01-31 NOTE — ED Notes (Signed)
PT  PLACED UNDER  IVC PAPERS  INFORMED  BILL  RN  AND  STEPAHINE  CHARGE  NURSE

## 2020-01-31 NOTE — BH Assessment (Addendum)
Spoke with patient, along with psych NP Marikay Alar.  Patient reports recent relapse on meth and Xanax yesterday after 7 months of sobriety due to family issues.  Pt Denies SI/HI/AV/H. Patient presented as twitchy and had a disheveled appearance upon interview. Patient will be provided with outpatient resources as she needs to resolve her upcoming legal issues/court dates on 9/16.    Brita Romp, Many

## 2020-01-31 NOTE — Consult Note (Signed)
Pharmacy Antibiotic Note  Leslie Molina is a 34 y.o. female with history of polysubstance abuse admitted on 01/31/2020 with altered mental status / sepsis. Patient is febrile with leukocytosis, no left shift on differential. Procalcitonin < 0.10. Per chart, patient reported remote history of MRSA bone infection in March. Pharmacy has been consulted for vancomycin and cefepime dosing.  Plan: Cefepime 2 g IV q8h  Vancomycin 2 g IV LD  Followed by maintenance regimen of vancomycin 750 mg q12h  Scr 1.23 (un-clear baseline). Daily Scr per protocol. Pharmacy will continue to follow and adjust antibiotics per renal function as indicated. Levels at steady state if prolonged duration of therapy anticipated.   Height: 5\' 6"  (167.6 cm) Weight: 99.8 kg (220 lb) IBW/kg (Calculated) : 59.3  Temp (24hrs), Avg:100.2 F (37.9 C), Min:97.8 F (36.6 C), Max:102 F (38.9 C)  Recent Labs  Lab 01/31/20 0918  WBC 13.0*  CREATININE 1.23*  LATICACIDVEN 2.3*    Estimated Creatinine Clearance: 76.1 mL/min (A) (by C-G formula based on SCr of 1.23 mg/dL (H)).    Not on File  Antimicrobials this admission: Metronidazole 9/1 >>  Cefepime 9/1 >>  Vancomycin 9/1 >>   Dose adjustments this admission: n/a  Microbiology results: 9/1 BCx: pending  Thank you for allowing pharmacy to be a part of this patient's care.  Benita Gutter 01/31/2020 12:29 PM

## 2020-01-31 NOTE — ED Triage Notes (Signed)
Pt comes into the ED via EMS with hand cuff in place, report pt was found running around a parking lot banging her head on things and a danger to herself. Pt has a shirt bra and under wear on arrival. Pt has abrasions all over her extremities and face, pt is clammy and fidgety

## 2020-01-31 NOTE — Consult Note (Addendum)
Patient seen and evaluated in person by this provider and [facility] specialist, Jazmine Fulkcon.  She reports relapsing on meth and Xanax yesterday after 7 months of sobriety due to family issues.  Denies suicidal/homicidal ideations, hallucinations, mania, or other psychiatric concerns.  She is restless and having difficulty clearing the effects of the meth.  Medications provided and waiting clears from the drugs, she can be discharged with outpatient resources after medical clearance.  Recommend rehab but client has to resolve her court dates on 916 prior to rehab placement per their protocols.  Waylan Boga, [facility] NP

## 2020-01-31 NOTE — Consult Note (Signed)
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 female patient admitted with sepsis r/t substance abuse.  Patient seen and evaluated in person by this provider and [facility] specialist, Jazmine Fulkcon.  She reports relapsing on meth and Xanax yesterday after 7 months of sobriety due to family issues.  Denies suicidal/homicidal ideations, hallucinations, mania, or other psychiatric concerns.  She is restless and having difficulty clearing the effects of the meth.  Medications provided and waiting clears from the drugs, she can be discharged with outpatient resources after medical clearance.  Recommend rehab but client has to resolve her court dates on 916 prior to rehab placement per their protocols.  Psychiatrically cleared.  Past Psychiatric History: polysubstance use d/o, anxiety  Risk to Self:  none Risk to Others:   none Prior Inpatient Therapy:  yes Prior Outpatient Therapy:  yes  Past Medical History:  Past Medical History:  Diagnosis Date  . Anxiety   . Bipolar disorder ([place])     Family History:  Family History  Problem Relation Age of Onset  . Hypertension Mother   . Hypertension Father    Family Psychiatric  History: none Social History:  Social History   Substance and Sexual Activity  Alcohol Use Yes     Social History   Substance and Sexual Activity  Drug Use Yes  . Types: Methamphetamines    Social History   Socioeconomic History  . Marital status: Single    Spouse name: Not on  file  . Number of children: Not on file  . Years of education: Not on file  . Highest education level: Not on file  Occupational History  . Not on file  Tobacco Use  . Smoking status: Current Every Day Smoker    Packs/day: 1.00    Types: Cigarettes  Substance and Sexual Activity  . Alcohol use: Yes  . Drug use: Yes    Types: Methamphetamines  . Sexual activity: Not on file  Other Topics Concern  . Not on file  Social History Narrative  . Not on file   Social Determinants of Health   Financial Resource Strain:   . Difficulty of Paying Living Expenses: Not on file  Food Insecurity:   . Worried About Charity fundraiser in the Last Year: Not on file  . Ran Out of Food in the Last Year: Not on file  Transportation Needs:   . Lack of Transportation (Medical): Not on file  . Lack of Transportation (Non-Medical): Not on file  Physical Activity:   . Days of Exercise per Week: Not on file  . Minutes of Exercise per Session: Not on file  Stress:   . Feeling of Stress : Not on file  Social Connections:   . Frequency of Communication with Friends and Family: Not on file  . Frequency of Social Gatherings with Friends and Family: Not on file  . Attends Religious Services: Not on file  . Active Member of Clubs or Organizations: Not on file  . Attends Club or  Organization Meetings: Not on file  . Marital Status: Not on file   Additional Social History:    Allergies:  Not on File  Labs:  Results for orders placed or performed during the hospital encounter of 01/31/20 (from the past 48 hour(s))  SARS Coronavirus 2 by RT PCR (hospital order, performed in [facility] hospital lab) Nasopharyngeal Nasopharyngeal Swab     Status: None   Collection Time: 01/31/20  9:18 AM   Specimen: Nasopharyngeal Swab  Result Value Ref Range   SARS Coronavirus 2 NEGATIVE NEGATIVE    Comment: (NOTE) SARS-CoV-2 target nucleic acids are NOT DETECTED.  The SARS-CoV-2 RNA is generally detectable in  upper and lower respiratory specimens during the acute phase of infection. The lowest concentration of SARS-CoV-2 viral copies this assay can detect is 250 copies / mL. A negative result does not preclude SARS-CoV-2 infection and should not be used as the sole basis for treatment or other patient management decisions.  A negative result may occur with improper specimen collection / handling, submission of specimen other than nasopharyngeal swab, presence of viral mutation(s) within the areas targeted by this assay, and inadequate number of viral copies (<250 copies / mL). A negative result must be combined with clinical observations, patient history, and epidemiological information.  Fact Sheet for Patients:   StrictlyIdeas.no  Fact Sheet for Healthcare Providers: BankingDealers.co.za  This test is not yet approved or  cleared by the [place] FDA and has been authorized for detection and/or diagnosis of SARS-CoV-2 by FDA under an Emergency Use Authorization (EUA).  This EUA will remain in effect (meaning this test can be used) for the duration of the COVID-19 declaration under Section 564(b)(1) of the Act, 21 U.S.C. section 360bbb-3(b)(1), unless the authorization is terminated or revoked sooner.  Performed at [facility], [address] [place], [place] [address]   Comprehensive metabolic panel     Status: Abnormal   Collection Time: 01/31/20  9:18 AM  Result Value Ref Range   Sodium 139 135 - 145 mmol/L   Potassium 2.8 (L) 3.5 - 5.1 mmol/L   Chloride 103 98 - 111 mmol/L   CO2 20 (L) 22 - 32 mmol/L   Glucose, Bld 124 (H) 70 - 99 mg/dL    Comment: Glucose reference range applies only to samples taken after fasting for at least 8 hours.   BUN 19 6 - 20 mg/dL   Creatinine, Ser 1.23 (H) 0.44 - 1.00 mg/dL   Calcium 9.6 8.9 - 10.3 mg/dL   Total Protein 9.6 (H) 6.5 - 8.1 g/dL   Albumin 5.3 (H) 3.5 - 5.0 g/dL   AST 35  15 - 41 U/L   ALT 28 0 - 44 U/L   Alkaline Phosphatase 172 (H) 38 - 126 U/L   Total Bilirubin 1.3 (H) 0.3 - 1.2 mg/dL   GFR calc non Af Amer 57 (L) >60 mL/min   GFR calc Af Amer >60 >60 mL/min   Anion gap 16 (H) 5 - 15    Comment: Performed at [facility], [address] [place], [place] [address]  Ethanol     Status: None   Collection Time: 01/31/20  9:18 AM  Result Value Ref Range   Alcohol, Ethyl (B) <10 <10 mg/dL    Comment: (NOTE) Lowest detectable limit for serum alcohol is 10 mg/dL.  For medical purposes only. Performed at [facility], [address] [place], [place] [address]   CBC with [facility]  Status: Abnormal   Collection Time: 01/31/20  9:18 AM  Result Value Ref Range   WBC 13.0 (H) 4.0 - 10.5 K/uL   RBC 5.69 (H) 3.87 - 5.11 MIL/uL   Hemoglobin 15.1 (H) 12.0 - 15.0 g/dL   HCT 43.3 36 - 46 %   MCV 76.1 (L) 80.0 - 100.0 fL   MCH 26.5 26.0 - 34.0 pg   MCHC 34.9 30.0 - 36.0 g/dL   RDW 16.3 (H) 11.5 - 15.5 %   Platelets 352 150 - 400 K/uL   nRBC 0.0 0.0 - 0.2 %   Neutrophils Relative % 86 %   Neutro Abs 11.2 (H) 1.7 - 7.7 K/uL   Lymphocytes Relative 8 %   Lymphs Abs 1.0 0.7 - 4.0 K/uL   Monocytes Relative 6 %   Monocytes Absolute 0.8 0 - 1 K/uL   Eosinophils Relative 0 %   Eosinophils Absolute 0.0 0 - 0 K/uL   Basophils Relative 0 %   Basophils Absolute 0.1 0 - 0 K/uL   Immature Granulocytes 0 %   Abs Immature Granulocytes 0.04 0.00 - 0.07 K/uL    Comment: Performed at [facility], [address] [place], [place] [address]  Salicylate level     Status: Abnormal   Collection Time: 01/31/20  9:18 AM  Result Value Ref Range   Salicylate Lvl <3.5 (L) 7.0 - 30.0 mg/dL    Comment: Performed at [facility], [address] [place], [place] [address]  Acetaminophen level     Status: Abnormal   Collection Time: 01/31/20  9:18 AM  Result Value Ref Range   Acetaminophen (Tylenol), Serum <10 (L) 10 - 30 ug/mL    Comment:  (NOTE) Therapeutic concentrations vary significantly. A range of 10-30 ug/mL  may be an effective concentration for many patients. However, some  are best treated at concentrations outside of this range. Acetaminophen concentrations >150 ug/mL at 4 hours after ingestion  and >50 ug/mL at 12 hours after ingestion are often associated with  toxic reactions.  Performed at [facility], [address] [place], [place] [address]   CK     Status: Abnormal   Collection Time: 01/31/20  9:18 AM  Result Value Ref Range   Total CK 604 (H) 38.0 - 234.0 U/L    Comment: Performed at [facility], [address] [place], [place] [address]  hCG, quantitative, pregnancy     Status: None   Collection Time: 01/31/20  9:18 AM  Result Value Ref Range   hCG, Beta Chain, Quant, S <1 <5 mIU/mL    Comment:          GEST. AGE      CONC.  (mIU/mL)   <=1 WEEK        5 - 50     2 WEEKS       50 - 500     3 WEEKS       100 - 10,000     4 WEEKS     1,000 - 30,000     5 WEEKS     3,500 - 115,000   6-8 WEEKS     12,000 - 270,000    12 WEEKS     15,000 - 220,000        FEMALE AND NON-PREGNANT FEMALE:     LESS THAN 5 mIU/mL Performed at [facility], [place], [place] [address]   Lactic acid, plasma  Status: Abnormal   Collection Time: 01/31/20  9:18 AM  Result Value Ref Range   Lactic Acid, Venous 2.3 (HH) 0.5 - 1.9 mmol/L    Comment: CRITICAL RESULT CALLED TO, READ BACK BY AND VERIFIED WITH BILL SMITH ON 01/31/20 AT 0947 QSD Performed at [facility], [address] [place], [place] [address]   Protime-INR     Status: None   Collection Time: 01/31/20  9:18 AM  Result Value Ref Range   Prothrombin Time 14.3 11.4 - 15.2 seconds   INR 1.2 0.8 - 1.2    Comment: (NOTE) INR goal varies based on device and disease states. Performed at [facility], [address] [place], [place] [address]   Blood Culture (routine x 2)     Status: None  (Preliminary result)   Collection Time: 01/31/20  9:18 AM   Specimen: BLOOD  Result Value Ref Range   Specimen Description BLOOD RIGHT ASSIST CONTROL    Special Requests      BOTTLES DRAWN AEROBIC AND ANAEROBIC Blood Culture results may not be optimal due to an excessive volume of blood received in culture bottles   Culture      NO GROWTH < 12 HOURS Performed at [facility], [address], [place], [place] [address]    Report Status PENDING   Procalcitonin - Baseline     Status: None   Collection Time: 01/31/20  9:18 AM  Result Value Ref Range   Procalcitonin <0.10 ng/mL    Comment:        Interpretation: PCT (Procalcitonin) <= 0.5 ng/mL: Systemic infection (sepsis) is not likely. Local bacterial infection is possible. (NOTE)       Sepsis PCT Algorithm           Lower Respiratory Tract                                      Infection PCT Algorithm    ----------------------------     ----------------------------         PCT < 0.25 ng/mL                PCT < 0.10 ng/mL          Strongly encourage             Strongly discourage   discontinuation of antibiotics    initiation of antibiotics    ----------------------------     -----------------------------       PCT 0.25 - 0.50 ng/mL            PCT 0.10 - 0.25 ng/mL               OR       >80% decrease in PCT            Discourage initiation of                                            antibiotics      Encourage discontinuation           of antibiotics    ----------------------------     -----------------------------         PCT >= 0.50 ng/mL              PCT 0.26 - 0.50  ng/mL               AND        <80% decrease in PCT             Encourage initiation of                                             antibiotics       Encourage continuation           of antibiotics    ----------------------------     -----------------------------        PCT >= 0.50 ng/mL                  PCT > 0.50 ng/mL               AND          increase in PCT                  Strongly encourage                                      initiation of antibiotics    Strongly encourage escalation           of antibiotics                                     -----------------------------                                           PCT <= 0.25 ng/mL                                                 OR                                        > 80% decrease in PCT                                      Discontinue / Do not initiate                                             antibiotics  Performed at [facility], [address], [address] [place], [place] [address]   Blood Culture (routine x 2)     Status: None (Preliminary result)   Collection Time: 01/31/20  9:19 AM   Specimen: BLOOD  Result Value Ref Range   Specimen Description BLOOD LEFT HAND    Special Requests      BOTTLES DRAWN AEROBIC AND ANAEROBIC Blood Culture adequate volume   Culture      NO GROWTH < 12 HOURS Performed  at [facility], [address] [place], [place] [address]    Report Status PENDING   Urine Drug Screen, Qualitative     Status: Abnormal   Collection Time: 01/31/20 11:07 AM  Result Value Ref Range   Tricyclic, Ur Screen NONE DETECTED NONE DETECTED   Amphetamines, Ur Screen POSITIVE (A) NONE DETECTED   MDMA (Ecstasy)Ur Screen NONE DETECTED NONE DETECTED   Cocaine Metabolite,Ur [place] NONE DETECTED NONE DETECTED   Opiate, Ur Screen NONE DETECTED NONE DETECTED   Phencyclidine (PCP) Ur S NONE DETECTED NONE DETECTED   Cannabinoid 50 Ng, Ur [place] NONE DETECTED NONE DETECTED   Barbiturates, Ur Screen NONE DETECTED NONE DETECTED   Benzodiazepine, Ur Scrn POSITIVE (A) NONE DETECTED   Methadone Scn, Ur NONE DETECTED NONE DETECTED    Comment: (NOTE) Tricyclics + metabolites, urine    Cutoff 1000 ng/mL Amphetamines + metabolites, urine  Cutoff 1000 ng/mL MDMA (Ecstasy), urine              Cutoff 500 ng/mL Cocaine Metabolite, urine          Cutoff 300  ng/mL Opiate + metabolites, urine        Cutoff 300 ng/mL Phencyclidine (PCP), urine         Cutoff 25 ng/mL Cannabinoid, urine                 Cutoff 50 ng/mL Barbiturates + metabolites, urine  Cutoff 200 ng/mL Benzodiazepine, urine              Cutoff 200 ng/mL Methadone, urine                   Cutoff 300 ng/mL  The urine drug screen provides only a preliminary, unconfirmed analytical test result and should not be used for non-medical purposes. Clinical consideration and professional judgment should be applied to any positive drug screen result due to possible interfering substances. A more specific alternate chemical method must be used in order to obtain a confirmed analytical result. Gas chromatography / mass spectrometry (GC/MS) is the preferred confirm atory method. Performed at [facility], [address] [place], [place] [address]   Urinalysis, Complete w Microscopic     Status: Abnormal   Collection Time: 01/31/20 11:07 AM  Result Value Ref Range   Color, Urine AMBER (A) YELLOW    Comment: BIOCHEMICALS MAY BE AFFECTED BY COLOR   APPearance HAZY (A) CLEAR   Specific Gravity, Urine 1.031 (H) 1.005 - 1.030   pH 5.0 5.0 - 8.0   Glucose, UA NEGATIVE NEGATIVE mg/dL   Hgb urine dipstick NEGATIVE NEGATIVE   Bilirubin Urine NEGATIVE NEGATIVE   Ketones, ur 20 (A) NEGATIVE mg/dL   Protein, ur 100 (A) NEGATIVE mg/dL   Nitrite NEGATIVE NEGATIVE   Leukocytes,Ua NEGATIVE NEGATIVE   RBC / HPF 0-5 0 - 5 RBC/hpf   WBC, UA 6-10 0 - 5 WBC/hpf   Bacteria, UA RARE (A) NONE SEEN   Squamous Epithelial / LPF 0-5 0 - 5   Mucus PRESENT    Hyaline Casts, UA PRESENT     Comment: Performed at [facility], [address] [place], [place] [address]    Current Facility-Administered Medications  Medication Dose Route Frequency Provider Last Rate Last Admin  . 0.9 % NaCl with KCl 40 mEq / L  infusion   Intravenous Continuous Agbata, Tochukwu, MD 125 mL/hr at 01/31/20 1403  New Bag at 01/31/20 1403  . acetaminophen (TYLENOL) tablet 650 mg  650 mg Oral Q6H PRN Agbata, Tochukwu, MD   650 mg at 01/31/20 1351   Or  . acetaminophen (TYLENOL) suppository 650 mg  650 mg Rectal Q6H PRN Agbata, Tochukwu, MD      . ceFEPIme (MAXIPIME) 2 g in sodium chloride 0.9 % 100 mL IVPB  2 g Intravenous Q8H Chappell, Alex B, RPH      . enoxaparin (LOVENOX) injection 40 mg  40 mg Subcutaneous Q24H Agbata, Tochukwu, MD      . folic acid (FOLVITE) tablet 1 mg  1 mg Oral Daily Agbata, Tochukwu, MD   1 mg at 01/31/20 1353  . gabapentin (NEURONTIN) capsule 300 mg  300 mg Oral TID Patrecia Pour, NP   300 mg at 01/31/20 1353  . LORazepam (ATIVAN) tablet 1-4 mg  1-4 mg Oral Q1H PRN Collier Bullock, MD       Or  . LORazepam (ATIVAN) injection 1-4 mg  1-4 mg Intravenous Q1H PRN Agbata, Tochukwu, MD   2 mg at 01/31/20 1358  . metroNIDAZOLE (FLAGYL) IVPB 500 mg  500 mg Intravenous Q8H Agbata, Tochukwu, MD 100 mL/hr at 01/31/20 1538 500 mg at 01/31/20 1538  . midazolam (VERSED) injection 4 mg  4 mg Intramuscular Once Delman Kitten, MD      . multivitamin with minerals tablet 1 tablet  1 tablet Oral Daily Agbata, Tochukwu, MD   1 tablet at 01/31/20 1351  . nicotine (NICODERM CQ - dosed in mg/24 hours) patch 21 mg  21 mg Transdermal Daily Agbata, Tochukwu, MD      . ondansetron (ZOFRAN) tablet 4 mg  4 mg Oral Q6H PRN Agbata, Tochukwu, MD       Or  . ondansetron (ZOFRAN) injection 4 mg  4 mg Intravenous Q6H PRN Agbata, Tochukwu, MD      . potassium chloride 20 MEQ/15ML (10%) solution 40 mEq  40 mEq Oral Once Delman Kitten, MD      . thiamine tablet 100 mg  100 mg Oral Daily Agbata, Tochukwu, MD   100 mg at 01/31/20 1352   Or  . thiamine (B-1) injection 100 mg  100 mg Intravenous Daily Agbata, Tochukwu, MD      . vancomycin (VANCOCIN) IVPB 1000 mg/200 mL premix  1,000 mg Intravenous Once Benita Gutter, RPH      . vancomycin (VANCOCIN) IVPB 750 mg/150 ml premix  750 mg Intravenous Q12H Benita Gutter, [facility]       Current Outpatient Medications  Medication Sig Dispense Refill  . baclofen (LIORESAL) 10 MG tablet Take 10 mg by mouth 3 (three) times daily as needed for muscle spasms.    . hydrOXYzine (ATARAX/VISTARIL) 25 MG tablet Take 50 mg by mouth every 6 (six) hours as needed for anxiety.    . DULoxetine (CYMBALTA) 30 MG capsule Take 90 mg by mouth daily.    Marland Kitchen gabapentin (NEURONTIN) 400 MG capsule Take 1,200 mg by mouth 3 (three) times daily.    . mirtazapine (REMERON) 7.5 MG tablet Take 7.5 mg by mouth at bedtime.    . SUBOXONE 8-2 MG FILM Place 1 Film under the tongue 3 (three) times daily.      Musculoskeletal: Strength & Muscle Tone: within normal limits Gait & Station: normal Patient leans: N/A  Psychiatric Specialty Exam: Physical Exam Vitals and nursing note reviewed.  Constitutional:      Appearance: Normal appearance.  HENT:     Head: Normocephalic.     Nose: Nose normal.  Musculoskeletal:  General: Normal range of motion.     Cervical back: Normal range of motion.  Neurological:     General: No focal deficit present.     Mental Status: She is alert and oriented to person, place, and time.  Psychiatric:        Attention and Perception: Attention and perception normal.        Mood and Affect: Mood is anxious.        Speech: Speech normal.        Behavior: Behavior normal. Behavior is cooperative.        Thought Content: Thought content normal.        Cognition and Memory: Cognition and memory normal.        Judgment: Judgment normal.     Review of Systems  Psychiatric/Behavioral: The patient is nervous/anxious.   All other systems reviewed and are negative.   Blood pressure 107/68, pulse (!) 107, temperature (!) 100.8 F (38.2 C), temperature source Oral, resp. rate (!) 23, height 5\' 6"  (1.676 m), weight 99.8 kg, SpO2 99 %.Body mass index is 35.51 kg/m.  General Appearance: Disheveled  Eye Contact:  Fair  Speech:  Normal Rate  Volume:  Normal   Mood:  Anxious  Affect:  Congruent  Thought Process:  Coherent and Descriptions of Associations: Intact  Orientation:  Full (Time, Place, and Person)  Thought Content:  WDL and Logical  Suicidal Thoughts:  No  Homicidal Thoughts:  No  Memory:  Immediate;   Fair Recent;   Fair Remote;   Fair  Judgement:  Fair  Insight:  Fair  Psychomotor Activity:  Normal  Concentration:  Concentration: Fair and Attention Span: Fair  Recall:  [employer] of Knowledge:  Fair  Language:  Good  Akathisia:  No  Handed:  Right  AIMS (if indicated):     Assets:  Housing Leisure Time Physical Health Resilience Social Support  ADL's:  Intact  Cognition:  WNL  Sleep:        Treatment Plan Summary: Methamphetamine and benzodiazepine abuse: -Refrain from alcohol and drug use -Follow up with 12-step program and a sponsor -F/up with ADS and outpatient resources  Disposition: No evidence of imminent risk to self or others at present.   Patient does not meet criteria for psychiatric inpatient admission.  Waylan Boga, NP 01/31/2020 3:51 PM

## 2020-01-31 NOTE — ED Provider Notes (Signed)
[facility] Emergency Department Provider Note ____________________________________________   First MD Initiated Contact with Patient 01/31/20 [phone]     (approximate)  I have reviewed the triage vital signs and the nursing notes.   HISTORY  Chief Complaint Altered Mental Status  EM caveat: Agitation  HPI The patient reports her name to be "Leslie Molina"  Nothing is known about her per se. The patient is able to tell me his lives on [address] in [place]. She tells me she is upset and relapsed. She did methamphetamine as well as Xanax in the last day. Police were reportedly called out as she was running about agitated. She required police restraint with handcuffs, EMS was called found her to be quite tachycardic.  Patient herself cannot tell me if she any recent illness, she does tell me her relates that family member died in a car accident not long ago. She relapsed on "meth" as well as "Xanax"  No past medical history on file.  Patient Active Problem List   Diagnosis Date Noted  . AMS (altered mental status) 01/31/2020      Prior to Admission medications   Not on File    Allergies Patient has no allergy information on record.  No family history on file.  Social History Social History   Tobacco Use  . Smoking status: Not on file  Substance Use Topics  . Alcohol use: Not on file  . Drug use: Not on file    Review of Systems  EM caveat   ____________________________________________   PHYSICAL EXAM:  VITAL SIGNS: ED Triage Vitals  Enc Vitals Group     BP      Pulse      Resp      Temp      Temp src      SpO2      Weight      Height      Head Circumference      Peak Flow      Pain Score      Pain Loc      Pain Edu?      Excl. in [place]?     Constitutional: Alert and oriented to self, but not well to situation.  Very excited.  Agitated Eyes: Conjunctivae are normal.  Pupils dilated slightly bilaterally. Head:  Atraumatic. Nose: No congestion/rhinnorhea. Mouth/Throat: Mucous membranes are dry. Neck: No stridor.  Cardiovascular: Moderately tachycardic rate, regular rhythm. Grossly normal heart sounds.  Good peripheral circulation. Respiratory: Normal respiratory effort.  No retractions. Lungs CTAB. Gastrointestinal: Soft and nontender. No distention.  Clean dry intact old incision sites from cholecystectomy Musculoskeletal: No lower extremity tenderness nor edema.  No tremor. Neurologic:  Normal speech and language. No gross focal neurologic deficits are appreciated.  Skin:  Skin is warm, diaphoretic and intact. No rash noted. Psychiatric: Mood and affect are agitated, oriented to self.  Somewhat combative.  ____________________________________________   LABS (all labs ordered are listed, but only abnormal results are displayed)  Labs Reviewed  COMPREHENSIVE METABOLIC PANEL - Abnormal; Notable for the following components:      Result Value   Potassium 2.8 (*)    CO2 20 (*)    Glucose, Bld 124 (*)    Creatinine, Ser 1.23 (*)    Total Protein 9.6 (*)    Albumin 5.3 (*)    Alkaline Phosphatase 172 (*)    Total Bilirubin 1.3 (*)    GFR calc non Af Amer 57 (*)  Anion gap 16 (*)    All other components within normal limits  CBC WITH DIFFERENTIAL/PLATELET - Abnormal; Notable for the following components:   WBC 13.0 (*)    RBC 5.69 (*)    Hemoglobin 15.1 (*)    MCV 76.1 (*)    RDW 16.3 (*)    Neutro Abs 11.2 (*)    All other components within normal limits  SALICYLATE LEVEL - Abnormal; Notable for the following components:   Salicylate Lvl <4.5 (*)    All other components within normal limits  ACETAMINOPHEN LEVEL - Abnormal; Notable for the following components:   Acetaminophen (Tylenol), Serum <10 (*)    All other components within normal limits  CK - Abnormal; Notable for the following components:   Total CK 604 (*)    All other components within normal limits  LACTIC ACID,  PLASMA - Abnormal; Notable for the following components:   Lactic Acid, Venous 2.3 (*)    All other components within normal limits  SARS CORONAVIRUS 2 BY RT PCR (HOSPITAL ORDER, [place] [facility])  CULTURE, BLOOD (ROUTINE X 2)  CULTURE, BLOOD (ROUTINE X 2)  ETHANOL  HCG, QUANTITATIVE, PREGNANCY  PROTIME-INR  URINE DRUG SCREEN, QUALITATIVE ([facility] ONLY)  LACTIC ACID, PLASMA  URINALYSIS, COMPLETE (UACMP) WITH MICROSCOPIC  PROCALCITONIN   ____________________________________________  EKG   ____________________________________________  RADIOLOGY  DG Chest Port 1 View  Result Date: 01/31/2020 CLINICAL DATA:  Questionable sepsis EXAM: PORTABLE CHEST 1 VIEW COMPARISON:  08/09/2019 FINDINGS: Low volume AP portable examination with subtle bibasilar heterogeneous and interstitial airspace opacity. The heart and mediastinum are unremarkable. The bones and upper abdomen are unremarkable. IMPRESSION: Low volume AP portable examination with subtle bibasilar heterogeneous and interstitial airspace opacity, concerning for infection, although this appearance may be exaggerated by low volume technique and partial atelectasis. Consider PA and lateral radiographs to further evaluate. Electronically Signed   By: Eddie Candle M.D.   On: 01/31/2020 10:39     2 view chest x-ray ordered after discussing with hospitalist, hospitalist to follow-up ____________________________________________   PROCEDURES  Procedure(s) performed: None  Procedures  Critical Care performed: Yes, see critical care note(s)  CRITICAL CARE Performed by: Delman Kitten   Total critical care time: 35 minutes  Critical care time was exclusive of separately billable procedures and treating other patients.  Critical care was necessary to treat or prevent imminent or life-threatening deterioration.  Critical care was time spent personally by me on the following activities: development of treatment plan with  patient and/or surrogate as well as nursing, discussions with consultants, evaluation of patient's response to treatment, examination of patient, obtaining history from patient or surrogate, ordering and performing treatments and interventions, ordering and review of laboratory studies, ordering and review of radiographic studies, pulse oximetry and re-evaluation of patient's condition.   Patient with significant agitation, improvement when treated with Versed intramuscular twice.  Patient also noted to be febrile, source unclear as of 10:45 AM in the ER.  Covering with vancomycin given previous history of MRSA as we continue her work-up, awaiting urinalysis, cultures have been sent, repeat chest x-ray, Covid all pending at this time.  Drug-induced fever also possibility.  Awaiting urinalysis drug screen etc.  Case discussed with the hospitalist.  Will admit for further work-up including rule out infectious source.  Patient is much more alert at the time of admission decision, I checked with her at 10:30 AM and she is calm, no headaches, no neck pain or stiffness.  No evidence that would suggest benefit to lumbar puncture in order to evaluate for encephalitis meningitis at this time. ____________________________________________   INITIAL IMPRESSION / ASSESSMENT AND PLAN / ED COURSE  Pertinent labs & imaging results that were available during my care of the patient were reviewed by me and considered in my medical decision making (see chart for details).     Clinical Course as of Jan 31 1127  Wed Jan 31, 2020  0857 Patient beginning to calm, but still somewhat agitated.  She is having difficulty staying in the treatment room, rolling about.  Continues to be somewhat combative.  She is now out of handcuffs which the police have removed.  She is respirating comfortably.  Noted to be febrile on vital signs.  Patient does relate a history of remote MRSA infection as well.  Plan to evaluate with labs to  check for signs or symptoms of possible sepsis.  Differential diagnosis remains quite broad at this time.  Give additional Versed at this time for agitation and calming.  Patient placed under IVC for her safety given her level of agitation, polysubstance abuse, and disorientation or inability provide history   [MQ]  0910 Temperature rechecked, noted afebrile.   [MQ]  [phone] Patient sepsis reassessment. mental status has improved.  She is now alert, oriented.  Does relate a history of MRSA bone infection in March.  She has not noted any fevers at home but has also been injecting as well as using various drugs which she believes were methamphetamine and Xanax but relates that could be "homemade".  She does have confirmed fever, elevated white count.  Given previous history will start on vancomycin.  Obtain blood cultures.  Discussed with the patient she is understanding with plan for admission as we rule out sepsis.  Strong radial pulse.  Normal capillary refill.  [MQ]  [id number] Clinical examination improving.  Heart rate improving.  Heart rate 120 now.  Strong right radial pulse.   [MQ]    Clinical Course User Index [MQ] Delman Kitten, MD    Admission discussed with Dr. Francine Graven who will see for admission.  ----------------------------------------- 11:24 AM on 01/31/2020 -----------------------------------------  Dr. Francine Graven informed me she is seen the patient, she is going to order a CT scan chest abdomen pelvis as the patient complaining of a lumbar sacral discomfort.  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  Hospitalist will order and follow-up on.  As this patient's condition seems to improve and her delirium seems to be clearing, she does appear to have ongoing medical concerns including report of the left flank pain.  Awaiting urinalysis at this time as well. ____________________________________________   FINAL CLINICAL IMPRESSION(S) / ED DIAGNOSES  Final  diagnoses:  Altered mental status, unspecified altered mental status type  SIRS (systemic inflammatory response syndrome) ([place])  Involuntary commitment        Note:  This document was prepared using Dragon voice recognition software and may include unintentional dictation errors       Delman Kitten, MD 01/31/20 1129

## 2020-01-31 NOTE — H&P (Signed)
History and Physical    Leslie Molina XTG:[id number] DOB: [birth year] DOA: 01/31/2020  PCP: Patient, No Pcp Per   Patient coming from: Home  I have personally briefly reviewed patient's old medical records in [place]  Chief Complaint: Altered mental status  HPI: Leslie Molina is a 34 y.o. female with medical history significant for bipolar disorder, anxiety, history of IV drug (patient admits to recent amphetamine use) as well as Xanax, history of alcohol and nicotine dependence who was brought into the ER by EMS because patient was found running around the parking lot banging her head on things and appeared to be a danger to herself.  She was noted to have abrasions all over her extremities and face and is very fidgety.  IVC papers were taken out in the emergency room. Patient was noted to be febrile in the ER with a temp of 100.15F, she was tachycardic with heart rate in the 140s and had leukocytosis with a left shift.  She received 8 mg of Versed in the emergency room and despite that is awake and alert and very fidgety.  She had a chest x-ray which was reviewed by me and did not show any acute findings. She complains of generalized aches and pains as well as bumps on her scalp from where she hit her head on the ground.  She also complains of pain in the left lumbar area but denies having any urinary symptoms.  She has a dry cough.  She also admits to recent Covid exposure. 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 Patient had needle aspiration and drain placement. Blood and pelvic abscess grew MRSA.  CT of the neck on 3/7/2021showed multiple peripheral predominant nodules at the lung apices suspicious for septic emboli and a right empyemawith inflammation and stranding on the right retroclavicular region and right lower  neck consistent with soft tissue infection. IR performed US-guided thoracentesis and aspirated 14 mL of purulent material. Pleural culture grew MRSA. Patient was treated with IV vancomycin and the plan was for her to be discharged home on doxycycline to complete a 2-week course but she signed out of the hospital [place]. Labs reveal potassium of 2.8, BUN of 19, creatinine 1.23, sodium 139, total CK 604, lactic acid 2.3, white count 13, hemoglobin 15, hematocrit 43, platelet count 352. Urine analysis appears clean. Her SARS coronavirus test is negative    ED Course: Patient is a 34 year old female with a history of IV drug use who was brought into the ER by EMS for evaluation.  She was found agitated in the field and altered.  Patient was found to have a fever with a T-max of 100.15F, she was tachycardic and has a white cell count of 13,000 with a left shift.  Lactic acid is also elevated at 2.3.  She will be admitted to the hospital for further evaluation  Review of Systems: As per HPI otherwise 10 point review of systems negative.    Past Medical History:  Diagnosis Date  . Anxiety   . Bipolar disorder ([place])       reports that she has been smoking cigarettes. She has been smoking about 1.00 pack per day. She does not have any smokeless tobacco history on file. She reports current alcohol use. She reports current drug use. Drug: Methamphetamines.  Not on File  Family History  Problem Relation Age of Onset  . Hypertension Mother   . Hypertension Father      Prior to Admission medications   Not on File    Physical Exam: Vitals:   01/31/20 0829 01/31/20 0853 01/31/20 0930  BP: (!) 181/76    Pulse: (!) 145    Resp: (!) 24    Temp: (!) 102 F (38.9 C) 97.8 F (36.6 C) (!) 100.8 F (38.2 C)  TempSrc: Axillary Axillary Oral  SpO2: 100%    Weight:   99.8 kg  Height:   5\' 6"  (1.676 m)     Vitals:   01/31/20 0829 01/31/20 0853 01/31/20 0930  BP: (!) 181/76     Pulse: (!) 145    Resp: (!) 24    Temp: (!) 102 F (38.9 C) 97.8 F (36.6 C) (!) 100.8 F (38.2 C)  TempSrc: Axillary Axillary Oral  SpO2: 100%    Weight:   99.8 kg  Height:   5\' 6"  (1.676 m)    Constitutional: NAD, alert and oriented x 3.  Very anxious and fidgeting.  Looks older than stated age.  Erythematous rash over her face and extremities Eyes: PERRL, lids and conjunctivae pale ENMT: Mucous membranes are dry Neck: normal, supple, no masses, no thyromegaly Respiratory: Bilateral air entry, no wheezing, no crackles. Normal respiratory effort. No accessory muscle use.  Cardiovascular: Tachycardic, no murmurs / rubs / gallops. No extremity edema. 2+ pedal pulses. No carotid bruits.  Abdomen: no tenderness, no masses palpated. No hepatosplenomegaly. Bowel sounds positive.  Musculoskeletal: no clubbing / cyanosis. No joint deformity upper and lower extremities.  Skin: no rashes, lesions, ulcers.  Neurologic: No gross focal neurologic deficit. Psychiatric: Normal mood and affect.   Labs on Admission: I have personally reviewed following labs and imaging studies  CBC: Recent Labs  Lab 01/31/20 0918  WBC 13.0*  NEUTROABS 11.2*  HGB 15.1*  HCT 43.3  MCV 76.1*  PLT 176   Basic Metabolic Panel: Recent Labs  Lab 01/31/20 0918  NA 139  K 2.8*  CL 103  CO2 20*  GLUCOSE 124*  BUN 19  CREATININE 1.23*  CALCIUM 9.6   GFR: Estimated Creatinine Clearance: 76.1 mL/min (A) (by C-G formula based on SCr of 1.23 mg/dL (H)). Liver Function Tests: Recent Labs  Lab 01/31/20 0918  AST 35  ALT 28  ALKPHOS 172*  BILITOT 1.3*  PROT 9.6*  ALBUMIN 5.3*   No results for input(s): LIPASE, AMYLASE in the last 168 hours. No results for input(s): AMMONIA in the last 168 hours. Coagulation Profile: Recent Labs  Lab 01/31/20 0918  INR 1.2   Cardiac Enzymes: Recent Labs  Lab 01/31/20 0918  CKTOTAL 604*   BNP (last 3 results) No results for input(s): PROBNP in the last  8760 hours. HbA1C: No results for input(s): HGBA1C in the last 72 hours. CBG: No results for input(s): GLUCAP in the last 168 hours. Lipid Profile: No results for input(s): CHOL, HDL, LDLCALC, TRIG, CHOLHDL, LDLDIRECT in the last 72 hours. Thyroid Function Tests: No results for input(s): TSH, T4TOTAL, FREET4, T3FREE, THYROIDAB in the last 72 hours. Anemia Panel: No results for input(s): VITAMINB12, FOLATE, FERRITIN, TIBC, IRON, RETICCTPCT in the last 72 hours. Urine analysis:    Component Value Date/Time   COLORURINE AMBER (A) 01/31/2020 1107   APPEARANCEUR HAZY (A) 01/31/2020 1107   LABSPEC 1.031 (H) 01/31/2020 1107   PHURINE 5.0 01/31/2020 1107   GLUCOSEU NEGATIVE 01/31/2020 1107   HGBUR  NEGATIVE 01/31/2020 1107   BILIRUBINUR NEGATIVE 01/31/2020 1107   KETONESUR 20 (A) 01/31/2020 1107   PROTEINUR 100 (A) 01/31/2020 1107   NITRITE NEGATIVE 01/31/2020 1107   LEUKOCYTESUR NEGATIVE 01/31/2020 1107    Radiological Exams on Admission: DG Chest 2 View  Result Date: 01/31/2020 CLINICAL DATA:  Evaluate source of fevers. EXAM: CHEST - 2 VIEW COMPARISON:  Earlier today FINDINGS: The heart size and mediastinal contours are within normal limits. Both lungs are clear. The visualized skeletal structures are unremarkable. IMPRESSION: No active cardiopulmonary disease. Electronically Signed   By: Kerby Moors M.D.   On: 01/31/2020 11:36   DG Chest Port 1 View  Result Date: 01/31/2020 CLINICAL DATA:  Questionable sepsis EXAM: PORTABLE CHEST 1 VIEW COMPARISON:  08/09/2019 FINDINGS: Low volume AP portable examination with subtle bibasilar heterogeneous and interstitial airspace opacity. The heart and mediastinum are unremarkable. The bones and upper abdomen are unremarkable. IMPRESSION: Low volume AP portable examination with subtle bibasilar heterogeneous and interstitial airspace opacity, concerning for infection, although this appearance may be exaggerated by low volume technique and partial  atelectasis. Consider PA and lateral radiographs to further evaluate. Electronically Signed   By: Eddie Candle M.D.   On: 01/31/2020 10:39    EKG: Independently reviewed.  Sinus tachycardia  Assessment/Plan Principal Problem:   Sepsis ([place]) Active Problems:   AMS (altered mental status)   SIRS (systemic inflammatory response syndrome) (HCC)   Rhabdomyolysis   Hypokalemia   Substance abuse (H[facility])    Sepsis ([facility]) As evidenced by fever with a T-max of 102F, tachycardia, white count of 13,000, lactic acid of 2.4 and an unclear source at this time Patient has a history of IV drug use and was recently treated for MRSA septicemia Follow-up results of blood cultures We will treat patient empirically with IV vancomycin Aggressive IV fluid resuscitation    Altered mental status Most likely secondary to polysubstance abuse Patient was agitated in the field and received multiple doses of Versed in the ER She is more awake and alert We will continue as needed benzodiazepine for agitation    Rhabdomyolysis Traumatic Aggressive IV fluid hydration Repeat total CK levels in a.m.   Hypokalemia Supplement potassium Obtain magnesium levels   Nicotine dependence Smoking cessation was discussed with patient in detail We will place patient on nicotine transdermal patch 21 mg daily   History of alcohol dependence Monitor patient closely for signs and symptoms of alcohol withdrawal We will place patient on lorazepam and administer for CIWA score of 8 or greater        DVT prophylaxis: Lovenox Code Status: Full Family Communication: Greater than 50% of time was spent discussing patient's condition and plan of care with her at the bedside. Disposition Plan: Back to previous home environment Consults called: Psychiatry    Cerra Eisenhower MD [facility]     01/31/2020, 11:54 AM

## 2020-01-31 NOTE — ED Notes (Signed)
IVC PENDING  CONSULT ?

## 2020-01-31 NOTE — Progress Notes (Signed)
CODE SEPSIS - PHARMACY COMMUNICATION  **Broad Spectrum Antibiotics should be administered within 1 hour of Sepsis diagnosis**  Time Code Sepsis Called/Page Received: 1201  Antibiotics Ordered: vancomycin/cefepime/metronidazole  Time of 1st antibiotic administration: 1211  Additional action taken by pharmacy: n/a  Benita Gutter  01/31/2020  12:26 PM

## 2020-01-31 NOTE — ED Notes (Signed)
Pt called out, when arrived to the room, pt was standing at the end of the bed, IV was pulled out and IV infusion running to the floor. Pt sat back on to the bed and blood cleaned up from pt, bed and floor. Clean linen applied to the bed , pt changed into a new gown. Pt asking how long they think she needed to stay , states she needs to leave. Pt encouraged to stay at this time.

## 2020-02-01 ENCOUNTER — Inpatient Hospital Stay
Admit: 2020-02-01 | Discharge: 2020-02-01 | Disposition: A | Discharge status: Home / Self Care | Payer: Medicaid Other | Attending: Internal Medicine | Admitting: Internal Medicine

## 2020-02-01 ENCOUNTER — Encounter: Payer: Self-pay | Admitting: Internal Medicine

## 2020-02-01 DIAGNOSIS — F1910 Other psychoactive substance abuse, uncomplicated: Secondary | ICD-10-CM

## 2020-02-01 DIAGNOSIS — T796XXD Traumatic ischemia of muscle, subsequent encounter: Secondary | ICD-10-CM

## 2020-02-01 LAB — BLOOD CULTURE ID PANEL (REFLEXED) - BCID2

## 2020-02-01 LAB — ECHOCARDIOGRAM COMPLETE
AR max vel: 3.21 cm2
AV Area VTI: 4.38 cm2
AV Area mean vel: 3.33 cm2
AV Mean grad: 3.5 mmHg
AV Peak grad: 6.3 mmHg
Ao pk vel: 1.26 m/s
Area-P 1/2: 5.75 cm2
Height: 66 in
S' Lateral: 2.23 cm
Weight: 3520 oz

## 2020-02-01 LAB — BASIC METABOLIC PANEL
Anion gap: 4 — ABNORMAL LOW (ref 5–15)
BUN: 8 mg/dL (ref 6–20)
CO2: 23 mmol/L (ref 22–32)
Calcium: 8.2 mg/dL — ABNORMAL LOW (ref 8.9–10.3)
Chloride: 108 mmol/L (ref 98–111)
Creatinine, Ser: 0.45 mg/dL (ref 0.44–1.00)
GFR calc Af Amer: >60 mL/min (ref >60)
GFR calc non Af Amer: >60 mL/min (ref >60)
Glucose, Bld: 120 mg/dL — ABNORMAL HIGH (ref 70–99)
Potassium: 4 mmol/L (ref 3.5–5.1)
Sodium: 135 mmol/L (ref 135–145)

## 2020-02-01 LAB — PROTIME-INR
INR: 1.1 (ref 0.8–1.2)
Prothrombin Time: 14.1 seconds (ref 11.4–15.2)

## 2020-02-01 LAB — CBC
HCT: 35.8 % — ABNORMAL LOW (ref 36.0–46.0)
Hemoglobin: 12.2 g/dL (ref 12.0–15.0)
MCH: 27.4 pg (ref 26.0–34.0)
MCHC: 34.1 g/dL (ref 30.0–36.0)
MCV: 80.4 fL (ref 80.0–100.0)
Platelets: 201 10*3/uL (ref 150–400)
RBC: 4.45 MIL/uL (ref 3.87–5.11)
RDW: 16.4 % — ABNORMAL HIGH (ref 11.5–15.5)
WBC: 4.8 10*3/uL (ref 4.0–10.5)
nRBC: 0 % (ref 0.0–0.2)

## 2020-02-01 LAB — HIV ANTIBODY (ROUTINE TESTING W REFLEX): HIV Screen 4th Generation wRfx: NONREACTIVE

## 2020-02-01 LAB — CORTISOL-AM, BLOOD: Cortisol - AM: 9.5 ug/dL (ref 6.7–22.6)

## 2020-02-01 LAB — CK: Total CK: 464 U/L — ABNORMAL HIGH (ref 38–234)

## 2020-02-01 LAB — PROCALCITONIN: Procalcitonin: <0.1 ng/mL

## 2020-02-01 MED ORDER — LORAZEPAM 1 MG PO TABS
1.0000 mg | ORAL_TABLET | Freq: Three times a day (TID) | ORAL | Status: DC | PRN
  Administered 2020-02-02 – 2020-02-03 (×4): 1 mg via ORAL
  Filled 2020-02-01 (×5): qty 1

## 2020-02-01 MED ORDER — BUPRENORPHINE HCL-NALOXONE HCL 8-2 MG SL FILM
1.0000 | ORAL_FILM | Freq: Three times a day (TID) | SUBLINGUAL | Status: DC
Start: 2020-02-01 — End: 2020-02-01

## 2020-02-01 MED ORDER — VANCOMYCIN HCL IN DEXTROSE 750-5 MG/150ML-% IV SOLN
750.0000 mg | Freq: Three times a day (TID) | INTRAVENOUS | Status: DC
  Administered 2020-02-01 – 2020-02-02 (×3): 750 mg via INTRAVENOUS
  Filled 2020-02-01 (×9): qty 150

## 2020-02-01 MED ORDER — BUPRENORPHINE HCL-NALOXONE HCL 8-2 MG SL FILM
1.0000 | ORAL_FILM | Freq: Two times a day (BID) | SUBLINGUAL | Status: DC
Start: 2020-02-01 — End: 2020-02-02

## 2020-02-01 NOTE — ED Notes (Addendum)
Pt's belongings bagged and labeled: one shirt, one pair of pj shorts, one bra, one tank top

## 2020-02-01 NOTE — ED Notes (Signed)
Per charge nurse, pt has assigned bed but accepting floor does not have a sitter so pt will remain in ER at present.

## 2020-02-01 NOTE — ED Notes (Signed)
Pt given diet, pt tolerating well.

## 2020-02-01 NOTE — ED Notes (Signed)
Pt getting more fidgety, pacing in room, playing with cords. Pt states she feels anxious. Pt given 1 mg po ativan, See MAR

## 2020-02-01 NOTE — Progress Notes (Signed)
*  PRELIMINARY RESULTS* Echocardiogram 2D Echocardiogram has been performed.  Leslie Molina 02/01/2020, 11:34 AM

## 2020-02-01 NOTE — Progress Notes (Signed)
Progress Note    Leslie Molina  GUR:[id number] DOB: February 21, 1986  DOA: 01/31/2020 PCP: Patient, No Pcp Per      Brief Narrative:    Medical records reviewed and are as summarized below:  Leslie Molina is a 34 y.o. female       Assessment/Plan:   Principal Problem:   Sepsis ([place]) Active Problems:   AMS (altered mental status)   SIRS (systemic inflammatory response syndrome) (HCC)   Rhabdomyolysis   Hypokalemia   Substance abuse (HCC)   Stimulant abuse (HCC)   Benzodiazepine abuse ([place])   Body mass index is 35.51 kg/m.  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 Ola Spurr to assist with management Discontinue IV fluids Patient has been evaluated by the psychiatrist and there is no need for inpatient psych admission at this time   Diet Order            Diet regular Room service appropriate? Yes; Fluid consistency: Thin  Diet effective now                    Consultants:  Psychiatry  Infectious disease  Procedures:  None    Medications:   . enoxaparin (LOVENOX) injection  40 mg Subcutaneous Q24H  . folic acid  1 mg Oral Daily  . gabapentin  300 mg Oral TID  . midazolam  4 mg Intramuscular Once  . multivitamin with minerals  1 tablet Oral Daily  . nicotine  21 mg Transdermal Daily  . thiamine  100 mg Oral Daily   Or  . thiamine  100 mg Intravenous Daily   Continuous Infusions: . ceFEPime (MAXIPIME) IV Stopped (02/01/20 0533)  . metronidazole Stopped (02/01/20 0531)  . vancomycin Stopped (02/01/20 1344)     Anti-infectives (From admission, onward)   Start     Dose/Rate Route Frequency Ordered Stop   02/01/20 1000  vancomycin (VANCOCIN) IVPB 750  mg/150 ml premix        750 mg 150 mL/hr over 60 Minutes Intravenous Every 8 hours 02/01/20 0927     01/31/20 2200  vancomycin (VANCOCIN) IVPB 750 mg/150 ml premix  Status:  Discontinued        750 mg 150 mL/hr over 60 Minutes Intravenous Every 12 hours 01/31/20 1239 02/01/20 0927   01/31/20 2200  ceFEPIme (MAXIPIME) 2 g in sodium chloride 0.9 % 100 mL IVPB        2 g 200 mL/hr over 30 Minutes Intravenous Every 8 hours 01/31/20 1245     01/31/20 1300  ceFEPIme (MAXIPIME) 2 g in sodium chloride 0.9 % 100 mL IVPB        2 g 200 mL/hr over 30 Minutes Intravenous  Once 01/31/20 1206 01/31/20 1536   01/31/20 1300  metroNIDAZOLE (FLAGYL) IVPB 500 mg        500 mg 100 mL/hr over 60 Minutes Intravenous Every 8 hours 01/31/20 1206     01/31/20 1300  vancomycin (VANCOCIN) IVPB 1000 mg/200 mL premix        1,000 mg 200 mL/hr over 60 Minutes Intravenous  Once 01/31/20 1206 01/31/20 1639   01/31/20 1300  vancomycin (VANCOCIN) IVPB 1000 mg/200 mL premix        1,000 mg 200 mL/hr over 60 Minutes Intravenous  Once 01/31/20 1235 01/31/20 1840   01/31/20 1000  vancomycin (VANCOCIN) IVPB 1000 mg/200 mL premix  Status:  Discontinued        1,000 mg 200 mL/hr over 60 Minutes Intravenous  Once 01/31/20 4536 01/31/20 1209             Family Communication/Anticipated D/C date and plan/Code Status   DVT prophylaxis: enoxaparin (LOVENOX) injection 40 mg Start: 01/31/20 1300     Code Status: Full Code  Family Communication: Plan discussed with patient Disposition Plan:    Status is: Inpatient  Remains inpatient appropriate because:IV treatments appropriate due to intensity of illness or inability to take PO and Inpatient level of care appropriate due to severity of illness   Dispo: The patient is from: Home              Anticipated d/c is to: Home              Anticipated d/c date is: 2 days              Patient currently is not medically stable to d/c.           Subjective:    c/o body aches, headache.  No vomiting, diarrhea or abdominal pain.  No shortness of breath or chest pain.  Vicente Males, RN at bedside  Objective:    Vitals:   02/01/20 1000 02/01/20 1137 02/01/20 1300 02/01/20 1330  BP:  (!) 157/97 105/74 113/73  Pulse: (!) 116 (!) 118 (!) 108 (!) 103  Resp:   14 19  Temp:      TempSrc:      SpO2: 100% 98% 100% 100%  Weight:      Height:       No data found.   Intake/Output Summary (Last 24 hours) at 02/01/2020 1358 Last data filed at 02/01/2020 1344 Gross per 24 hour  Intake 250 ml  Output --  Net 250 ml   Filed Weights   01/31/20 0930  Weight: 99.8 kg    Exam:  GEN: NAD SKIN: bruises on face and upper extremities EYES: EOMI ENT: MMM CV: Tachycardic, no murmurs heard PULM: CTA B ABD: soft, ND, NT, +BS CNS: AAO x 3, non focal EXT: No edema or tenderness   Data Reviewed:   I have personally reviewed following labs and imaging studies:  Labs: Labs show the following:   Basic Metabolic Panel: Recent Labs  Lab 01/31/20 0918 01/31/20 1708 02/01/20 0411  NA 139  --  135  K 2.8*  --  4.0  CL 103  --  108  CO2 20*  --  23  GLUCOSE 124*  --  120*  BUN 19  --  8  CREATININE 1.23*  --  0.45  CALCIUM 9.6  --  8.2*  MG  --  2.0  --   PHOS  --  2.6  --    GFR Estimated Creatinine Clearance: 117 mL/min (by C-G formula based on SCr of 0.45 mg/dL). Liver Function Tests: Recent Labs  Lab 01/31/20 0918  AST 35  ALT 28  ALKPHOS 172*  BILITOT 1.3*  PROT 9.6*  ALBUMIN 5.3*   No results for input(s): LIPASE, AMYLASE in the last 168 hours. No results for input(s): AMMONIA in the last 168 hours. Coagulation profile Recent Labs  Lab 01/31/20 0918 02/01/20 0411  INR 1.2 1.1    CBC: Recent Labs  Lab 01/31/20 0918 02/01/20 0411  WBC 13.0* 4.8  NEUTROABS 11.2*  --  HGB 15.1* 12.2  HCT 43.3 35.8*  MCV 76.1* 80.4  PLT 352 201   Cardiac Enzymes: Recent Labs  Lab 01/31/20 0918 02/01/20 0411  CKTOTAL 604* 464*    BNP (last 3 results) No results for input(s): PROBNP in the last 8760 hours. CBG: No results for input(s): GLUCAP in the last 168 hours. D-Dimer: No results for input(s): DDIMER in the last 72 hours. Hgb A1c: No results for input(s): HGBA1C in the last 72 hours. Lipid Profile: No results for input(s): CHOL, HDL, LDLCALC, TRIG, CHOLHDL, LDLDIRECT in the last 72 hours. Thyroid function studies: No results for input(s): TSH, T4TOTAL, T3FREE, THYROIDAB in the last 72 hours.  Invalid input(s): FREET3 Anemia work up: No results for input(s): VITAMINB12, FOLATE, FERRITIN, TIBC, IRON, RETICCTPCT in the last 72 hours. Sepsis Labs: Recent Labs  Lab 01/31/20 0918 01/31/20 1710 02/01/20 0411  PROCALCITON <0.10  --  <0.10  WBC 13.0*  --  4.8  LATICACIDVEN 2.3* 0.7  --     Microbiology Recent Results (from the past 240 hour(s))  SARS Coronavirus 2 by RT PCR (hospital order, performed in [facility] hospital lab) Nasopharyngeal Nasopharyngeal Swab     Status: None   Collection Time: 01/31/20  9:18 AM   Specimen: Nasopharyngeal Swab  Result Value Ref Range Status   SARS Coronavirus 2 NEGATIVE NEGATIVE Final    Comment: (NOTE) SARS-CoV-2 target nucleic acids are NOT DETECTED.  The SARS-CoV-2 RNA is generally detectable in upper and lower respiratory specimens during the acute phase of infection. The lowest concentration of SARS-CoV-2 viral copies this assay can detect is 250 copies / mL. A negative result does not preclude SARS-CoV-2 infection and should not be used as the sole basis for treatment or other patient management decisions.  A negative result may occur with improper specimen collection / handling, submission of specimen other than nasopharyngeal swab, presence of viral mutation(s) within the areas targeted by this assay, and inadequate number of viral copies (<250 copies / mL). A negative result must be combined with clinical observations, patient history, and  epidemiological information.  Fact Sheet for Patients:   StrictlyIdeas.no  Fact Sheet for Healthcare Providers: BankingDealers.co.za  This test is not yet approved or  cleared by the [place] FDA and has been authorized for detection and/or diagnosis of SARS-CoV-2 by FDA under an Emergency Use Authorization (EUA).  This EUA will remain in effect (meaning this test can be used) for the duration of the COVID-19 declaration under Section 564(b)(1) of the Act, 21 U.S.C. section 360bbb-3(b)(1), unless the authorization is terminated or revoked sooner.  Performed at [facility], [address] [place], [place] [address]   Blood Culture (routine x 2)     Status: None (Preliminary result)   Collection Time: 01/31/20  9:18 AM   Specimen: BLOOD  Result Value Ref Range Status   Specimen Description BLOOD RIGHT ASSIST CONTROL  Final   Special Requests   Final    BOTTLES DRAWN AEROBIC AND ANAEROBIC Blood Culture results may not be optimal due to an excessive volume of blood received in culture bottles   Culture   Final    NO GROWTH < 24 HOURS Performed at [facility], [address] [place], [place] [address]    Report Status PENDING  Incomplete  Blood Culture (routine x 2)     Status: None (Preliminary result)   Collection Time: 01/31/20  9:19 AM   Specimen: BLOOD  Result Value Ref Range Status  Specimen Description BLOOD LEFT HAND  Final   Special Requests   Final    BOTTLES DRAWN AEROBIC AND ANAEROBIC Blood Culture adequate volume   Culture  Setup Time   Final    GRAM POSITIVE COCCI ANAEROBIC BOTTLE ONLY Organism ID to follow CRITICAL RESULT CALLED TO, READ BACK BY AND VERIFIED WITH: Hart Robinsons PHARMD [id number] 02/01/20 HNM Performed at [facility], [address] [place], [place] [address]    Culture GRAM POSITIVE COCCI  Final   Report Status PENDING  Incomplete  Blood Culture ID Panel (Reflexed)      Status: Abnormal   Collection Time: 01/31/20  9:19 AM  Result Value Ref Range Status   Enterococcus faecalis NOT DETECTED NOT DETECTED Final   Enterococcus Faecium NOT DETECTED NOT DETECTED Final   Listeria monocytogenes NOT DETECTED NOT DETECTED Final   Staphylococcus species DETECTED (A) NOT DETECTED Final    Comment: CRITICAL RESULT CALLED TO, READ BACK BY AND VERIFIED WITH: SCOTT HALL PHARMD [id number] 02/01/20 HNM    Staphylococcus aureus (BCID) NOT DETECTED NOT DETECTED Final   Staphylococcus epidermidis DETECTED (A) NOT DETECTED Final    Comment: CRITICAL RESULT CALLED TO, READ BACK BY AND VERIFIED WITH: SCOTT HALL PHARMD [id number] 02/01/20 HNM    Staphylococcus lugdunensis NOT DETECTED NOT DETECTED Final   Streptococcus species NOT DETECTED NOT DETECTED Final   Streptococcus agalactiae NOT DETECTED NOT DETECTED Final   Streptococcus pneumoniae NOT DETECTED NOT DETECTED Final   Streptococcus pyogenes NOT DETECTED NOT DETECTED Final   A.calcoaceticus-baumannii NOT DETECTED NOT DETECTED Final   Bacteroides fragilis NOT DETECTED NOT DETECTED Final   Enterobacterales NOT DETECTED NOT DETECTED Final   Enterobacter cloacae complex NOT DETECTED NOT DETECTED Final   Escherichia coli NOT DETECTED NOT DETECTED Final   Klebsiella aerogenes NOT DETECTED NOT DETECTED Final   Klebsiella oxytoca NOT DETECTED NOT DETECTED Final   Klebsiella pneumoniae NOT DETECTED NOT DETECTED Final   Proteus species NOT DETECTED NOT DETECTED Final   Salmonella species NOT DETECTED NOT DETECTED Final   Serratia marcescens NOT DETECTED NOT DETECTED Final   Haemophilus influenzae NOT DETECTED NOT DETECTED Final   Neisseria meningitidis NOT DETECTED NOT DETECTED Final   Pseudomonas aeruginosa NOT DETECTED NOT DETECTED Final   Stenotrophomonas maltophilia NOT DETECTED NOT DETECTED Final   Candida albicans NOT DETECTED NOT DETECTED Final   Candida auris NOT DETECTED NOT DETECTED Final   Candida glabrata NOT DETECTED NOT  DETECTED Final   Candida krusei NOT DETECTED NOT DETECTED Final   Candida parapsilosis NOT DETECTED NOT DETECTED Final   Candida tropicalis NOT DETECTED NOT DETECTED Final   Cryptococcus neoformans/gattii NOT DETECTED NOT DETECTED Final   Methicillin resistance mecA/C NOT DETECTED NOT DETECTED Final    Comment: Performed at [facility], [address] [place], [place] [address]    Procedures and diagnostic studies:  DG Chest 2 View  Result Date: 01/31/2020 CLINICAL DATA:  Evaluate source of fevers. EXAM: CHEST - 2 VIEW COMPARISON:  Earlier today FINDINGS: The heart size and mediastinal contours are within normal limits. Both lungs are clear. The visualized skeletal structures are unremarkable. IMPRESSION: No active cardiopulmonary disease. Electronically Signed   By: Kerby Moors M.D.   On: 01/31/2020 11:36   CT ABDOMEN PELVIS W CONTRAST  Result Date: 01/31/2020 CLINICAL DATA:  LEFT lower quadrant pain, relapsed on methamphetamine EXAM: CT ABDOMEN AND PELVIS WITH CONTRAST TECHNIQUE: Multidetector CT imaging of the abdomen and pelvis was performed using the standard protocol  following bolus administration of intravenous contrast. Sagittal and coronal MPR images reconstructed from axial data set. CONTRAST:  167mL OMNIPAQUE IOHEXOL 350 MG/ML SOLN IV. Dilute oral contrast. COMPARISON:  12/05/2019 Correlation: CT RIGHT hip 09/02/2019 FINDINGS: Lower chest: Lung bases clear Hepatobiliary: Gallbladder surgically absent. Liver normal appearance Pancreas: Normal appearance Spleen: Normal appearance Adrenals/Urinary Tract: Adrenal glands, kidneys, ureters, and bladder normal appearance Stomach/Bowel: Normal appendix. Stomach and bowel loops normal appearance. Vascular/Lymphatic: Vascular structures patent.  No adenopathy. Reproductive: Normal size and morphology of uterus and ovaries. IUD identified, demonstrating transmural penetration of both limbs through the fundal wall of the uterus, with the  more inferior limb demonstrating slightly greater penetration than on the previous exam Other: No free air or free fluid. No hernia or inflammatory process. Thickening at umbilicus versus prior exam likely reflects interval in bili cul hernia repair. 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 IMPRESSION: No acute intra-abdominal or intrapelvic abnormalities. IUD identified, demonstrating transmural penetration of both limbs through the fundal wall of the uterus, with the more inferior limb demonstrating slightly greater penetration than on the previous exam. 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 Electronically Signed   By: Lavonia Dana M.D.   On: 01/31/2020 14:40   DG Chest Port 1 View  Result Date: 01/31/2020 CLINICAL DATA:  Questionable sepsis EXAM: PORTABLE CHEST 1 VIEW COMPARISON:  08/09/2019 FINDINGS: Low volume AP portable examination with subtle bibasilar heterogeneous and interstitial airspace opacity. The heart and mediastinum are unremarkable. The bones and upper abdomen are unremarkable. IMPRESSION: Low volume AP portable examination with subtle bibasilar heterogeneous and interstitial airspace opacity, concerning for infection, although this appearance may be exaggerated by low volume technique and partial atelectasis. Consider PA and lateral radiographs to further evaluate. Electronically Signed   By: Eddie Candle M.D.   On: 01/31/2020 10:39   ECHOCARDIOGRAM COMPLETE  Result Date: 02/01/2020    ECHOCARDIOGRAM REPORT   Patient Name:  Leslie Molina Date of      02/01/2020                           Exam: Medical Rec #: [id number]  Height:      66.0 in Accession #:   [id number] Weight:      220.0 lb Date of Birth: [birth year]  BSA:         2.082 m Patient Age:   56 years   BP:          Not listed in chart/Not  listed in chart                                        mmHg Patient        F          HR:          115 bpm. Gender: Exam Location: [facility] Procedure: 2D Echo, Cardiac Doppler and Color Doppler Indications:     Bacteremia 790.7  History:         Patient has no prior history of Echocardiogram examinations.                  Anxiety, bipolar disorder.  Sonographer:     Sherrie Sport RDCS (  AE) Referring Phys:  [place] Diagnosing Phys: Neoma Laming MD IMPRESSIONS  1. Left ventricular ejection fraction, by estimation, is 60 to 65%. The left ventricle has normal function. The left ventricle has no regional wall motion abnormalities. Left ventricular diastolic parameters were normal.  2. Right ventricular systolic function is normal. The right ventricular size is normal. There is normal pulmonary artery systolic pressure.  3. The mitral valve is normal in structure. No evidence of mitral valve regurgitation. No evidence of mitral stenosis.  4. The aortic valve is normal in structure. Aortic valve regurgitation is not visualized. No aortic stenosis is present.  5. The inferior vena cava is normal in size with greater than 50% respiratory variability, suggesting right atrial pressure of 3 mmHg. Conclusion(s)/Recommendation(s): No evidence of valvular vegetations on this transthoracic echocardiogram. Would recommend a transesophageal echocardiogram to exclude infective endocarditis if clinically indicated. FINDINGS  Left Ventricle: Left ventricular ejection fraction, by estimation, is 60 to 65%. The left ventricle has normal function. The left ventricle has no regional wall motion abnormalities. The left ventricular internal cavity size was normal in size. There is  no left ventricular hypertrophy. Left ventricular diastolic parameters were normal. Right Ventricle: The right ventricular size is normal. No increase in right ventricular wall thickness. Right ventricular systolic function is normal. There is normal  pulmonary artery systolic pressure. The tricuspid regurgitant velocity is 1.29 m/s, and  with an assumed right atrial pressure of 10 mmHg, the estimated right ventricular systolic pressure is 50.0 mmHg. Left Atrium: Left atrial size was normal in size. Right Atrium: Right atrial size was normal in size. Pericardium: There is no evidence of pericardial effusion. Mitral Valve: The mitral valve is normal in structure. Normal mobility of the mitral valve leaflets. No evidence of mitral valve regurgitation. No evidence of mitral valve stenosis. Tricuspid Valve: The tricuspid valve is normal in structure. Tricuspid valve regurgitation is not demonstrated. No evidence of tricuspid stenosis. Aortic Valve: The aortic valve is normal in structure. Aortic valve regurgitation is not visualized. No aortic stenosis is present. Aortic valve mean gradient measures 3.5 mmHg. Aortic valve peak gradient measures 6.3 mmHg. Aortic valve area, by VTI measures 4.38 cm. Pulmonic Valve: The pulmonic valve was normal in structure. Pulmonic valve regurgitation is not visualized. No evidence of pulmonic stenosis. Aorta: The aortic root is normal in size and structure. Venous: The inferior vena cava is normal in size with greater than 50% respiratory variability, suggesting right atrial pressure of 3 mmHg. IAS/Shunts: No atrial level shunt detected by color flow Doppler.  LEFT VENTRICLE PLAX 2D LVIDd:         3.55 cm  Diastology LVIDs:         2.23 cm  LV e' lateral:   16.00 cm/s LV PW:         1.03 cm  LV E/e' lateral: 6.4 LV IVS:        0.92 cm  LV e' medial:    17.60 cm/s LVOT diam:     2.20 cm  LV E/e' medial:  5.9 LV SV:         88 LV SV Index:   42 LVOT Area:     3.80 cm  RIGHT VENTRICLE RV S prime:     12.90 cm/s TAPSE (M-mode): 3.5 cm LEFT ATRIUM             Index       RIGHT ATRIUM          Index  LA diam:        3.10 cm 1.49 cm/m  RA Area:     9.57 cm LA Vol (A2C):   41.0 ml 19.69 ml/m RA Volume:   17.30 ml 8.31 ml/m LA Vol  (A4C):   38.3 ml 18.39 ml/m LA Biplane Vol: 41.2 ml 19.78 ml/m  AORTIC VALVE                   PULMONIC VALVE AV Area (Vmax):    3.21 cm    PV Vmax:        0.86 m/s AV Area (Vmean):   3.33 cm    PV Peak grad:   3.0 mmHg AV Area (VTI):     4.38 cm    RVOT Peak grad: 5 mmHg AV Vmax:           125.50 cm/s AV Vmean:          87.150 cm/s AV VTI:            0.202 m AV Peak Grad:      6.3 mmHg AV Mean Grad:      3.5 mmHg LVOT Vmax:         106.00 cm/s LVOT Vmean:        76.300 cm/s LVOT VTI:          0.232 m LVOT/AV VTI ratio: 1.15  AORTA Ao Root diam: 2.90 cm MITRAL VALVE                TRICUSPID VALVE MV Area (PHT): 5.75 cm     TR Peak grad:   6.7 mmHg MV Decel Time: 132 msec     TR Vmax:        129.00 cm/s MV E velocity: 103.00 cm/s MV A velocity: 35.40 cm/s   SHUNTS MV E/A ratio:  2.91         Systemic VTI:  0.23 m                             Systemic Diam: 2.20 cm Neoma Laming MD Electronically signed by Neoma Laming MD Signature Date/Time: 02/01/2020/12:06:14 PM    Final                LOS: 1 day   Hajime Asfaw  [facility]   Pager on [email or web address]. If 7PM-7AM, please contact night-coverage at [email or web address]     02/01/2020, 1:58 PM

## 2020-02-01 NOTE — ED Notes (Signed)
Pt denies any thoughts or intentions to harm self or others currently. Pt sitting in chair and eating from breakfast tray. Pt inquired about suboxone. Delay explained and notified this RN will update as soon as she has an answer from attending.

## 2020-02-01 NOTE — Consult Note (Signed)
Pharmacy Antibiotic Note  Leslie Molina is a 34 y.o. female with history of polysubstance abuse admitted on 01/31/2020 with altered mental status / sepsis. Patient is febrile with leukocytosis, no left shift on differential. Procalcitonin < 0.10. Per chart, patient reported remote history of MRSA bone infection in March. Pharmacy has been consulted for vancomycin and cefepime dosing.  Plan: Cefepime 2 g IV q8h  Vancomycin 2 g IV LD  Will increase maintenance regimen to vancomycin 750 mg q8h  Scr 1.23>0.45. Daily Scr per protocol.   Pharmacy will continue to follow and adjust antibiotics per renal function as indicated. Levels at steady state if prolonged duration of therapy anticipated.   Height: 5\' 6"  (167.6 cm) Weight: 99.8 kg (220 lb) IBW/kg (Calculated) : 59.3  Temp (24hrs), Avg:100.8 F (38.2 C), Min:100.8 F (38.2 C), Max:100.8 F (38.2 C)  Recent Labs  Lab 01/31/20 0918 01/31/20 1710 02/01/20 0411  WBC 13.0*  --  4.8  CREATININE 1.23*  --  0.45  LATICACIDVEN 2.3* 0.7  --     Estimated Creatinine Clearance: 117 mL/min (by C-G formula based on SCr of 0.45 mg/dL).    Not on File  Antimicrobials this admission: Metronidazole 9/1 >>  Cefepime 9/1 >>  Vancomycin 9/1 >>   Dose adjustments this admission: Increased Vancomycin 750mg  q12 to 750mg  q8h based on improved renal function  Microbiology results: 9/1 BCx: pending  Thank you for allowing pharmacy to be a part of this patient's care.  Lu Duffel, PharmD, BCPS Clinical Pharmacist 02/01/2020 9:28 AM

## 2020-02-01 NOTE — Progress Notes (Signed)
PHARMACY - PHYSICIAN COMMUNICATION CRITICAL VALUE ALERT - BLOOD CULTURE IDENTIFICATION (BCID)  Leslie Molina is an 34 y.o. female who presented to [facility] on 01/31/2020 with a chief complaint of sepsis  Assessment:  Lab reports 1 of 4 bottles + for G[facility], staph epidermidis  Name of physician (or Provider) Contacted: Jonny Ruiz NP  Current antibiotics: Vancomycin and Cefepime  Changes to prescribed antibiotics recommended: Possible contamination but is covered by [facility], could deescalate to Ancef if warranted. Patient is on recommended antibiotics - No changes needed  Results for orders placed or performed during the hospital encounter of 01/31/20  Blood Culture ID Panel (Reflexed) (Collected: 01/31/2020  9:19 AM)  Result Value Ref Range   Enterococcus faecalis NOT DETECTED NOT DETECTED   Enterococcus Faecium NOT DETECTED NOT DETECTED   Listeria monocytogenes NOT DETECTED NOT DETECTED   Staphylococcus species DETECTED (A) NOT DETECTED   Staphylococcus aureus (BCID) NOT DETECTED NOT DETECTED   Staphylococcus epidermidis DETECTED (A) NOT DETECTED   Staphylococcus lugdunensis NOT DETECTED NOT DETECTED   Streptococcus species NOT DETECTED NOT DETECTED   Streptococcus agalactiae NOT DETECTED NOT DETECTED   Streptococcus pneumoniae NOT DETECTED NOT DETECTED   Streptococcus pyogenes NOT DETECTED NOT DETECTED   A.calcoaceticus-baumannii NOT DETECTED NOT DETECTED   Bacteroides fragilis NOT DETECTED NOT DETECTED   Enterobacterales NOT DETECTED NOT DETECTED   Enterobacter cloacae complex NOT DETECTED NOT DETECTED   Escherichia coli NOT DETECTED NOT DETECTED   Klebsiella aerogenes NOT DETECTED NOT DETECTED   Klebsiella oxytoca NOT DETECTED NOT DETECTED   Klebsiella pneumoniae NOT DETECTED NOT DETECTED   Proteus species NOT DETECTED NOT DETECTED   Salmonella species NOT DETECTED NOT DETECTED   Serratia marcescens NOT DETECTED NOT DETECTED   Haemophilus influenzae NOT DETECTED NOT DETECTED   Neisseria  meningitidis NOT DETECTED NOT DETECTED   Pseudomonas aeruginosa NOT DETECTED NOT DETECTED   Stenotrophomonas maltophilia NOT DETECTED NOT DETECTED   Candida albicans NOT DETECTED NOT DETECTED   Candida auris NOT DETECTED NOT DETECTED   Candida glabrata NOT DETECTED NOT DETECTED   Candida krusei NOT DETECTED NOT DETECTED   Candida parapsilosis NOT DETECTED NOT DETECTED   Candida tropicalis NOT DETECTED NOT DETECTED   Cryptococcus neoformans/gattii NOT DETECTED NOT DETECTED   Methicillin resistance mecA/C NOT DETECTED NOT DETECTED    Hart Robinsons A 02/01/2020  4:06 AM

## 2020-02-01 NOTE — Consult Note (Signed)
Infectious Disease     Reason for Consult:Staph epi bacteremia   Referring Physician:Dr Ayiku Date of Admission:  01/31/2020   Principal Problem:   Sepsis ([place]) Active Problems:   AMS (altered mental status)   SIRS (systemic inflammatory response syndrome) (HCC)   Rhabdomyolysis   Hypokalemia   Substance abuse ([place])   Stimulant abuse ([place])   Benzodiazepine abuse (HCC)   HPI: Leslie Molina is a 34 y.o. female  female with medical history significant forbipolar disorder,anxiety,history of IV drug (patient admits to recent amphetamine use) as well as Xanax, history of alcohol and nicotine dependence who was brought into the ER by EMS because patient was found running around the parking lot banging her head on things and appeared to be a danger to herself.  She was noted to have abrasions all over her extremities and face and is very fidgety. Patient was noted to be febrile in the ER with a temp of 100.52F, she was tachycardic with heart rate in the 140s and had leukocytosis with a left shift. She complains of generalized aches and pains as well as bumps on her scalp from where she hit her head on the ground.  She also complains of pain in the left lumbar area and hip. 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 Patient had needle aspiration and drain placement. Blood and pelvic abscess grew MRSA.  CT of the neck on 3/7/2021showed multiple peripheral predominant nodules at the lung apices suspicious for septic emboli and a right empyemawith inflammation and stranding on the right retroclavicular region and right lower neck consistent with soft tissue infection. IR performed US-guided thoracentesis and aspirated 14 mL of purulent material. Pleural culture grew MRSA. Patient was treated with IV vancomycin and the plan was for her  to be discharged home on doxycycline to complete a 2-week course but she signed out of the hospital [place].  Today she tells me she took oral abx after dc for a few weeks.     Past Medical History:  Diagnosis Date   Anxiety    Bipolar disorder ([place])     Social History   Tobacco Use   Smoking status: Current Every Day Smoker    Packs/day: 1.00    Types: Cigarettes  Substance Use Topics   Alcohol use: Yes   Drug use: Yes    Types: Methamphetamines   Family History  Problem Relation Age of Onset   Hypertension Mother    Hypertension Father     Allergies: Not on File  Current antibiotics: Antibiotics Given (last 72 hours)    Date/Time Action Medication Dose Rate   01/31/20 1211 New Bag/Given   vancomycin (VANCOCIN) IVPB 1000 mg/200 mL premix 1,000 mg 200 mL/hr   01/31/20 1427 New Bag/Given   ceFEPIme (MAXIPIME) 2 g in sodium chloride 0.9 % 100 mL IVPB 2 g 200 mL/hr   01/31/20 1538 New Bag/Given   metroNIDAZOLE (FLAGYL) IVPB 500 mg 500 mg 100 mL/hr   01/31/20 1646 New Bag/Given   vancomycin (VANCOCIN) IVPB 1000 mg/200 mL premix 1,000 mg 200 mL/hr   01/31/20 2035 New Bag/Given   metroNIDAZOLE (FLAGYL) IVPB 500 mg 500 mg 100 mL/hr   01/31/20 2157 New Bag/Given   vancomycin (VANCOCIN) IVPB 750 mg/150 ml premix 750 mg 150 mL/hr   01/31/20 2202 New Bag/Given   ceFEPIme (  MAXIPIME) 2 g in sodium chloride 0.9 % 100 mL IVPB 2 g 200 mL/hr   02/01/20 0431 New Bag/Given   metroNIDAZOLE (FLAGYL) IVPB 500 mg 500 mg 100 mL/hr   02/01/20 0503 New Bag/Given   ceFEPIme (MAXIPIME) 2 g in sodium chloride 0.9 % 100 mL IVPB 2 g 200 mL/hr   02/01/20 1051 New Bag/Given   vancomycin (VANCOCIN) IVPB 750 mg/150 ml premix 750 mg 150 mL/hr   02/01/20 1439 New Bag/Given   ceFEPIme (MAXIPIME) 2 g in sodium chloride 0.9 % 100 mL IVPB 2 g 200 mL/hr   02/01/20 1552 New Bag/Given   metroNIDAZOLE (FLAGYL) IVPB 500 mg 500 mg 100 mL/hr      MEDICATIONS:  enoxaparin  (LOVENOX) injection  40 mg Subcutaneous M01U   folic acid  1 mg Oral Daily   gabapentin  300 mg Oral TID   midazolam  4 mg Intramuscular Once   multivitamin with minerals  1 tablet Oral Daily   nicotine  21 mg Transdermal Daily   thiamine  100 mg Oral Daily   Or   thiamine  100 mg Intravenous Daily    Review of Systems - 11 systems reviewed and negative per HPI   OBJECTIVE: Temp:  [98.5 F (36.9 C)] 98.5 F (36.9 C) (09/02 1418) Pulse Rate:  [93-129] 110 (09/02 1553) Resp:  [13-26] 24 (09/02 1539) BP: (87-157)/(51-97) 102/68 (09/02 1553) SpO2:  [93 %-100 %] 100 % (09/02 1539) Physical Exam  Constitutional:  Disheveled oriented to person, place, and time. appears well-developed and well-nourished. No distress.  HENT: [place]/AT, PERRLA, no scleral icterus Mouth/Throat: Oropharynx is clear and moist. No oropharyngeal exudate.  Cardiovascular: Normal rate, regular rhythm and normal heart sounds.   Pulmonary/Chest: Effort normal and breath sounds normal. No respiratory distress.  has no wheezes.  Neck = supple, no nuchal rigidity Abdominal: Soft. Bowel sounds are normal.  exhibits no distension. There is no tenderness.  Lymphadenopathy: no cervical adenopathy. No axillary adenopathy Neurological: alert and oriented to person, place, and time.  Skin: multiple injection sites, abrasions on hands Ext mild pain with movement of her L hip Psychiatric: a normal mood and affect.  behavior is normal.    LABS: Results for orders placed or performed during the hospital encounter of 01/31/20 (from the past 48 hour(s))  SARS Coronavirus 2 by RT PCR (hospital order, performed in [facility] hospital lab) Nasopharyngeal Nasopharyngeal Swab     Status: None   Collection Time: 01/31/20  9:18 AM   Specimen: Nasopharyngeal Swab  Result Value Ref Range   SARS Coronavirus 2 NEGATIVE NEGATIVE    Comment: (NOTE) SARS-CoV-2 target nucleic acids are NOT DETECTED.  The SARS-CoV-2 RNA is  generally detectable in upper and lower respiratory specimens during the acute phase of infection. The lowest concentration of SARS-CoV-2 viral copies this assay can detect is 250 copies / mL. A negative result does not preclude SARS-CoV-2 infection and should not be used as the sole basis for treatment or other patient management decisions.  A negative result may occur with improper specimen collection / handling, submission of specimen other than nasopharyngeal swab, presence of viral mutation(s) within the areas targeted by this assay, and inadequate number of viral copies (<250 copies / mL). A negative result must be combined with clinical observations, patient history, and epidemiological information.  Fact Sheet for Patients:   StrictlyIdeas.no  Fact Sheet for Healthcare Providers: BankingDealers.co.za  This test is not yet approved or  cleared by the [place]  FDA and has been authorized for detection and/or diagnosis of SARS-CoV-2 by FDA under an Emergency Use Authorization (EUA).  This EUA will remain in effect (meaning this test can be used) for the duration of the COVID-19 declaration under Section 564(b)(1) of the Act, 21 U.S.C. section 360bbb-3(b)(1), unless the authorization is terminated or revoked sooner.  Performed at [facility], [address] [place], [place] [address]   Comprehensive metabolic panel     Status: Abnormal   Collection Time: 01/31/20  9:18 AM  Result Value Ref Range   Sodium 139 135 - 145 mmol/L   Potassium 2.8 (L) 3.5 - 5.1 mmol/L   Chloride 103 98 - 111 mmol/L   CO2 20 (L) 22 - 32 mmol/L   Glucose, Bld 124 (H) 70 - 99 mg/dL    Comment: Glucose reference range applies only to samples taken after fasting for at least 8 hours.   BUN 19 6 - 20 mg/dL   Creatinine, Ser 7.07 (H) 0.44 - 1.00 mg/dL   Calcium 9.6 8.9 - 86.7 mg/dL   Total Protein 9.6 (H) 6.5 - 8.1 g/dL   Albumin 5.3 (H)  3.5 - 5.0 g/dL   AST 35 15 - 41 U/L   ALT 28 0 - 44 U/L   Alkaline Phosphatase 172 (H) 38 - 126 U/L   Total Bilirubin 1.3 (H) 0.3 - 1.2 mg/dL   GFR calc non Af Amer 57 (L) >60 mL/min   GFR calc Af Amer >60 >60 mL/min   Anion gap 16 (H) 5 - 15    Comment: Performed at [facility], [address] [place], [place] [address]  Ethanol     Status: None   Collection Time: 01/31/20  9:18 AM  Result Value Ref Range   Alcohol, Ethyl (B) <10 <10 mg/dL    Comment: (NOTE) Lowest detectable limit for serum alcohol is 10 mg/dL.  For medical purposes only. Performed at [facility], [address] [place], [place] [address]   CBC with Diff     Status: Abnormal   Collection Time: 01/31/20  9:18 AM  Result Value Ref Range   WBC 13.0 (H) 4.0 - 10.5 K/uL   RBC 5.69 (H) 3.87 - 5.11 MIL/uL   Hemoglobin 15.1 (H) 12.0 - 15.0 g/dL   HCT 12.1 36 - 46 %   MCV 76.1 (L) 80.0 - 100.0 fL   MCH 26.5 26.0 - 34.0 pg   MCHC 34.9 30.0 - 36.0 g/dL   RDW 97.5 (H) 88.3 - 25.4 %   Platelets 352 150 - 400 K/uL   nRBC 0.0 0.0 - 0.2 %   Neutrophils Relative % 86 %   Neutro Abs 11.2 (H) 1.7 - 7.7 K/uL   Lymphocytes Relative 8 %   Lymphs Abs 1.0 0.7 - 4.0 K/uL   Monocytes Relative 6 %   Monocytes Absolute 0.8 0 - 1 K/uL   Eosinophils Relative 0 %   Eosinophils Absolute 0.0 0 - 0 K/uL   Basophils Relative 0 %   Basophils Absolute 0.1 0 - 0 K/uL   Immature Granulocytes 0 %   Abs Immature Granulocytes 0.04 0.00 - 0.07 K/uL    Comment: Performed at [facility], [address] [place], [place] [address]  Salicylate level     Status: Abnormal   Collection Time: 01/31/20  9:18 AM  Result Value Ref Range   Salicylate Lvl <7.0 (L) 7.0 - 30.0 mg/dL    Comment: Performed at  [facility], [address] [place], [place] [address]  Acetaminophen level     Status: Abnormal   Collection Time: 01/31/20  9:18 AM  Result Value Ref Range   Acetaminophen (Tylenol), Serum <10 (L) 10  - 30 ug/mL    Comment: (NOTE) Therapeutic concentrations vary significantly. A range of 10-30 ug/mL  may be an effective concentration for many patients. However, some  are best treated at concentrations outside of this range. Acetaminophen concentrations >150 ug/mL at 4 hours after ingestion  and >50 ug/mL at 12 hours after ingestion are often associated with  toxic reactions.  Performed at [facility], [address] [place], [place] [address]   CK     Status: Abnormal   Collection Time: 01/31/20  9:18 AM  Result Value Ref Range   Total CK 604 (H) 38.0 - 234.0 U/L    Comment: Performed at [facility], [address] [place], [place] [address]  hCG, quantitative, pregnancy     Status: None   Collection Time: 01/31/20  9:18 AM  Result Value Ref Range   hCG, Beta Chain, Quant, S <1 <5 mIU/mL    Comment:          GEST. AGE      CONC.  (mIU/mL)   <=1 WEEK        5 - 50     2 WEEKS       50 - 500     3 WEEKS       100 - 10,000     4 WEEKS     1,000 - 30,000     5 WEEKS     3,500 - 115,000   6-8 WEEKS     12,000 - 270,000    12 WEEKS     15,000 - 220,000        FEMALE AND NON-PREGNANT FEMALE:     LESS THAN 5 mIU/mL Performed at [facility], [address] [place], [place] [address]   Lactic acid, plasma     Status: Abnormal   Collection Time: 01/31/20  9:18 AM  Result Value Ref Range   Lactic Acid, Venous 2.3 (HH) 0.5 - 1.9 mmol/L    Comment: CRITICAL RESULT CALLED TO, READ BACK BY AND VERIFIED WITH BILL SMITH ON 01/31/20 AT 0947 QSD Performed at [facility], [address] [place], [place] [address]   Protime-INR     Status: None   Collection Time: 01/31/20  9:18 AM  Result Value Ref Range   Prothrombin Time 14.3 11.4 - 15.2 seconds   INR 1.2 0.8 - 1.2    Comment: (NOTE) INR goal varies based on device and disease states. Performed at [facility], [address] [place], [place] [address]   Blood Culture (routine  x 2)     Status: None (Preliminary result)   Collection Time: 01/31/20  9:18 AM   Specimen: BLOOD  Result Value Ref Range   Specimen Description BLOOD RIGHT ASSIST CONTROL    Special Requests      BOTTLES DRAWN AEROBIC AND ANAEROBIC Blood Culture results may not be optimal due to an excessive volume of blood received in culture bottles   Culture      NO GROWTH < 24 HOURS Performed at [facility], [address] [place] [address] [place], [place] [address]    Report Status PENDING   Procalcitonin - Baseline     Status: None   Collection Time: 01/31/20  9:18  AM  Result Value Ref Range   Procalcitonin <0.10 ng/mL    Comment:        Interpretation: PCT (Procalcitonin) <= 0.5 ng/mL: Systemic infection (sepsis) is not likely. Local bacterial infection is possible. (NOTE)       Sepsis PCT Algorithm           Lower Respiratory Tract                                      Infection PCT Algorithm    ----------------------------     ----------------------------         PCT < 0.25 ng/mL                PCT < 0.10 ng/mL          Strongly encourage             Strongly discourage   discontinuation of antibiotics    initiation of antibiotics    ----------------------------     -----------------------------       PCT 0.25 - 0.50 ng/mL            PCT 0.10 - 0.25 ng/mL               OR       >80% decrease in PCT            Discourage initiation of                                            antibiotics      Encourage discontinuation           of antibiotics    ----------------------------     -----------------------------         PCT >= 0.50 ng/mL              PCT 0.26 - 0.50 ng/mL               AND        <80% decrease in PCT             Encourage initiation of                                             antibiotics       Encourage continuation           of antibiotics    ----------------------------     -----------------------------        PCT >= 0.50 ng/mL                  PCT > 0.50 ng/mL                AND         increase in PCT                  Strongly encourage                                      initiation of antibiotics    Strongly encourage escalation  of antibiotics                                     -----------------------------                                           PCT <= 0.25 ng/mL                                                 OR                                        > 80% decrease in PCT                                      Discontinue / Do not initiate                                             antibiotics  Performed at [facility], [address] [place], [place] [address]   Blood Culture (routine x 2)     Status: None (Preliminary result)   Collection Time: 01/31/20  9:19 AM   Specimen: BLOOD  Result Value Ref Range   Specimen Description BLOOD LEFT HAND    Special Requests      BOTTLES DRAWN AEROBIC AND ANAEROBIC Blood Culture adequate volume   Culture  Setup Time      GRAM POSITIVE COCCI ANAEROBIC BOTTLE ONLY Organism ID to follow CRITICAL RESULT CALLED TO, READ BACK BY AND VERIFIED WITH: Hart Robinsons PHARMD [id number] 02/01/20 HNM Performed at [facility], [address] [place], [place] [address]    Culture GRAM POSITIVE COCCI    Report Status PENDING   Blood Culture ID Panel (Reflexed)     Status: Abnormal   Collection Time: 01/31/20  9:19 AM  Result Value Ref Range   Enterococcus faecalis NOT DETECTED NOT DETECTED   Enterococcus Faecium NOT DETECTED NOT DETECTED   Listeria monocytogenes NOT DETECTED NOT DETECTED   Staphylococcus species DETECTED (A) NOT DETECTED    Comment: CRITICAL RESULT CALLED TO, READ BACK BY AND VERIFIED WITH: SCOTT HALL PHARMD [id number] 02/01/20 HNM    Staphylococcus aureus (BCID) NOT DETECTED NOT DETECTED   Staphylococcus epidermidis DETECTED (A) NOT DETECTED    Comment: CRITICAL RESULT CALLED TO, READ BACK BY AND VERIFIED WITH: SCOTT HALL PHARMD [id number] 02/01/20 HNM    Staphylococcus lugdunensis  NOT DETECTED NOT DETECTED   Streptococcus species NOT DETECTED NOT DETECTED   Streptococcus agalactiae NOT DETECTED NOT DETECTED   Streptococcus pneumoniae NOT DETECTED NOT DETECTED   Streptococcus pyogenes NOT DETECTED NOT DETECTED   A.calcoaceticus-baumannii NOT DETECTED NOT DETECTED   Bacteroides fragilis NOT DETECTED NOT DETECTED   Enterobacterales NOT DETECTED NOT DETECTED   Enterobacter cloacae complex NOT DETECTED NOT DETECTED   Escherichia coli NOT DETECTED NOT DETECTED   Klebsiella aerogenes NOT  DETECTED NOT DETECTED   Klebsiella oxytoca NOT DETECTED NOT DETECTED   Klebsiella pneumoniae NOT DETECTED NOT DETECTED   Proteus species NOT DETECTED NOT DETECTED   Salmonella species NOT DETECTED NOT DETECTED   Serratia marcescens NOT DETECTED NOT DETECTED   Haemophilus influenzae NOT DETECTED NOT DETECTED   Neisseria meningitidis NOT DETECTED NOT DETECTED   Pseudomonas aeruginosa NOT DETECTED NOT DETECTED   Stenotrophomonas maltophilia NOT DETECTED NOT DETECTED   Candida albicans NOT DETECTED NOT DETECTED   Candida auris NOT DETECTED NOT DETECTED   Candida glabrata NOT DETECTED NOT DETECTED   Candida krusei NOT DETECTED NOT DETECTED   Candida parapsilosis NOT DETECTED NOT DETECTED   Candida tropicalis NOT DETECTED NOT DETECTED   Cryptococcus neoformans/gattii NOT DETECTED NOT DETECTED   Methicillin resistance mecA/C NOT DETECTED NOT DETECTED    Comment: Performed at [facility], [address], [place], [place] [address]  Urine Drug Screen, Qualitative     Status: Abnormal   Collection Time: 01/31/20 11:07 AM  Result Value Ref Range   Tricyclic, Ur Screen NONE DETECTED NONE DETECTED   Amphetamines, Ur Screen POSITIVE (A) NONE DETECTED   MDMA (Ecstasy)Ur Screen NONE DETECTED NONE DETECTED   Cocaine Metabolite,Ur [place] NONE DETECTED NONE DETECTED   Opiate, Ur Screen NONE DETECTED NONE DETECTED   Phencyclidine (PCP) Ur S NONE DETECTED NONE DETECTED   Cannabinoid 50 Ng, Ur  [place] NONE DETECTED NONE DETECTED   Barbiturates, Ur Screen NONE DETECTED NONE DETECTED   Benzodiazepine, Ur Scrn POSITIVE (A) NONE DETECTED   Methadone Scn, Ur NONE DETECTED NONE DETECTED    Comment: (NOTE) Tricyclics + metabolites, urine    Cutoff 1000 ng/mL Amphetamines + metabolites, urine  Cutoff 1000 ng/mL MDMA (Ecstasy), urine              Cutoff 500 ng/mL Cocaine Metabolite, urine          Cutoff 300 ng/mL Opiate + metabolites, urine        Cutoff 300 ng/mL Phencyclidine (PCP), urine         Cutoff 25 ng/mL Cannabinoid, urine                 Cutoff 50 ng/mL Barbiturates + metabolites, urine  Cutoff 200 ng/mL Benzodiazepine, urine              Cutoff 200 ng/mL Methadone, urine                   Cutoff 300 ng/mL  The urine drug screen provides only a preliminary, unconfirmed analytical test result and should not be used for non-medical purposes. Clinical consideration and professional judgment should be applied to any positive drug screen result due to possible interfering substances. A more specific alternate chemical method must be used in order to obtain a confirmed analytical result. Gas chromatography / mass spectrometry (GC/MS) is the preferred confirm atory method. Performed at [facility], [address] [place], [place] [address]   Urinalysis, Complete w Microscopic     Status: Abnormal   Collection Time: 01/31/20 11:07 AM  Result Value Ref Range   Color, Urine AMBER (A) YELLOW    Comment: BIOCHEMICALS MAY BE AFFECTED BY COLOR   APPearance HAZY (A) CLEAR   Specific Gravity, Urine 1.031 (H) 1.005 - 1.030   pH 5.0 5.0 - 8.0   Glucose, UA NEGATIVE NEGATIVE mg/dL   Hgb urine dipstick NEGATIVE NEGATIVE   Bilirubin Urine NEGATIVE NEGATIVE   Ketones, ur 20 (A) NEGATIVE  mg/dL   Protein, ur 100 (A) NEGATIVE mg/dL   Nitrite NEGATIVE NEGATIVE   Leukocytes,Ua NEGATIVE NEGATIVE   RBC / HPF 0-5 0 - 5 RBC/hpf   WBC, UA 6-10 0 - 5 WBC/hpf   Bacteria, UA RARE (A)  NONE SEEN   Squamous Epithelial / LPF 0-5 0 - 5   Mucus PRESENT    Hyaline Casts, UA PRESENT     Comment: Performed at [facility], [address] [place], [place] [address]  HIV Antibody (routine testing w rflx)     Status: None   Collection Time: 01/31/20  5:08 PM  Result Value Ref Range   HIV Screen 4th Generation wRfx Non Reactive Non Reactive    Comment: Performed at [facility], [place] [address], [place], [place] [address]  Magnesium     Status: None   Collection Time: 01/31/20  5:08 PM  Result Value Ref Range   Magnesium 2.0 1.7 - 2.4 mg/dL    Comment: Performed at [facility], [address] [place], [place] [address]  Phosphorus     Status: None   Collection Time: 01/31/20  5:08 PM  Result Value Ref Range   Phosphorus 2.6 2.5 - 4.6 mg/dL    Comment: Performed at [facility], [address] [place], [place] [address]  Lactic acid, plasma     Status: None   Collection Time: 01/31/20  5:10 PM  Result Value Ref Range   Lactic Acid, Venous 0.7 0.5 - 1.9 mmol/L    Comment: Performed at [facility], [address] [place], [place] [address]  Protime-INR     Status: None   Collection Time: 02/01/20  4:11 AM  Result Value Ref Range   Prothrombin Time 14.1 11.4 - 15.2 seconds   INR 1.1 0.8 - 1.2    Comment: (NOTE) INR goal varies based on device and disease states. Performed at [facility], [address] [place], [place] [address]   Cortisol-am, blood     Status: None   Collection Time: 02/01/20  4:11 AM  Result Value Ref Range   Cortisol - AM 9.5 6.7 - 22.6 ug/dL    Comment: Performed at [place] [facility] [address], [place], [place] [address]  Procalcitonin     Status: None   Collection Time: 02/01/20  4:11 AM  Result Value Ref Range   Procalcitonin <0.10 ng/mL    Comment:        Interpretation: PCT (Procalcitonin) <= 0.5 ng/mL: Systemic infection (sepsis) is not likely. Local bacterial infection  is possible. (NOTE)       Sepsis PCT Algorithm           Lower Respiratory Tract                                      Infection PCT Algorithm    ----------------------------     ----------------------------         PCT < 0.25 ng/mL                PCT < 0.10 ng/mL          Strongly encourage             Strongly discourage   discontinuation of antibiotics    initiation of antibiotics    ----------------------------     -----------------------------  PCT 0.25 - 0.50 ng/mL            PCT 0.10 - 0.25 ng/mL               OR       >80% decrease in PCT            Discourage initiation of                                            antibiotics      Encourage discontinuation           of antibiotics    ----------------------------     -----------------------------         PCT >= 0.50 ng/mL              PCT 0.26 - 0.50 ng/mL               AND        <80% decrease in PCT             Encourage initiation of                                             antibiotics       Encourage continuation           of antibiotics    ----------------------------     -----------------------------        PCT >= 0.50 ng/mL                  PCT > 0.50 ng/mL               AND         increase in PCT                  Strongly encourage                                      initiation of antibiotics    Strongly encourage escalation           of antibiotics                                     -----------------------------                                           PCT <= 0.25 ng/mL                                                 OR                                        > 80% decrease in PCT  Discontinue / Do not initiate                                             antibiotics  Performed at [facility], [address] [place], [place] [address]   Basic metabolic panel     Status: Abnormal   Collection Time: 02/01/20  4:11 AM  Result Value Ref Range   Sodium  135 135 - 145 mmol/L   Potassium 4.0 3.5 - 5.1 mmol/L   Chloride 108 98 - 111 mmol/L   CO2 23 22 - 32 mmol/L   Glucose, Bld 120 (H) 70 - 99 mg/dL    Comment: Glucose reference range applies only to samples taken after fasting for at least 8 hours.   BUN 8 6 - 20 mg/dL   Creatinine, Ser 0.45 0.44 - 1.00 mg/dL   Calcium 8.2 (L) 8.9 - 10.3 mg/dL   GFR calc non Af Amer >60 >60 mL/min   GFR calc Af Amer >60 >60 mL/min   Anion gap 4 (L) 5 - 15    Comment: Performed at [facility], [address] [place], [place] [address]  CBC     Status: Abnormal   Collection Time: 02/01/20  4:11 AM  Result Value Ref Range   WBC 4.8 4.0 - 10.5 K/uL   RBC 4.45 3.87 - 5.11 MIL/uL   Hemoglobin 12.2 12.0 - 15.0 g/dL   HCT 35.8 (L) 36 - 46 %   MCV 80.4 80.0 - 100.0 fL   MCH 27.4 26.0 - 34.0 pg   MCHC 34.1 30.0 - 36.0 g/dL   RDW 16.4 (H) 11.5 - 15.5 %   Platelets 201 150 - 400 K/uL   nRBC 0.0 0.0 - 0.2 %    Comment: Performed at [facility], [address] [place], [place] [address]  CK     Status: Abnormal   Collection Time: 02/01/20  4:11 AM  Result Value Ref Range   Total CK 464 (H) 38.0 - 234.0 U/L    Comment: Performed at [facility], [address] [place],  [address]   No components found for: ESR, C REACTIVE PROTEIN MICRO: Recent Results (from the past 720 hour(s))  SARS Coronavirus 2 by RT PCR (hospital order, performed in [facility] hospital lab) Nasopharyngeal Nasopharyngeal Swab     Status: None   Collection Time: 01/31/20  9:18 AM   Specimen: Nasopharyngeal Swab  Result Value Ref Range Status   SARS Coronavirus 2 NEGATIVE NEGATIVE Final    Comment: (NOTE) SARS-CoV-2 target nucleic acids are NOT DETECTED.  The SARS-CoV-2 RNA is generally detectable in upper and lower respiratory specimens during the acute phase of infection. The lowest concentration of SARS-CoV-2 viral copies this assay can detect is 250 copies / mL. A negative result does  not preclude SARS-CoV-2 infection and should not be used as the sole basis for treatment or other patient management decisions.  A negative result may occur with improper specimen collection / handling, submission of specimen other than nasopharyngeal swab, presence of viral mutation(s) within the areas targeted by this assay, and inadequate number of viral copies (<250 copies / mL). A negative result must be combined with clinical observations, patient history, and epidemiological information.  Fact Sheet for Patients:   StrictlyIdeas.no  Fact Sheet for Healthcare Providers: BankingDealers.co.za  This test is not  yet approved or  cleared by the [place] and has been authorized for detection and/or diagnosis of SARS-CoV-2 by FDA under an Emergency Use Authorization (EUA).  This EUA will remain in effect (meaning this test can be used) for the duration of the COVID-19 declaration under Section 564(b)(1) of the Act, 21 U.S.C. section 360bbb-3(b)(1), unless the authorization is terminated or revoked sooner.  Performed at [facility], [address] [place], [place] [address]   Blood Culture (routine x 2)     Status: None (Preliminary result)   Collection Time: 01/31/20  9:18 AM   Specimen: BLOOD  Result Value Ref Range Status   Specimen Description BLOOD RIGHT ASSIST CONTROL  Final   Special Requests   Final    BOTTLES DRAWN AEROBIC AND ANAEROBIC Blood Culture results may not be optimal due to an excessive volume of blood received in culture bottles   Culture   Final    NO GROWTH < 24 HOURS Performed at [facility], [address] [place], [place] [address]    Report Status PENDING  Incomplete  Blood Culture (routine x 2)     Status: None (Preliminary result)   Collection Time: 01/31/20  9:19 AM   Specimen: BLOOD  Result Value Ref Range Status   Specimen Description BLOOD LEFT HAND  Final   Special  Requests   Final    BOTTLES DRAWN AEROBIC AND ANAEROBIC Blood Culture adequate volume   Culture  Setup Time   Final    GRAM POSITIVE COCCI ANAEROBIC BOTTLE ONLY Organism ID to follow CRITICAL RESULT CALLED TO, READ BACK BY AND VERIFIED WITH: Hart Robinsons PHARMD [id number] 02/01/20 HNM Performed at [facility], [address] [place], [place] [address]    Culture GRAM POSITIVE COCCI  Final   Report Status PENDING  Incomplete  Blood Culture ID Panel (Reflexed)     Status: Abnormal   Collection Time: 01/31/20  9:19 AM  Result Value Ref Range Status   Enterococcus faecalis NOT DETECTED NOT DETECTED Final   Enterococcus Faecium NOT DETECTED NOT DETECTED Final   Listeria monocytogenes NOT DETECTED NOT DETECTED Final   Staphylococcus species DETECTED (A) NOT DETECTED Final    Comment: CRITICAL RESULT CALLED TO, READ BACK BY AND VERIFIED WITH: SCOTT HALL PHARMD [id number] 02/01/20 HNM    Staphylococcus aureus (BCID) NOT DETECTED NOT DETECTED Final   Staphylococcus epidermidis DETECTED (A) NOT DETECTED Final    Comment: CRITICAL RESULT CALLED TO, READ BACK BY AND VERIFIED WITH: SCOTT HALL PHARMD [id number] 02/01/20 HNM    Staphylococcus lugdunensis NOT DETECTED NOT DETECTED Final   Streptococcus species NOT DETECTED NOT DETECTED Final   Streptococcus agalactiae NOT DETECTED NOT DETECTED Final   Streptococcus pneumoniae NOT DETECTED NOT DETECTED Final   Streptococcus pyogenes NOT DETECTED NOT DETECTED Final   A.calcoaceticus-baumannii NOT DETECTED NOT DETECTED Final   Bacteroides fragilis NOT DETECTED NOT DETECTED Final   Enterobacterales NOT DETECTED NOT DETECTED Final   Enterobacter cloacae complex NOT DETECTED NOT DETECTED Final   Escherichia coli NOT DETECTED NOT DETECTED Final   Klebsiella aerogenes NOT DETECTED NOT DETECTED Final   Klebsiella oxytoca NOT DETECTED NOT DETECTED Final   Klebsiella pneumoniae NOT DETECTED NOT DETECTED Final   Proteus species NOT DETECTED NOT DETECTED Final    Salmonella species NOT DETECTED NOT DETECTED Final   Serratia marcescens NOT DETECTED NOT DETECTED Final   Haemophilus influenzae NOT DETECTED NOT DETECTED Final   Neisseria meningitidis NOT DETECTED NOT DETECTED  Final   Pseudomonas aeruginosa NOT DETECTED NOT DETECTED Final   Stenotrophomonas maltophilia NOT DETECTED NOT DETECTED Final   Candida albicans NOT DETECTED NOT DETECTED Final   Candida auris NOT DETECTED NOT DETECTED Final   Candida glabrata NOT DETECTED NOT DETECTED Final   Candida krusei NOT DETECTED NOT DETECTED Final   Candida parapsilosis NOT DETECTED NOT DETECTED Final   Candida tropicalis NOT DETECTED NOT DETECTED Final   Cryptococcus neoformans/gattii NOT DETECTED NOT DETECTED Final   Methicillin resistance mecA/C NOT DETECTED NOT DETECTED Final    Comment: Performed at [facility], [address] [place], [place] [address]    IMAGING: DG Chest 2 View  Result Date: 01/31/2020 CLINICAL DATA:  Evaluate source of fevers. EXAM: CHEST - 2 VIEW COMPARISON:  Earlier today FINDINGS: The heart size and mediastinal contours are within normal limits. Both lungs are clear. The visualized skeletal structures are unremarkable. IMPRESSION: No active cardiopulmonary disease. Electronically Signed   By: Kerby Moors M.D.   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 Sagittal and coronal MPR images reconstructed from axial data set. CONTRAST:  156mL OMNIPAQUE IOHEXOL 350 MG/ML SOLN IV. Dilute oral contrast. COMPARISON:  12/05/2019 Correlation: CT RIGHT hip 09/02/2019 FINDINGS: Lower chest: Lung bases clear Hepatobiliary: Gallbladder surgically absent. Liver normal appearance  Pancreas: Normal appearance Spleen: Normal appearance Adrenals/Urinary Tract: Adrenal glands, kidneys, ureters, and bladder normal appearance Stomach/Bowel: Normal appendix. Stomach and bowel loops normal appearance. Vascular/Lymphatic: Vascular structures patent.  No adenopathy. Reproductive: Normal size and morphology of uterus and ovaries. IUD identified, demonstrating transmural penetration of both limbs through the fundal wall of the uterus, with the more inferior limb demonstrating slightly greater penetration than on the previous exam Other: No free air or free fluid. No hernia or inflammatory process. Thickening at umbilicus versus prior exam likely reflects interval in bili cul hernia repair. 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 IMPRESSION: No acute intra-abdominal or intrapelvic abnormalities. IUD identified, demonstrating transmural penetration of both limbs through the fundal wall of the uterus, with the more inferior limb demonstrating slightly greater penetration than on the previous exam. 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 Electronically Signed   By: Lavonia Dana M.D.   On: 01/31/2020 14:40   DG Chest Port 1 View  Result Date: 01/31/2020 CLINICAL DATA:  Questionable sepsis EXAM: PORTABLE CHEST 1 VIEW COMPARISON:  08/09/2019 FINDINGS: Low volume AP portable examination with subtle bibasilar heterogeneous and interstitial airspace opacity. The heart and mediastinum are unremarkable. The bones and upper abdomen are unremarkable. IMPRESSION: Low volume AP portable examination with subtle bibasilar heterogeneous and interstitial airspace opacity, concerning for infection, although this appearance may be exaggerated by low volume technique and partial atelectasis. Consider PA and lateral radiographs  to further evaluate. Electronically Signed   By: Eddie Candle M.D.   On: 01/31/2020 10:39   ECHOCARDIOGRAM COMPLETE  Result Date: 02/01/2020    ECHOCARDIOGRAM REPORT   Patient Name:  Leslie Molina Date of      02/01/2020  Exam: Medical Rec #: [id number]  Height:      66.0 in Accession #:   [id number] Weight:      220.0 lb Date of Birth: 1986-01-20  BSA:         2.082 m Patient Age:   34 years   BP:          Not listed in chart/Not listed in chart                                        mmHg Patient        F          HR:          115 bpm. Gender: Exam Location: [facility] Procedure: 2D Echo, Cardiac Doppler and Color Doppler Indications:     Bacteremia 790.7  History:         Patient has no prior history of Echocardiogram examinations.                  Anxiety, bipolar disorder.  Sonographer:     Cristela Blue RDCS (AE) Referring Phys:  [id number] Lurene Shadow Diagnosing Phys: Adrian Blackwater MD IMPRESSIONS  1. Left ventricular ejection fraction, by estimation, is 60 to 65%. The left ventricle has normal function. The left ventricle has no regional wall motion abnormalities. Left ventricular diastolic parameters were normal.  2. Right ventricular systolic function is normal. The right ventricular size is normal. There is normal pulmonary artery systolic pressure.  3. The mitral valve is normal in structure. No evidence of mitral valve regurgitation. No evidence of mitral stenosis.  4. The aortic valve is normal in structure. Aortic valve regurgitation is not visualized. No aortic stenosis is present.  5. The inferior vena cava is normal in size with greater than 50% respiratory variability, suggesting right atrial pressure of 3 mmHg. Conclusion(s)/Recommendation(s): No evidence of valvular vegetations on this transthoracic echocardiogram. Would recommend a transesophageal echocardiogram to exclude infective endocarditis if clinically indicated. FINDINGS  Left Ventricle: Left ventricular ejection fraction, by  estimation, is 60 to 65%. The left ventricle has normal function. The left ventricle has no regional wall motion abnormalities. The left ventricular internal cavity size was normal in size. There is  no left ventricular hypertrophy. Left ventricular diastolic parameters were normal. Right Ventricle: The right ventricular size is normal. No increase in right ventricular wall thickness. Right ventricular systolic function is normal. There is normal pulmonary artery systolic pressure. The tricuspid regurgitant velocity is 1.29 m/s, and  with an assumed right atrial pressure of 10 mmHg, the estimated right ventricular systolic pressure is 16.7 mmHg. Left Atrium: Left atrial size was normal in size. Right Atrium: Right atrial size was normal in size. Pericardium: There is no evidence of pericardial effusion. Mitral Valve: The mitral valve is normal in structure. Normal mobility of the mitral valve leaflets. No evidence of mitral valve regurgitation. No evidence of mitral valve stenosis. Tricuspid Valve: The tricuspid valve is normal in structure. Tricuspid valve regurgitation is not demonstrated. No evidence of tricuspid stenosis. Aortic Valve: The aortic valve is normal in structure. Aortic valve regurgitation is not visualized. No aortic stenosis is present. Aortic valve mean gradient measures 3.5 mmHg. Aortic valve peak gradient measures 6.3 mmHg. Aortic valve area, by VTI measures 4.38 cm. Pulmonic Valve: The pulmonic valve was normal in structure. Pulmonic valve regurgitation is not visualized. No evidence of pulmonic stenosis.  Aorta: The aortic root is normal in size and structure. Venous: The inferior vena cava is normal in size with greater than 50% respiratory variability, suggesting right atrial pressure of 3 mmHg. IAS/Shunts: No atrial level shunt detected by color flow Doppler.  LEFT VENTRICLE PLAX 2D LVIDd:         3.55 cm  Diastology LVIDs:         2.23 cm  LV e' lateral:   16.00 cm/s LV PW:         1.03  cm  LV E/e' lateral: 6.4 LV IVS:        0.92 cm  LV e' medial:    17.60 cm/s LVOT diam:     2.20 cm  LV E/e' medial:  5.9 LV SV:         88 LV SV Index:   42 LVOT Area:     3.80 cm  RIGHT VENTRICLE RV S prime:     12.90 cm/s TAPSE (M-mode): 3.5 cm LEFT ATRIUM             Index       RIGHT ATRIUM          Index LA diam:        3.10 cm 1.49 cm/m  RA Area:     9.57 cm LA Vol (A2C):   41.0 ml 19.69 ml/m RA Volume:   17.30 ml 8.31 ml/m LA Vol (A4C):   38.3 ml 18.39 ml/m LA Biplane Vol: 41.2 ml 19.78 ml/m  AORTIC VALVE                   PULMONIC VALVE AV Area (Vmax):    3.21 cm    PV Vmax:        0.86 m/s AV Area (Vmean):   3.33 cm    PV Peak grad:   3.0 mmHg AV Area (VTI):     4.38 cm    RVOT Peak grad: 5 mmHg AV Vmax:           125.50 cm/s AV Vmean:          87.150 cm/s AV VTI:            0.202 m AV Peak Grad:      6.3 mmHg AV Mean Grad:      3.5 mmHg LVOT Vmax:         106.00 cm/s LVOT Vmean:        76.300 cm/s LVOT VTI:          0.232 m LVOT/AV VTI ratio: 1.15  AORTA Ao Root diam: 2.90 cm MITRAL VALVE                TRICUSPID VALVE MV Area (PHT): 5.75 cm     TR Peak grad:   6.7 mmHg MV Decel Time: 132 msec     TR Vmax:        129.00 cm/s MV E velocity: 103.00 cm/s MV A velocity: 35.40 cm/s   SHUNTS MV E/A ratio:  2.91         Systemic VTI:  0.23 m                             Systemic Diam: 2.20 cm Neoma Laming MD Electronically signed by Neoma Laming MD Signature Date/Time: 02/01/2020/12:06:14 PM    Final     Assessment:   Leslie Molina is a 34 y.o. 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 S/p 4 weeks vanco for MRSA bacteremia, multuple septic sites. SIgned out AMA - was supposed to take 2 weeks doxy and tells me she did take some abx Now readmitted with confusion after using drugs again.  Did have fever and mild wbc. BCX + 1/2 CNS.  TTE neg.  Rec I suspect this is all contaminant but would follow up. Would defer TEE unless further + BCX. Follow  fever curve and wbc. If no further fevers and further bcx neg would dc abx and monitor    Recommendations  Thank you very much for allowing me to participate in the care of this patient. Please call with questions.   Cheral Marker. Ola Spurr, MD

## 2020-02-01 NOTE — ED Notes (Signed)
Pharmacy to send new bag of vanc as one sent is not correct

## 2020-02-01 NOTE — ED Notes (Signed)
IVC, does not meet criteria for psych inpt per Waylan Boga, NP

## 2020-02-01 NOTE — TOC Initial Note (Signed)
Transition of Care [facility]) - Initial/Assessment Note    Patient Details  Name: Leslie Molina MRN: [id number] Date of Birth: Oct 13, 1985  [facility] CM/SW Contact:    Anselm Pancoast, RN Phone Number: 02/01/2020, 10:07 AM  Clinical Narrative:                 TOC received consult for Substance Abuse Counseling. TTS following patient and already provided outpatient resources as needed. No further needs from TOC.        Patient Goals and CMS Choice        Expected Discharge Plan and Services                                                Prior Living Arrangements/Services                       Activities of Daily Living Home Assistive Devices/Equipment: None ADL Screening (condition at time of admission) Patient's cognitive ability adequate to safely complete daily activities?: Yes Is the patient deaf or have difficulty hearing?: No Does the patient have difficulty seeing, even when wearing glasses/contacts?: No Does the patient have difficulty concentrating, remembering, or making decisions?: No Patient able to express need for assistance with ADLs?: Yes Does the patient have difficulty dressing or bathing?: No Independently performs ADLs?: Yes (appropriate for developmental age) Does the patient have difficulty walking or climbing stairs?: No Weakness of Legs: None Weakness of Arms/Hands: None  Permission Sought/Granted                  Emotional Assessment              Admission diagnosis:  AMS (altered mental status) [R41.82] Sepsis ([place]) [A41.9] Patient Active Problem List   Diagnosis Date Noted  . AMS (altered mental status) 01/31/2020  . SIRS (systemic inflammatory response syndrome) ([place]) 01/31/2020  . Rhabdomyolysis 01/31/2020  . Hypokalemia 01/31/2020  . Substance abuse ([place]) 01/31/2020  . Sepsis ([place]) 01/31/2020  . 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

## 2020-02-01 NOTE — ED Notes (Addendum)
Pt's R arm with slight swelling, redness, and minimally warmer than L arm. States she is worried she may have broken a needle tip off in her arm near the ac. R radial pulse 2+. This RN palpated site. Scarring noted. Pt also states her R hip has starting hurting as of the last 2 days. States she has a history of MRSA at the R hip in March 2021. States was in hospital for IV antibiotics to treat it. Attending [place] notified.

## 2020-02-01 NOTE — ED Notes (Signed)
Flagyl unavailable in pyxis, pharmacy notified

## 2020-02-02 ENCOUNTER — Encounter: Payer: Self-pay | Admitting: Internal Medicine

## 2020-02-02 DIAGNOSIS — D72829 Elevated white blood cell count, unspecified: Secondary | ICD-10-CM

## 2020-02-02 DIAGNOSIS — M47898 Other spondylosis, sacral and sacrococcygeal region: Secondary | ICD-10-CM

## 2020-02-02 DIAGNOSIS — F419 Anxiety disorder, unspecified: Secondary | ICD-10-CM

## 2020-02-02 DIAGNOSIS — F319 Bipolar disorder, unspecified: Secondary | ICD-10-CM

## 2020-02-02 DIAGNOSIS — T43621A Poisoning by amphetamines, accidental (unintentional), initial encounter: Secondary | ICD-10-CM

## 2020-02-02 DIAGNOSIS — L409 Psoriasis, unspecified: Secondary | ICD-10-CM

## 2020-02-02 DIAGNOSIS — R509 Fever, unspecified: Secondary | ICD-10-CM

## 2020-02-02 DIAGNOSIS — R4182 Altered mental status, unspecified: Secondary | ICD-10-CM

## 2020-02-02 DIAGNOSIS — Z9049 Acquired absence of other specified parts of digestive tract: Secondary | ICD-10-CM

## 2020-02-02 DIAGNOSIS — Z8614 Personal history of Methicillin resistant Staphylococcus aureus infection: Secondary | ICD-10-CM

## 2020-02-02 DIAGNOSIS — R000 Tachycardia, unspecified: Secondary | ICD-10-CM

## 2020-02-02 LAB — BASIC METABOLIC PANEL
Anion gap: 7 (ref 5–15)
BUN: <5 mg/dL — ABNORMAL LOW (ref 6–20)
CO2: 24 mmol/L (ref 22–32)
Calcium: 8.2 mg/dL — ABNORMAL LOW (ref 8.9–10.3)
Chloride: 108 mmol/L (ref 98–111)
Creatinine, Ser: 0.4 mg/dL — ABNORMAL LOW (ref 0.44–1.00)
GFR calc Af Amer: >60 mL/min (ref >60)
GFR calc non Af Amer: >60 mL/min (ref >60)
Glucose, Bld: 114 mg/dL — ABNORMAL HIGH (ref 70–99)
Potassium: 3.4 mmol/L — ABNORMAL LOW (ref 3.5–5.1)
Sodium: 139 mmol/L (ref 135–145)

## 2020-02-02 LAB — CBC WITH DIFFERENTIAL/PLATELET
Abs Immature Granulocytes: 0.01 10*3/uL (ref 0.00–0.07)
Basophils Absolute: 0 10*3/uL (ref 0.0–0.1)
Basophils Relative: 1 %
Eosinophils Absolute: 0.2 10*3/uL (ref 0.0–0.5)
Eosinophils Relative: 4 %
HCT: 35.1 % — ABNORMAL LOW (ref 36.0–46.0)
Hemoglobin: 11.7 g/dL — ABNORMAL LOW (ref 12.0–15.0)
Immature Granulocytes: 0 %
Lymphocytes Relative: 26 %
Lymphs Abs: 1.1 10*3/uL (ref 0.7–4.0)
MCH: 27.2 pg (ref 26.0–34.0)
MCHC: 33.3 g/dL (ref 30.0–36.0)
MCV: 81.6 fL (ref 80.0–100.0)
Monocytes Absolute: 0.4 10*3/uL (ref 0.1–1.0)
Monocytes Relative: 10 %
Neutro Abs: 2.6 10*3/uL (ref 1.7–7.7)
Neutrophils Relative %: 59 %
Platelets: 202 10*3/uL (ref 150–400)
RBC: 4.3 MIL/uL (ref 3.87–5.11)
RDW: 17 % — ABNORMAL HIGH (ref 11.5–15.5)
WBC: 4.4 10*3/uL (ref 4.0–10.5)
nRBC: 0 % (ref 0.0–0.2)

## 2020-02-02 LAB — MAGNESIUM: Magnesium: 1.8 mg/dL (ref 1.7–2.4)

## 2020-02-02 MED ORDER — HYDROXYZINE HCL 50 MG PO TABS
50.0000 mg | ORAL_TABLET | Freq: Four times a day (QID) | ORAL | Status: DC | PRN
  Administered 2020-02-02: 50 mg via ORAL
  Filled 2020-02-02 (×3): qty 1

## 2020-02-02 MED ORDER — BUPRENORPHINE HCL-NALOXONE HCL 8-2 MG SL SUBL
1.0000 | SUBLINGUAL_TABLET | Freq: Every day | SUBLINGUAL | Status: DC
  Administered 2020-02-02 – 2020-02-03 (×2): 1 via SUBLINGUAL
  Filled 2020-02-02 (×2): qty 1

## 2020-02-02 MED ORDER — MIRTAZAPINE 15 MG PO TABS
7.5000 mg | ORAL_TABLET | Freq: Every day | ORAL | Status: DC
  Administered 2020-02-02: 7.5 mg via ORAL
  Filled 2020-02-02: qty 1

## 2020-02-02 MED ORDER — DULOXETINE HCL 30 MG PO CPEP
90.0000 mg | ORAL_CAPSULE | Freq: Every day | ORAL | Status: DC
  Administered 2020-02-02 – 2020-02-03 (×2): 90 mg via ORAL
  Filled 2020-02-02 (×4): qty 3

## 2020-02-02 MED ORDER — VANCOMYCIN HCL 750 MG/150ML IV SOLN
750.0000 mg | Freq: Three times a day (TID) | INTRAVENOUS | Status: DC
  Administered 2020-02-02 (×2): 750 mg via INTRAVENOUS
  Filled 2020-02-02 (×4): qty 150

## 2020-02-02 MED ORDER — POTASSIUM CHLORIDE CRYS ER 20 MEQ PO TBCR
40.0000 meq | EXTENDED_RELEASE_TABLET | Freq: Once | ORAL | Status: AC
  Administered 2020-02-02: 40 meq via ORAL
  Filled 2020-02-02: qty 2

## 2020-02-02 NOTE — ED Notes (Signed)
Meal tray given to pt.

## 2020-02-02 NOTE — ED Notes (Signed)
pateitn is in nad on stretcher.   She is alert.  Asking for the suboxone.  Messaged pharmacy and they do have the tablet form wh ich she would like.
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
## 2020-02-02 NOTE — Progress Notes (Addendum)
Progress Note    Leslie Molina  MCN:[id number] DOB: 06/08/1985  DOA: 01/31/2020 PCP: Patient, No Pcp Per      Brief Narrative:    Medical records reviewed and are as summarized below:  Leslie Molina is a 34 y.o. female       Assessment/Plan:   Principal Problem:   Sepsis ([place]) Active Problems:   AMS (altered mental status)   SIRS (systemic inflammatory response syndrome) (HCC)   Rhabdomyolysis   Hypokalemia   Substance abuse (HCC)   Stimulant abuse (HCC)   Benzodiazepine abuse ([place])   Body mass index is 35.51 kg/m.  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 Continue IV vancomycin for now. Follow-up with ID for further recommendations. 2D echo showed EF estimated at 60 to 65%. No evidence of vegetations. Continue psychotropics. I have reached out to behavioral health unit team (via secure chat) to reevaluate the patient. Patient is still under IVC. Caroline Sauger NP will reevaluate the patient.    Diet Order            Diet regular Room service appropriate? Yes; Fluid consistency: Thin  Diet effective now                    Consultants:  Psychiatry  Infectious disease  Procedures:  None    Medications:   . buprenorphine-naloxone  1 tablet Sublingual Daily  . DULoxetine  90 mg Oral Daily  . enoxaparin (LOVENOX) injection  40 mg Subcutaneous Q24H  . folic acid  1 mg Oral Daily  . gabapentin  300 mg Oral TID  . midazolam  4 mg Intramuscular Once  . mirtazapine  7.5 mg Oral QHS  . multivitamin with minerals  1 tablet Oral Daily  . nicotine  21 mg Transdermal Daily  . thiamine   100 mg Oral Daily   Or  . thiamine  100 mg Intravenous Daily   Continuous Infusions: . vancomycin 750 mg (02/02/20 1454)     Anti-infectives (From admission, onward)   Start     Dose/Rate Route Frequency Ordered Stop   02/02/20 1400  vancomycin (VANCOREADY) IVPB 750 mg/150 mL        750 mg 150 mL/hr over 60 Minutes Intravenous Every 8 hours 02/02/20 1221     02/01/20 1000  vancomycin (VANCOCIN) IVPB 750 mg/150 ml premix  Status:  Discontinued        750 mg 150 mL/hr over 60 Minutes Intravenous Every 8 hours 02/01/20 0927 02/02/20 1221   01/31/20 2200  vancomycin (VANCOCIN) IVPB 750 mg/150 ml premix  Status:  Discontinued        750 mg 150 mL/hr over 60 Minutes Intravenous Every 12 hours 01/31/20 1239 02/01/20 0927   01/31/20 2200  ceFEPIme (MAXIPIME) 2 g in sodium chloride 0.9 % 100 mL IVPB  Status:  Discontinued        2 g 200 mL/hr over 30 Minutes Intravenous Every 8 hours 01/31/20 1245 02/02/20 1516   01/31/20 1300  ceFEPIme (MAXIPIME) 2 g in sodium chloride 0.9 % 100 mL IVPB        2 g 200 mL/hr over 30 Minutes Intravenous  Once 01/31/20 1206 01/31/20 1536  01/31/20 1300  metroNIDAZOLE (FLAGYL) IVPB 500 mg  Status:  Discontinued        500 mg 100 mL/hr over 60 Minutes Intravenous Every 8 hours 01/31/20 1206 02/02/20 1516   01/31/20 1300  vancomycin (VANCOCIN) IVPB 1000 mg/200 mL premix        1,000 mg 200 mL/hr over 60 Minutes Intravenous  Once 01/31/20 1206 01/31/20 1639   01/31/20 1300  vancomycin (VANCOCIN) IVPB 1000 mg/200 mL premix        1,000 mg 200 mL/hr over 60 Minutes Intravenous  Once 01/31/20 1235 01/31/20 1840   01/31/20 1000  vancomycin (VANCOCIN) IVPB 1000 mg/200 mL premix  Status:  Discontinued        1,000 mg 200 mL/hr over 60 Minutes Intravenous  Once 01/31/20 6578 01/31/20 1209             Family Communication/Anticipated D/C date and plan/Code Status   DVT prophylaxis: enoxaparin (LOVENOX) injection 40 mg Start: 01/31/20 1300     Code  Status: Full Code  Family Communication: Plan discussed with patient Disposition Plan:    Status is: Inpatient  Remains inpatient appropriate because:IV treatments appropriate due to intensity of illness or inability to take PO and Inpatient level of care appropriate due to severity of illness   Dispo: The patient is from: Home              Anticipated d/c is to: Home              Anticipated d/c date is: 2 days              Patient currently is not medically stable to d/c.           Subjective:   She is worried about the thickened area on the right antecubital area which is a puncture site for IV drug use. She complains of feeling anxious and depressed   Objective:    Vitals:   02/02/20 0700 02/02/20 0726 02/02/20 1000 02/02/20 1250  BP: 103/71  103/71 116/89  Pulse: 92  92 94  Resp: 19   16  Temp:  98 F (36.7 C)  98.1 F (36.7 C)  TempSrc:  Oral  Oral  SpO2: 100%   100%  Weight:      Height:       No data found.   Intake/Output Summary (Last 24 hours) at 02/02/2020 1519 Last data filed at 02/02/2020 0651 Gross per 24 hour  Intake 550 ml  Output --  Net 550 ml   Filed Weights   01/31/20 0930  Weight: 99.8 kg    Exam:  GEN: No acute distress SKIN: She has bruises on her face and upper extremities.  There is small induration at the right antecubital area (puncture site for IV drug use). EYES: EOMI ENT: MMM CV: Regular rate and rhythm PULM: No wheezing or rales heard ABD: Soft, nontender CNS: Alert, oriented x3.  Nonfocal EXT: No swelling, tenderness or erythema to suggest cellulitis of upper or lower extremities. PSYCH: Calm and cooperative. Flat affect   Data Reviewed:   I have personally reviewed following labs and imaging studies:  Labs: Labs show the following:   Basic Metabolic Panel: Recent Labs  Lab 01/31/20 0918 01/31/20 0918 01/31/20 1708 02/01/20 0411 02/02/20 0520  NA 139  --   --  135 139  K 2.8*   < >  --  4.0 3.4*   CL 103  --   --  108 108  CO2 20*  --   --  23 24  GLUCOSE 124*  --   --  120* 114*  BUN 19  --   --  8 <5*  CREATININE 1.23*  --   --  0.45 0.40*  CALCIUM 9.6  --   --  8.2* 8.2*  MG  --   --  2.0  --  1.8  PHOS  --   --  2.6  --   --    < > = values in this interval not displayed.   GFR Estimated Creatinine Clearance: 117 mL/min (A) (by C-G formula based on SCr of 0.4 mg/dL (L)). Liver Function Tests: Recent Labs  Lab 01/31/20 0918  AST 35  ALT 28  ALKPHOS 172*  BILITOT 1.3*  PROT 9.6*  ALBUMIN 5.3*   No results for input(s): LIPASE, AMYLASE in the last 168 hours. No results for input(s): AMMONIA in the last 168 hours. Coagulation profile Recent Labs  Lab 01/31/20 0918 02/01/20 0411  INR 1.2 1.1    CBC: Recent Labs  Lab 01/31/20 0918 02/01/20 0411 02/02/20 0520  WBC 13.0* 4.8 4.4  NEUTROABS 11.2*  --  2.6  HGB 15.1* 12.2 11.7*  HCT 43.3 35.8* 35.1*  MCV 76.1* 80.4 81.6  PLT 352 201 202   Cardiac Enzymes: Recent Labs  Lab 01/31/20 0918 02/01/20 0411  CKTOTAL 604* 464*   BNP (last 3 results) No results for input(s): PROBNP in the last 8760 hours. CBG: No results for input(s): GLUCAP in the last 168 hours. D-Dimer: No results for input(s): DDIMER in the last 72 hours. Hgb A1c: No results for input(s): HGBA1C in the last 72 hours. Lipid Profile: No results for input(s): CHOL, HDL, LDLCALC, TRIG, CHOLHDL, LDLDIRECT in the last 72 hours. Thyroid function studies: No results for input(s): TSH, T4TOTAL, T3FREE, THYROIDAB in the last 72 hours.  Invalid input(s): FREET3 Anemia work up: No results for input(s): VITAMINB12, FOLATE, FERRITIN, TIBC, IRON, RETICCTPCT in the last 72 hours. Sepsis Labs: Recent Labs  Lab 01/31/20 0918 01/31/20 1710 02/01/20 0411 02/02/20 0520  PROCALCITON <0.10  --  <0.10  --   WBC 13.0*  --  4.8 4.4  LATICACIDVEN 2.3* 0.7  --   --     Microbiology Recent Results (from the past 240 hour(s))  SARS Coronavirus 2 by  RT PCR (hospital order, performed in [facility] hospital lab) Nasopharyngeal Nasopharyngeal Swab     Status: None   Collection Time: 01/31/20  9:18 AM   Specimen: Nasopharyngeal Swab  Result Value Ref Range Status   SARS Coronavirus 2 NEGATIVE NEGATIVE Final    Comment: (NOTE) SARS-CoV-2 target nucleic acids are NOT DETECTED.  The SARS-CoV-2 RNA is generally detectable in upper and lower respiratory specimens during the acute phase of infection. The lowest concentration of SARS-CoV-2 viral copies this assay can detect is 250 copies / mL. A negative result does not preclude SARS-CoV-2 infection and should not be used as the sole basis for treatment or other patient management decisions.  A negative result may occur with improper specimen collection / handling, submission of specimen other than nasopharyngeal swab, presence of viral mutation(s) within the areas targeted by this assay, and inadequate number of viral copies (<250 copies / mL). A negative result must be combined with clinical observations, patient history, and epidemiological information.  Fact Sheet for Patients:   StrictlyIdeas.no  Fact Sheet for Healthcare Providers: BankingDealers.co.za  This test is not yet approved or  cleared by the  [place] Architectural [employer]ologist and has been authorized for detection and/or diagnosis of SARS-CoV-2 by FDA under an Print production planner (EUA).  This EUA will remain in effect (meaning this test can be used) for the duration of the COVID-19 declaration under Section 564(b)(1) of the Act, 21 U.S.C. section 360bbb-3(b)(1), unless the authorization is terminated or revoked sooner.  Performed at [facility], [address] [place], [place] [address]   Blood Culture (routine x 2)     Status: None (Preliminary result)   Collection Time: 01/31/20  9:18 AM   Specimen: BLOOD  Result Value Ref Range Status   Specimen Description BLOOD  RIGHT ASSIST CONTROL  Final   Special Requests   Final    BOTTLES DRAWN AEROBIC AND ANAEROBIC Blood Culture results may not be optimal due to an excessive volume of blood received in culture bottles   Culture   Final    NO GROWTH 2 DAYS Performed at [facility], [address], [place], [place] [address]    Report Status PENDING  Incomplete  Blood Culture (routine x 2)     Status: Abnormal (Preliminary result)   Collection Time: 01/31/20  9:19 AM   Specimen: BLOOD  Result Value Ref Range Status   Specimen Description   Final    BLOOD LEFT HAND Performed at [facility], [address] [place], [place] [address]    Special Requests   Final    BOTTLES DRAWN AEROBIC AND ANAEROBIC Blood Culture adequate volume Performed at [facility], [address], [place],  [address]    Culture  Setup Time   Final    GRAM POSITIVE COCCI ANAEROBIC BOTTLE ONLY Organism ID to follow CRITICAL RESULT CALLED TO, READ BACK BY AND VERIFIED WITH: Hart Robinsons PHARMD [id number] 02/01/20 HNM Performed at [facility], [address] [place],  [address]    Culture STAPHYLOCOCCUS EPIDERMIDIS (A)  Final   Report Status PENDING  Incomplete  Blood Culture ID Panel (Reflexed)     Status: Abnormal   Collection Time: 01/31/20  9:19 AM  Result Value Ref Range Status   Enterococcus faecalis NOT DETECTED NOT DETECTED Final   Enterococcus Faecium NOT DETECTED NOT DETECTED Final   Listeria monocytogenes NOT DETECTED NOT DETECTED Final   Staphylococcus species DETECTED (A) NOT DETECTED Final    Comment: CRITICAL RESULT CALLED TO, READ BACK BY AND VERIFIED WITH: SCOTT HALL PHARMD [id number] 02/01/20 HNM    Staphylococcus aureus (BCID) NOT DETECTED NOT DETECTED Final   Staphylococcus epidermidis DETECTED (A) NOT DETECTED Final    Comment: CRITICAL RESULT CALLED TO, READ BACK BY AND VERIFIED WITH: SCOTT HALL PHARMD [id number] 02/01/20 HNM    Staphylococcus lugdunensis NOT DETECTED NOT  DETECTED Final   Streptococcus species NOT DETECTED NOT DETECTED Final   Streptococcus agalactiae NOT DETECTED NOT DETECTED Final   Streptococcus pneumoniae NOT DETECTED NOT DETECTED Final   Streptococcus pyogenes NOT DETECTED NOT DETECTED Final   A.calcoaceticus-baumannii NOT DETECTED NOT DETECTED Final   Bacteroides fragilis NOT DETECTED NOT DETECTED Final   Enterobacterales NOT DETECTED NOT DETECTED Final   Enterobacter cloacae complex NOT DETECTED NOT DETECTED Final   Escherichia coli NOT DETECTED NOT DETECTED Final   Klebsiella aerogenes NOT DETECTED NOT DETECTED Final   Klebsiella oxytoca NOT DETECTED NOT DETECTED Final   Klebsiella pneumoniae NOT DETECTED NOT DETECTED Final   Proteus species NOT DETECTED NOT DETECTED Final   Salmonella species NOT DETECTED NOT DETECTED Final   Serratia marcescens  NOT DETECTED NOT DETECTED Final   Haemophilus influenzae NOT DETECTED NOT DETECTED Final   Neisseria meningitidis NOT DETECTED NOT DETECTED Final   Pseudomonas aeruginosa NOT DETECTED NOT DETECTED Final   Stenotrophomonas maltophilia NOT DETECTED NOT DETECTED Final   Candida albicans NOT DETECTED NOT DETECTED Final   Candida auris NOT DETECTED NOT DETECTED Final   Candida glabrata NOT DETECTED NOT DETECTED Final   Candida krusei NOT DETECTED NOT DETECTED Final   Candida parapsilosis NOT DETECTED NOT DETECTED Final   Candida tropicalis NOT DETECTED NOT DETECTED Final   Cryptococcus neoformans/gattii NOT DETECTED NOT DETECTED Final   Methicillin resistance mecA/C NOT DETECTED NOT DETECTED Final    Comment: Performed at [facility], [address] [place], [place] [address]    Procedures and diagnostic studies:  ECHOCARDIOGRAM COMPLETE  Result Date: 02/01/2020    ECHOCARDIOGRAM REPORT   Patient Name:  MONIQUE HEFTY Date of      02/01/2020                           Exam: Medical Rec #: [id number]  Height:      66.0 in Accession #:   [id number] Weight:      220.0 lb Date of Birth:  1986/01/21  BSA:         2.082 m Patient Age:   34 years   BP:          Not listed in chart/Not listed in chart                                        mmHg Patient        F          HR:          115 bpm. Gender: Exam Location: [facility] Procedure: 2D Echo, Cardiac Doppler and Color Doppler Indications:     Bacteremia 790.7  History:         Patient has no prior history of Echocardiogram examinations.                  Anxiety, bipolar disorder.  Sonographer:     Sherrie Sport RDCS (AE) Referring Phys:  [place] Diagnosing Phys: Neoma Laming MD IMPRESSIONS  1. Left ventricular ejection fraction, by estimation, is 60 to 65%. The left ventricle has normal function. The left ventricle has no regional wall motion abnormalities. Left ventricular diastolic parameters were normal.  2. Right ventricular systolic function is normal. The right ventricular size is normal. There is normal pulmonary artery systolic pressure.  3. The mitral valve is normal in structure. No evidence of mitral valve regurgitation. No evidence of mitral stenosis.  4. The aortic valve is normal in structure. Aortic valve regurgitation is not visualized. No aortic stenosis is present.  5. The inferior vena cava is normal in size with greater than 50% respiratory variability, suggesting right atrial pressure of 3 mmHg. Conclusion(s)/Recommendation(s): No evidence of valvular vegetations on this transthoracic echocardiogram. Would recommend a transesophageal echocardiogram to exclude infective endocarditis if clinically indicated. FINDINGS  Left Ventricle: Left ventricular ejection fraction, by estimation, is 60 to 65%. The left ventricle has normal function. The left ventricle has no regional wall motion abnormalities. The left ventricular internal cavity size was normal in size. There is  no left ventricular hypertrophy. Left ventricular diastolic parameters were normal. Right  Ventricle: The right ventricular size is normal. No increase in right  ventricular wall thickness. Right ventricular systolic function is normal. There is normal pulmonary artery systolic pressure. The tricuspid regurgitant velocity is 1.29 m/s, and  with an assumed right atrial pressure of 10 mmHg, the estimated right ventricular systolic pressure is 90.2 mmHg. Left Atrium: Left atrial size was normal in size. Right Atrium: Right atrial size was normal in size. Pericardium: There is no evidence of pericardial effusion. Mitral Valve: The mitral valve is normal in structure. Normal mobility of the mitral valve leaflets. No evidence of mitral valve regurgitation. No evidence of mitral valve stenosis. Tricuspid Valve: The tricuspid valve is normal in structure. Tricuspid valve regurgitation is not demonstrated. No evidence of tricuspid stenosis. Aortic Valve: The aortic valve is normal in structure. Aortic valve regurgitation is not visualized. No aortic stenosis is present. Aortic valve mean gradient measures 3.5 mmHg. Aortic valve peak gradient measures 6.3 mmHg. Aortic valve area, by VTI measures 4.38 cm. Pulmonic Valve: The pulmonic valve was normal in structure. Pulmonic valve regurgitation is not visualized. No evidence of pulmonic stenosis. Aorta: The aortic root is normal in size and structure. Venous: The inferior vena cava is normal in size with greater than 50% respiratory variability, suggesting right atrial pressure of 3 mmHg. IAS/Shunts: No atrial level shunt detected by color flow Doppler.  LEFT VENTRICLE PLAX 2D LVIDd:         3.55 cm  Diastology LVIDs:         2.23 cm  LV e' lateral:   16.00 cm/s LV PW:         1.03 cm  LV E/e' lateral: 6.4 LV IVS:        0.92 cm  LV e' medial:    17.60 cm/s LVOT diam:     2.20 cm  LV E/e' medial:  5.9 LV SV:         88 LV SV Index:   42 LVOT Area:     3.80 cm  RIGHT VENTRICLE RV S prime:     12.90 cm/s TAPSE (M-mode): 3.5 cm LEFT ATRIUM             Index       RIGHT ATRIUM          Index LA diam:        3.10 cm 1.49 cm/m  RA Area:      9.57 cm LA Vol (A2C):   41.0 ml 19.69 ml/m RA Volume:   17.30 ml 8.31 ml/m LA Vol (A4C):   38.3 ml 18.39 ml/m LA Biplane Vol: 41.2 ml 19.78 ml/m  AORTIC VALVE                   PULMONIC VALVE AV Area (Vmax):    3.21 cm    PV Vmax:        0.86 m/s AV Area (Vmean):   3.33 cm    PV Peak grad:   3.0 mmHg AV Area (VTI):     4.38 cm    RVOT Peak grad: 5 mmHg AV Vmax:           125.50 cm/s AV Vmean:          87.150 cm/s AV VTI:            0.202 m AV Peak Grad:      6.3 mmHg AV Mean Grad:      3.5 mmHg LVOT Vmax:  106.00 cm/s LVOT Vmean:        76.300 cm/s LVOT VTI:          0.232 m LVOT/AV VTI ratio: 1.15  AORTA Ao Root diam: 2.90 cm MITRAL VALVE                TRICUSPID VALVE MV Area (PHT): 5.75 cm     TR Peak grad:   6.7 mmHg MV Decel Time: 132 msec     TR Vmax:        129.00 cm/s MV E velocity: 103.00 cm/s MV A velocity: 35.40 cm/s   SHUNTS MV E/A ratio:  2.91         Systemic VTI:  0.23 m                             Systemic Diam: 2.20 cm Neoma Laming MD Electronically signed by Neoma Laming MD Signature Date/Time: 02/01/2020/12:06:14 PM    Final                LOS: 2 days   Melia Hopes  [facility]   Pager on [email or web address]. If 7PM-7AM, please contact night-coverage at [email or web address]     02/02/2020, 3:19 PM

## 2020-02-02 NOTE — Progress Notes (Signed)
Pt states she thinks she has broken needle in the antecubital of rt arm. Also, pt is requesting removing of IUD while inpatient if possible.

## 2020-02-02 NOTE — ED Notes (Signed)
Got up to toilet to void.  She is steady on feet and independent.

## 2020-02-02 NOTE — Progress Notes (Signed)
ID 34 y.o. female femalewith medical history significant forbipolar disorder,anxiety,history of IV druguse, shot up crystal meth and oral xanax and was as well as Xanax, running around the parking lot banging her head on things and appeared to be a danger to herself.  Patient was noted to be febrile in the ER with a temp of102 ,she was tachycardic with heart rate in the 140s and had leukocytosis with a left shift.  Pt has bad psoriasis She also complains of pain in the left iliac  area and hip area. 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 Patient had needle aspiration and drain placement. Blood and pelvic abscess grew MRSA. CT of the neck on 3/7/2021showed multiple peripheral predominant nodules at the lung apices suspicious for septic emboli and a right empyemawith inflammation and stranding on the right retroclavicular region and right lower neck consistent with soft tissue infection.IR performed US-guided thoracentesisandaspirated 14 mL of purulent material. Pleural culture grew MRSA. Patient was treated with IV vancomycin and the plan was for her to be discharged home on doxycycline to complete a 2-week course but she signed out of the hospital [facility]. She went to [facility] on 4/11 with abdominal pain and found to have biliary calculus, acute cholecystitis and had IR place cholecystostomy tube and treated with IV cephalosporins 4/11-4/19. plean was to leave the tube in place until 10/23/19 The Sacroiliitis  Right sacral ala & right ilium osteomyelitis  Infectious myositis:was treated with vanco and rifampin for 4 weeks until 5./11- not sure whether she stayed till 5/11- I see a note until 10/02/19  She was admitted to [facility] in July with abdominal pain  and a MRI done that admission showed Findings  consistent ongoing extensive septic arthritis and osteomyelitis of the right sacroiliac joint with a small joint effusion, however slightly improved in appearance from the prior Exam. 2. new small heterogeneous collection within the right piriformis measuring 3.0 x 1.1 x 2.4 cm, consistent with intramuscular abscess.3. Slightly improved appearance of the myositis involving the gluteal, piriformis, and iliopsoas musculature.ortho was consulted and they did not think it was necessary for any surgical intervention. She left AMA again. She denies any fever, chills, cough, sob before her crystal meth episode She had a CT abdomen/eplvis done on 01/31/20 and it showed IUD identified, demonstrating transmural penetration of both limbs through the fundal wall of the uterus, with the more inferior limb demonstrating slightly greater penetration than on the previous exam. 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   Patient Vitals for the past 24 hrs:  BP Temp Temp src Pulse Resp SpO2  02/02/20 1933 117/79 98.7 F (37.1 C) Oral 96 18 100 %  02/02/20 1607 (!) 119/92 98.6 F (37 C) Oral (!) 110 15 100 %  02/02/20 1250 116/89 98.1 F (36.7 C) Oral 94 16 100 %  02/02/20 1000 103/71 -- -- 92 -- --  02/02/20 0726 -- 98 F (36.7 C) Oral -- -- --  02/02/20 0700 103/71 -- -- 92 19 100 %  02/02/20 0630 106/74 -- -- 87 (!) 23 99 %  02/02/20 0600 105/78 -- -- 92 (!) 25 100 %  02/02/20 0521 107/66 -- -- (!) 114 (!) 24 98 %  02/02/20 0006 103/90 -- -- Marland Kitchen  109 (!) 26 100 %   O/e Awake , in some discomfort Facial psoriatic lesions- eye lids, eyebros Rt elbow Chest b/l air entry [id number] abd soft Both hip movt - flexion not complete CNS non focal Left arm Iv site swollen-  CBC Latest Ref Rng & Units 02/02/2020 02/01/2020 01/31/2020  WBC 4.0 - 10.5 K/uL 4.4 4.8 13.0(H)  Hemoglobin 12.0 - 15.0 g/dL 11.7(L) 12.2 15.1(H)   Hematocrit 36 - 46 % 35.1(L) 35.8(L) 43.3  Platelets 150 - 400 K/uL 202 201 352   CMP Latest Ref Rng & Units 02/02/2020 02/01/2020 01/31/2020  Glucose 70 - 99 mg/dL 114(H) 120(H) 124(H)  BUN 6 - 20 mg/dL <5(L) 8 19  Creatinine 0.44 - 1.00 mg/dL 0.40(L) 0.45 1.23(H)  Sodium 135 - 145 mmol/L 139 135 139  Potassium 3.5 - 5.1 mmol/L 3.4(L) 4.0 2.8(L)  Chloride 98 - 111 mmol/L 108 108 103  CO2 22 - 32 mmol/L 24 23 20(L)  Calcium 8.9 - 10.3 mg/dL 8.2(L) 8.2(L) 9.6  Total Protein 6.5 - 8.1 g/dL - - 9.6(H)  Total Bilirubin 0.3 - 1.2 mg/dL - - 1.3(H)  Alkaline Phos 38 - 126 U/L - - 172(H)  AST 15 - 41 U/L - - 35  ALT 0 - 44 U/L - - 28    Micro 9/1 BC 1 of 4 staph epidermidis  CT abdomen/pelvis- as above  Impression/recommendation Pt admitted after Crystal meth injection with abnormal behavior. One episode of fever and wbc could be related to that. No fever since 01/31/20 The staph epidermidis in 1 of 4 is a contaminant So the vancomycin can be discontinued and she observed off it  She had MRSA bacteremia  In March 2021 along with rt sacroilitis and rt psoas abscess and septic emboli to the lungs. 08/02/19 and 08/03/19 BC positive for MRSA- 08/05/19 neg She was at [facility] 3/4-09/03/19 and received IV vanco She then went to [facility] 4/11- 5/3 and received IV vanco and PO rifampin. Blood culture on 09/10/19 was neg So she has essentially been treated for the infection Psoriasis can also contribute to sacroilitis /  H/o gall stones, acute cholecystitis s/p cholecystostomy in April 2021  Malposition of IUD- would benefit from removal, recommend gyn consult Discussed  the management with patient and the hospitalist Informed her nurse regarding removing the IV line as it has infiltrated

## 2020-02-02 NOTE — Consult Note (Signed)
Pharmacy Antibiotic Note  Leslie Molina is a 34 y.o. female with history of polysubstance abuse admitted on 01/31/2020 with altered mental status / sepsis. Patient is febrile with leukocytosis, no left shift on differential. Procalcitonin < 0.10. Per chart, patient reported remote history of MRSA bone infection in March. Pharmacy has been consulted for vancomycin and cefepime dosing.  Pt received Vancomycin 2 g IV LD   Plan: Continue cefepime 2 g IV q8h  Continue vancomycin 750 mg IV q8h  Scr 1.23>0.45>0.40. Daily SCr per protocol.   Pharmacy will continue to follow and adjust antibiotics per renal function as indicated. Levels at steady state if prolonged duration of therapy anticipated.    Height: 5\' 6"  (167.6 cm) Weight: 99.8 kg (220 lb) IBW/kg (Calculated) : 59.3  Temp (24hrs), Avg:98.3 F (36.8 C), Min:98 F (36.7 C), Max:98.5 F (36.9 C)  Recent Labs  Lab 01/31/20 0918 01/31/20 1710 02/01/20 0411 02/02/20 0520  WBC 13.0*  --  4.8 4.4  CREATININE 1.23*  --  0.45 0.40*  LATICACIDVEN 2.3* 0.7  --   --     Estimated Creatinine Clearance: 117 mL/min (A) (by C-G formula based on SCr of 0.4 mg/dL (L)).    Not on File  Antimicrobials this admission: Metronidazole 9/1 >>  Cefepime 9/1 >>  Vancomycin 9/1 >>   Dose adjustments this admission: 9/2 Increased Vancomycin 750mg  q12 to 750mg  q8h based on improved renal function  Microbiology results: BCx Staph epi 1/4 bottles, BCID mecA/C not detected   Thank you for allowing pharmacy to be a part of this patients care.  Rocky Morel, PharmD, BCPS Clinical Pharmacist 02/02/2020 11:45 AM

## 2020-02-03 DIAGNOSIS — T8332XA Displacement of intrauterine contraceptive device, initial encounter: Secondary | ICD-10-CM

## 2020-02-03 LAB — CULTURE, BLOOD (ROUTINE X 2): Special Requests: ADEQUATE

## 2020-02-03 LAB — CREATININE, SERUM
Creatinine, Ser: 0.59 mg/dL (ref 0.44–1.00)
GFR calc Af Amer: >60 mL/min (ref >60)
GFR calc non Af Amer: >60 mL/min (ref >60)

## 2020-02-03 LAB — POTASSIUM: Potassium: 3.9 mmol/L (ref 3.5–5.1)

## 2020-02-03 MED ORDER — IBUPROFEN 600 MG PO TABS: 600.0000 mg | ORAL_TABLET | Freq: Three times a day (TID) | ORAL | Status: AC | PRN

## 2020-02-03 MED ORDER — DULOXETINE HCL 30 MG PO CPEP: 90.0000 mg | ORAL_CAPSULE | Freq: Every day | ORAL | 0 refills | Status: AC

## 2020-02-03 MED ORDER — MIRTAZAPINE 7.5 MG PO TABS: 7.5000 mg | ORAL_TABLET | Freq: Every day | ORAL | 0 refills | Status: AC

## 2020-02-03 MED ORDER — BACLOFEN 10 MG PO TABS: 10.0000 mg | ORAL_TABLET | Freq: Three times a day (TID) | ORAL | 0 refills | Status: AC | PRN

## 2020-02-03 MED ORDER — GABAPENTIN 400 MG PO CAPS: 1200.0000 mg | ORAL_CAPSULE | Freq: Three times a day (TID) | ORAL | 0 refills | Status: AC

## 2020-02-03 NOTE — Progress Notes (Signed)
Pt was stable. Pt was with Sitter the whole shift with no aggressive behavior. IVC was DC. DC instruction was given to pt. Sitter wheel the pt out, friend will drive the pt home.

## 2020-02-03 NOTE — TOC Progression Note (Signed)
Transition of Care [facility]) - Progression Note    Patient Details  Name: Leslie Molina MRN: [id number] Date of Birth: December 22, 1985  Transition of [facility] CM/SW Contact  Boris Sharper, LCSW Phone Number: 02/03/2020, 3:39 PM  Clinical Narrative:    [facility] consulted for Substance Abuse resources. CSW provided pt with resource list.         Expected Discharge Plan and Services           Expected Discharge Date: 02/03/20                                     Social Determinants of Health (SDOH) Interventions    Readmission Risk Interventions No flowsheet data found.

## 2020-02-03 NOTE — Consult Note (Signed)
Obstetrics & Gynecology Consult H&P   Consulting Department: Medicine  Consulting Physician: Jennye Boroughs, MD  Consulting Question: IUD perforation   History of Present Illness: Patient is a 34 y.o. currently admitted under IVC for altered mental status in setting of polysubstance abuse.  She had a preceding admission for MRSA bacteremia with multiple secondary site of infection with a 4 week course of vancomycin but signed out AMA that admission.  The patient has a history of prior Mirena IUD insertion in [place] in 01/31/2020.  Imaging this admission shows stable fundal perforation of the IUD with the body and at least one arm embedded in the myometrium and one arm in the though the uterine serosa into the peritoneal cavity.  This was evident on prior imaging studies as well.  The patient does report having intermittent menstrual cycles.  She has cramping with her menstrual cycles.  She is interested in preserving fertility, would like IUD removed.  Review of Systems:10 point review of systems  Past Medical History:  Patient Active Problem List   Diagnosis Date Noted  . AMS (altered mental status) 01/31/2020  . SIRS (systemic inflammatory response syndrome) ([place]) 01/31/2020  . Rhabdomyolysis 01/31/2020  . Hypokalemia 01/31/2020  . Substance abuse ([place]) 01/31/2020  . Sepsis ([place]) 01/31/2020  . 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 Benzodiazepine abuse ([place]) 01/31/2020    Past Surgical History:  History reviewed. No pertinent surgical history.  Gynecologic History:   Obstetric History: No obstetric history on file.  Family History:  Family History  Problem Relation Age of Onset  . Hypertension Mother   . Hypertension Father     Social History:  Social History   Socioeconomic History  . Marital status: Single    Spouse name: Not on file  . Number of children: Not on file  . Years of education: Not on file  . Highest education level: Not on file  Occupational History    . Not on file  Tobacco Use  . Smoking status: Current Every Day Smoker    Packs/day: 1.00    Types: Cigarettes  . Smokeless tobacco: Never Used  Substance and Sexual Activity  . Alcohol use: Yes  . Drug use: Yes    Types: Methamphetamines  . Sexual activity: Not on file  Other Topics Concern  . Not on file  Social History Narrative  . Not on file   Social Determinants of Health   Financial Resource Strain:   . Difficulty of Paying Living Expenses: Not on file  Food Insecurity:   . Worried About Charity fundraiser in the Last Year: Not on file  . Ran Out of Food in the Last Year: Not on file  Transportation Needs:   . Lack of Transportation (Medical): Not on file  . Lack of Transportation (Non-Medical): Not on file  Physical Activity:   . Days of Exercise per Week: Not on file  . Minutes of Exercise per Session: Not on file  Stress:   . Feeling of Stress : Not on file  Social Connections:   . Frequency of Communication with Friends and Family: Not on file  . Frequency of Social Gatherings with Friends and Family: Not on file  . Attends Religious Services: Not on file  . Active Member of Clubs or Organizations: Not on file  . Attends Archivist Meetings: Not on file  . Marital Status: Not on file  Intimate Partner Violence:   . Fear of Current or Ex-Partner: Not  on file  . Emotionally Abused: Not on file  . Physically Abused: Not on file  . Sexually Abused: Not on file    Allergies:  Not on File  Medications: Prior to Admission medications   Medication Sig Start Date End Date Taking? Authorizing Provider  baclofen (LIORESAL) 10 MG tablet Take 10 mg by mouth 3 (three) times daily as needed for muscle spasms. 11/07/19  Yes [provider]  hydrOXYzine (ATARAX/VISTARIL) 25 MG tablet Take 50 mg by mouth every 6 (six) hours as needed for anxiety. 11/07/19  Yes [provider]  DULoxetine (CYMBALTA) 30 MG capsule Take 90 mg by mouth daily.  11/07/19   [provider]  gabapentin (NEURONTIN) 400 MG capsule Take 1,200 mg by mouth 3 (three) times daily. 11/07/19   [provider]  mirtazapine (REMERON) 7.5 MG tablet Take 7.5 mg by mouth at bedtime. 11/07/19   [provider]  SUBOXONE 8-2 MG FILM Place 1 Film under the tongue 3 (three) times daily. 11/22/19   [provider]    Physical Exam Vitals: Blood pressure 123/82, pulse 92, temperature 98.1 F (36.7 C), temperature source Oral, resp. rate 16, height 5\' 6"  (1.676 m), weight 99.8 kg, SpO2 93 %. General: NAD HEENT: normocephalic, anicteric Pulmonary: No increased work of breathing Cardiovascular: RRR, distal pulses 2+ Abdomen: soft, non-tender, non-distended, prior laparoscopic cholecystectomy scar Genitourinary: deferred Extremities: no edema, erythema, or tenderness Neurologic: Grossly intact Psychiatric: mood appropriate, affect full  Labs: Results for orders placed or performed during the hospital encounter of 01/31/20 (from the past 72 hour(s))  HIV Antibody (routine testing w rflx)     Status: None   Collection Time: 01/31/20  5:08 PM  Result Value Ref Range   HIV Screen 4th Generation wRfx Non Reactive Non Reactive    Comment: Performed at [facility], [place] [address] [place], [place] [address]  Magnesium     Status: None   Collection Time: 01/31/20  5:08 PM  Result Value Ref Range   Magnesium 2.0 1.7 - 2.4 mg/dL    Comment: Performed at [facility], [address] [place], [place] [address]  Phosphorus     Status: None   Collection Time: 01/31/20  5:08 PM  Result Value Ref Range   Phosphorus 2.6 2.5 - 4.6 mg/dL    Comment: Performed at [facility], [address] [place], [place] [address]  Lactic acid, plasma     Status: None   Collection Time: 01/31/20  5:10 PM  Result Value Ref Range   Lactic Acid, Venous 0.7 0.5 - 1.9 mmol/L    Comment: Performed at [facility], [address] [place], [place] [address]  Protime-INR     Status: None   Collection Time: 02/01/20  4:11 AM  Result Value Ref Range   Prothrombin Time 14.1 11.4 - 15.2 seconds   INR 1.1 0.8 - 1.2    Comment: (NOTE) INR goal varies based on device and disease states. Performed at [facility], [address] [place], [place] [address]   Cortisol-am, blood     Status: None   Collection Time: 02/01/20  4:11 AM  Result Value Ref Range   Cortisol - AM 9.5 6.7 - 22.6 ug/dL    Comment: Performed at [facility] [address] [place],  [address]  Procalcitonin     Status: None   Collection Time: 02/01/20  4:11 AM  Result Value Ref Range  Procalcitonin <0.10 ng/mL    Comment:        Interpretation: PCT (Procalcitonin) <= 0.5 ng/mL: Systemic infection (sepsis) is not likely. Local bacterial infection is possible. (NOTE)       Sepsis PCT Algorithm           Lower Respiratory Tract                                      Infection PCT Algorithm    ----------------------------     ----------------------------         PCT < 0.25 ng/mL                PCT < 0.10 ng/mL          Strongly encourage             Strongly discourage   discontinuation of antibiotics    initiation of antibiotics    ----------------------------     -----------------------------       PCT 0.25 - 0.50 ng/mL            PCT 0.10 - 0.25 ng/mL               OR       >80% decrease in PCT            Discourage initiation of                                            antibiotics      Encourage discontinuation           of antibiotics    ----------------------------     -----------------------------         PCT >= 0.50 ng/mL              PCT 0.26 - 0.50 ng/mL               AND        <80% decrease in PCT             Encourage initiation of                                             antibiotics       Encourage continuation           of antibiotics    ----------------------------      -----------------------------        PCT >= 0.50 ng/mL                  PCT > 0.50 ng/mL               AND         increase in PCT                  Strongly encourage                                      initiation of antibiotics    Strongly encourage escalation           of antibiotics                                     -----------------------------  PCT <= 0.25 ng/mL                                                 OR                                        > 80% decrease in PCT                                      Discontinue / Do not initiate                                             antibiotics  Performed at [facility], [address] [place], [place] [address]   Basic metabolic panel     Status: Abnormal   Collection Time: 02/01/20  4:11 AM  Result Value Ref Range   Sodium 135 135 - 145 mmol/L   Potassium 4.0 3.5 - 5.1 mmol/L   Chloride 108 98 - 111 mmol/L   CO2 23 22 - 32 mmol/L   Glucose, Bld 120 (H) 70 - 99 mg/dL    Comment: Glucose reference range applies only to samples taken after fasting for at least 8 hours.   BUN 8 6 - 20 mg/dL   Creatinine, Ser 0.45 0.44 - 1.00 mg/dL   Calcium 8.2 (L) 8.9 - 10.3 mg/dL   GFR calc non Af Amer >60 >60 mL/min   GFR calc Af Amer >60 >60 mL/min   Anion gap 4 (L) 5 - 15    Comment: Performed at [facility], [address] [place], [place] [address]  CBC     Status: Abnormal   Collection Time: 02/01/20  4:11 AM  Result Value Ref Range   WBC 4.8 4.0 - 10.5 K/uL   RBC 4.45 3.87 - 5.11 MIL/uL   Hemoglobin 12.2 12.0 - 15.0 g/dL   HCT 35.8 (L) 36 - 46 %   MCV 80.4 80.0 - 100.0 fL   MCH 27.4 26.0 - 34.0 pg   MCHC 34.1 30.0 - 36.0 g/dL   RDW 16.4 (H) 11.5 - 15.5 %   Platelets 201 150 - 400 K/uL   nRBC 0.0 0.0 - 0.2 %    Comment: Performed at [facility], [address] [place], [place] [address]  CK     Status: Abnormal   Collection Time: 02/01/20  4:11 AM   Result Value Ref Range   Total CK 464 (H) 38.0 - 234.0 U/L    Comment: Performed at [facility], [address] [place], [place] [address]  Basic metabolic panel     Status: Abnormal   Collection Time: 02/02/20  5:20 AM  Result Value Ref Range   Sodium 139 135 - 145 mmol/L   Potassium 3.4 (L) 3.5 - 5.1 mmol/L   Chloride 108 98 - 111 mmol/L   CO2 24 22 - 32 mmol/L   Glucose, Bld 114 (H) 70 - 99 mg/dL    Comment: Glucose reference range applies only to samples taken after fasting for  at least 8 hours.   BUN <5 (L) 6 - 20 mg/dL   Creatinine, Ser 0.40 (L) 0.44 - 1.00 mg/dL   Calcium 8.2 (L) 8.9 - 10.3 mg/dL   GFR calc non Af Amer >60 >60 mL/min   GFR calc Af Amer >60 >60 mL/min   Anion gap 7 5 - 15    Comment: Performed at [facility], [address] [place], [place] [address]  CBC with Differential/Platelet     Status: Abnormal   Collection Time: 02/02/20  5:20 AM  Result Value Ref Range   WBC 4.4 4.0 - 10.5 K/uL   RBC 4.30 3.87 - 5.11 MIL/uL   Hemoglobin 11.7 (L) 12.0 - 15.0 g/dL   HCT 35.1 (L) 36 - 46 %   MCV 81.6 80.0 - 100.0 fL   MCH 27.2 26.0 - 34.0 pg   MCHC 33.3 30.0 - 36.0 g/dL   RDW 17.0 (H) 11.5 - 15.5 %   Platelets 202 150 - 400 K/uL   nRBC 0.0 0.0 - 0.2 %   Neutrophils Relative % 59 %   Neutro Abs 2.6 1.7 - 7.7 K/uL   Lymphocytes Relative 26 %   Lymphs Abs 1.1 0.7 - 4.0 K/uL   Monocytes Relative 10 %   Monocytes Absolute 0.4 0 - 1 K/uL   Eosinophils Relative 4 %   Eosinophils Absolute 0.2 0 - 0 K/uL   Basophils Relative 1 %   Basophils Absolute 0.0 0 - 0 K/uL   Immature Granulocytes 0 %   Abs Immature Granulocytes 0.01 0.00 - 0.07 K/uL    Comment: Performed at [facility], [address], [place], [place] [address]  Magnesium     Status: None   Collection Time: 02/02/20  5:20 AM  Result Value Ref Range   Magnesium 1.8 1.7 - 2.4 mg/dL    Comment: Performed at [facility], [address] [place], [place]  [address]  Creatinine, serum     Status: None   Collection Time: 02/03/20  5:14 AM  Result Value Ref Range   Creatinine, Ser 0.59 0.44 - 1.00 mg/dL   GFR calc non Af Amer >60 >60 mL/min   GFR calc Af Amer >60 >60 mL/min    Comment: Performed at [facility], [address], [place], [place] [address]  Potassium     Status: None   Collection Time: 02/03/20  5:14 AM  Result Value Ref Range   Potassium 3.9 3.5 - 5.1 mmol/L    Comment: Performed at [facility], [address], [place], [place] [address]    Imaging DG Chest 2 View  Result Date: 01/31/2020 CLINICAL DATA:  Evaluate source of fevers. EXAM: CHEST - 2 VIEW COMPARISON:  Earlier today FINDINGS: The heart size and mediastinal contours are within normal limits. Both lungs are clear. The visualized skeletal structures are unremarkable. IMPRESSION: No active cardiopulmonary disease. Electronically Signed   By: Kerby Moors M.D.   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 Sagittal and coronal MPR images reconstructed from axial data set. CONTRAST:  182mL OMNIPAQUE IOHEXOL 350 MG/ML SOLN IV. Dilute oral contrast. COMPARISON:  12/05/2019 Correlation: CT RIGHT hip 09/02/2019 FINDINGS: Lower chest: Lung bases clear Hepatobiliary:  Gallbladder surgically absent. Liver normal appearance Pancreas: Normal appearance Spleen: Normal appearance Adrenals/Urinary Tract: Adrenal glands, kidneys, ureters, and bladder normal appearance Stomach/Bowel: Normal appendix. Stomach and bowel loops normal appearance. Vascular/Lymphatic: Vascular structures patent.  No adenopathy. Reproductive: Normal size and morphology of uterus and ovaries. IUD  identified, demonstrating transmural penetration of both limbs through the fundal wall of the uterus, with the more inferior limb demonstrating slightly greater penetration than on the previous exam Other: No free air or free fluid. No hernia or inflammatory process. Thickening at umbilicus versus prior exam likely reflects interval in bili cul hernia repair. 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 IMPRESSION: No acute intra-abdominal or intrapelvic abnormalities. IUD identified, demonstrating transmural penetration of both limbs through the fundal wall of the uterus, with the more inferior limb demonstrating slightly greater penetration than on the previous exam. 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 Electronically Signed   By: Lavonia Dana M.D.   On: 01/31/2020 14:40   DG Chest Port 1 View  Result Date: 01/31/2020 CLINICAL DATA:  Questionable sepsis EXAM: PORTABLE CHEST 1 VIEW COMPARISON:  08/09/2019 FINDINGS: Low volume AP portable examination with subtle bibasilar heterogeneous and interstitial airspace opacity. The heart and mediastinum are unremarkable. The bones and upper abdomen are unremarkable. IMPRESSION: Low volume AP portable examination with subtle bibasilar heterogeneous and interstitial airspace opacity, concerning for infection, although this appearance may be exaggerated by low volume technique and partial atelectasis. Consider PA and lateral radiographs to further evaluate. Electronically Signed   By: Eddie Candle M.D.   On: 01/31/2020 10:39   ECHOCARDIOGRAM COMPLETE  Result Date: 02/01/2020    ECHOCARDIOGRAM REPORT   Patient Name:  Leslie Molina Date of      02/01/2020                           Exam: Medical Rec #: [id number]  Height:      66.0 in Accession #:   [id number] Weight:      220.0 lb Date  of Birth: 01-07-1986  BSA:         2.082 m Patient Age:   25 years   BP:          Not listed in chart/Not listed in chart                                        mmHg Patient        F          HR:          115 bpm. Gender: Exam Location: [facility] Procedure: 2D Echo, Cardiac Doppler and Color Doppler Indications:     Bacteremia 790.7  History:         Patient has no prior history of Echocardiogram examinations.                  Anxiety, bipolar disorder.  Sonographer:     Sherrie Sport RDCS (AE) Referring Phys:  [place] Diagnosing Phys: Neoma Laming MD IMPRESSIONS  1. Left ventricular ejection fraction, by estimation, is 60 to 65%. The left ventricle has normal function. The left ventricle has no regional wall motion abnormalities. Left ventricular diastolic  parameters were normal.  2. Right ventricular systolic function is normal. The right ventricular size is normal. There is normal pulmonary artery systolic pressure.  3. The mitral valve is normal in structure. No evidence of mitral valve regurgitation. No evidence of mitral stenosis.  4. The aortic valve is normal in structure. Aortic valve regurgitation is not visualized. No aortic stenosis is present.  5. The inferior vena cava is normal in size with greater than 50% respiratory variability, suggesting right atrial pressure of 3 mmHg. Conclusion(s)/Recommendation(s): No evidence of valvular vegetations on this transthoracic echocardiogram. Would recommend a transesophageal echocardiogram to exclude infective endocarditis if clinically indicated. FINDINGS  Left Ventricle: Left ventricular ejection fraction, by estimation, is 60 to 65%. The left ventricle has normal function. The left ventricle has no regional wall motion abnormalities. The left ventricular internal cavity size was normal in size. There is  no left ventricular hypertrophy. Left ventricular diastolic parameters were normal. Right Ventricle: The right ventricular size is normal. No increase in  right ventricular wall thickness. Right ventricular systolic function is normal. There is normal pulmonary artery systolic pressure. The tricuspid regurgitant velocity is 1.29 m/s, and  with an assumed right atrial pressure of 10 mmHg, the estimated right ventricular systolic pressure is 13.0 mmHg. Left Atrium: Left atrial size was normal in size. Right Atrium: Right atrial size was normal in size. Pericardium: There is no evidence of pericardial effusion. Mitral Valve: The mitral valve is normal in structure. Normal mobility of the mitral valve leaflets. No evidence of mitral valve regurgitation. No evidence of mitral valve stenosis. Tricuspid Valve: The tricuspid valve is normal in structure. Tricuspid valve regurgitation is not demonstrated. No evidence of tricuspid stenosis. Aortic Valve: The aortic valve is normal in structure. Aortic valve regurgitation is not visualized. No aortic stenosis is present. Aortic valve mean gradient measures 3.5 mmHg. Aortic valve peak gradient measures 6.3 mmHg. Aortic valve area, by VTI measures 4.38 cm. Pulmonic Valve: The pulmonic valve was normal in structure. Pulmonic valve regurgitation is not visualized. No evidence of pulmonic stenosis. Aorta: The aortic root is normal in size and structure. Venous: The inferior vena cava is normal in size with greater than 50% respiratory variability, suggesting right atrial pressure of 3 mmHg. IAS/Shunts: No atrial level shunt detected by color flow Doppler.  LEFT VENTRICLE PLAX 2D LVIDd:         3.55 cm  Diastology LVIDs:         2.23 cm  LV e' lateral:   16.00 cm/s LV PW:         1.03 cm  LV E/e' lateral: 6.4 LV IVS:        0.92 cm  LV e' medial:    17.60 cm/s LVOT diam:     2.20 cm  LV E/e' medial:  5.9 LV SV:         88 LV SV Index:   42 LVOT Area:     3.80 cm  RIGHT VENTRICLE RV S prime:     12.90 cm/s TAPSE (M-mode): 3.5 cm LEFT ATRIUM             Index       RIGHT ATRIUM          Index LA diam:        3.10 cm 1.49 cm/m  RA  Area:     9.57 cm LA Vol (A2C):   41.0 ml 19.69 ml/m RA Volume:   17.30 ml 8.31 ml/m LA Vol (A4C):  38.3 ml 18.39 ml/m LA Biplane Vol: 41.2 ml 19.78 ml/m  AORTIC VALVE                   PULMONIC VALVE AV Area (Vmax):    3.21 cm    PV Vmax:        0.86 m/s AV Area (Vmean):   3.33 cm    PV Peak grad:   3.0 mmHg AV Area (VTI):     4.38 cm    RVOT Peak grad: 5 mmHg AV Vmax:           125.50 cm/s AV Vmean:          87.150 cm/s AV VTI:            0.202 m AV Peak Grad:      6.3 mmHg AV Mean Grad:      3.5 mmHg LVOT Vmax:         106.00 cm/s LVOT Vmean:        76.300 cm/s LVOT VTI:          0.232 m LVOT/AV VTI ratio: 1.15  AORTA Ao Root diam: 2.90 cm MITRAL VALVE                TRICUSPID VALVE MV Area (PHT): 5.75 cm     TR Peak grad:   6.7 mmHg MV Decel Time: 132 msec     TR Vmax:        129.00 cm/s MV E velocity: 103.00 cm/s MV A velocity: 35.40 cm/s   SHUNTS MV E/A ratio:  2.91         Systemic VTI:  0.23 m                             Systemic Diam: 2.20 cm Neoma Laming MD Electronically signed by Neoma Laming MD Signature Date/Time: 02/01/2020/12:06:14 PM    Final     Assessment: 34 y.o. with IUD perforation of the uterus  Plan:  1) IUD perforation - chronic.  We discussed options for removal which will likely require a hysteroscopic as well as laparoscopic approach.  In addition her history of multiple prior abscess may further complicate the surgery.  If inability to remove the IUD or significant bleeding encountered she may required hysterectomy.  Given the chronicity of the finding, as well complexity of the case we discussed that this would not be done during this admission but set up on an outpatient basis.  I discussed that the case would be considered elective and require her to be abstain from cocaine use in order to safely administer anesthesia. Will have the patient follow up in 1 week for preop visit.  Patient states her best contact number is her daughter cell phone number [phone]   Malachy Mood, MD, [place], [facility] 02/03/2020, 11:27 AM

## 2020-02-03 NOTE — Consult Note (Signed)
Pharmacy Antibiotic Note  Leslie Molina is a 34 y.o. female with history of polysubstance abuse admitted on 01/31/2020 with altered mental status / sepsis. Remote history of MRSA bone infection in March. Pharmacy has been consulted for vancomycin and cefepime dosing.  Today, patient afebrile on APAP. Last WBC WNL, renal function stable. 9/1 blood culture with 1/4 bottles growing staph epidermidis, likely contaminant.   All antibiotics have been discontinued by primary MD and ID physician to observe patient clinical progress of antibiotics.  Plan: Pharmacy will discontinue rx consults as all antibiotics discontinued. Will continue to follow patient clinical progress. Please re-consult pharmacy for antibiotic dosing if needed   Height: 5\' 6"  (167.6 cm) Weight: 99.8 kg (220 lb) IBW/kg (Calculated) : 59.3  Temp (24hrs), Avg:98.4 F (36.9 C), Min:98.1 F (36.7 C), Max:98.7 F (37.1 C)  Recent Labs  Lab 01/31/20 0918 01/31/20 1710 02/01/20 0411 02/02/20 0520 02/03/20 0514  WBC 13.0*  --  4.8 4.4  --   CREATININE 1.23*  --  0.45 0.40* 0.59  LATICACIDVEN 2.3* 0.7  --   --   --     Estimated Creatinine Clearance: 117 mL/min (by C-G formula based on SCr of 0.59 mg/dL).    Not on File  Antimicrobials this admission: Metronidazole 9/1 >> 9/3 Cefepime 9/1 >> 9/3 Vancomycin 9/1 >> 9/3  Dose adjustments this admission: 9/2 Increased Vancomycin 750mg  q12 to 750mg  q8h based on improved renal function  Microbiology results: BCx Staph epi 1/4 bottles, BCID mecA/C not detected   Thank you for allowing pharmacy to be a part of this patient's care.  Mills Koller, PharmD Clinical Pharmacist 02/03/2020 8:24 AM

## 2020-02-03 NOTE — Plan of Care (Signed)

## 2020-02-03 NOTE — Discharge Summary (Addendum)
Physician Discharge Summary  Leslie Molina SEG:[id number] DOB: 09-Feb-1986 DOA: 01/31/2020  PCP: Patient, No Pcp Per  Admit date: 01/31/2020 Discharge date: 02/03/2020  Discharge disposition: Home   Recommendations for Outpatient Follow-Up:   Follow up with outpatient substance abuse program  Follow-up with Dr. 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  Diet recommendation:  Diet Order            Diet general           Diet regular Room service appropriate? Yes; Fluid consistency: Thin  Diet effective now                   Code Status: Full Code     Hospital Course:    Leslie Molina is a 34 year old woman with medical history significant for morbid obesity, substance abuse (IV amphetamine, benzodiazepine abuse, tobacco abuse), anxiety, depression, medical nonadherence, history of septic shock secondary to MRSA hip infection in March 2021.  She was brought to the hospital because of altered mental status (acute toxic encephalopathy).  Reportedly, patient was found running around the parking lot and banging her head on objects.  She appeared to be a danger to herself so she was brought to the emergency room for further evaluation.  She was placed under IVC.  She was febrile and tachycardic and had some leukocytosis.  Initially, there was concern for sepsis so she was treated with empiric IV antibiotics and IV fluids.  1 out of 4 blood culture bottles grew staph epidermidis.  ID specialist was consulted to assist with management.  It was presumed that staph epidermidis in blood was a contaminant so ID recommended  discontinuation of oral antibiotics.  She has not had any fever for more than 48 hours and leukocytosis are resolved.  She likely had SIRS secondary to acute IV amphetamine use.  Sepsis was ruled out.  Patient was found to have chronic IUD perforation/IUD malposition.  She was evaluated by the gynecologist, Dr. Georgianne Fick, who recommended outpatient follow-up for removal.  She was also evaluated by the psychiatrist.  She was deemed not to be suicidal, homicidal or psychotic.  Case was discussed with psychiatrist, Dr. Danella Maiers and Waylan Boga, NP with psychiatry.  Dr. Danella Maiers said that patient is okay for discharge from psychiatry standpoint because she does not need inpatient management at the behavioral health unit and IVC can be revoked.  Patient was provided with outpatient resources for follow-up.  All her prescriptions have been refilled per her request (except Ativan and Suboxone).   Medical Consultants:    Psychiatry  Infectious disease  Gynecologist   Discharge Exam:    Vitals:   02/02/20 1607 02/02/20 1933 02/03/20 0014 02/03/20 0343  BP: (!) 119/92 117/79 137/86 123/82  Pulse: (!) 110 96 94 92  Resp: 15 18 16 16   Temp: 98.6 F (37 C) 98.7 F (37.1 C) 98.3 F (36.8 C) 98.1 F (36.7 C)  TempSrc: Oral Oral Oral Oral  SpO2: 100% 100% 98% 93%  Weight:      Height:         GEN: NAD SKIN: Multiple bruises on face and upper extremities.  EYES: EOMI ENT: MMM CV: RRR PULM: CTA B ABD: soft, ND, NT, +BS CNS: AAO x 3, non focal EXT: Mild swelling and tenderness of dorsal aspect of left hand (she had a peripheral IV cannula on the left hand that had been removed).  Small area of induration at the right antecubital area from repeated IV injections.  No swelling, tenderness or erythema noted around,above or below this area.   The results of significant diagnostics from this hospitalization (including imaging, microbiology, ancillary and laboratory) are listed below  for reference.     Procedures and Diagnostic Studies:   ECHOCARDIOGRAM COMPLETE  Result Date: 02/01/2020    ECHOCARDIOGRAM REPORT   Patient Name:  Leslie Molina Date of      02/01/2020                           Exam: Medical Rec #: [id number]  Height:      66.0 in Accession #:   [id number] Weight:      220.0 lb Date of Birth: 1985-07-28  BSA:         2.082 m Patient Age:   66 years   BP:          Not listed in chart/Not listed in chart                                        mmHg Patient        F          HR:          115 bpm. Gender: Exam Location: [facility] Procedure: 2D Echo, Cardiac Doppler and Color Doppler Indications:     Bacteremia 790.7  History:         Patient has no prior history of Echocardiogram examinations.                  Anxiety, bipolar disorder.  Sonographer:     Sherrie Sport RDCS (AE) Referring Phys:  [place] Diagnosing Phys: Neoma Laming MD IMPRESSIONS  1. Left ventricular ejection fraction, by estimation, is 60 to 65%. The left ventricle has normal function. The left ventricle has no regional wall motion abnormalities. Left ventricular diastolic parameters were normal.  2. Right ventricular systolic function is normal. The right ventricular size is normal. There is normal pulmonary artery systolic pressure.  3. The mitral valve is normal in structure. No evidence of mitral valve regurgitation. No evidence of mitral stenosis.  4. The aortic valve is normal in structure. Aortic valve regurgitation is not visualized. No aortic stenosis is present.  5. The inferior vena cava is normal in size with greater than 50% respiratory variability, suggesting right atrial pressure of 3 mmHg. Conclusion(s)/Recommendation(s): No evidence of valvular vegetations on this transthoracic echocardiogram. Would recommend a transesophageal echocardiogram to exclude infective endocarditis if clinically indicated. FINDINGS  Left Ventricle: Left ventricular ejection fraction, by estimation, is 60 to 65%. The left  ventricle has normal function. The left ventricle has no regional wall motion abnormalities. The left ventricular internal cavity size was normal in size. There is  no left ventricular hypertrophy. Left ventricular diastolic parameters were normal. Right Ventricle: The right ventricular size is normal. No increase in right ventricular wall thickness. Right ventricular systolic function is normal. There is normal pulmonary artery systolic pressure. The tricuspid regurgitant velocity is 1.29 m/s, and  with an assumed right atrial pressure of 10 mmHg, the estimated right ventricular systolic pressure is 08.1 mmHg. Left Atrium: Left atrial size was normal in size. Right Atrium: Right atrial size was normal in size. Pericardium: There is no evidence of pericardial effusion. Mitral Valve: The mitral valve is normal in structure. Normal mobility of the mitral valve leaflets. No evidence of mitral valve regurgitation. No evidence of mitral valve stenosis. Tricuspid Valve: The tricuspid valve is normal in structure. Tricuspid valve regurgitation is not demonstrated. No evidence of tricuspid stenosis. Aortic Valve: The aortic valve is normal in structure. Aortic valve regurgitation is not visualized. No aortic stenosis is present. Aortic valve mean gradient measures 3.5 mmHg. Aortic valve peak gradient measures 6.3 mmHg. Aortic valve area, by VTI measures 4.38 cm. Pulmonic Valve: The pulmonic valve was normal in structure. Pulmonic valve regurgitation is not visualized. No evidence of pulmonic stenosis. Aorta: The aortic root is normal in size and structure. Venous: The inferior vena cava is normal in size with greater than 50% respiratory variability, suggesting right atrial pressure of 3 mmHg. IAS/Shunts: No atrial level shunt detected by color flow Doppler.  LEFT VENTRICLE PLAX 2D LVIDd:         3.55 cm  Diastology LVIDs:         2.23 cm  LV e' lateral:   16.00 cm/s LV PW:         1.03 cm  LV E/e' lateral: 6.4 LV IVS:         0.92 cm  LV e' medial:    17.60 cm/s LVOT diam:     2.20 cm  LV E/e' medial:  5.9 LV SV:         88 LV SV Index:   42 LVOT Area:     3.80 cm  RIGHT VENTRICLE RV S prime:     12.90 cm/s TAPSE (M-mode): 3.5 cm LEFT ATRIUM             Index       RIGHT ATRIUM          Index LA diam:        3.10 cm 1.49 cm/m  RA Area:     9.57 cm LA Vol (A2C):   41.0 ml 19.69 ml/m RA Volume:   17.30 ml 8.31 ml/m LA Vol (A4C):   38.3 ml 18.39 ml/m LA Biplane Vol: 41.2 ml 19.78 ml/m  AORTIC VALVE                   PULMONIC VALVE AV Area (Vmax):    3.21 cm    PV Vmax:        0.86 m/s AV Area (Vmean):   3.33 cm    PV Peak grad:   3.0 mmHg AV Area (VTI):     4.38 cm    RVOT Peak grad: 5 mmHg AV Vmax:           125.50 cm/s AV Vmean:          87.150 cm/s AV VTI:            0.202 m AV Peak Grad:      6.3 mmHg AV Mean Grad:      3.5 mmHg LVOT Vmax:         106.00 cm/s LVOT Vmean:        76.300 cm/s LVOT VTI:          0.232 m LVOT/AV VTI ratio: 1.15  AORTA Ao Root diam:  2.90 cm MITRAL VALVE                TRICUSPID VALVE MV Area (PHT): 5.75 cm     TR Peak grad:   6.7 mmHg MV Decel Time: 132 msec     TR Vmax:        129.00 cm/s MV E velocity: 103.00 cm/s MV A velocity: 35.40 cm/s   SHUNTS MV E/A ratio:  2.91         Systemic VTI:  0.23 m                             Systemic Diam: 2.20 cm Neoma Laming MD Electronically signed by Neoma Laming MD Signature Date/Time: 02/01/2020/12:06:14 PM    Final      Labs:   Basic Metabolic Panel: Recent Labs  Lab 01/31/20 1829 01/31/20 9371 01/31/20 1708 02/01/20 0411 02/01/20 0411 02/02/20 0520 02/03/20 0514  NA 139  --   --  135  --  139  --   K 2.8*   < >  --  4.0   < > 3.4* 3.9  CL 103  --   --  108  --  108  --   CO2 20*  --   --  23  --  24  --   GLUCOSE 124*  --   --  120*  --  114*  --   BUN 19  --   --  8  --  <5*  --   CREATININE 1.23*  --   --  0.45  --  0.40* 0.59  CALCIUM 9.6  --   --  8.2*  --  8.2*  --   MG  --   --  2.0  --   --  1.8  --   PHOS  --   --  2.6   --   --   --   --    < > = values in this interval not displayed.   GFR Estimated Creatinine Clearance: 117 mL/min (by C-G formula based on SCr of 0.59 mg/dL). Liver Function Tests: Recent Labs  Lab 01/31/20 0918  AST 35  ALT 28  ALKPHOS 172*  BILITOT 1.3*  PROT 9.6*  ALBUMIN 5.3*   No results for input(s): LIPASE, AMYLASE in the last 168 hours. No results for input(s): AMMONIA in the last 168 hours. Coagulation profile Recent Labs  Lab 01/31/20 0918 02/01/20 0411  INR 1.2 1.1    CBC: Recent Labs  Lab 01/31/20 0918 02/01/20 0411 02/02/20 0520  WBC 13.0* 4.8 4.4  NEUTROABS 11.2*  --  2.6  HGB 15.1* 12.2 11.7*  HCT 43.3 35.8* 35.1*  MCV 76.1* 80.4 81.6  PLT 352 201 202   Cardiac Enzymes: Recent Labs  Lab 01/31/20 0918 02/01/20 0411  CKTOTAL 604* 464*   BNP: Invalid input(s): POCBNP CBG: No results for input(s): GLUCAP in the last 168 hours. D-Dimer No results for input(s): DDIMER in the last 72 hours. Hgb A1c No results for input(s): HGBA1C in the last 72 hours. Lipid Profile No results for input(s): CHOL, HDL, LDLCALC, TRIG, CHOLHDL, LDLDIRECT in the last 72 hours. Thyroid function studies No results for input(s): TSH, T4TOTAL, T3FREE, THYROIDAB in the last 72 hours.  Invalid input(s): FREET3 Anemia work up No results for input(s): VITAMINB12, FOLATE, FERRITIN, TIBC, IRON, RETICCTPCT in the last 72 hours. Microbiology Recent Results (from the past 240 hour(s))  SARS  Coronavirus 2 by RT PCR (hospital order, performed in [facility] hospital lab) Nasopharyngeal Nasopharyngeal Swab     Status: None   Collection Time: 01/31/20  9:18 AM   Specimen: Nasopharyngeal Swab  Result Value Ref Range Status   SARS Coronavirus 2 NEGATIVE NEGATIVE Final    Comment: (NOTE) SARS-CoV-2 target nucleic acids are NOT DETECTED.  The SARS-CoV-2 RNA is generally detectable in upper and lower respiratory specimens during the acute phase of infection. The  lowest concentration of SARS-CoV-2 viral copies this assay can detect is 250 copies / mL. A negative result does not preclude SARS-CoV-2 infection and should not be used as the sole basis for treatment or other patient management decisions.  A negative result may occur with improper specimen collection / handling, submission of specimen other than nasopharyngeal swab, presence of viral mutation(s) within the areas targeted by this assay, and inadequate number of viral copies (<250 copies / mL). A negative result must be combined with clinical observations, patient history, and epidemiological information.  Fact Sheet for Patients:   StrictlyIdeas.no  Fact Sheet for Healthcare Providers: BankingDealers.co.za  This test is not yet approved or  cleared by the [place] FDA and has been authorized for detection and/or diagnosis of SARS-CoV-2 by FDA under an Emergency Use Authorization (EUA).  This EUA will remain in effect (meaning this test can be used) for the duration of the COVID-19 declaration under Section 564(b)(1) of the Act, 21 U.S.C. section 360bbb-3(b)(1), unless the authorization is terminated or revoked sooner.  Performed at [address] [facility], [address] [place], [place] [address]   Blood Culture (routine x 2)     Status: None (Preliminary result)   Collection Time: 01/31/20  9:18 AM   Specimen: BLOOD  Result Value Ref Range Status   Specimen Description BLOOD RIGHT ASSIST CONTROL  Final   Special Requests   Final    BOTTLES DRAWN AEROBIC AND ANAEROBIC Blood Culture results may not be optimal due to an excessive volume of blood received in culture bottles   Culture   Final    NO GROWTH 3 DAYS Performed at [facility], [address], [place], [place] [address]    Report Status PENDING  Incomplete  Blood Culture (routine x 2)     Status: Abnormal   Collection Time: 01/31/20  9:19 AM   Specimen:  BLOOD  Result Value Ref Range Status   Specimen Description   Final    BLOOD LEFT HAND Performed at [facility], [address], [place], [place] [address]    Special Requests   Final    BOTTLES DRAWN AEROBIC AND ANAEROBIC Blood Culture adequate volume Performed at [facility], [address], [place], [place] [address]    Culture  Setup Time   Final    GRAM POSITIVE COCCI ANAEROBIC BOTTLE ONLY CRITICAL RESULT CALLED TO, READ BACK BY AND VERIFIED WITH: SCOTT HALL PHARMD [id number] 02/01/20 HNM    Culture (A)  Final    STAPHYLOCOCCUS EPIDERMIDIS THE SIGNIFICANCE OF ISOLATING THIS ORGANISM FROM A SINGLE SET OF BLOOD CULTURES WHEN MULTIPLE SETS ARE DRAWN IS UNCERTAIN. PLEASE NOTIFY THE MICROBIOLOGY DEPARTMENT WITHIN ONE WEEK IF SPECIATION AND SENSITIVITIES ARE REQUIRED. Performed at [facility], [place] [address] [place],  [address]    Report Status 02/03/2020 FINAL  Final  Blood Culture ID Panel (Reflexed)     Status: Abnormal   Collection Time: 01/31/20  9:19 AM  Result Value Ref Range Status  Enterococcus faecalis NOT DETECTED NOT DETECTED Final   Enterococcus Faecium NOT DETECTED NOT DETECTED Final   Listeria monocytogenes NOT DETECTED NOT DETECTED Final   Staphylococcus species DETECTED (A) NOT DETECTED Final    Comment: CRITICAL RESULT CALLED TO, READ BACK BY AND VERIFIED WITH: SCOTT HALL PHARMD [id number] 02/01/20 HNM    Staphylococcus aureus (BCID) NOT DETECTED NOT DETECTED Final   Staphylococcus epidermidis DETECTED (A) NOT DETECTED Final    Comment: CRITICAL RESULT CALLED TO, READ BACK BY AND VERIFIED WITH: SCOTT HALL PHARMD 0403 02/01/20 HNM    Staphylococcus lugdunensis NOT DETECTED NOT DETECTED Final   Streptococcus species NOT DETECTED NOT DETECTED Final   Streptococcus agalactiae NOT DETECTED NOT DETECTED Final   Streptococcus pneumoniae NOT DETECTED NOT DETECTED Final   Streptococcus pyogenes NOT DETECTED NOT DETECTED Final    A.calcoaceticus-baumannii NOT DETECTED NOT DETECTED Final   Bacteroides fragilis NOT DETECTED NOT DETECTED Final   Enterobacterales NOT DETECTED NOT DETECTED Final   Enterobacter cloacae complex NOT DETECTED NOT DETECTED Final   Escherichia coli NOT DETECTED NOT DETECTED Final   Klebsiella aerogenes NOT DETECTED NOT DETECTED Final   Klebsiella oxytoca NOT DETECTED NOT DETECTED Final   Klebsiella pneumoniae NOT DETECTED NOT DETECTED Final   Proteus species NOT DETECTED NOT DETECTED Final   Salmonella species NOT DETECTED NOT DETECTED Final   Serratia marcescens NOT DETECTED NOT DETECTED Final   Haemophilus influenzae NOT DETECTED NOT DETECTED Final   Neisseria meningitidis NOT DETECTED NOT DETECTED Final   Pseudomonas aeruginosa NOT DETECTED NOT DETECTED Final   Stenotrophomonas maltophilia NOT DETECTED NOT DETECTED Final   Candida albicans NOT DETECTED NOT DETECTED Final   Candida auris NOT DETECTED NOT DETECTED Final   Candida glabrata NOT DETECTED NOT DETECTED Final   Candida krusei NOT DETECTED NOT DETECTED Final   Candida parapsilosis NOT DETECTED NOT DETECTED Final   Candida tropicalis NOT DETECTED NOT DETECTED Final   Cryptococcus neoformans/gattii NOT DETECTED NOT DETECTED Final   Methicillin resistance mecA/C NOT DETECTED NOT DETECTED Final    Comment: Performed at [facility], [address] [place], [place] [address]     Discharge Instructions:   Discharge Instructions    Diet general   Complete by: As directed    Discharge instructions   Complete by: As directed    Apply ice pack to left hand as needed. Return to the emergency department if you develop fever, trouble breathing, chest pain, or any other symptoms or swelling or tenderness in the left hand worsens.  Avoid IV amphetamine, alcohol, tobacco products or any illicit drugs   Increase activity slowly   Complete by: As directed      Allergies as of 02/03/2020   Not on File     Medication List     STOP taking these medications   hydrOXYzine 25 MG tablet Commonly known as: ATARAX/VISTARIL     TAKE these medications   baclofen 10 MG tablet Commonly known as: LIORESAL Take 1 tablet (10 mg total) by mouth 3 (three) times daily as needed for muscle spasms.   DULoxetine 30 MG capsule Commonly known as: CYMBALTA Take 3 capsules (90 mg total) by mouth daily.   gabapentin 400 MG capsule Commonly known as: NEURONTIN Take 3 capsules (1,200 mg total) by mouth 3 (three) times daily.   ibuprofen 600 MG tablet Commonly known as: ADVIL Take 1 tablet (600 mg total) by mouth every 8 (eight) hours as needed for up to 3 days.   mirtazapine  7.5 MG tablet Commonly known as: REMERON Take 1 tablet (7.5 mg total) by mouth at bedtime.   Suboxone 8-2 MG Film Generic drug: Buprenorphine HCl-Naloxone HCl Place 1 Film under the tongue 3 (three) times daily.       Follow-up Information    Malachy Mood, MD Follow up in 1 week(s).   Specialty: Obstetrics and Gynecology Why: preop consent/counseling and hospital follow up Contact information: [address] [place] [address] [phone]                Time coordinating discharge: 35 minutes  Signed:  [facility] 02/03/2020, 9:03 PM   Pager on [email or web address]. If 7PM-7AM, please contact night-coverage at [email or web address]

## 2020-02-05 LAB — CULTURE, BLOOD (ROUTINE X 2): Culture: NO GROWTH

## 2020-06-01 DEATH — deceased
